# Patient Record
Sex: Male | Born: 1970 | Race: White | Hispanic: No | Marital: Married | Smoking: Former smoker
Health system: Southern US, Community
[De-identification: ages and names within clinical notes are randomized; demographics above are authoritative.]

## PROBLEM LIST (undated history)

## (undated) ENCOUNTER — Emergency Department (HOSPITAL_COMMUNITY): Admission: EM | Payer: Self-pay | Source: Home / Self Care

## (undated) DIAGNOSIS — F32A Depression, unspecified: Secondary | ICD-10-CM

## (undated) DIAGNOSIS — F329 Major depressive disorder, single episode, unspecified: Secondary | ICD-10-CM

## (undated) DIAGNOSIS — Z9861 Coronary angioplasty status: Secondary | ICD-10-CM

## (undated) DIAGNOSIS — I10 Essential (primary) hypertension: Secondary | ICD-10-CM

## (undated) DIAGNOSIS — I214 Non-ST elevation (NSTEMI) myocardial infarction: Secondary | ICD-10-CM

## (undated) DIAGNOSIS — G4730 Sleep apnea, unspecified: Secondary | ICD-10-CM

## (undated) DIAGNOSIS — E669 Obesity, unspecified: Secondary | ICD-10-CM

## (undated) DIAGNOSIS — Z87442 Personal history of urinary calculi: Secondary | ICD-10-CM

## (undated) DIAGNOSIS — R519 Headache, unspecified: Secondary | ICD-10-CM

## (undated) DIAGNOSIS — IMO0002 Reserved for concepts with insufficient information to code with codable children: Secondary | ICD-10-CM

## (undated) DIAGNOSIS — R0683 Snoring: Secondary | ICD-10-CM

## (undated) DIAGNOSIS — J849 Interstitial pulmonary disease, unspecified: Secondary | ICD-10-CM

## (undated) DIAGNOSIS — G2581 Restless legs syndrome: Secondary | ICD-10-CM

## (undated) DIAGNOSIS — I255 Ischemic cardiomyopathy: Secondary | ICD-10-CM

## (undated) DIAGNOSIS — F419 Anxiety disorder, unspecified: Secondary | ICD-10-CM

## (undated) DIAGNOSIS — K219 Gastro-esophageal reflux disease without esophagitis: Secondary | ICD-10-CM

## (undated) DIAGNOSIS — I2510 Atherosclerotic heart disease of native coronary artery without angina pectoris: Secondary | ICD-10-CM

## (undated) DIAGNOSIS — R51 Headache: Secondary | ICD-10-CM

## (undated) DIAGNOSIS — E785 Hyperlipidemia, unspecified: Secondary | ICD-10-CM

## (undated) DIAGNOSIS — M069 Rheumatoid arthritis, unspecified: Secondary | ICD-10-CM

## (undated) HISTORY — DX: Restless legs syndrome: G25.81

## (undated) HISTORY — PX: CHOLECYSTECTOMY: SHX55

## (undated) HISTORY — PX: TRANSTHORACIC ECHOCARDIOGRAM: SHX275

## (undated) HISTORY — DX: Snoring: R06.83

## (undated) HISTORY — PX: THROAT SURGERY: SHX803

## (undated) HISTORY — PX: GALLBLADDER SURGERY: SHX652

---

## 2000-10-25 ENCOUNTER — Encounter: Payer: Self-pay | Admitting: Internal Medicine

## 2000-10-25 ENCOUNTER — Ambulatory Visit (HOSPITAL_COMMUNITY): Admission: RE | Admit: 2000-10-25 | Discharge: 2000-10-25 | Payer: Self-pay | Admitting: Internal Medicine

## 2000-10-27 ENCOUNTER — Encounter: Payer: Self-pay | Admitting: Internal Medicine

## 2000-10-27 ENCOUNTER — Emergency Department (HOSPITAL_COMMUNITY): Admission: EM | Admit: 2000-10-27 | Discharge: 2000-10-27 | Payer: Self-pay | Admitting: *Deleted

## 2000-10-27 ENCOUNTER — Ambulatory Visit (HOSPITAL_COMMUNITY): Admission: RE | Admit: 2000-10-27 | Discharge: 2000-10-27 | Payer: Self-pay | Admitting: Internal Medicine

## 2000-10-28 ENCOUNTER — Ambulatory Visit (HOSPITAL_COMMUNITY): Admission: RE | Admit: 2000-10-28 | Discharge: 2000-10-28 | Payer: Self-pay | Admitting: Internal Medicine

## 2000-10-28 ENCOUNTER — Encounter: Payer: Self-pay | Admitting: Internal Medicine

## 2000-10-31 ENCOUNTER — Ambulatory Visit (HOSPITAL_COMMUNITY): Admission: RE | Admit: 2000-10-31 | Discharge: 2000-10-31 | Payer: Self-pay | Admitting: General Surgery

## 2003-12-02 ENCOUNTER — Ambulatory Visit (HOSPITAL_COMMUNITY): Admission: RE | Admit: 2003-12-02 | Discharge: 2003-12-02 | Payer: Self-pay | Admitting: Family Medicine

## 2003-12-18 ENCOUNTER — Ambulatory Visit (HOSPITAL_COMMUNITY): Admission: RE | Admit: 2003-12-18 | Discharge: 2003-12-18 | Payer: Self-pay | Admitting: Internal Medicine

## 2005-11-06 ENCOUNTER — Emergency Department (HOSPITAL_COMMUNITY): Admission: EM | Admit: 2005-11-06 | Discharge: 2005-11-06 | Payer: Self-pay | Admitting: Emergency Medicine

## 2005-11-09 ENCOUNTER — Emergency Department (HOSPITAL_COMMUNITY): Admission: EM | Admit: 2005-11-09 | Discharge: 2005-11-09 | Payer: Self-pay | Admitting: Emergency Medicine

## 2005-11-12 ENCOUNTER — Ambulatory Visit (HOSPITAL_COMMUNITY): Admission: RE | Admit: 2005-11-12 | Discharge: 2005-11-12 | Payer: Self-pay | Admitting: Urology

## 2006-11-21 ENCOUNTER — Ambulatory Visit (HOSPITAL_COMMUNITY): Admission: RE | Admit: 2006-11-21 | Discharge: 2006-11-21 | Payer: Self-pay | Admitting: Family Medicine

## 2007-02-17 ENCOUNTER — Emergency Department (HOSPITAL_COMMUNITY): Admission: EM | Admit: 2007-02-17 | Discharge: 2007-02-17 | Payer: Self-pay | Admitting: Emergency Medicine

## 2009-09-16 ENCOUNTER — Ambulatory Visit: Payer: Self-pay | Admitting: Orthopedic Surgery

## 2009-09-16 DIAGNOSIS — IMO0002 Reserved for concepts with insufficient information to code with codable children: Secondary | ICD-10-CM

## 2009-09-16 DIAGNOSIS — M549 Dorsalgia, unspecified: Secondary | ICD-10-CM | POA: Insufficient documentation

## 2009-09-16 DIAGNOSIS — M5124 Other intervertebral disc displacement, thoracic region: Secondary | ICD-10-CM | POA: Insufficient documentation

## 2009-09-16 HISTORY — DX: Reserved for concepts with insufficient information to code with codable children: IMO0002

## 2009-09-18 ENCOUNTER — Telehealth: Payer: Self-pay | Admitting: Orthopedic Surgery

## 2009-09-19 ENCOUNTER — Ambulatory Visit (HOSPITAL_COMMUNITY): Admission: RE | Admit: 2009-09-19 | Discharge: 2009-09-19 | Payer: Self-pay | Admitting: Orthopedic Surgery

## 2009-09-22 ENCOUNTER — Encounter: Payer: Self-pay | Admitting: Orthopedic Surgery

## 2009-09-22 ENCOUNTER — Telehealth: Payer: Self-pay | Admitting: Orthopedic Surgery

## 2009-09-24 ENCOUNTER — Ambulatory Visit: Payer: Self-pay | Admitting: Orthopedic Surgery

## 2010-02-14 ENCOUNTER — Encounter: Payer: Self-pay | Admitting: Internal Medicine

## 2010-02-24 NOTE — Assessment & Plan Note (Signed)
Summary: REVIEW MRI RESULTS/APH/PT BRING'G FILM/PCHA/CAF   Visit Type:  Follow-up  CC:  back pain.  History of Present Illness: I saw Richard Simpson in the office today for a followup visit.  He is a 40 years old man with the complaint of:  back pain.  Medications: 1)  Ibuprofen 800 Mg Tabs (Ibuprofen) .... One by mouth three times a day [not taking right now] 2)  Robaxin 500 Mg Tabs (Methocarbamol) .Marland Kitchen.. 1 by mouth at hour of sleep 3)  Ultracet 37.5-325 Mg Tabs (Tramadol-acetaminophen) .... One by mouth q 4 hrs as needed pain   MRI results:  IMPRESSION:   1.  And decreased to size and conspicuity of a right lateral disc protrusion at T11-12. 2.  Stable appearance of Schmorl's nodes throughout the lower thoracic spine. 3.  No other focal disc herniation or stenosis.   Read By:  Chauncey Fischer,  MD      After reviewing this MRI I spoke with this patient regarding his back pain which appears to be nonsurgical.  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 One by mouth tid    Robaxin 500 Mg Tabs (Methocarbamol) .Marland Kitchen... 1 by mouth at hour of sleep    Ultracet 37.5-325 Mg Tabs (Tramadol-acetaminophen) ..... One by mouth q 4 hrs as needed pain  Orders: Est. 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 Patient Level II (98119)  Patient Instructions: 1)  ADD EXERCISES FOR THE BACK  2)  TAKE MEDS AS DISCUSSED F/U AS NEEDED

## 2010-02-24 NOTE — Assessment & Plan Note (Signed)
Summary: EVAL SPINE/?XRAYS/PER DR Lorie Melichar/CAF   Vital Signs:  Patient profile:   40 year old male Height:      73 inches Weight:      245 pounds BMI:     32.44 Pulse rate:   72 / minute  Visit Type:  new patient  CC:  back pain.  History of Present Illness: I saw Richard Simpson in the office today for an initial visit.  He is a 40 years old man with the complaint of:  back pain.  Xrays today.  Medications: Artotec, Ibuprofen 800 mg.  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  The patient did well until about a month and a half ago when he started having gradual onset of constant throbbing sharp pain in his back which he rates as 6/10.  He notes now that he can no longer live weights.  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  He takes Arthrotec for arthritis he'll also supplement this with "a lot of ibuprofen in "as needed to relieve his back pain.    The pain is primarily in the mid to lower thoracic spine and is worsened with flexion as well as extension and he will often feel a pop when he flexes past 90.he has not had any numbness or tingling in his chest wall or abdomen.  he denies weakness.  Allergies (verified): No Known Drug Allergies  Past History:  Past Medical History: Arthritis Back   Past Surgical History: Cholecystectomy Polyps on vocal cord  Review of Systems Constitutional:  Denies weight loss, weight gain, fever, chills, and fatigue. Cardiovascular:  Denies chest pain, palpitations, fainting, and murmurs. Respiratory:  Denies short of breath, wheezing, couch, tightness, pain on inspiration, and snoring . Gastrointestinal:  Denies heartburn, nausea, vomiting, diarrhea, constipation, and blood in your  stools. Genitourinary:  Denies frequency, urgency, difficulty urinating, painful urination, flank pain, and bleeding in urine. Neurologic:  Denies numbness, tingling, unsteady gait, dizziness, tremors, and seizure. Musculoskeletal:  Complains of joint pain, swelling, and stiffness; denies instability, redness, heat, and muscle pain. Endocrine:  Denies excessive thirst, exessive urination, and heat or cold intolerance. Psychiatric:  Denies nervousness, depression, anxiety, and hallucinations. Skin:  Denies changes in the skin, poor healing, rash, itching, and redness. HEENT:  Denies blurred or double vision, eye pain, redness, and watering. Immunology:  Complains of seasonal allergies; denies sinus problems and allergic to bee stings. Hemoatologic:  Denies easy bleeding and brusing.  Physical Exam  Additional Exam:  6 feet 1 inch tall 245 pounds with a pulse of 72  He is a well-developed well-nourished muscular male with no gross deformities  He has no peripheral edema and normal radial and ulnar pulses in each upper extremity  There is no cervical lymphadenopathy  Skin is normal in both upper extremities as well as the head and neck region cervical and thoracic spine.  He has no sensory deficits in the chest wall is normal abdominal reflex.  He has normal upper extremity reflexes.  He has normal lower extremity reflexes.  Toes are downgoing.  There are no pathologic reflexes upper or lower extremities.  He ambulates normally.  In terms of his spinal alignment it seems normal in the sagittal and  coronal plane.  Has no tenderness in his neck has no tenderness in his lower back but he does have tenderness in the lower and mid thoracic spine around the midline RIGHT on the bone and in the spinous spaces.  He has no paraspinal muscle tenderness.  Although he can flex and extend the spine well he has pain with extension as well as popping sensation when he flexes.  He also has pain with  rotation.  He has no pain with lateral flexion.     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 One by mouth tid 2)  Robaxin 500 Mg Tabs (Methocarbamol) .Marland Kitchen.. 1 by mouth at hour of sleep 3)  Ultracet 37.5-325 Mg Tabs (Tramadol-acetaminophen) .... One by mouth q 4 hrs as needed pain  Patient Instructions: 1)  Take new meds 2)  MRI return with results Prescriptions: ULTRACET 37.5-325 MG TABS (TRAMADOL-ACETAMINOPHEN) one by mouth q 4 hrs as needed pain  #60 x 2   Entered and Authorized by:   Fuller Canada MD   Signed by:   Fuller Canada MD on 09/16/2009   Method used:   Faxed to ...       [facility] (retail)       [address]       [place], [place]  [address]       Ph: [phone]       Fax: [phone]   RxID:   [id number] ROBAXIN 500 MG TABS (METHOCARBAMOL) 1 by mouth at hour of sleep  #60 x 1   Entered and Authorized by:   Fuller Canada MD   Signed by:   Fuller Canada MD on 09/16/2009   Method used:   Faxed to ...       [facility] (retail)       [address]       [place], [place]  [address]       Ph: [phone]       Fax: [phone]   RxID:    [id number] IBUPROFEN 800 MG TABS (IBUPROFEN) one by mouth tid  #90 x 2   Entered and Authorized by:   Fuller Canada MD   Signed by:   Fuller Canada MD on 09/16/2009   Method used:   Faxed to ...       [facility] (retail)       [address]       [place], [place]  [address]       Ph: [phone]       Fax: [phone]   RxID:   [id number]

## 2010-02-24 NOTE — Progress Notes (Signed)
Summary: MRI appointment.  Phone Note Outgoing Call   Call placed by: Waldon Reining,  September 18, 2009 2:29 PM Call placed to: Patient Action Taken: Appt scheduled Summary of Call: I called and left a message with the patient's MRI appointment at [facility] on 09-19-09 at 1:30. Patient has [employer], no precert is needed. Patient will follow up here for his results.

## 2010-02-24 NOTE — Letter (Signed)
Summary: History form  Historya form   Imported By: Jacklynn Ganong 09/18/2009 15:30:49  _____________________________________________________________________  External Attachment:    Type:   Image     Comment:   External Document

## 2010-02-24 NOTE — Progress Notes (Signed)
Summary: Work note requested for ret to work today  Phone Note Call from Patient   Caller: Patient Summary of Call: Patient states his employer, [employer], is requesting that he have a work note for today, indicating that he may drive & perform regular duty on the medication prescribed at visit 09/16/09.  States he is having no side effects.  Note typed - please review.  His cell ph#: [phone] Initial call taken by: Cammie Sickle,  September 22, 2009 4:58 PM  Follow-up for Phone Call        Patient states was advised by employer to hold on returning to work on 09/22/09 as scheduled, as needs note.  Follow-up by: Cammie Sickle,  September 23, 2009 10:51 AM  Additional Follow-up for Phone Call Additional follow up Details #1::        ok to give note  Additional Follow-up by: Fuller Canada MD,  September 23, 2009 2:17 PM    Additional Follow-up for Phone Call Additional follow up Details #2::    called patient, advised.  Picking up note today, and will be here as scheduled for appt tomorrow. Follow-up by: Cammie Sickle,  September 23, 2009 3:05 PM

## 2010-02-24 NOTE — Letter (Signed)
Summary: [facility]  [address]  [place], [place] [address]   Phone: [phone]  Fax: [phone]       09/22/2009  RE: Richard Simpson. Cordial     DOB 07/04/2070    Dear Mr. 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      Sincerely,   Terrance Mass, MD

## 2010-06-12 NOTE — Op Note (Signed)
[facility]  Patient:    Richard Simpson, Richard Simpson Visit Number: [id number] MRN: [id number]          Service Type: DSU Location: DAY Attending Physician:  Dalia Heading Dictated by:   Franky Macho, M.D. Proc. Date: 10/31/00 Admit Date:  10/31/2000   CC:         Elfredia Nevins, M.D.  Roetta Sessions, M.D.   Operative Report  AGE:  41 years old.  PREOPERATIVE DIAGNOSIS:  Chronic cholecystitis.  POSTOPERATIVE DIAGNOSIS:  Chronic cholecystitis.  PROCEDURE:  Laparoscopic cholecystectomy.  SURGEON:  Franky Macho, M.D.  ASSISTANT:  Arna Snipe, M.D.  ANESTHESIA:  General endotracheal.  INDICATIONS:  Patient is a 40 year old white male who presents with right upper quadrant abdominal pain, nausea, vomiting, and biliary colic secondary to chronic cholecystitis.  The risks and benefits of the procedure including bleeding, infection, hepatobiliary injury, and the possibility of an open procedure were fully explained to the patient, who gave informed consent.  DESCRIPTION OF PROCEDURE:  Patient was placed in the supine position.  After induction of general endotracheal anesthesia, the abdomen was prepped and draped using the usual sterile technique with Betadine.  A supraumbilical incision was made down to the fascia.  Veress needle was introduced into the abdominal cavity and confirmation of placement was done using the saline drop test.  The abdomen was then insufflated to 16 mmHg pressure.  An 11-mm trocar was introduced into the abdominal cavity under direct visualization without difficulty.  The patient was placed in reversed Trendelenburg position and an additional 11-mm trocar was placed in the epigastric region and 5-mm trocars were placed in the right upper quadrant and right flank regions.  Liver was inspected and noted to be within normal limits.  The gallbladder was retracted superiorly and laterally.  The dissection was begun  around the infundibulum of the gallbladder.  The cystic duct was first identified.  It was noted to be significantly narrowed and tortuous.  Endo Clips were placed proximally and distally on the cystic duct and the cystic duct was divided.  This was likewise done to the cystic artery. The gallbladder was then freed away from the gallbladder fossa using Bovie electrocautery.  The gallbladder was delivered through the epigastric trocar site using an EndoCatch without difficulty.  The gallbladder fossa was inspected and no abnormal bleeding or bile leakage were noted.  Surgicel was placed in the gallbladder fossa.  Subhepatic space as well as right hepatic gutter were irrigated with normal saline.  All fluid and air were then evacuated from the abdominal cavity prior to removal of the trocars.  All wounds were irrigated with normal saline.  All wounds were injected with 0.5% Marcaine.  The supraumbilical fascia was reapproximated using an 0 Vicryl interrupted suture.  All skin incisions were closed using staples.  Betadine ointment and dry sterile dressings were applied.  All tape and needle counts were correct at the end of the procedure.  The patient was extubated in the operating room and went back to recovery room awake, in stable condition.  COMPLICATIONS:  None.  SPECIMEN:  Gallbladder.  BLOOD LOSS:  Minimal. Dictated by:   Franky Macho, M.D. Attending Physician:  Dalia Heading DD:  10/31/00 TD:  11/01/00 Job: [id number]

## 2010-06-12 NOTE — Op Note (Signed)
[facility]  Patient:    Simpson, Richard Visit Number: [id number] MRN: [id number]          Service Type: END Location: DAY Attending Physician:  Jonathon Bellows Dictated by:   Roetta Sessions, M.D. Proc. Date: 10/28/00 Admit Date:  10/28/2000 Discharge Date: 10/28/2000   CC:         Elfredia Nevins, M.D.  Franky Macho, M.D.   Operative Report  PROCEDURE:  Esophagogastroduodenoscopy with biopsy.  ENDOSCOPIST:  Roetta Sessions, M.D.  INDICATION FOR PROCEDURE:  Patient is a 40 year old Caucasian male with a one-week history of right upper quadrant abdominal pain radiating into his back.  He reportedly has had fevers at home to 102.  He has not responded to antibiotics empirically.  Laboratory evaluation essentially unremarkable except for a minimal bandemia with 12 bands, with a total white count of 3.7. Liver profile showed minimal elevation of the transaminases, less than 25 points above normal.  Acute abdominal series, ultrasound, CT of the chest and abdomen as well as HIDA with a fatty meal challenge, all were within normal limits during the past week.  He has localized tenderness overlying his gallbladder on physical examination, although there is no other objective evidence of cholecystitis.  He has been taking some Advil recently for his right upper quadrant pain.  As discussed with Dr. Elfredia Nevins prior to asking a surgeon to see him, we will go ahead and survey his upper GI tract just to make sure he does not have an occult duodenal bulbar ulcer or some other process which may be contributing to the clinical picture.  I have discussed this approach with him and his wife and the potential risks, benefits and alternatives have been reviewed and questions answered; they are agreeable.  Please see my handwritten H&P for more information.  PROCEDURE NOTE:  O2 saturation, blood pressure, pulse and respirations were monitored throughout the  entire procedure.  CONSCIOUS SEDATION:  Versed 4 mg IV, Demerol 100 mg IV in divided doses, cetacaine spray for topical oropharynx anesthesia.  INSTRUMENT:  Olympus video chip gastroscope.  FINDINGS:  Examination of the tubular esophagus revealed no mucosal abnormalities.  The EG junction was easily traversed and on entering the stomach, the gastric cavity had a thin coating of residual CT contrast from yesterday, otherwise, it was empty and insufflated well with air.  Thorough examination of the gastric mucosa including a retroflexed view of the proximal stomach and esophagogastric junction demonstrated no abnormalities aside from a few antral erosions.  The CT contrast was washed off fairly easily.  The mucosa was well-seen.  Pyloric channel was patent and easily traversed.  Duodenum:  Examination of the bulb, second and third portions of the duodenum revealed a couple of 5- to 6-mm adenomatous-appearing nodules on the folds which were biopsied.  The remainder of the duodenum appeared normal.  There was no ulcer.  Subsequently, antral biopsies x 2 for CLOtesting were obtained. The patient tolerated the procedure well and was reactive in endoscopy.  IMPRESSION: 1. Normal esophagus. 2. Antral erosions and residual CT contrast in the stomach.  Remainder of    gastric mucosa appeared normal.  CLO biopsy taken. 3. D1, D2, D3 well seen, a couple of adenomatous-appearing nodules in    D3, otherwise, unremarkable upper gastrointestinal tract, biopsies of the    duodenal nodule taken, CLO biopsies taken.  Todays findings do not explain the patients right upper quadrant pain.  I suspect he does have gallbladder disease.  I have discussed the case with Dr. Franky Macho; he will see Mr. Joshua in the very near future.  If he comes to cholecystectomy, I have asked to Dr. Lovell Sheehan to please do a liver biopsy while he is there.  I would like to thank Dr. Artis Delay for allowing me to see  this very nice man today in consultation. Dictated by:   Roetta Sessions, M.D. Attending Physician:  Jonathon Bellows DD:  10/28/00 TD:  10/29/00 Job: [id number]

## 2010-06-12 NOTE — H&P (Signed)
[facility]  Patient:    Richard Simpson, Richard Simpson Visit Number: [id number] MRN: [id number]          Service Type: END Location: DAY Attending Physician:  Jonathon Bellows Dictated by:   Franky Macho, M.D. Admit Date:  10/28/2000 Discharge Date: 10/28/2000   CC:         Roetta Sessions, M.D.  Elfredia Nevins, M.D.   History and Physical  AGE:  40 years old.  CHIEF COMPLAINT:  Chronic cholecystitis.  HISTORY OF PRESENT ILLNESS:  The patient is a 40 year old white male who is referred for evaluation and treatment of right upper quadrant abdominal pain and nausea.  It has been occurring over the past week.  He has been having right upper quadrant abdominal pain with radiation to the right flank, nausea, bloating, fatty food intolerance, and occasional emesis.  A mild fever was noted earlier in the week, but this has since resolved.  No jaundice has been noted.  He had an ultrasound of the gallbladder which was negative except for some mild thickening of the gallbladder wall.  CT scan of the abdomen and pelvis was negative.  Hepatobiliary scan showed approximately 60% ejection fraction but reproducible pain symptoms with a fatty meal.  EGD done by Dr. Jena Gauss yesterday was, for the most part, unremarkable.  PAST MEDICAL HISTORY:  Unremarkable.  PAST SURGICAL HISTORY: 1. Wisdom teeth removal. 2. Nodules from throat removed as a child.  CURRENT MEDICATIONS:  An antibiotic, Tylox.  ALLERGIES:  No known drug allergies.  SOCIAL HISTORY:  The patient smokes one-half pack of cigarettes a day.  He denies significant alcohol use.  REVIEW OF SYSTEMS:  He denies any other cardiopulmonary difficulties.  PHYSICAL EXAMINATION:  GENERAL:  Well-developed, well-nourished white male in no acute distress.  HEENT:  No scleral icterus.  LUNGS:  Clear to auscultation, with equal breath sounds bilaterally.  HEART:  Regular rate and rhythm without S3, S4, or  murmurs.  ABDOMEN:  Soft, with tenderness noted in the right upper quadrant to palpation.  No hepatosplenomegaly, masses, or hernias are identified.  IMPRESSION:  Biliary colic secondary to chronic cholecystitis.  PLAN:  The patient is scheduled for laparoscopic cholecystectomy on November 10, 2000.  The risks and benefits of the procedure including bleeding, infection, hepatobiliary injury, and the possibility of an open procedure were fully explained to the patient, who gave informed consent. Dictated by:   Franky Macho, M.D. Attending Physician:  Jonathon Bellows DD:  10/28/00 TD:  10/30/00 Job: [id number]

## 2010-09-02 ENCOUNTER — Encounter: Payer: Self-pay | Admitting: Orthopedic Surgery

## 2010-09-02 ENCOUNTER — Ambulatory Visit (INDEPENDENT_AMBULATORY_CARE_PROVIDER_SITE_OTHER): Payer: PRIVATE HEALTH INSURANCE | Admitting: Orthopedic Surgery

## 2010-09-02 ENCOUNTER — Other Ambulatory Visit: Payer: Self-pay

## 2010-09-02 VITALS — Resp 16 | Ht 73.0 in | Wt 258.0 lb

## 2010-09-02 DIAGNOSIS — R2232 Localized swelling, mass and lump, left upper limb: Secondary | ICD-10-CM

## 2010-09-02 DIAGNOSIS — R229 Localized swelling, mass and lump, unspecified: Secondary | ICD-10-CM

## 2010-09-02 NOTE — H&P (Signed)
Richard Simpson is an 40 y.o. male.   Richard Simpson  Description:  40 year old male  09/02/2010 10:30 AM Office Visit Provider:  Fuller Canada, MD  MRN: [id number] Department:  [facility]            Diagnoses  Reason for Visit    Mass of finger of left hand - Primary  Ganglion Cyst   782.2  New problem, left middle finger cyst, xrays today, refered from Dr. Franky Macho.           Current Vitals       Recorded  User         09/02/2010 10:35 AM  Chasity Pricilla Holm, LPN          BP  Pulse  Temp (Src)  Resp  Ht  Wt    N/A  N/A  N/A (N/A)  16  6\' 1"  (1.854 m)  258 lb (117.028 kg)        BMI  SpO2  PF    34.04 kg/m2  N/A  N/A                Progress Notes     Fuller Canada, MD 09/02/2010 10:57 AM Signed  Est. Patient new problem  The patient has a mass on the LEFT long finger. The mass came on gradually over the last 2 years, there was no associated injury. There is sharp dull painful sensations running through the finger associated with 4/10 pain that is constant and worse when he is using his hands are working out. There is swelling as well.  Review of systems he has joint pain swelling and stiffness and seasonal ALLERGIES there's been no weight loss numbness or tingling in the digits. Past history of arthritis and cholecystectomy.  Physical Exam(12)  GENERAL: normal development  CDV: pulses are normal  Skin: normal  Lymph: nodes were not palpable/normal  Psychiatric: awake, alert and oriented  Neuro: normal sensation  MSK Examination of the LEFT hand and specifically the LEFT long finger alignment is normal  1There is no tenderness to palpation, the mass is firm subcutaneous proximally x 1 cm in dimensions  2 Flexion is normal  3 Strength in the finger is normal  4 Normal sensation as noted in the finger  5 The mass is located over the middle phalanx on the ulnar side  6 The joints are stable  Assessment: Ganglion type cyst vs. Synovial cyst  LEFT long finger  X-ray was obtained it showed no bony involvement of soft tissue swelling over the area in question  The patient would like the mass removed because it is causing him pain and difficulty using his hands  Informed consent has been given verbally, he agrees to surgery with minimal risks of infection stiffness numbness and tingling of the digit  Separate identifiable x-ray report 3 views of the LEFT long finger  Reason for x-ray Mass LEFT long finger and pain LEFT long finger  Findings: Soft tissue swelling is noted over the mass no bony involvement arthritis is seen  Impression soft tissue swelling over Mass LEFT long finger with no bony involvement  Plan: Excision of mass LEFT long finger     Past Medical History  Diagnosis Date  . Arthritis     Past Surgical History  Procedure Date  . Gallbladder surgery     Family History  Problem Relation Age of Onset  . Heart disease    . Arthritis    .  Cancer     Social History:  reports that he has never smoked. He does not have any smokeless tobacco history on file. He reports that he does not drink alcohol or use illicit drugs.  Allergies: No Known Allergies  No current outpatient prescriptions on file as of 09/02/2010.   No current facility-administered medications on file as of 09/02/2010.    No results found for this or any previous visit (from the past 48 hour(s)). @[id number] @  ROS  Resp. rate 16, height 6\' 1"  (1.854 m), weight 258 lb (117.028 kg). Physical Exam   Assessment/Plan LLF excision of mass   Fuller Canada 09/02/2010, 11:56 AM

## 2010-09-02 NOTE — Progress Notes (Signed)
   Est. Patient new problem  The patient has a mass on the LEFT long finger.  The mass came on gradually over the last 2 years, there was no associated injury.  There is sharp dull painful sensations running through the finger associated with 4/10 pain that is constant and worse when he is using his hands are working out.  There is swelling as well.  Review of systems he has joint pain swelling and stiffness and seasonal ALLERGIES there's been no weight loss numbness or tingling in the digits.  Past history of arthritis and cholecystectomy.  Physical Exam(12) GENERAL: normal development   CDV: pulses are normal   Skin: normal  Lymph: nodes were not palpable/normal  Psychiatric: awake, alert and oriented  Neuro: normal sensation  MSK Examination of the LEFT hand and specifically the LEFT long finger alignment is normal 1There is no tenderness to palpation, the mass is firm subcutaneous proximally x 1 cm in dimensions 2 Flexion is normal 3 Strength in the finger is normal 4 Normal sensation as noted in the finger 5 The mass is located over the middle phalanx on the ulnar side 6 The joints are stable  Assessment: Ganglion type cyst vs. Synovial cyst LEFT long finger  X-ray was obtained it showed no bony involvement of soft tissue swelling over the area in question  The patient would like the mass removed because it is causing him pain and difficulty using his hands  Informed consent has been given verbally, he agrees to surgery with minimal risks of infection stiffness numbness and tingling of the digit  Separate identifiable x-ray report 3 views of the LEFT long finger Reason for x-ray Mass LEFT long finger and pain LEFT long finger Findings: Soft tissue swelling is noted over the mass no bony involvement arthritis is seen  Impression soft tissue swelling over Mass LEFT long finger with no bony involvement    Plan: Excision of mass LEFT long finger

## 2010-09-02 NOTE — Progress Notes (Signed)
Addended by: Fuller Canada MD E on: 09/02/2010 11:58 AM   Modules accepted: Orders

## 2010-09-03 ENCOUNTER — Telehealth: Payer: Self-pay | Admitting: Orthopedic Surgery

## 2010-09-03 NOTE — Telephone Encounter (Signed)
Contacted insurer, [employer] at ph # [phone], (ins.card shows uses [employer] network). Spoke with Steward Drone, who verified that they are the 3rd party administrator for the [facility] plan.  Planned surgery date is 09/09/10 at [facility], CPT [phone], ICD9 782.2.  Verified with Steward Drone that clinicals are needed for a case review.  Fax# [phone] (done).  Cont'd: 09/02/10  Also per Steward Drone - directed to voice mail of authorization representative at same ph# above.  I left voice mail message with all pertinent information and requested a call back.

## 2010-09-04 ENCOUNTER — Encounter (HOSPITAL_COMMUNITY)
Admission: RE | Admit: 2010-09-04 | Discharge: 2010-09-04 | Disposition: A | Discharge status: Home / Self Care | Payer: PRIVATE HEALTH INSURANCE | Source: Ambulatory Visit | Attending: Orthopedic Surgery | Admitting: Orthopedic Surgery

## 2010-09-04 ENCOUNTER — Encounter (HOSPITAL_COMMUNITY): Payer: Self-pay

## 2010-09-04 LAB — SURGICAL PCR SCREEN
MRSA, PCR: NEGATIVE
Staphylococcus aureus: POSITIVE — AB

## 2010-09-04 LAB — CBC
HCT: 44.3 % (ref 39.0–52.0)
Hemoglobin: 15.5 g/dL (ref 13.0–17.0)
MCH: 31.8 pg (ref 26.0–34.0)
MCHC: 35 g/dL (ref 30.0–36.0)
MCV: 91 fL (ref 78.0–100.0)
RBC: 4.87 MIL/uL (ref 4.22–5.81)
RDW: 12.7 % (ref 11.5–15.5)
WBC: 5.2 10*3/uL (ref 4.0–10.5)

## 2010-09-04 LAB — BASIC METABOLIC PANEL
BUN: 17 mg/dL (ref 6–23)
CO2: 22 mEq/L (ref 19–32)
Chloride: 105 mEq/L (ref 96–112)
GFR calc non Af Amer: >60 mL/min (ref >60)
Glucose, Bld: 114 mg/dL — ABNORMAL HIGH (ref 70–99)
Potassium: 4 mEq/L (ref 3.5–5.1)
Sodium: 141 mEq/L (ref 135–145)

## 2010-09-04 MED ORDER — LACTATED RINGERS IV SOLN: INTRAVENOUS | Status: DC

## 2010-09-04 NOTE — Telephone Encounter (Signed)
Received call back from pre-authorization department, Rowan Blase.  Per her review of clinicals and verification over phone, received authorization # [id number], good for date of service, surgery date only, 09/09/10.

## 2010-09-04 NOTE — Patient Instructions (Signed)
20 Richard Simpson  09/04/2010   Your procedure is scheduled on: 09/09/10  Report to [name] at [facility].  Call this number if you have problems the morning of surgery: [phone]   Remember:   Do not eat food:After Midnight.  Do not drink clear liquids: After Midnight.  Take these medicines the morning of surgery with A SIP OF WATER:    Do not wear jewelry, make-up or nail polish.  Do not wear lotions, powders, or perfumes. You may wear deodorant.  Do not shave 48 hours prior to surgery.  Do not bring valuables to the hospital.  Contacts, dentures or bridgework may not be worn into surgery.  Leave suitcase in the car. After surgery it may be brought to your room.  For patients admitted to the hospital, checkout time is 11:00 AM the day of discharge.   Patients discharged the day of surgery will not be allowed to drive home.  Name and phone number of your driver: family  Special Instructions: CHG Shower Use Special Wash: 1/2 bottle night before surgery and 1/2 bottle morning of surgery.   Please read over the following fact sheets that you were given: Pain Booklet, MRSA Information, Surgical Site Infection Prevention, Anesthesia Post-op Instructions and Care and Recovery After Surgery   PATIENT INSTRUCTIONS POST-ANESTHESIA  IMMEDIATELY FOLLOWING SURGERY:  Do not drive or operate machinery for the first twenty four hours after surgery.  Do not make any important decisions for twenty four hours after surgery or while taking narcotic pain medications or sedatives.  If you develop intractable nausea and vomiting or a severe headache please notify your doctor immediately.  FOLLOW-UP:  Please make an appointment with your surgeon as instructed. You do not need to follow up with anesthesia unless specifically instructed to do so.  WOUND CARE INSTRUCTIONS (if applicable):  Keep a dry clean dressing on the anesthesia/puncture wound site if there is drainage.  Once the wound has quit draining you  may leave it open to air.  Generally you should leave the bandage intact for twenty four hours unless there is drainage.  If the epidural site drains for more than 36-48 hours please call the anesthesia department.  QUESTIONS?:  Please feel free to call your physician or the hospital operator if you have any questions, and they will be happy to assist you.     [facility] Anesthesia Department [address] [place] [phone]

## 2010-09-09 ENCOUNTER — Encounter (HOSPITAL_COMMUNITY): Payer: Self-pay | Admitting: Anesthesiology

## 2010-09-09 ENCOUNTER — Other Ambulatory Visit: Payer: Self-pay | Admitting: Orthopedic Surgery

## 2010-09-09 ENCOUNTER — Encounter (HOSPITAL_COMMUNITY): Payer: Self-pay | Admitting: *Deleted

## 2010-09-09 ENCOUNTER — Ambulatory Visit (HOSPITAL_COMMUNITY): Payer: PRIVATE HEALTH INSURANCE | Admitting: Anesthesiology

## 2010-09-09 ENCOUNTER — Ambulatory Visit (HOSPITAL_COMMUNITY)
Admission: RE | Admit: 2010-09-09 | Discharge: 2010-09-09 | Disposition: A | Discharge status: Home / Self Care | Payer: PRIVATE HEALTH INSURANCE | Source: Ambulatory Visit | Attending: Orthopedic Surgery | Admitting: Orthopedic Surgery

## 2010-09-09 ENCOUNTER — Encounter (HOSPITAL_COMMUNITY)
Admission: RE | Disposition: A | Discharge status: Home / Self Care | Payer: Self-pay | Source: Ambulatory Visit | Attending: Orthopedic Surgery

## 2010-09-09 DIAGNOSIS — R2232 Localized swelling, mass and lump, left upper limb: Secondary | ICD-10-CM

## 2010-09-09 DIAGNOSIS — Z01812 Encounter for preprocedural laboratory examination: Secondary | ICD-10-CM | POA: Insufficient documentation

## 2010-09-09 DIAGNOSIS — D2110 Benign neoplasm of connective and other soft tissue of unspecified upper limb, including shoulder: Secondary | ICD-10-CM

## 2010-09-09 HISTORY — PX: MASS EXCISION: SHX2000

## 2010-09-09 SURGERY — EXCISION MASS
Anesthesia: Regional | Site: Finger | Laterality: Left | Wound class: Clean

## 2010-09-09 MED ORDER — LACTATED RINGERS IV SOLN
INTRAVENOUS | Status: DC
  Administered 2010-09-09: 1000 mL via INTRAVENOUS

## 2010-09-09 MED ORDER — ACETAMINOPHEN 500 MG PO TABS
ORAL_TABLET | ORAL | Status: AC
  Administered 2010-09-09: 500 mg via ORAL
  Filled 2010-09-09: qty 1

## 2010-09-09 MED ORDER — ACETAMINOPHEN 500 MG PO TABS
500.0000 mg | ORAL_TABLET | Freq: Once | ORAL | Status: AC
  Administered 2010-09-09: 500 mg via ORAL

## 2010-09-09 MED ORDER — LIDOCAINE HCL (PF) 0.5 % IJ SOLN
INTRAMUSCULAR | Status: AC
  Filled 2010-09-09: qty 50

## 2010-09-09 MED ORDER — MIDAZOLAM HCL 2 MG/2ML IJ SOLN
INTRAMUSCULAR | Status: AC
  Filled 2010-09-09: qty 2

## 2010-09-09 MED ORDER — MIDAZOLAM HCL 5 MG/5ML IJ SOLN
INTRAMUSCULAR | Status: DC | PRN
  Administered 2010-09-09: 2 mg via INTRAVENOUS

## 2010-09-09 MED ORDER — CELECOXIB 100 MG PO CAPS
400.0000 mg | ORAL_CAPSULE | Freq: Once | ORAL | Status: AC
  Administered 2010-09-09: 400 mg via ORAL

## 2010-09-09 MED ORDER — FENTANYL CITRATE 0.05 MG/ML IJ SOLN
INTRAMUSCULAR | Status: AC
  Filled 2010-09-09: qty 2

## 2010-09-09 MED ORDER — MIDAZOLAM HCL 2 MG/2ML IJ SOLN
1.0000 mg | INTRAMUSCULAR | Status: DC | PRN
  Administered 2010-09-09: 2 mg via INTRAVENOUS

## 2010-09-09 MED ORDER — CEFAZOLIN SODIUM 1-5 GM-% IV SOLN
INTRAVENOUS | Status: DC | PRN
  Administered 2010-09-09: 2 g via INTRAVENOUS

## 2010-09-09 MED ORDER — CEFAZOLIN SODIUM-DEXTROSE 2-3 GM-% IV SOLR: 2.0000 g | INTRAVENOUS | Status: DC

## 2010-09-09 MED ORDER — LIDOCAINE HCL (PF) 0.5 % IJ SOLN
INTRAMUSCULAR | Status: DC | PRN
  Administered 2010-09-09: 50 mL

## 2010-09-09 MED ORDER — LIDOCAINE HCL (PF) 1 % IJ SOLN
INTRAMUSCULAR | Status: AC
  Filled 2010-09-09: qty 5

## 2010-09-09 MED ORDER — HYDROCODONE-ACETAMINOPHEN 5-325 MG PO TABS
ORAL_TABLET | ORAL | Status: AC
  Administered 2010-09-09: 1 via ORAL
  Filled 2010-09-09: qty 1

## 2010-09-09 MED ORDER — BUPIVACAINE HCL (PF) 0.5 % IJ SOLN
INTRAMUSCULAR | Status: AC
  Filled 2010-09-09: qty 30

## 2010-09-09 MED ORDER — PROPOFOL 10 MG/ML IV EMUL
INTRAVENOUS | Status: AC
  Filled 2010-09-09: qty 20

## 2010-09-09 MED ORDER — FENTANYL CITRATE 0.05 MG/ML IJ SOLN
INTRAMUSCULAR | Status: DC | PRN
  Administered 2010-09-09 (×2): 50 ug via INTRAVENOUS

## 2010-09-09 MED ORDER — BUPIVACAINE HCL (PF) 0.5 % IJ SOLN
INTRAMUSCULAR | Status: DC | PRN
  Administered 2010-09-09: 10 mL

## 2010-09-09 MED ORDER — HYDROCODONE-ACETAMINOPHEN 5-325 MG PO TABS
1.0000 | ORAL_TABLET | Freq: Once | ORAL | Status: AC
  Administered 2010-09-09: 1 via ORAL

## 2010-09-09 MED ORDER — MIDAZOLAM HCL 2 MG/2ML IJ SOLN
INTRAMUSCULAR | Status: AC
  Administered 2010-09-09: 2 mg via INTRAVENOUS
  Filled 2010-09-09: qty 2

## 2010-09-09 MED ORDER — CELECOXIB 100 MG PO CAPS
ORAL_CAPSULE | ORAL | Status: AC
  Administered 2010-09-09: 400 mg via ORAL
  Filled 2010-09-09: qty 4

## 2010-09-09 MED ORDER — PROPOFOL 10 MG/ML IV EMUL
INTRAVENOUS | Status: DC | PRN
  Administered 2010-09-09: 25 ug/kg/min via INTRAVENOUS

## 2010-09-09 MED ORDER — HYDROCODONE-ACETAMINOPHEN 5-325 MG PO TABS: 1.0000 | ORAL_TABLET | ORAL | Status: AC | PRN

## 2010-09-09 MED ORDER — SODIUM CHLORIDE 0.9 % IR SOLN
Status: DC | PRN
  Administered 2010-09-09: 1000 mL

## 2010-09-09 MED ORDER — CEFAZOLIN SODIUM 1-5 GM-% IV SOLN
INTRAVENOUS | Status: AC
  Filled 2010-09-09: qty 100

## 2010-09-09 SURGICAL SUPPLY — 46 items
BAG HAMPER (MISCELLANEOUS) ×2 IMPLANT
BANDAGE CONFORM 2X5YD N/S (GAUZE/BANDAGES/DRESSINGS) ×1 IMPLANT
BANDAGE ELASTIC 2 VELCRO NS LF (GAUZE/BANDAGES/DRESSINGS) ×1 IMPLANT
BANDAGE ELASTIC 3 VELCRO ST LF (GAUZE/BANDAGES/DRESSINGS) ×1 IMPLANT
BANDAGE ESMARK 4X12 BL STRL LF (DISPOSABLE) IMPLANT
BANDAGE GAUZE ELAST BULKY 4 IN (GAUZE/BANDAGES/DRESSINGS) ×1 IMPLANT
BLADE SURG 15 STRL LF DISP TIS (BLADE) ×1 IMPLANT
BLADE SURG 15 STRL SS (BLADE) ×2
BNDG CMPR 12X4 ELC STRL LF (DISPOSABLE) ×1
BNDG COHESIVE 4X5 TAN NS LF (GAUZE/BANDAGES/DRESSINGS) ×1 IMPLANT
BNDG ESMARK 4X12 BLUE STRL LF (DISPOSABLE) ×2
CHLORAPREP W/TINT 26ML (MISCELLANEOUS) ×2 IMPLANT
CLOTH BEACON ORANGE TIMEOUT ST (SAFETY) ×2 IMPLANT
COVER LIGHT HANDLE STERIS (MISCELLANEOUS) ×4 IMPLANT
CUFF TOURNIQUET SINGLE 18IN (TOURNIQUET CUFF) ×2 IMPLANT
DECANTER SPIKE VIAL GLASS SM (MISCELLANEOUS) ×1 IMPLANT
DRSG XEROFORM 1X8 (GAUZE/BANDAGES/DRESSINGS) ×2 IMPLANT
ELECT NDL TIP 2.8 STRL (NEEDLE) ×1 IMPLANT
ELECT NEEDLE TIP 2.8 STRL (NEEDLE) ×2 IMPLANT
ELECT REM PT RETURN 9FT ADLT (ELECTROSURGICAL) ×2
ELECTRODE REM PT RTRN 9FT ADLT (ELECTROSURGICAL) ×1 IMPLANT
FORMALIN 10 PREFIL 120ML (MISCELLANEOUS) ×2 IMPLANT
GLOVE ECLIPSE 7.0 STRL STRAW (GLOVE) ×1 IMPLANT
GLOVE INDICATOR 7.5 STRL GRN (GLOVE) ×1 IMPLANT
GLOVE SKINSENSE NS SZ8.0 LF (GLOVE) ×1
GLOVE SKINSENSE STRL SZ8.0 LF (GLOVE) ×1 IMPLANT
GLOVE SS N UNI LF 8.5 STRL (GLOVE) ×2 IMPLANT
GOWN BRE IMP SLV AUR XL STRL (GOWN DISPOSABLE) ×2 IMPLANT
GOWN STRL REIN XL XLG (GOWN DISPOSABLE) ×2 IMPLANT
HAND ALUMI LG (SOFTGOODS) ×1 IMPLANT
HAND ALUMI XLG (SOFTGOODS) IMPLANT
KIT ROOM TURNOVER APOR (KITS) ×2 IMPLANT
MANIFOLD NEPTUNE II (INSTRUMENTS) ×2 IMPLANT
NDL HYPO 21X1.5 SAFETY (NEEDLE) ×1 IMPLANT
NEEDLE HYPO 21X1.5 SAFETY (NEEDLE) ×2 IMPLANT
NS IRRIG 1000ML POUR BTL (IV SOLUTION) ×2 IMPLANT
PACK BASIC LIMB (CUSTOM PROCEDURE TRAY) ×2 IMPLANT
PAD ARMBOARD 7.5X6 YLW CONV (MISCELLANEOUS) ×2 IMPLANT
SET BASIN LINEN APH (SET/KITS/TRAYS/PACK) ×2 IMPLANT
SPONGE GAUZE 4X4 12PLY (GAUZE/BANDAGES/DRESSINGS) ×2 IMPLANT
STRIP CLOSURE SKIN 1/2X4 (GAUZE/BANDAGES/DRESSINGS) ×1 IMPLANT
SUT ETHILON 3 0 FSL (SUTURE) ×2 IMPLANT
SUT MON AB 2-0 SH 27 (SUTURE)
SUT MON AB 2-0 SH27 (SUTURE) ×1 IMPLANT
SUT PROLENE 3 0 PS 2 (SUTURE) ×1 IMPLANT
SYR CONTROL 10ML LL (SYRINGE) ×2 IMPLANT

## 2010-09-09 NOTE — Anesthesia Preprocedure Evaluation (Addendum)
Anesthesia Evaluation  Name, MR# and DOB Patient awake  General Assessment Comment  Reviewed: Allergy & Precautions, H&P , NPO status , Patient's Chart, lab work & pertinent test results  History of Anesthesia Complications Negative for: history of anesthetic complications  Airway Mallampati: II  Neck ROM: Full    Dental  (+) Teeth Intact   Pulmonary    pulmonary exam normalPulmonary Exam Normal     Cardiovascular Regular Normal    Neuro/Psych   GI/Hepatic/Renal   Endo/Other    Abdominal   Musculoskeletal   Hematology   Peds  Reproductive/Obstetrics    Anesthesia Other Findings             Anesthesia Physical Anesthesia Plan  ASA: II  Anesthesia Plan: Regional   Post-op Pain Management:    Induction:   Airway Management Planned: Nasal Cannula  Additional Equipment:   Intra-op Plan:   Post-operative Plan:   Informed Consent: I have reviewed the patients History and Physical, chart, labs and discussed the procedure including the risks, benefits and alternatives for the proposed anesthesia with the patient or authorized representative who has indicated his/her understanding and acceptance.     Plan Discussed with: CRNA  Anesthesia Plan Comments:         Anesthesia Quick Evaluation

## 2010-09-09 NOTE — Transfer of Care (Signed)
Immediate Anesthesia Transfer of Care Note  Patient: Richard Simpson  Procedure(s) Performed:  EXCISION MASS - Excision Mass Left Long Finger  Patient Location: PACU  Anesthesia Type: Regional  Level of Consciousness: awake and alert   Airway & Oxygen Therapy: Patient Spontanous Breathing and Patient connected to face mask oxygen  Post-op Assessment: Report given to PACU RN, Post -op Vital signs reviewed and stable and Patient moving all extremities  Post vital signs: Reviewed and stable  Complications: No apparent anesthesia complications

## 2010-09-09 NOTE — Brief Op Note (Signed)
09/09/2010  8:26 AM  PATIENT:  Wallace Going  40 y.o. male  PRE-OPERATIVE DIAGNOSIS:  mass left long finger  POST-OPERATIVE DIAGNOSIS:  * No post-op diagnosis entered *  PROCEDURE:  Procedure(s): EXCISION MASS  SURGEON:  Surgeon(s): Fuller Canada, MD  PHYSICIAN ASSISTANT:   ASSISTANTS: Lucrezia Europe   ANESTHESIA:   regional  ESTIMATED BLOOD LOSS: * No blood loss amount entered *   BLOOD ADMINISTERED:none  DRAINS: none   LOCAL MEDICATIONS USED:  MARCAINE 10CC  SPECIMEN:  Source of Specimen:  left long finger  DISPOSITION OF SPECIMEN:  PATHOLOGY  COUNTS:  YES  TOURNIQUET:   Total Tourniquet Time Documented: Upper Arm (Left) - 31 minutes  DICTATION #:   PLAN OF CARE: pacu then home   PATIENT DISPOSITION:  PACU - hemodynamically stable.   Delay start of Pharmacological VTE agent (>24hrs) due to surgical blood loss or risk of bleeding:  not applicable

## 2010-09-09 NOTE — Addendum Note (Signed)
Addendum  created 09/09/10 1003 by Marylene Buerger, CRNA   Modules edited:Anesthesia LDA, Anesthesia Medication Administration

## 2010-09-09 NOTE — Anesthesia Procedure Notes (Signed)
Regional Block:  Bier block (IV Regional)  Pre-Anesthetic Checklist: ,, timeout performed, Correct Patient, Correct Site, Correct Laterality, Correct Procedure, Correct Position, site marked, Risks and benefits discussed, Surgical consent,  Laterality: Left     Bier block (IV Regional) Narrative:  Start time: 09/09/2010 7:49 AM  Performed by: Personally   Additional Notes: 50 Ml Lidocaine

## 2010-09-09 NOTE — Op Note (Signed)
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 Firm.  After site marking and chart update the patient was taken to surgery for regional block. Antibiotics were started. The arm was prepped and draped from the fingers to the elbow.  After the timeout a longitudinal incision was made over the mass subcutaneous tissue was divided and the mass was bluntly dissected and removed. The mass shelled out very easily. He seemed to not have a capsule. It seemed to have pushed the anatomy only from it.  The wound was irrigated and closed with 6 interrupted 3-0 nylon sutures  We then injected 5 cc of plain Marcaine on each side of the digit for digital block and then applied sterile dressing. The tourniquet was released fingers were viable the patient was taken recovery room in stable condition  Sutures out in 7-10 days depending on appearance  Discharged with 20 Norco.

## 2010-09-09 NOTE — Interval H&P Note (Signed)
History and Physical Interval Note:   09/09/2010   7:22 AM   Richard Simpson  has presented today for surgery, with the diagnosis of mass left long finger  The various methods of treatment have been discussed with the patient and family. After consideration of risks, benefits and other options for treatment, the patient has consented to  Procedure(s):LEFT LONG FINGER EXCISION MASS as a surgical intervention .  I have reviewed the patients' chart and labs.  Questions were answered to the patient's satisfaction.     Fuller Canada  MD

## 2010-09-09 NOTE — Anesthesia Postprocedure Evaluation (Signed)
  Anesthesia Post-op Note  Patient: Richard Simpson  Procedure(s) Performed:  EXCISION MASS - Excision Mass Left Long Finger  Patient Location: PACU  Anesthesia Type: Regional  Level of Consciousness: awake and alert   Airway and Oxygen Therapy: Patient Spontanous Breathing and Patient connected to face mask oxygen  Post-op Pain: none  Post-op Assessment: Post-op Vital signs reviewed, Patient's Cardiovascular Status Stable and Respiratory Function Stable  Post-op Vital Signs: Reviewed and stable  Complications: No apparent anesthesia complications

## 2010-09-09 NOTE — H&P (View-Only) (Signed)
Richard Simpson is an 40 y.o. male.   Loudon Marohl  Description:  40 year old male  09/02/2010 10:30 AM Office Visit Provider:  Lander Eslick, MD  MRN: [id number] Department:  [facility]            Diagnoses  Reason for Visit    Mass of finger of left hand - Primary  Ganglion Cyst   782.2  New problem, left middle finger cyst, xrays today, refered from Dr. Mark Jenkins.           Current Vitals       Recorded  User         09/02/2010 10:35 AM  Chasity Tucker, LPN          BP  Pulse  Temp (Src)  Resp  Ht  Wt    N/A  N/A  N/A (N/A)  16  6' 1" (1.854 m)  258 lb (117.028 kg)        BMI  SpO2  PF    34.04 kg/m2  N/A  N/A                Progress Notes     Richard Ishman, MD 09/02/2010 10:57 AM Signed  Est. Patient new problem  The patient has a mass on the LEFT long finger. The mass came on gradually over the last 2 years, there was no associated injury. There is sharp dull painful sensations running through the finger associated with 4/10 pain that is constant and worse when he is using his hands are working out. There is swelling as well.  Review of systems he has joint pain swelling and stiffness and seasonal ALLERGIES there's been no weight loss numbness or tingling in the digits. Past history of arthritis and cholecystectomy.  Physical Exam(12)  GENERAL: normal development  CDV: pulses are normal  Skin: normal  Lymph: nodes were not palpable/normal  Psychiatric: awake, alert and oriented  Neuro: normal sensation  MSK Examination of the LEFT hand and specifically the LEFT long finger alignment is normal  1There is no tenderness to palpation, the mass is firm subcutaneous proximally 5Millimeters x 1 cm in dimensions  2 Flexion is normal  3 Strength in the finger is normal  4 Normal sensation as noted in the finger  5 The mass is located over the middle phalanx on the ulnar side  6 The joints are stable  Assessment: Ganglion type cyst vs. Synovial cyst  LEFT long finger  X-ray was obtained it showed no bony involvement of soft tissue swelling over the area in question  The patient would like the mass removed because it is causing him pain and difficulty using his hands  Informed consent has been given verbally, he agrees to surgery with minimal risks of infection stiffness numbness and tingling of the digit  Separate identifiable x-ray report 3 views of the LEFT long finger  Reason for x-ray Mass LEFT long finger and pain LEFT long finger  Findings: Soft tissue swelling is noted over the mass no bony involvement arthritis is seen  Impression soft tissue swelling over Mass LEFT long finger with no bony involvement  Plan: Excision of mass LEFT long finger     Past Medical History  Diagnosis Date  . Arthritis     Past Surgical History  Procedure Date  . Gallbladder surgery     Family History  Problem Relation Age of Onset  . Heart disease    . Arthritis    .   Cancer     Social History:  reports that he has never smoked. He does not have any smokeless tobacco history on file. He reports that he does not drink alcohol or use illicit drugs.  Allergies: No Known Allergies  No current outpatient prescriptions on file as of 09/02/2010.   No current facility-administered medications on file as of 09/02/2010.    No results found for this or any previous visit (from the past 48 hour(s)). @RISRSLT48@  ROS  Resp. rate 16, height 6' 1" (1.854 m), weight 258 lb (117.028 kg). Physical Exam   Assessment/Plan LLF excision of mass   Richard Simpson 09/02/2010, 11:56 AM    

## 2010-09-10 ENCOUNTER — Telehealth: Payer: Self-pay | Admitting: Orthopedic Surgery

## 2010-09-10 NOTE — Telephone Encounter (Signed)
Yes we numbed them with a medication just before the end of the surgery

## 2010-09-10 NOTE — Telephone Encounter (Signed)
Advised the patient of doctor's reply/bsf

## 2010-09-10 NOTE — Telephone Encounter (Signed)
Mr. Pavek says he has 3 fingers that are still numb from his surgery yesterday, asking if this is normal. Cell # [phone]

## 2010-09-10 NOTE — OR Nursing (Signed)
Added wound class to chart-C.PageRN

## 2010-09-14 ENCOUNTER — Ambulatory Visit (INDEPENDENT_AMBULATORY_CARE_PROVIDER_SITE_OTHER): Payer: PRIVATE HEALTH INSURANCE | Admitting: Orthopedic Surgery

## 2010-09-14 ENCOUNTER — Encounter: Payer: Self-pay | Admitting: Orthopedic Surgery

## 2010-09-14 DIAGNOSIS — R229 Localized swelling, mass and lump, unspecified: Secondary | ICD-10-CM

## 2010-09-14 DIAGNOSIS — R2232 Localized swelling, mass and lump, left upper limb: Secondary | ICD-10-CM

## 2010-09-14 NOTE — Progress Notes (Signed)
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

## 2010-09-14 NOTE — Patient Instructions (Signed)
Work stay out until after sutures are out: Monday

## 2010-09-15 ENCOUNTER — Encounter (HOSPITAL_COMMUNITY): Payer: Self-pay | Admitting: Orthopedic Surgery

## 2010-09-21 ENCOUNTER — Telehealth: Payer: Self-pay | Admitting: *Deleted

## 2010-09-21 NOTE — Telephone Encounter (Signed)
Nurse visit today to remove stitches from left long finger, DOS 09/09/10 excision of mass left long finger, POD 12, patient tolerated stitch removal well, incision looked good, no redness, drainage, or swelling, patient still has some numbness in tip of operative finger, applied bandaid and advised to keep covered until healed, patient will follow up in 2 weeks for incision check with the Dr.

## 2010-10-06 ENCOUNTER — Ambulatory Visit: Payer: PRIVATE HEALTH INSURANCE | Admitting: Orthopedic Surgery

## 2011-06-23 ENCOUNTER — Emergency Department (HOSPITAL_COMMUNITY): Payer: PRIVATE HEALTH INSURANCE

## 2011-06-23 ENCOUNTER — Encounter (HOSPITAL_COMMUNITY): Payer: Self-pay | Admitting: *Deleted

## 2011-06-23 ENCOUNTER — Emergency Department (HOSPITAL_COMMUNITY)
Admission: EM | Admit: 2011-06-23 | Discharge: 2011-06-23 | Disposition: A | Discharge status: Home / Self Care | Payer: PRIVATE HEALTH INSURANCE | Attending: Emergency Medicine | Admitting: Emergency Medicine

## 2011-06-23 DIAGNOSIS — Z79899 Other long term (current) drug therapy: Secondary | ICD-10-CM | POA: Insufficient documentation

## 2011-06-23 DIAGNOSIS — N200 Calculus of kidney: Secondary | ICD-10-CM | POA: Insufficient documentation

## 2011-06-23 DIAGNOSIS — M129 Arthropathy, unspecified: Secondary | ICD-10-CM | POA: Insufficient documentation

## 2011-06-23 DIAGNOSIS — R109 Unspecified abdominal pain: Secondary | ICD-10-CM | POA: Insufficient documentation

## 2011-06-23 DIAGNOSIS — N201 Calculus of ureter: Secondary | ICD-10-CM | POA: Insufficient documentation

## 2011-06-23 DIAGNOSIS — N1330 Unspecified hydronephrosis: Secondary | ICD-10-CM | POA: Insufficient documentation

## 2011-06-23 DIAGNOSIS — N3943 Post-void dribbling: Secondary | ICD-10-CM | POA: Insufficient documentation

## 2011-06-23 DIAGNOSIS — R3915 Urgency of urination: Secondary | ICD-10-CM | POA: Insufficient documentation

## 2011-06-23 DIAGNOSIS — R350 Frequency of micturition: Secondary | ICD-10-CM | POA: Insufficient documentation

## 2011-06-23 LAB — URINALYSIS, ROUTINE W REFLEX MICROSCOPIC
Ketones, ur: NEGATIVE mg/dL
Leukocytes, UA: NEGATIVE
Nitrite: NEGATIVE
Protein, ur: 30 mg/dL — AB
Urobilinogen, UA: 0.2 mg/dL (ref 0.0–1.0)
pH: 5.5 (ref 5.0–8.0)

## 2011-06-23 LAB — URINE MICROSCOPIC-ADD ON

## 2011-06-23 MED ORDER — HYDROMORPHONE HCL PF 1 MG/ML IJ SOLN
1.0000 mg | Freq: Once | INTRAMUSCULAR | Status: AC
  Administered 2011-06-23: 1 mg via INTRAVENOUS

## 2011-06-23 MED ORDER — ONDANSETRON HCL 4 MG/2ML IJ SOLN
4.0000 mg | Freq: Once | INTRAMUSCULAR | Status: AC
  Administered 2011-06-23: 4 mg via INTRAVENOUS

## 2011-06-23 MED ORDER — OXYCODONE-ACETAMINOPHEN 5-325 MG PO TABS
1.0000 | ORAL_TABLET | Freq: Four times a day (QID) | ORAL | Status: DC | PRN
  Administered 2011-06-23: 1 via ORAL
  Filled 2011-06-23: qty 1

## 2011-06-23 MED ORDER — PERCOCET 5-325 MG PO TABS: 1.0000 | ORAL_TABLET | Freq: Four times a day (QID) | ORAL | Status: AC | PRN

## 2011-06-23 MED ORDER — KETOROLAC TROMETHAMINE 30 MG/ML IJ SOLN
30.0000 mg | Freq: Once | INTRAMUSCULAR | Status: AC
  Administered 2011-06-23: 30 mg via INTRAVENOUS
  Filled 2011-06-23: qty 1

## 2011-06-23 MED ORDER — HYDROMORPHONE HCL PF 1 MG/ML IJ SOLN
INTRAMUSCULAR | Status: AC
  Filled 2011-06-23: qty 1

## 2011-06-23 MED ORDER — KETOROLAC TROMETHAMINE 10 MG PO TABS: ORAL_TABLET | ORAL | Status: DC

## 2011-06-23 MED ORDER — TAMSULOSIN HCL 0.4 MG PO CAPS: 0.4000 mg | ORAL_CAPSULE | Freq: Every day | ORAL | Status: DC

## 2011-06-23 MED ORDER — PROMETHAZINE HCL 25 MG PO TABS: 25.0000 mg | ORAL_TABLET | Freq: Four times a day (QID) | ORAL | Status: DC | PRN

## 2011-06-23 MED ORDER — HYDROMORPHONE HCL PF 1 MG/ML IJ SOLN
1.0000 mg | Freq: Once | INTRAMUSCULAR | Status: AC
  Administered 2011-06-23: 1 mg via INTRAVENOUS
  Filled 2011-06-23: qty 1

## 2011-06-23 MED ORDER — ONDANSETRON HCL 4 MG/5ML PO SOLN
4.0000 mg | Freq: Once | ORAL | Status: DC
  Filled 2011-06-23: qty 1

## 2011-06-23 NOTE — ED Provider Notes (Signed)
History     CSN: [id number]  Arrival date & time 06/23/11  1730   First MD Initiated Contact with Patient 06/23/11 1817      Chief Complaint  Patient presents with  . Flank Pain    (Consider location/radiation/quality/duration/timing/severity/associated sxs/prior treatment) HPI  PT relates pain in his testicle (left) without swelling for a few days, but about 2 hours ago he had acute onset of left flank pain. He has had frequency and urgency with dribbling and now feels like he can't urinate. He denies nausea or vomiting. Has had a renal stone once before. He denies fever.   PCP Dr Phillips [facility] Dr Rito Ehrlich  Past Medical History  Diagnosis Date  . Arthritis   . Headache     Past Surgical History  Procedure Date  . Gallbladder surgery   . Throat surgery     nodules removed from throat as child  . Cholecystectomy     10 yrs ago-jenkins  . Mass excision 09/09/2010    Procedure: EXCISION MASS;  Surgeon: Fuller Canada, MD;  Location: [facility];  Service: Orthopedics;  Laterality: Left;  Excision Mass Left Long Finger    Family History  Problem Relation Age of Onset  . Heart disease    . Arthritis    . Cancer    . Anesthesia problems Neg Hx   . Hypotension Neg Hx   . Malignant hyperthermia Neg Hx   . Pseudochol deficiency Neg Hx     History  Substance Use Topics  . Smoking status: Former Smoker -- 0.2 packs/day    Types: Cigarettes    Quit date: 09/04/2003  . Smokeless tobacco: Current User    Types: Chew  . Alcohol Use: No  lives with spouse Employed as Emergency planning/management officer    Review of Systems  All other systems reviewed and are negative.    Allergies  Review of patient's allergies indicates no known allergies.  Home Medications   Current Outpatient Rx  Name Route Sig Dispense Refill  . ACETAMINOPHEN 500 MG PO TABS Oral Take 1,000 mg by mouth every 6 (six) hours as needed. For pain     . ARTHROTEC PO Oral Take 75 mg by mouth 2 (two) times daily.       Marland Kitchen LORATADINE 10 MG PO TABS Oral Take 10 mg by mouth daily as needed. For allergies     . OXYMETAZOLINE HCL 0.05 % NA SOLN Nasal Place 2 sprays into the nose 2 (two) times daily as needed. For congestion       BP 128/91  Pulse 68  Temp(Src) 98.1 F (36.7 C) (Oral)  Resp 20  Ht 6\' 1"  (1.854 m)  Wt 250 lb (113.399 kg)  BMI 32.98 kg/m2  SpO2 96%  Vital signs normal    Physical Exam  Constitutional: He is oriented to person, place, and time. He appears well-developed and well-nourished.  Non-toxic appearance. He does not appear ill. No distress.  HENT:  Head: Normocephalic and atraumatic.  Right Ear: External ear normal.  Left Ear: External ear normal.  Nose: Nose normal. No mucosal edema or rhinorrhea.  Mouth/Throat: Oropharynx is clear and moist and mucous membranes are normal. No dental abscesses or uvula swelling.  Eyes: Conjunctivae and EOM are normal. Pupils are equal, round, and reactive to light.  Neck: Normal range of motion and full passive range of motion without pain. Neck supple.  Cardiovascular: Normal rate, regular rhythm and normal heart sounds.  Exam reveals no gallop and no  friction rub.   No murmur heard. Pulmonary/Chest: Effort normal and breath sounds normal. No respiratory distress. He has no wheezes. He has no rhonchi. He has no rales. He exhibits no tenderness and no crepitus.  Abdominal: Soft. Normal appearance and bowel sounds are normal. He exhibits no distension. There is no tenderness. There is no rebound and no guarding.       Left flank tenderness  Musculoskeletal: Normal range of motion. He exhibits no edema and no tenderness.       Moves all extremities well.   Neurological: He is alert and oriented to person, place, and time. He has normal strength. No cranial nerve deficit.  Skin: Skin is warm, dry and intact. No rash noted. No erythema. No pallor.  Psychiatric: He has a normal mood and affect. His speech is normal and behavior is normal. His mood  appears not anxious.    ED Course  Procedures (including critical care time)   Medications  oxyCODONE-acetaminophen (PERCOCET) 5-325 MG per tablet 1 tablet (1 tablet Oral Given 06/23/11 2205)  HYDROmorphone (DILAUDID) injection 1 mg (1 mg Intravenous Given 06/23/11 1828)  ondansetron (ZOFRAN) injection 4 mg (4 mg Intravenous Given 06/23/11 1829)  HYDROmorphone (DILAUDID) injection 1 mg (1 mg Intravenous Given 06/23/11 1851)  ketorolac (TORADOL) 30 MG/ML injection 30 mg (30 mg Intravenous Given 06/23/11 1957)    Pt feeling better  Results for orders placed during the hospital encounter of 06/23/11  URINALYSIS, ROUTINE W REFLEX MICROSCOPIC      Component Value Range   Color, Urine BROWN (*) YELLOW    APPearance CLEAR  CLEAR    Specific Gravity, Urine >1.030 (*) 1.005 - 1.030    pH 5.5  5.0 - 8.0    Glucose, UA NEGATIVE  NEGATIVE (mg/dL)   Hgb urine dipstick LARGE (*) NEGATIVE    Bilirubin Urine SMALL (*) NEGATIVE    Ketones, ur NEGATIVE  NEGATIVE (mg/dL)   Protein, ur 30 (*) NEGATIVE (mg/dL)   Urobilinogen, UA 0.2  0.0 - 1.0 (mg/dL)   Nitrite NEGATIVE  NEGATIVE    Leukocytes, UA NEGATIVE  NEGATIVE   URINE MICROSCOPIC-ADD ON      Component Value Range   WBC, UA 0-2  <3 (WBC/hpf)   RBC / HPF TOO NUMEROUS TO COUNT  <3 (RBC/hpf)   Bacteria, UA MANY (*) RARE    Crystals CA OXALATE CRYSTALS (*) NEGATIVE    Urine-Other MUCOUS PRESENT     Laboratory interpretation all normal except hematuria, crystals   Ct Abdomen Pelvis Wo Contrast  06/23/2011  *RADIOLOGY REPORT*  Clinical Data: Right-sided flank pain.  Status post cholecystectomy.  CT ABDOMEN AND PELVIS WITHOUT CONTRAST  Technique:  Multidetector CT imaging of the abdomen and pelvis was performed following the standard protocol without intravenous contrast.  Comparison: CT abdomen and pelvis 11/06/2005.  Findings: Mild dependent atelectasis is present.  The heart size is normal.  The lungs are clear.  No significant pleural or  pericardial effusion is present.  The liver and spleen are unremarkable.  The stomach, duodenum, and pancreas are within normal limits.  The patient is status post cholecystectomy.  The common bile duct is unremarkable.  The adrenal glands are normal bilaterally. The right kidney and stone is within normal limits.  A punctate nonobstructive stone is seen near the lower pole of the right kidney.  Mild hydronephrosis present.  The left ureter is dilated throughout its course to the anatomic pelvis where a 3 mm distal obstructing stone is present  just above the uretero-vesicle junction.  There is inflammatory stranding about the ureter at this level.  The rectosigmoid colon is within normal limits.  The remainder of the colon is within normal limits as well.  The appendix is visualized and normal. The small bowel is unremarkable.  The urinary bladder is collapsed.  Bone windows demonstrate multiple endplate Schmorl's nodes without significant kyphosis.  IMPRESSION:  1.  Obstructing distal left ureteral 3 mm stone, just above the left uretero-vesicle junction. 2.  Mild left-sided hydronephrosis. 3.  Nonobstructing punctate stone at the lower pole of the left kidney. 4.  The right kidney and ureter are negative. 5.  Status post cholecystectomy.  Original Report Authenticated By: Jamesetta Orleans. MATTERN, M.D.     1. Ureteral stone    New Prescriptions   KETOROLAC (TORADOL) 10 MG TABLET    Take 1 po QID until gone   PERCOCET 5-325 MG PER TABLET    Take 1 tablet by mouth every 6 (six) hours as needed for pain.   PROMETHAZINE (PHENERGAN) 25 MG TABLET    Take 1 tablet (25 mg total) by mouth every 6 (six) hours as needed for nausea.   TAMSULOSIN HCL (FLOMAX) 0.4 MG CAPS    Take 1 capsule (0.4 mg total) by mouth daily.    Plan discharge  Devoria Albe, MD, FACEP   MDM          Ward Givens, MD 06/23/11 2223

## 2011-06-23 NOTE — Discharge Instructions (Signed)
Drink plenty of fluids. Take the medication as directed. Return if you get a fever, or have uncontrolled vomiting or pain. Recheck with Dr Rito Ehrlich if you haven't passed the stone in the next week.  Ureteral Colic Ureteral colic is spasm-like pain from the kidney or the ureter. This is often caused by a kidney stone. The pain is caused by the stone trying to get through the tubes that pass your pee. HOME CARE   Drink enough fluids to keep your pee (urine) clear or pale yellow.   Strain all your pee. A strainer will be provided. Keep anything caught in the strainer and bring it to your doctor. The stone causing the pain may be very small.   Only take medicine as told by your doctor.   Follow up with your doctor as told.  GET HELP RIGHT AWAY IF:   Pain is not controlled with medicine.   Pain continues or gets worse.   The pain changes and there is chest or belly (abdominal) pain.   You pass out (faint).   You cannot pee.   You keep throwing up (vomiting).   You have a temperature by mouth above 102 F (38.9 C), not controlled by medicine.  MAKE SURE YOU:   Understand these instructions.   Will watch this condition.   Will get help right away if you are not doing well or get worse.  Document Released: 06/30/2007 Document Revised: 12/31/2010 Document Reviewed: 06/30/2007 [facility] Patient Information 2012 [place], [place].

## 2011-06-23 NOTE — ED Notes (Signed)
Pt felt better initially after the first dose of dilaudid but pain came back after CT.  Says pain radiating around to left side of abd.

## 2011-06-23 NOTE — ED Notes (Signed)
Right flank pain, + nausea denies vomiting , hx of kidney stone, trouble urinating

## 2011-06-23 NOTE — ED Notes (Signed)
Pt reports left groin pain for the past few days.  A couple of hours ago pt started having severe pain in left flank with nausea.    Reports history of kidney stones.

## 2011-10-18 ENCOUNTER — Other Ambulatory Visit (HOSPITAL_COMMUNITY): Payer: Self-pay | Admitting: Family Medicine

## 2011-10-18 ENCOUNTER — Ambulatory Visit (HOSPITAL_COMMUNITY)
Admission: RE | Admit: 2011-10-18 | Discharge: 2011-10-18 | Disposition: A | Discharge status: Home / Self Care | Payer: PRIVATE HEALTH INSURANCE | Source: Ambulatory Visit | Attending: Family Medicine | Admitting: Family Medicine

## 2011-10-18 DIAGNOSIS — R05 Cough: Secondary | ICD-10-CM

## 2011-10-18 DIAGNOSIS — R059 Cough, unspecified: Secondary | ICD-10-CM | POA: Insufficient documentation

## 2011-10-18 DIAGNOSIS — R079 Chest pain, unspecified: Secondary | ICD-10-CM | POA: Insufficient documentation

## 2013-03-20 ENCOUNTER — Ambulatory Visit (INDEPENDENT_AMBULATORY_CARE_PROVIDER_SITE_OTHER): Payer: PRIVATE HEALTH INSURANCE | Admitting: Internal Medicine

## 2013-03-20 ENCOUNTER — Encounter: Payer: Self-pay | Admitting: Internal Medicine

## 2013-03-20 ENCOUNTER — Telehealth: Payer: Self-pay

## 2013-03-20 ENCOUNTER — Other Ambulatory Visit: Payer: Self-pay

## 2013-03-20 VITALS — BP 140/89 | HR 71 | Temp 98.4°F | Ht 73.0 in | Wt 258.2 lb

## 2013-03-20 DIAGNOSIS — R740 Nonspecific elevation of levels of transaminase and lactic acid dehydrogenase [LDH]: Secondary | ICD-10-CM

## 2013-03-20 DIAGNOSIS — K921 Melena: Secondary | ICD-10-CM

## 2013-03-20 DIAGNOSIS — R7401 Elevation of levels of liver transaminase levels: Secondary | ICD-10-CM

## 2013-03-20 DIAGNOSIS — K625 Hemorrhage of anus and rectum: Secondary | ICD-10-CM

## 2013-03-20 LAB — CBC WITH DIFFERENTIAL/PLATELET
BASOS ABS: 0 10*3/uL (ref 0.0–0.1)
BASOS PCT: 0 % (ref 0–1)
EOS ABS: 0 10*3/uL (ref 0.0–0.7)
EOS PCT: 1 % (ref 0–5)
HCT: 46.5 % (ref 39.0–52.0)
Hemoglobin: 16.7 g/dL (ref 13.0–17.0)
LYMPHS ABS: 1.6 10*3/uL (ref 0.7–4.0)
Lymphocytes Relative: 37 % (ref 12–46)
MCH: 33.1 pg (ref 26.0–34.0)
MCHC: 35.9 g/dL (ref 30.0–36.0)
MCV: 92.1 fL (ref 78.0–100.0)
Monocytes Absolute: 0.6 10*3/uL (ref 0.1–1.0)
Monocytes Relative: 14 % — ABNORMAL HIGH (ref 3–12)
NEUTROS PCT: 48 % (ref 43–77)
Neutro Abs: 2.1 10*3/uL (ref 1.7–7.7)
PLATELETS: 169 10*3/uL (ref 150–400)
RBC: 5.05 MIL/uL (ref 4.22–5.81)
RDW: 12.4 % (ref 11.5–15.5)
WBC: 4.4 10*3/uL (ref 4.0–10.5)

## 2013-03-20 LAB — COMPREHENSIVE METABOLIC PANEL
ALK PHOS: 74 U/L (ref 39–117)
ALT: 58 U/L — AB (ref 0–53)
AST: 32 U/L (ref 0–37)
Albumin: 4.1 g/dL (ref 3.5–5.2)
BILIRUBIN TOTAL: 0.9 mg/dL (ref 0.3–1.2)
BUN: 14 mg/dL (ref 6–23)
CO2: 29 meq/L (ref 19–32)
CREATININE: 1.02 mg/dL (ref 0.50–1.35)
Calcium: 9.4 mg/dL (ref 8.4–10.5)
Chloride: 104 mEq/L (ref 96–112)
Glucose, Bld: 92 mg/dL (ref 70–99)
Potassium: 4.1 mEq/L (ref 3.5–5.3)
Sodium: 143 mEq/L (ref 135–145)
Total Protein: 7.4 g/dL (ref 6.0–8.3)

## 2013-03-20 NOTE — Telephone Encounter (Signed)
Per Dr. Gala Romney, called pt to go to get STAT CBC AND CHEM 12 now at [facility] and be here to see Dr. Gala Romney at 10:00 AM. He said he is near hospital and will go to [facility] now.

## 2013-03-20 NOTE — Patient Instructions (Signed)
Schedule colonoscopy to evaluate rectal bleeding  Split Movie prep  Will retrieve copies of labs done at Dr. Tonette Bihari to compare your liver function blood work

## 2013-03-20 NOTE — Progress Notes (Signed)
Primary Care Physician:  GOLDING, JOHN CABOT, MD Primary Gastroenterologist:  Dr. Kanen Mottola  Pre-Procedure History & Physical: HPI:  Richard Simpson is a 42 y.o. male here for  here for evaluation of rectal bleeding.  Noted passage of dark blood from his rectum on multiple occasions over the past week. No associated pain. Felt the urge to have a bowel movement and it turned out to be just blood on one occasion. Has not had any melena. No abdominal pain.  Has felt his "hemorrhoids" protrude from time to time with scant amount of blood noted when wiping over the years. No prior workup.  No upper GI tract symptoms such as nausea, vomiting, odynophagia, dysphagia, early satiety or reflux symptoms. Labs from today demonstrate a hemoglobin in the 16 range. ALT slightly elevated at 58; everything else on his Chem-12 and CBC looked good. No prior issues with overt GI bleeding. No prior GI evaluation. Has psoriatic arthritis and is taking Remicade. He tells me his rheumatologist, Dr. Anderson, in [place] has noted a minimal elevation in his liver function studies recently, attributed to Remicade.  Previously took methotrexate but only for 2-3 months. Patient takes ibuprofen rarely for arthritis. No alcohol.  Patient has lost 15 pounds over the last 8 weeks cutting back on caloric intake. BMI currently 34.  Has had CT scans back in 2013 and 2007 for kidney stones-both noncontrast studies  - liver appeared normal. The gallbladder is out. Patient denies any prior history of yellow jaundice. No high risk exposures or lifestyle. He is a career police officer.  Past Medical History  Diagnosis Date  . Arthritis   . Headache(784.0)     Past Surgical History  Procedure Laterality Date  . Gallbladder surgery    . Throat surgery      nodules removed from throat as child  . Cholecystectomy      10 yrs ago-jenkins  . Mass excision  09/09/2010    Procedure: EXCISION MASS;  Surgeon: Stanley Harrison, MD;  Location: [facility];  Service: Orthopedics;  Laterality: Left;  Excision Mass Left Long Finger    Prior to Admission medications   Medication Sig Start Date End Date Taking? Authorizing Provider  acetaminophen (TYLENOL) 500 MG tablet Take 1,000 mg by mouth every 6 (six) hours as needed. For pain    Yes Historical Provider, MD  inFLIXimab (REMICADE) 100 MG injection Inject into the vein.   Yes Historical Provider, MD  loratadine (CLARITIN) 10 MG tablet Take 10 mg by mouth daily as needed. For allergies    Yes Historical Provider, MD  methotrexate (RHEUMATREX) 2.5 MG tablet Take 10 mg by mouth once a week. On MONDAYS:Caution:Chemotherapy. Protect from light.   Yes Historical Provider, MD  Multiple Vitamin (MULITIVITAMIN WITH MINERALS) TABS Take 1 tablet by mouth daily.   Yes Historical Provider, MD  ranitidine (ZANTAC) 150 MG capsule Take 150 mg by mouth 2 (two) times daily.   Yes Historical Provider, MD  folic acid (FOLVITE) 1 MG tablet Take 1 mg by mouth daily.    Historical Provider, MD  ketorolac (TORADOL) 10 MG tablet Take 1 po QID until gone 06/23/11   Iva L Knapp, MD  promethazine (PHENERGAN) 25 MG tablet Take 1 tablet (25 mg total) by mouth every 6 (six) hours as needed for nausea. 06/23/11 06/30/11  Iva L Knapp, MD  Tamsulosin HCl (FLOMAX) 0.4 MG CAPS Take 1 capsule (0.4 mg total) by mouth daily. 06/23/11   Iva L Knapp, MD      Allergies as of 03/20/2013  . (No Known Allergies)    Family History  Problem Relation Age of Onset  . Heart disease    . Arthritis    . Cancer    . Anesthesia problems Neg Hx   . Hypotension Neg Hx   . Malignant hyperthermia Neg Hx   . Pseudochol deficiency Neg Hx     History   Social History  . Marital Status: Married    Spouse Name: N/A    Number of Children: N/A  . Years of Education: 12th grade   Occupational History  . police officer    Social History Main Topics  . Smoking status: Former Smoker -- 0.25 packs/day    Types: Cigarettes    Quit date:  09/04/2003  . Smokeless tobacco: Current User    Types: Chew  . Alcohol Use: No  . Drug Use: No  . Sexual Activity: Yes   Other Topics Concern  . Not on file   Social History Narrative  . No narrative on file    Review of Systems: See HPI, otherwise negative ROS  Physical Exam: BP 140/89  Pulse 71  Temp(Src) 98.4 F (36.9 C) (Oral)  Ht 6\' 1"  (1.854 m)  Wt 258 lb 3.2 oz (117.119 kg)  BMI 34.07 kg/m2 General:   Alert,  Well-developed, well-nourished, pleasant and cooperative in NAD Skin:  Intact without significant lesions or rashes. Eyes:  Sclera clear, no icterus.   Conjunctiva pink. Ears:  Normal auditory acuity. Nose:  No deformity, discharge,  or lesions. Mouth:  No deformity or lesions. Neck:  Supple; no masses or thyromegaly. No significant cervical adenopathy. Lungs:  Clear throughout to auscultation.   No wheezes, crackles, or rhonchi. No acute distress. Heart:  Regular rate and rhythm; no murmurs, clicks, rubs,  or gallops. Abdomen: Obese. Non-distended, normal bowel sounds.  Soft and nontender without appreciable mass or hepatosplenomegaly.  Pulses:  Normal pulses noted. Extremities:  Without clubbing or edema. Rectal: No external lesions. Good sphincter tone. Prostate normal to palpation. Scant brown stool in rectal vault. No masses. Stool strongly Hemoccult positive.   Impression/Plan:  Very pleasant 43 year old gentleman with painless hematochezia recently. Hemoglobin remains normal. He strongly Hemoccult positive. I suspect more likely benign anorectal source of bleeding. However, as discussed with the patient, he needs his entire lower GI tract evaluated at this time. To this end, I have offered him a diagnostic colonoscopy in the near future.The risks, benefits, limitations, alternatives and imponderables have been reviewed with the patient. Questions have been answered. All parties are agreeable.   As a separate issue, minimally elevated ALT. Reported minimal  elevation LFTs elsewhere. This is entirely nonspecific at this time. History of methotrexate exposure previously;  increased BMI. ALT elevation could be laboratory error, fatty liver, medication effect or other process. Liver hasn't appeared normal on noncontrast CT scanning previously.  I have recommended patient develope a relationship with a primary care physician. I've also recommended he refrain from using any form of tobacco products.  Additional weight loss and regular aerobic exercise.  At this time, would like to retrieve labs done at Dr. Tonette Bihari office. Will make further recommendations in the near future.

## 2013-03-20 NOTE — Telephone Encounter (Signed)
Appt made and for the patient to see RMR at 10am

## 2013-03-21 ENCOUNTER — Encounter (HOSPITAL_COMMUNITY): Payer: Self-pay | Admitting: Pharmacy Technician

## 2013-03-23 ENCOUNTER — Ambulatory Visit (HOSPITAL_COMMUNITY)
Admission: RE | Admit: 2013-03-23 | Discharge: 2013-03-23 | Disposition: A | Discharge status: Home / Self Care | Payer: PRIVATE HEALTH INSURANCE | Source: Ambulatory Visit | Attending: Internal Medicine | Admitting: Internal Medicine

## 2013-03-23 ENCOUNTER — Encounter (HOSPITAL_COMMUNITY)
Admission: RE | Disposition: A | Discharge status: Home / Self Care | Payer: Self-pay | Source: Ambulatory Visit | Attending: Internal Medicine

## 2013-03-23 DIAGNOSIS — K625 Hemorrhage of anus and rectum: Secondary | ICD-10-CM

## 2013-03-23 DIAGNOSIS — D126 Benign neoplasm of colon, unspecified: Secondary | ICD-10-CM

## 2013-03-23 DIAGNOSIS — K921 Melena: Secondary | ICD-10-CM

## 2013-03-23 DIAGNOSIS — R740 Nonspecific elevation of levels of transaminase and lactic acid dehydrogenase [LDH]: Secondary | ICD-10-CM

## 2013-03-23 DIAGNOSIS — K644 Residual hemorrhoidal skin tags: Secondary | ICD-10-CM | POA: Insufficient documentation

## 2013-03-23 DIAGNOSIS — R7401 Elevation of levels of liver transaminase levels: Secondary | ICD-10-CM

## 2013-03-23 DIAGNOSIS — Z6834 Body mass index (BMI) 34.0-34.9, adult: Secondary | ICD-10-CM | POA: Insufficient documentation

## 2013-03-23 DIAGNOSIS — Z79899 Other long term (current) drug therapy: Secondary | ICD-10-CM | POA: Insufficient documentation

## 2013-03-23 DIAGNOSIS — K620 Anal polyp: Secondary | ICD-10-CM | POA: Insufficient documentation

## 2013-03-23 DIAGNOSIS — L4050 Arthropathic psoriasis, unspecified: Secondary | ICD-10-CM | POA: Insufficient documentation

## 2013-03-23 DIAGNOSIS — K621 Rectal polyp: Secondary | ICD-10-CM

## 2013-03-23 HISTORY — PX: COLONOSCOPY: SHX5424

## 2013-03-23 SURGERY — COLONOSCOPY
Anesthesia: Moderate Sedation

## 2013-03-23 MED ORDER — MIDAZOLAM HCL 5 MG/5ML IJ SOLN
INTRAMUSCULAR | Status: DC | PRN
  Administered 2013-03-23 (×2): 2 mg via INTRAVENOUS
  Administered 2013-03-23: 1 mg via INTRAVENOUS

## 2013-03-23 MED ORDER — STERILE WATER FOR IRRIGATION IR SOLN
Status: DC | PRN
  Administered 2013-03-23: 10:00:00

## 2013-03-23 MED ORDER — SODIUM CHLORIDE 0.9 % IV SOLN
INTRAVENOUS | Status: DC
  Administered 2013-03-23: 10:00:00 via INTRAVENOUS

## 2013-03-23 MED ORDER — ONDANSETRON HCL 4 MG/2ML IJ SOLN
INTRAMUSCULAR | Status: AC
  Filled 2013-03-23: qty 2

## 2013-03-23 MED ORDER — MIDAZOLAM HCL 5 MG/5ML IJ SOLN
INTRAMUSCULAR | Status: AC
  Filled 2013-03-23: qty 10

## 2013-03-23 MED ORDER — MEPERIDINE HCL 100 MG/ML IJ SOLN
INTRAMUSCULAR | Status: AC
  Filled 2013-03-23: qty 2

## 2013-03-23 MED ORDER — ONDANSETRON HCL 4 MG/2ML IJ SOLN
INTRAMUSCULAR | Status: DC | PRN
  Administered 2013-03-23: 4 mg via INTRAVENOUS

## 2013-03-23 MED ORDER — MEPERIDINE HCL 100 MG/ML IJ SOLN
INTRAMUSCULAR | Status: DC | PRN
  Administered 2013-03-23: 50 mg via INTRAVENOUS
  Administered 2013-03-23: 25 mg via INTRAVENOUS
  Administered 2013-03-23: 50 mg via INTRAVENOUS

## 2013-03-23 NOTE — Op Note (Signed)
[facility] [address] [place], [address]   COLONOSCOPY PROCEDURE REPORT  PATIENT: Richard Simpson, Richard Simpson  MR#:         [id number] BIRTHDATE: 29-Aug-1970 , 42  yrs. old GENDER: Male ENDOSCOPIST: R.  Garfield Cornea, MD FACP [facility] REFERRED BY: PROCEDURE DATE:  03/23/2013 PROCEDURE:     Ileocolonoscopy with biopsy and snare polypectomy  INDICATIONS: Paper hematochezia  INFORMED CONSENT:  The risks, benefits, alternatives and imponderables including but not limited to bleeding, perforation as well as the possibility of a missed lesion have been reviewed.  The potential for biopsy, lesion removal, etc. have also been discussed.  Questions have been answered.  All parties agreeable. Please see the history and physical in the medical record for more information.  MEDICATIONS: Versed 6 mg IV and Demerol 125 mg IV in divided doses. Zofran 4 mg IV  DESCRIPTION OF PROCEDURE:  After a digital rectal exam was performed, the EC-[id number] ([id number])  colonoscope was advanced from the anus through the rectum and colon to the area of the cecum, ileocecal valve and appendiceal orifice.  The cecum was deeply intubated.  These structures were well-seen and photographed for the record.  From the level of the cecum and ileocecal valve, the scope was slowly and cautiously withdrawn.  The mucosal surfaces were carefully surveyed utilizing scope tip deflection to facilitate fold flattening as needed.  The scope was pulled down into the rectum where a thorough examination including retroflexion was performed.    FINDINGS:  Adequate preparation. Anal canal hemorrhoid and single anal papilla (1) 5 mm distal rectal polyp just inside the anal verge; otherwise, the remainder of the rectal mucosa appeared normal. The patient had (2) diminutive polyps at the hepatic flexure; otherwise, the remainder of the colonic mucosa appeared normal. The distal 10 cm of terminal ileal mucosa also  appeared normal.  THERAPEUTIC / DIAGNOSTIC MANEUVERS PERFORMED:  the hepatic flexure polyps were cold biopsied/removed. The distal rectal polyp was hot snare removed  COMPLICATIONS: None  CECAL WITHDRAWAL TIME:  23 minutes  IMPRESSION:  Rectal and colonic polyps removed as described above. Hemorrhoids with anal papilla; remainder of rectum, colon and terminal ileum appeared normal.  I suspect benign anal rectal bleeding likely from hemorrhoids.  RECOMMENDATIONS:  Course of Anusol suppositories. Limit straining and toilet time. Benefiber fiber supplement daily. Followup on pathology. If continued hematochezia, can consider in office in hemorrhoid banding.   _______________________________ eSigned:  R. Garfield Cornea, MD FACP [facility] 03/23/2013 11:26 AM   CC:    PATIENT NAME:  Connelly, [place] MR#: [id number]

## 2013-03-23 NOTE — Interval H&P Note (Signed)
History and Physical Interval Note:  03/23/2013 10:25 AM  Richard Simpson  has presented today for surgery, with the diagnosis of HEMATOCHEZIA  The various methods of treatment have been discussed with the patient and family. After consideration of risks, benefits and other options for treatment, the patient has consented to  Procedure(s) with comments: COLONOSCOPY (N/A) - 2:15-moved to 1030 Staff notified pt as a surgical intervention .  The patient's history has been reviewed, patient examined, no change in status, stable for surgery.  I have reviewed the patient's chart and labs.  Questions were answered to the patient's satisfaction.     No change. Colonoscopy per plan.The risks, benefits, limitations, alternatives and imponderables have been reviewed with the patient. Questions have been answered. All parties are agreeable.   [name]

## 2013-03-23 NOTE — Discharge Instructions (Addendum)
Colonoscopy Discharge Instructions  Read the instructions outlined below and refer to this sheet in the next few weeks. These discharge instructions provide you with general information on caring for yourself after you leave the hospital. Your doctor may also give you specific instructions. While your treatment has been planned according to the most current medical practices available, unavoidable complications occasionally occur. If you have any problems or questions after discharge, call Dr. Gala Romney at [phone]. ACTIVITY  You may resume your regular activity, but move at a slower pace for the next 24 hours.   Take frequent rest periods for the next 24 hours.   Walking will help get rid of the air and reduce the bloated feeling in your belly (abdomen).   No driving for 24 hours (because of the medicine (anesthesia) used during the test).    Do not sign any important legal documents or operate any machinery for 24 hours (because of the anesthesia used during the test).  NUTRITION  Drink plenty of fluids.   You may resume your normal diet as instructed by your doctor.   Begin with a light meal and progress to your normal diet. Heavy or fried foods are harder to digest and may make you feel sick to your stomach (nauseated).   Avoid alcoholic beverages for 24 hours or as instructed.  MEDICATIONS  You may resume your normal medications unless your doctor tells you otherwise.  WHAT YOU CAN EXPECT TODAY  Some feelings of bloating in the abdomen.   Passage of more gas than usual.   Spotting of blood in your stool or on the toilet paper.  IF YOU HAD POLYPS REMOVED DURING THE COLONOSCOPY:  No aspirin products for 7 days or as instructed.   No alcohol for 7 days or as instructed.   Eat a soft diet for the next 24 hours.  FINDING OUT THE RESULTS OF YOUR TEST Not all test results are available during your visit. If your test results are not back during the visit, make an appointment  with your caregiver to find out the results. Do not assume everything is normal if you have not heard from your caregiver or the medical facility. It is important for you to follow up on all of your test results.  SEEK IMMEDIATE MEDICAL ATTENTION IF:  You have more than a spotting of blood in your stool.   Your belly is swollen (abdominal distention).   You are nauseated or vomiting.   You have a temperature over 101.   You have abdominal pain or discomfort that is severe or gets worse throughout the day.    Polyp and hemorrhoid information provided  Anusol suppositories one per rectum twice daily for 10 days  Avoid straining.  Benefiber 2 teaspoons twice daily  Limit toilet time to 2-5 minutes  Further recommendations to follow pending review of pathology report  You may benefit from getting your hemorrhoids banded in the office in the near future  Hemorrhoids Hemorrhoids are swollen veins around the rectum or anus. There are two types of hemorrhoids:   Internal hemorrhoids. These occur in the veins just inside the rectum. They may poke through to the outside and become irritated and painful.  External hemorrhoids. These occur in the veins outside the anus and can be felt as a painful swelling or hard lump near the anus. CAUSES  Pregnancy.   Obesity.   Constipation or diarrhea.   Straining to have a bowel movement.   Sitting for long periods  on the toilet.  Heavy lifting or other activity that caused you to strain.  Anal intercourse. SYMPTOMS   Pain.   Anal itching or irritation.   Rectal bleeding.   Fecal leakage.   Anal swelling.   One or more lumps around the anus.  DIAGNOSIS  Your caregiver may be able to diagnose hemorrhoids by visual examination. Other examinations or tests that may be performed include:   Examination of the rectal area with a gloved hand (digital rectal exam).   Examination of anal canal using a small tube (scope).    A blood test if you have lost a significant amount of blood.  A test to look inside the colon (sigmoidoscopy or colonoscopy). TREATMENT Most hemorrhoids can be treated at home. However, if symptoms do not seem to be getting better or if you have a lot of rectal bleeding, your caregiver may perform a procedure to help make the hemorrhoids get smaller or remove them completely. Possible treatments include:   Placing a rubber band at the base of the hemorrhoid to cut off the circulation (rubber band ligation).   Injecting a chemical to shrink the hemorrhoid (sclerotherapy).   Using a tool to burn the hemorrhoid (infrared light therapy).   Surgically removing the hemorrhoid (hemorrhoidectomy).   Stapling the hemorrhoid to block blood flow to the tissue (hemorrhoid stapling).  HOME CARE INSTRUCTIONS   Eat foods with fiber, such as whole grains, beans, nuts, fruits, and vegetables. Ask your doctor about taking products with added fiber in them (fibersupplements).  Increase fluid intake. Drink enough water and fluids to keep your urine clear or pale yellow.   Exercise regularly.   Go to the bathroom when you have the urge to have a bowel movement. Do not wait.   Avoid straining to have bowel movements.   Keep the anal area dry and clean. Use wet toilet paper or moist towelettes after a bowel movement.   Medicated creams and suppositories may be used or applied as directed.   Only take over-the-counter or prescription medicines as directed by your caregiver.   Take warm sitz baths for 15 20 minutes, 3 4 times a day to ease pain and discomfort.   Place ice packs on the hemorrhoids if they are tender and swollen. Using ice packs between sitz baths may be helpful.   Put ice in a plastic bag.   Place a towel between your skin and the bag.   Leave the ice on for 15 20 minutes, 3 4 times a day.   Do not use a donut-shaped pillow or sit on the toilet for long  periods. This increases blood pooling and pain.  SEEK MEDICAL CARE IF:  You have increasing pain and swelling that is not controlled by treatment or medicine.  You have uncontrolled bleeding.  You have difficulty or you are unable to have a bowel movement.  You have pain or inflammation outside the area of the hemorrhoids. MAKE SURE YOU:  Understand these instructions.  Will watch your condition.  Will get help right away if you are not doing well or get worse. Document Released: 01/09/2000 Document Revised: 12/29/2011 Document Reviewed: 11/16/2011 [facility] Patient Information 2014 [place]. Colon Polyps Polyps are lumps of extra tissue growing inside the body. Polyps can grow in the large intestine (colon). Most colon polyps are noncancerous (benign). However, some colon polyps can become cancerous over time. Polyps that are larger than a pea may be harmful. To be safe, caregivers remove and  test all polyps. CAUSES  Polyps form when mutations in the genes cause your cells to grow and divide even though no more tissue is needed. RISK FACTORS There are a number of risk factors that can increase your chances of getting colon polyps. They include:  Being older than 50 years.  Family history of colon polyps or colon cancer.  Long-term colon diseases, such as colitis or Crohn disease.  Being overweight.  Smoking.  Being inactive.  Drinking too much alcohol. SYMPTOMS  Most small polyps do not cause symptoms. If symptoms are present, they may include:  Blood in the stool. The stool may look dark red or black.  Constipation or diarrhea that lasts longer than 1 week. DIAGNOSIS People often do not know they have polyps until their caregiver finds them during a regular checkup. Your caregiver can use 4 tests to check for polyps:  Digital rectal exam. The caregiver wears gloves and feels inside the rectum. This test would find polyps only in the rectum.  Barium enema.  The caregiver puts a liquid called barium into your rectum before taking X-rays of your colon. Barium makes your colon look white. Polyps are dark, so they are easy to see in the X-ray pictures.  Sigmoidoscopy. A thin, flexible tube (sigmoidoscope) is placed into your rectum. The sigmoidoscope has a light and tiny camera in it. The caregiver uses the sigmoidoscope to look at the last third of your colon.  Colonoscopy. This test is like sigmoidoscopy, but the caregiver looks at the entire colon. This is the most common method for finding and removing polyps. TREATMENT  Any polyps will be removed during a sigmoidoscopy or colonoscopy. The polyps are then tested for cancer. PREVENTION  To help lower your risk of getting more colon polyps:  Eat plenty of fruits and vegetables. Avoid eating fatty foods.  Do not smoke.  Avoid drinking alcohol.  Exercise every day.  Lose weight if recommended by your caregiver.  Eat plenty of calcium and folate. Foods that are rich in calcium include milk, cheese, and broccoli. Foods that are rich in folate include chickpeas, kidney beans, and spinach. HOME CARE INSTRUCTIONS Keep all follow-up appointments as directed by your caregiver. You may need periodic exams to check for polyps. SEEK MEDICAL CARE IF: You notice bleeding during a bowel movement. Document Released: 10/08/2003 Document Revised: 04/05/2011 Document Reviewed: 03/23/2011 [facility] Patient Information 2014 [place].

## 2013-03-23 NOTE — H&P (View-Only) (Signed)
Primary Care Physician:  Purvis Kilts, MD Primary Gastroenterologist:  Dr. Gala Romney  Pre-Procedure History & Physical: HPI:  Richard Simpson is a 43 y.o. male here for  here for evaluation of rectal bleeding.  Noted passage of dark blood from his rectum on multiple occasions over the past week. No associated pain. Felt the urge to have a bowel movement and it turned out to be just blood on one occasion. Has not had any melena. No abdominal pain.  Has felt his "hemorrhoids" protrude from time to time with scant amount of blood noted when wiping over the years. No prior workup.  No upper GI tract symptoms such as nausea, vomiting, odynophagia, dysphagia, early satiety or reflux symptoms. Labs from today demonstrate a hemoglobin in the 16 range. ALT slightly elevated at 58; everything else on his Chem-12 and CBC looked good. No prior issues with overt GI bleeding. No prior GI evaluation. Has psoriatic arthritis and is taking Remicade. He tells me his rheumatologist, Dr. Ouida Sills, in [place] has noted a minimal elevation in his liver function studies recently, attributed to Remicade.  Previously took methotrexate but only for 2-3 months. Patient takes ibuprofen rarely for arthritis. No alcohol.  Patient has lost 15 pounds over the last 8 weeks cutting back on caloric intake. BMI currently 34.  Has had CT scans back in 2013 and 2007 for kidney stones-both noncontrast studies  - liver appeared normal. The gallbladder is out. Patient denies any prior history of yellow jaundice. No high risk exposures or lifestyle. He is a Copy.  Past Medical History  Diagnosis Date  . Arthritis   . JMEQASTM(196.2)     Past Surgical History  Procedure Laterality Date  . Gallbladder surgery    . Throat surgery      nodules removed from throat as child  . Cholecystectomy      10 yrs ago-jenkins  . Mass excision  09/09/2010    Procedure: EXCISION MASS;  Surgeon: Arther Abbott, MD;  Location: [facility];  Service: Orthopedics;  Laterality: Left;  Excision Mass Left Long Finger    Prior to Admission medications   Medication Sig Start Date End Date Taking? Authorizing Provider  acetaminophen (TYLENOL) 500 MG tablet Take 1,000 mg by mouth every 6 (six) hours as needed. For pain    Yes Historical Provider, MD  inFLIXimab (REMICADE) 100 MG injection Inject into the vein.   Yes Historical Provider, MD  loratadine (CLARITIN) 10 MG tablet Take 10 mg by mouth daily as needed. For allergies    Yes Historical Provider, MD  methotrexate (RHEUMATREX) 2.5 MG tablet Take 10 mg by mouth once a week. On MONDAYS:Caution:Chemotherapy. Protect from light.   Yes Historical Provider, MD  Multiple Vitamin (MULITIVITAMIN WITH MINERALS) TABS Take 1 tablet by mouth daily.   Yes Historical Provider, MD  ranitidine (ZANTAC) 150 MG capsule Take 150 mg by mouth 2 (two) times daily.   Yes Historical Provider, MD  folic acid (FOLVITE) 1 MG tablet Take 1 mg by mouth daily.    Historical Provider, MD  ketorolac (TORADOL) 10 MG tablet Take 1 po QID until gone 06/23/11   Janice Norrie, MD  promethazine (PHENERGAN) 25 MG tablet Take 1 tablet (25 mg total) by mouth every 6 (six) hours as needed for nausea. 06/23/11 06/30/11  Janice Norrie, MD  Tamsulosin HCl (FLOMAX) 0.4 MG CAPS Take 1 capsule (0.4 mg total) by mouth daily. 06/23/11   Janice Norrie, MD  Allergies as of 03/20/2013  . (No Known Allergies)    Family History  Problem Relation Age of Onset  . Heart disease    . Arthritis    . Cancer    . Anesthesia problems Neg Hx   . Hypotension Neg Hx   . Malignant hyperthermia Neg Hx   . Pseudochol deficiency Neg Hx     History   Social History  . Marital Status: Married    Spouse Name: N/A    Number of Children: N/A  . Years of Education: 12th grade   Occupational History  . police officer    Social History Main Topics  . Smoking status: Former Smoker -- 0.25 packs/day    Types: Cigarettes    Quit date:  09/04/2003  . Smokeless tobacco: Current User    Types: Chew  . Alcohol Use: No  . Drug Use: No  . Sexual Activity: Yes   Other Topics Concern  . Not on file   Social History Narrative  . No narrative on file    Review of Systems: See HPI, otherwise negative ROS  Physical Exam: BP 140/89  Pulse 71  Temp(Src) 98.4 F (36.9 C) (Oral)  Ht 6\' 1"  (1.854 m)  Wt 258 lb 3.2 oz (117.119 kg)  BMI 34.07 kg/m2 General:   Alert,  Well-developed, well-nourished, pleasant and cooperative in NAD Skin:  Intact without significant lesions or rashes. Eyes:  Sclera clear, no icterus.   Conjunctiva pink. Ears:  Normal auditory acuity. Nose:  No deformity, discharge,  or lesions. Mouth:  No deformity or lesions. Neck:  Supple; no masses or thyromegaly. No significant cervical adenopathy. Lungs:  Clear throughout to auscultation.   No wheezes, crackles, or rhonchi. No acute distress. Heart:  Regular rate and rhythm; no murmurs, clicks, rubs,  or gallops. Abdomen: Obese. Non-distended, normal bowel sounds.  Soft and nontender without appreciable mass or hepatosplenomegaly.  Pulses:  Normal pulses noted. Extremities:  Without clubbing or edema. Rectal: No external lesions. Good sphincter tone. Prostate normal to palpation. Scant brown stool in rectal vault. No masses. Stool strongly Hemoccult positive.   Impression/Plan:  Very pleasant 43 year old gentleman with painless hematochezia recently. Hemoglobin remains normal. He strongly Hemoccult positive. I suspect more likely benign anorectal source of bleeding. However, as discussed with the patient, he needs his entire lower GI tract evaluated at this time. To this end, I have offered him a diagnostic colonoscopy in the near future.The risks, benefits, limitations, alternatives and imponderables have been reviewed with the patient. Questions have been answered. All parties are agreeable.   As a separate issue, minimally elevated ALT. Reported minimal  elevation LFTs elsewhere. This is entirely nonspecific at this time. History of methotrexate exposure previously;  increased BMI. ALT elevation could be laboratory error, fatty liver, medication effect or other process. Liver hasn't appeared normal on noncontrast CT scanning previously.  I have recommended patient develope a relationship with a primary care physician. I've also recommended he refrain from using any form of tobacco products.  Additional weight loss and regular aerobic exercise.  At this time, would like to retrieve labs done at Dr. Tonette Bihari office. Will make further recommendations in the near future.

## 2013-03-26 ENCOUNTER — Encounter (HOSPITAL_COMMUNITY): Payer: Self-pay | Admitting: Internal Medicine

## 2013-03-27 ENCOUNTER — Encounter: Payer: Self-pay | Admitting: Internal Medicine

## 2013-04-03 NOTE — Progress Notes (Signed)
Patient ID: Richard Simpson, male   DOB: [birth year], 43 y.o.   MRN: [id number] Received labs from Dr. Tonette Bihari office. He's had a very minimal elevation in ALT going back to August of last year. Also blood sugar minimally elevated. BMI 34. Has been exposed to methotrexate. Ongoing biologic therapy. Noncontrast CTs - multiple over the past 10 years demonstrated a normal liver;   ultrasound 10 years ago normal hepatic parenchyma sonographically. He has central obesity. He does not use alcohol  Recommendations: Viral markers for hepatitis B and C. (hepatitis B surface antigen and hepatitis C antibody.)   Serum iron, iron-binding capacity and ferritin. Regular exercise. At least 20 pounds between now and the end of the year.  May need further evaluation depending on how liver enzymes run between now and the end of the year. Will collect them from Dr. Tonette Bihari office as they become available.  Please let patient know. We want to see a liver enzymes normal..

## 2013-04-23 ENCOUNTER — Other Ambulatory Visit: Payer: Self-pay | Admitting: Internal Medicine

## 2013-04-23 ENCOUNTER — Other Ambulatory Visit: Payer: Self-pay

## 2013-04-23 DIAGNOSIS — R740 Nonspecific elevation of levels of transaminase and lactic acid dehydrogenase [LDH]: Principal | ICD-10-CM

## 2013-04-23 DIAGNOSIS — R7401 Elevation of levels of liver transaminase levels: Secondary | ICD-10-CM

## 2013-04-23 DIAGNOSIS — R7402 Elevation of levels of lactic acid dehydrogenase (LDH): Secondary | ICD-10-CM

## 2013-04-23 NOTE — Progress Notes (Signed)
Letter and lab order mailed to pt. 

## 2013-06-27 ENCOUNTER — Ambulatory Visit (HOSPITAL_COMMUNITY)
Admission: RE | Admit: 2013-06-27 | Discharge: 2013-06-27 | Disposition: A | Discharge status: Home / Self Care | Payer: PRIVATE HEALTH INSURANCE | Source: Ambulatory Visit | Attending: Rheumatology | Admitting: Rheumatology

## 2013-06-27 ENCOUNTER — Other Ambulatory Visit (HOSPITAL_COMMUNITY): Payer: Self-pay | Admitting: Rheumatology

## 2013-06-27 DIAGNOSIS — R7611 Nonspecific reaction to tuberculin skin test without active tuberculosis: Secondary | ICD-10-CM

## 2013-06-27 DIAGNOSIS — R0602 Shortness of breath: Secondary | ICD-10-CM | POA: Insufficient documentation

## 2013-09-23 ENCOUNTER — Emergency Department (HOSPITAL_COMMUNITY): Payer: PRIVATE HEALTH INSURANCE

## 2013-09-23 ENCOUNTER — Observation Stay (HOSPITAL_COMMUNITY)
Admission: EM | Admit: 2013-09-23 | Discharge: 2013-09-24 | Disposition: A | Discharge status: Home / Self Care | Payer: PRIVATE HEALTH INSURANCE | Attending: Internal Medicine | Admitting: Internal Medicine

## 2013-09-23 ENCOUNTER — Encounter (HOSPITAL_COMMUNITY): Payer: Self-pay | Admitting: Emergency Medicine

## 2013-09-23 DIAGNOSIS — R9389 Abnormal findings on diagnostic imaging of other specified body structures: Secondary | ICD-10-CM

## 2013-09-23 DIAGNOSIS — IMO0002 Reserved for concepts with insufficient information to code with codable children: Secondary | ICD-10-CM

## 2013-09-23 DIAGNOSIS — M069 Rheumatoid arthritis, unspecified: Secondary | ICD-10-CM | POA: Diagnosis present

## 2013-09-23 DIAGNOSIS — R110 Nausea: Secondary | ICD-10-CM | POA: Diagnosis not present

## 2013-09-23 DIAGNOSIS — M129 Arthropathy, unspecified: Secondary | ICD-10-CM | POA: Insufficient documentation

## 2013-09-23 DIAGNOSIS — Z79899 Other long term (current) drug therapy: Secondary | ICD-10-CM | POA: Insufficient documentation

## 2013-09-23 DIAGNOSIS — Z87891 Personal history of nicotine dependence: Secondary | ICD-10-CM | POA: Insufficient documentation

## 2013-09-23 DIAGNOSIS — M549 Dorsalgia, unspecified: Secondary | ICD-10-CM

## 2013-09-23 DIAGNOSIS — R079 Chest pain, unspecified: Secondary | ICD-10-CM | POA: Diagnosis present

## 2013-09-23 DIAGNOSIS — M5124 Other intervertebral disc displacement, thoracic region: Secondary | ICD-10-CM

## 2013-09-23 DIAGNOSIS — R0789 Other chest pain: Secondary | ICD-10-CM | POA: Diagnosis not present

## 2013-09-23 DIAGNOSIS — R61 Generalized hyperhidrosis: Secondary | ICD-10-CM | POA: Diagnosis not present

## 2013-09-23 DIAGNOSIS — K219 Gastro-esophageal reflux disease without esophagitis: Secondary | ICD-10-CM | POA: Diagnosis present

## 2013-09-23 DIAGNOSIS — R2232 Localized swelling, mass and lump, left upper limb: Secondary | ICD-10-CM

## 2013-09-23 HISTORY — DX: Rheumatoid arthritis, unspecified: M06.9

## 2013-09-23 LAB — BASIC METABOLIC PANEL
Anion gap: 12 (ref 5–15)
BUN: 13 mg/dL (ref 6–23)
CALCIUM: 9.4 mg/dL (ref 8.4–10.5)
CO2: 23 mEq/L (ref 19–32)
Chloride: 104 mEq/L (ref 96–112)
Creatinine, Ser: 0.89 mg/dL (ref 0.50–1.35)
Glucose, Bld: 97 mg/dL (ref 70–99)
Potassium: 3.8 mEq/L (ref 3.7–5.3)
Sodium: 139 mEq/L (ref 137–147)

## 2013-09-23 LAB — CBC
HEMATOCRIT: 42.7 % (ref 39.0–52.0)
Hemoglobin: 15.3 g/dL (ref 13.0–17.0)
MCH: 32.1 pg (ref 26.0–34.0)
MCHC: 35.8 g/dL (ref 30.0–36.0)
MCV: 89.7 fL (ref 78.0–100.0)
Platelets: 149 10*3/uL — ABNORMAL LOW (ref 150–400)
RBC: 4.76 MIL/uL (ref 4.22–5.81)
RDW: 12.3 % (ref 11.5–15.5)
WBC: 4.3 10*3/uL (ref 4.0–10.5)

## 2013-09-23 LAB — HEPATIC FUNCTION PANEL
ALK PHOS: 70 U/L (ref 39–117)
ALT: 46 U/L (ref 0–53)
AST: 30 U/L (ref 0–37)
Albumin: 3.9 g/dL (ref 3.5–5.2)
Total Bilirubin: 0.6 mg/dL (ref 0.3–1.2)
Total Protein: 6.9 g/dL (ref 6.0–8.3)

## 2013-09-23 LAB — TROPONIN I: Troponin I: <0.3 ng/mL (ref <0.30)

## 2013-09-23 LAB — TSH: TSH: 1.58 u[IU]/mL (ref 0.350–4.500)

## 2013-09-23 LAB — LIPASE, BLOOD: LIPASE: 28 U/L (ref 11–59)

## 2013-09-23 LAB — D-DIMER, QUANTITATIVE (NOT AT ARMC)

## 2013-09-23 MED ORDER — ONDANSETRON HCL 4 MG/2ML IJ SOLN: 4.0000 mg | Freq: Four times a day (QID) | INTRAMUSCULAR | Status: DC | PRN

## 2013-09-23 MED ORDER — ASPIRIN 81 MG PO CHEW
324.0000 mg | CHEWABLE_TABLET | Freq: Once | ORAL | Status: AC
  Administered 2013-09-23: 324 mg via ORAL
  Filled 2013-09-23: qty 4

## 2013-09-23 MED ORDER — SODIUM CHLORIDE 0.9 % IJ SOLN
3.0000 mL | Freq: Two times a day (BID) | INTRAMUSCULAR | Status: DC
  Administered 2013-09-24: 3 mL via INTRAVENOUS

## 2013-09-23 MED ORDER — ACETAMINOPHEN 650 MG RE SUPP: 650.0000 mg | Freq: Four times a day (QID) | RECTAL | Status: DC | PRN

## 2013-09-23 MED ORDER — MORPHINE SULFATE 2 MG/ML IJ SOLN
2.0000 mg | INTRAMUSCULAR | Status: DC | PRN
  Administered 2013-09-23: 2 mg via INTRAVENOUS
  Filled 2013-09-23: qty 1

## 2013-09-23 MED ORDER — PANTOPRAZOLE SODIUM 40 MG IV SOLR
40.0000 mg | Freq: Once | INTRAVENOUS | Status: AC
  Administered 2013-09-23: 40 mg via INTRAVENOUS
  Filled 2013-09-23: qty 40

## 2013-09-23 MED ORDER — GI COCKTAIL ~~LOC~~
30.0000 mL | Freq: Once | ORAL | Status: AC
  Administered 2013-09-23: 30 mL via ORAL
  Filled 2013-09-23: qty 30

## 2013-09-23 MED ORDER — PANTOPRAZOLE SODIUM 40 MG PO TBEC
40.0000 mg | DELAYED_RELEASE_TABLET | Freq: Every day | ORAL | Status: DC
  Administered 2013-09-24: 40 mg via ORAL
  Filled 2013-09-23: qty 1

## 2013-09-23 MED ORDER — SODIUM CHLORIDE 0.9 % IJ SOLN
3.0000 mL | INTRAMUSCULAR | Status: DC | PRN
Start: 2013-09-23 — End: 2013-09-24

## 2013-09-23 MED ORDER — SODIUM CHLORIDE 0.9 % IV SOLN: 250.0000 mL | INTRAVENOUS | Status: DC | PRN

## 2013-09-23 MED ORDER — HYDRALAZINE HCL 25 MG PO TABS
25.0000 mg | ORAL_TABLET | Freq: Four times a day (QID) | ORAL | Status: DC | PRN
  Administered 2013-09-23: 25 mg via ORAL
  Filled 2013-09-23: qty 1

## 2013-09-23 MED ORDER — SODIUM CHLORIDE 0.9 % IJ SOLN
3.0000 mL | Freq: Two times a day (BID) | INTRAMUSCULAR | Status: DC
  Administered 2013-09-23 – 2013-09-24 (×2): 3 mL via INTRAVENOUS

## 2013-09-23 MED ORDER — ENOXAPARIN SODIUM 40 MG/0.4ML ~~LOC~~ SOLN
40.0000 mg | SUBCUTANEOUS | Status: DC
  Administered 2013-09-23: 40 mg via SUBCUTANEOUS
  Filled 2013-09-23: qty 0.4

## 2013-09-23 MED ORDER — ASPIRIN EC 325 MG PO TBEC
325.0000 mg | DELAYED_RELEASE_TABLET | Freq: Every day | ORAL | Status: DC
  Administered 2013-09-24: 325 mg via ORAL
  Filled 2013-09-23: qty 1

## 2013-09-23 MED ORDER — ALUM & MAG HYDROXIDE-SIMETH 200-200-20 MG/5ML PO SUSP
30.0000 mL | Freq: Four times a day (QID) | ORAL | Status: DC | PRN
  Administered 2013-09-23: 30 mL via ORAL
  Filled 2013-09-23: qty 30

## 2013-09-23 MED ORDER — ONDANSETRON HCL 4 MG PO TABS: 4.0000 mg | ORAL_TABLET | Freq: Four times a day (QID) | ORAL | Status: DC | PRN

## 2013-09-23 MED ORDER — ACETAMINOPHEN 325 MG PO TABS: 650.0000 mg | ORAL_TABLET | Freq: Four times a day (QID) | ORAL | Status: DC | PRN

## 2013-09-23 NOTE — ED Notes (Signed)
Pt reports chest pain since 130 this am. Pt reports nausea and that pain is worse with a deep breath. nad noted. Airway patent.

## 2013-09-23 NOTE — ED Provider Notes (Signed)
CSN: [id number]     Arrival date & time 09/23/13  0844 History  This chart was scribed for Ezequiel Essex, MD by Starleen Arms, ED Scribe. This patient was seen in room [facility] and the patient's care was started at 9:00 AM.   Chief Complaint  Patient presents with  . Chest Pain   The history is provided by the patient. No language interpreter was used.    HPI Comments: Richard Simpson is a 43 y.o. male who presents to the Emergency Department complaining of a waxing and waning burning sensation in his chest that intermittently encroaches to his throat and nausea onset 1:30 am.  Patient states the onset of the sensation awoke him from his sleep.  Patient denies aggravating or alleviating factors.  Patient reports he has psoriatic arthritis and takes Remicade, with most recent dose 1.5 weeks ago.  He also takes Zantac regularly but denies history of GERD.  Patient reports back pain normal to baseline.  He reports a family history of MI in several uncles.  Patient denies previous history of similar episodes.  Patient denies previous cardiac stress test.  Patient denies history of GERD, HTN, DM.  Patient denies smoking. Patient denies vomiting, radiation, SOB, cough, fever, headache.    Past Medical History  Diagnosis Date  . Arthritis   . FIEPPIRJ(188.4)    Past Surgical History  Procedure Laterality Date  . Gallbladder surgery    . Throat surgery      nodules removed from throat as child  . Cholecystectomy      10 yrs ago-jenkins  . Mass excision  09/09/2010    Procedure: EXCISION MASS;  Surgeon: Arther Abbott, MD;  Location: [facility];  Service: Orthopedics;  Laterality: Left;  Excision Mass Left Long Finger  . Colonoscopy N/A 03/23/2013    Procedure: COLONOSCOPY;  Surgeon: Daneil Dolin, MD;  Location: [facility];  Service: Endoscopy;  Laterality: N/A;  2:15-moved to 1030 Staff notified pt   Family History  Problem Relation Age of Onset  . Heart disease    . Arthritis    .  Cancer    . Anesthesia problems Neg Hx   . Hypotension Neg Hx   . Malignant hyperthermia Neg Hx   . Pseudochol deficiency Neg Hx    History  Substance Use Topics  . Smoking status: Former Smoker -- 0.25 packs/day    Types: Cigarettes    Quit date: 09/04/2003  . Smokeless tobacco: Current User    Types: Chew  . Alcohol Use: No     Comment: rare    Review of Systems  A complete 10 system review of systems was obtained and all systems are negative except as noted in the HPI and PMH.   Allergies  Review of patient's allergies indicates no known allergies.  Home Medications   Prior to Admission medications   Medication Sig Start Date End Date Taking? Authorizing Provider  ibuprofen (ADVIL,MOTRIN) 800 MG tablet Take 800 mg by mouth every 8 (eight) hours as needed for moderate pain.   Yes Historical Provider, MD  inFLIXimab (REMICADE) 100 MG injection Inject into the vein.   Yes Historical Provider, MD  loratadine (CLARITIN) 10 MG tablet Take 10 mg by mouth daily as needed. For allergies    Yes Historical Provider, MD  ranitidine (ZANTAC) 150 MG capsule Take 150 mg by mouth 2 (two) times daily.   Yes Historical Provider, MD   BP 159/100  Pulse 62  Temp(Src) 97.7 F (36.5  C) (Oral)  Resp 21  Ht 6\' 1"  (1.854 m)  Wt 265 lb (120.203 kg)  BMI 34.97 kg/m2  SpO2 100% Physical Exam  Nursing note and vitals reviewed. Constitutional: He is oriented to person, place, and time. He appears well-developed and well-nourished. No distress.  HENT:  Head: Normocephalic and atraumatic.  Mouth/Throat: Oropharynx is clear and moist. No oropharyngeal exudate.  Eyes: Conjunctivae and EOM are normal. Pupils are equal, round, and reactive to light.  Neck: Normal range of motion. Neck supple.  No meningismus.  Cardiovascular: Normal rate, regular rhythm, normal heart sounds and intact distal pulses.   No murmur heard. Pulmonary/Chest: Effort normal and breath sounds normal. No respiratory  distress. He exhibits no tenderness.  Abdominal: Soft. There is no tenderness. There is no rebound and no guarding.  Musculoskeletal: Normal range of motion. He exhibits no edema and no tenderness.  Neurological: He is alert and oriented to person, place, and time. No cranial nerve deficit. He exhibits normal muscle tone. Coordination normal.  No ataxia on finger to nose bilaterally. No pronator drift. 5/5 strength throughout. CN 2-12 intact. Negative Romberg. Equal grip strength. Sensation intact. Gait is normal.   Skin: Skin is warm.  Psychiatric: He has a normal mood and affect. His behavior is normal.    ED Course  Procedures (including critical care time)  DIAGNOSTIC STUDIES: Oxygen Saturation is 100% on RA, normal by my interpretation.    COORDINATION OF CARE:  9:10 AM Discussed treatment plan with patient at bedside.  Patient acknowledges and agrees with plan.    10:29 AM Upon recheck, patient states his condition has improved.  Discussed negative labs and imaging.  Will continue to monitor patient to r/o cardiac concerns.    Labs Review Labs Reviewed  CBC - Abnormal; Notable for the following:    Platelets 149 (*)    All other components within normal limits  BASIC METABOLIC PANEL  TROPONIN I  HEPATIC FUNCTION PANEL  LIPASE, BLOOD  TROPONIN I  D-DIMER, QUANTITATIVE  TSH  TROPONIN I  TROPONIN I  BASIC METABOLIC PANEL  CBC    Imaging Review Dg Chest 2 View  09/23/2013   CLINICAL DATA:  Chest pain.  EXAM: CHEST  2 VIEW  COMPARISON:  06/27/2013.  FINDINGS: Mediastinum and hilar structures are normal. Low lung volumes. Left lower lobe atelectasis and/or infiltrate noted. No pleural effusion or pneumothorax. Heart size stable. Pulmonary vascularity normal. No acute bony abnormality. Interposition of colon under the right hemidiaphragm.  IMPRESSION: Left lower lobe subsegmental atelectasis and/or pneumonia .   Electronically Signed   By: Marcello Moores  Register   On: 09/23/2013  10:03     EKG Interpretation   Date/Time:  Sunday September 23 2013 08:55:57 EDT Ventricular Rate:  65 PR Interval:  136 QRS Duration: 98 QT Interval:  396 QTC Calculation: 412 R Axis:   53 Text Interpretation:  Sinus rhythm Low voltage, precordial leads RSR' in  V1 or V2, right VCD or RVH Borderline T abnormalities, inferior leads  Baseline wander in lead(s) II V4 No previous ECGs available Confirmed by  Wyvonnia Dusky  MD, Broady Lafoy ([id number]) on 09/23/2013 9:09:05 AM      MDM   Final diagnoses:  Chest pain, unspecified chest pain type   Patient awoke with burning sensation in his chest since 1:30 AM with nausea and diaphoresis. No shortness of breath, no fever or cough. Nothing makes the pain better or worse. History of psoriatic arthritis on Remicade. No cardiac history.  EKG  with nonspecific T wave changes inferiorly. Troponin negative.  Some improvement with GI cocktail.  Pain seems atypical, constant since 130am.  However EKG does have T wave inversion and family history of early CAD.  HEART score 3. Patient prefers observation and rule out versus outpatient stress test.  D/w Dr. Roderic Palau.  I personally performed the services described in this documentation, which was scribed in my presence. The recorded information has been reviewed and is accurate.   Ezequiel Essex, MD 09/23/13 [phone]

## 2013-09-23 NOTE — Progress Notes (Signed)
[facility] History and Physical  Richard Simpson YJE:[id number] DOB: Aug 22, 1970 DOA: 09/23/2013  Referring physician: Dr. Wyvonnia Dusky, ER physician PCP: Purvis Kilts, MD   Chief Complaint: chest pain  HPI: Richard Simpson is a 43 y.o. male with a history of rheumatoid arthritis on Remicade, presents to the emergency room with complaints of chest discomfort that began at 1:30 AM and awoke patient from sleep. He reports it is a constant discomfort since onset. He feels that it may have a burning component and may be radiating up to his neck. His wife reports that she had noted associated shortness of breath and diaphoresis. He had associated nausea but no vomiting. He is unsure whether he had any lightheadedness. He does not had any recent cough or fever. No diarrhea, constipation, dysuria. He is unsure whether activity makes his pain worse. On arrival to the emergency room he received a GI cocktail, which he reported "numbed the pain", but did not relieve his discomfort. He reports it as some degree he still has his discomfort. Patient does not smoke but he does chew tobacco. He reports a strong family history of premature coronary disease with several uncles having massive heart attacks before the age of 42. He was evaluated in the emergency room where his EKG did show inverted T waves in lead III, without any prior EKG for comparison. Initial cardiac marker was negative. He is being admitted for further evaluation.   Review of Systems:  Pertinent positives as per HPI, otherwise negative  Past Medical History  Diagnosis Date  . Arthritis   . OVZCHYIF(027.7)    Past Surgical History  Procedure Laterality Date  . Gallbladder surgery    . Throat surgery      nodules removed from throat as child  . Cholecystectomy      10 yrs ago-jenkins  . Mass excision  09/09/2010    Procedure: EXCISION MASS;  Surgeon: Arther Abbott, MD;  Location: [facility];  Service: Orthopedics;  Laterality:  Left;  Excision Mass Left Long Finger  . Colonoscopy N/A 03/23/2013    Procedure: COLONOSCOPY;  Surgeon: Daneil Dolin, MD;  Location: [facility];  Service: Endoscopy;  Laterality: N/A;  2:15-moved to 1030 Staff notified pt   Social History:  reports that he quit smoking about 10 years ago. His smoking use included Cigarettes. He smoked 0.25 packs per day. His smokeless tobacco use includes Chew. He reports that he does not drink alcohol or use illicit drugs.  No Known Allergies  Family History  Problem Relation Age of Onset  . Heart disease    . Arthritis    . Cancer    . Anesthesia problems Neg Hx   . Hypotension Neg Hx   . Malignant hyperthermia Neg Hx   . Pseudochol deficiency Neg Hx      Prior to Admission medications   Medication Sig Start Date End Date Taking? Authorizing Provider  ibuprofen (ADVIL,MOTRIN) 800 MG tablet Take 800 mg by mouth every 8 (eight) hours as needed for moderate pain.   Yes Historical Provider, MD  inFLIXimab (REMICADE) 100 MG injection Inject into the vein.   Yes Historical Provider, MD  loratadine (CLARITIN) 10 MG tablet Take 10 mg by mouth daily as needed. For allergies    Yes Historical Provider, MD  ranitidine (ZANTAC) 150 MG capsule Take 150 mg by mouth 2 (two) times daily.   Yes Historical Provider, MD   Physical Exam: Filed Vitals:   09/23/13 1131 09/23/13 1230  09/23/13 1443 09/23/13 1512  BP: 155/107 145/110 132/93 159/100  Pulse: 52 65 63 62  Temp:    97.7 F (36.5 C)  TempSrc:    Oral  Resp: 20 18 21    Height:    6\' 1"  (1.854 m)  Weight:    120.203 kg (265 lb)  SpO2: 96% 97% 98% 100%    Wt Readings from Last 3 Encounters:  09/23/13 120.203 kg (265 lb)  03/20/13 117.119 kg (258 lb 3.2 oz)  06/23/11 113.399 kg (250 lb)    General:  Appears calm and comfortable Eyes: PERRL, normal lids, irises & conjunctiva ENT: grossly normal hearing, lips & tongue Neck: no LAD, masses or thyromegaly Cardiovascular: RRR, no m/r/g. No LE  edema. Telemetry: SR, no arrhythmias  Respiratory: CTA bilaterally, no w/r/r. Normal respiratory effort. Abdomen: soft, ntnd Skin: no rash or induration seen on limited exam Musculoskeletal: grossly normal tone BUE/BLE Psychiatric: grossly normal mood and affect, speech fluent and appropriate Neurologic: grossly non-focal.          Labs on Admission:  Basic Metabolic Panel:  Recent Labs Lab 09/23/13 0919  NA 139  K 3.8  CL 104  CO2 23  GLUCOSE 97  BUN 13  CREATININE 0.89  CALCIUM 9.4   Liver Function Tests:  Recent Labs Lab 09/23/13 0919  AST 30  ALT 46  ALKPHOS 70  BILITOT 0.6  PROT 6.9  ALBUMIN 3.9    Recent Labs Lab 09/23/13 0919  LIPASE 28   No results found for this basename: AMMONIA,  in the last 168 hours CBC:  Recent Labs Lab 09/23/13 0919  WBC 4.3  HGB 15.3  HCT 42.7  MCV 89.7  PLT 149*   Cardiac Enzymes:  Recent Labs Lab 09/23/13 0919  TROPONINI <0.30    BNP (last 3 results) No results found for this basename: PROBNP,  in the last 8760 hours CBG: No results found for this basename: GLUCAP,  in the last 168 hours  Radiological Exams on Admission: Dg Chest 2 View  09/23/2013   CLINICAL DATA:  Chest pain.  EXAM: CHEST  2 VIEW  COMPARISON:  06/27/2013.  FINDINGS: Mediastinum and hilar structures are normal. Low lung volumes. Left lower lobe atelectasis and/or infiltrate noted. No pleural effusion or pneumothorax. Heart size stable. Pulmonary vascularity normal. No acute bony abnormality. Interposition of colon under the right hemidiaphragm.  IMPRESSION: Left lower lobe subsegmental atelectasis and/or pneumonia .   Electronically Signed   By: Marcello Moores  Register   On: 09/23/2013 10:03    EKG: Independently reviewed. Inverted T waves in III  Assessment/Plan Active Problems:   Chest pain   Rheumatoid arthritis   GERD (gastroesophageal reflux disease)   1. Chest pain. Etiology is not entirely clear. Appears to be atypical, although  some of his symptoms are concerning as well as his family history. We will monitor him on telemetry. Cycle cardiac markers. Repeat EKG in the morning and check echocardiogram. Check d-dimer. If his discomfort continues, will likely need cardiology evaluation prior to discharge. 2. Rheumatoid arthritis. Patient reports that he often has GERD symptoms after receiving Remicade. He takes Zantac at home. His joints do not appear to be inflamed at this point. 3. GERD. Patient takes Zantac at home. We'll provide Protonix and Maalox at this time. 4. Fatigue. Possibly related to insomnia and restless leg syndrome. Check TSH    Code Status: full code DVT Prophylaxis: lovenox Family Communication: discussed with patient and wife Disposition Plan: discharge home once  improved  Time spent: 61mins  Yehuda Printup [facility] Pager [phone]  **Disclaimer: This note may have been dictated with voice recognition software. Similar sounding words can inadvertently be transcribed and this note may contain transcription errors which may not have been corrected upon publication of note.**

## 2013-09-24 DIAGNOSIS — R9389 Abnormal findings on diagnostic imaging of other specified body structures: Secondary | ICD-10-CM

## 2013-09-24 DIAGNOSIS — R079 Chest pain, unspecified: Secondary | ICD-10-CM

## 2013-09-24 DIAGNOSIS — I359 Nonrheumatic aortic valve disorder, unspecified: Secondary | ICD-10-CM

## 2013-09-24 LAB — BASIC METABOLIC PANEL
ANION GAP: 11 (ref 5–15)
BUN: 13 mg/dL (ref 6–23)
CALCIUM: 8.9 mg/dL (ref 8.4–10.5)
CO2: 26 mEq/L (ref 19–32)
Chloride: 103 mEq/L (ref 96–112)
Creatinine, Ser: 0.98 mg/dL (ref 0.50–1.35)
GFR calc Af Amer: >90 mL/min (ref >90)
Glucose, Bld: 90 mg/dL (ref 70–99)
Potassium: 4 mEq/L (ref 3.7–5.3)
SODIUM: 140 meq/L (ref 137–147)

## 2013-09-24 LAB — CBC
HCT: 43.7 % (ref 39.0–52.0)
Hemoglobin: 15.6 g/dL (ref 13.0–17.0)
MCH: 32.3 pg (ref 26.0–34.0)
MCHC: 35.7 g/dL (ref 30.0–36.0)
MCV: 90.5 fL (ref 78.0–100.0)
Platelets: 149 10*3/uL — ABNORMAL LOW (ref 150–400)
RBC: 4.83 MIL/uL (ref 4.22–5.81)
RDW: 12.3 % (ref 11.5–15.5)
WBC: 4.4 10*3/uL (ref 4.0–10.5)

## 2013-09-24 LAB — TROPONIN I: Troponin I: <0.3 ng/mL (ref <0.30)

## 2013-09-24 MED ORDER — PANTOPRAZOLE SODIUM 40 MG PO TBEC
40.0000 mg | DELAYED_RELEASE_TABLET | Freq: Two times a day (BID) | ORAL | Status: DC
Start: 2013-09-24 — End: 2013-10-29

## 2013-09-24 NOTE — Progress Notes (Signed)
BP recheck 111/70. Sleeping. Denies pain.

## 2013-09-24 NOTE — Progress Notes (Signed)
No significant abnormalities on echo. Will have our office arrange outpatient follow up and GXT. [place] for discharge from cardiac standpoint  Zandra Abts MD

## 2013-09-24 NOTE — Progress Notes (Signed)
*  PRELIMINARY RESULTS* Echocardiogram 2D Echocardiogram has been performed.  [name] 09/24/2013, 12:26 PM

## 2013-09-24 NOTE — Discharge Summary (Signed)
Physician Discharge Summary  Richard Simpson:[id number] DOB: 1970/12/10 DOA: 09/23/2013  PCP: Purvis Kilts, MD  Admit date: 09/23/2013 Discharge date: 09/24/2013  Time spent: 40 minutes  Recommendations for Outpatient Follow-up:  1. Cardiology office will contact for OP stress test 2. Follow up with PCP 1-2 weeks for evaluation of chest pain/GERD, BP control and complaints of fatigue. Consider repeat chest xray in 4-5 weeks. May consider GI follow for consideration of EGD.   Discharge Diagnoses:  Active Problems:   Chest pain   Rheumatoid arthritis   GERD (gastroesophageal reflux disease)   Abnormal chest x-ray   Discharge Condition: stable  Diet recommendation: heart healthy  Filed Weights   09/23/13 0849 09/23/13 1512  Weight: 120.203 kg (265 lb) 120.203 kg (265 lb)    History of present illness:  Richard Simpson is a 43 y.o. male with a history of rheumatoid arthritis on Remicade, presented to the emergency room on 09/23/13 with complaints of chest discomfort that began at 1:30 AM and awoke patient from sleep. He reported it as a constant discomfort since onset. He felt that it may have a burning component and may be radiating up to his neck. His wife reported that she had noted associated shortness of breath and diaphoresis. He had associated nausea but no vomiting. He was unsure whether he had any lightheadedness. He denied recent cough or fever. No diarrhea, constipation, dysuria. He was unsure whether activity makes his pain worse. On arrival to the emergency room he received a GI cocktail, which he reported "numbed the pain", but did not relieve his discomfort.  Patient does not smoke but he does chew tobacco. He reported a strong family history of premature coronary disease with several uncles having massive heart attacks before the age of 59. He was evaluated in the emergency room where his EKG did show inverted T waves in lead III, without any prior EKG for comparison.  Initial cardiac marker was negative.  Hospital Course:  1. Chest pain. Ruled out.  No events on tele. Troponin negative x3. Repeat EKG without specific ischemic changes per cards. D-dimer negative. Eco results EF 60% no significant abnormalities. Evaluated by cardiology who opine pain atypical in duration and relieved somewhat by [facility] and GI cocktail. Office will call to arrange OP follow up GXT. 2. Rheumatoid arthritis. Stable at baseline.  Patient reported that he often has GERD symptoms after receiving Remicade.  3. GERD. Stable at baseline. Provided with  Protonix and Maalox due to #1.  4. Fatigue. Possibly related to insomnia and restless leg syndrome. TSH 1.58.  5. Abnormal chest xray. Revealed LLL atelectasis and/or pneumonia. He is afebrile, no leukocytosis, no sob no cough. Denies DOE. Recommend OP follow up chest xray in 4-5 weeks.      Procedures:  Echo on 09/24/13  Consultations:  Dr Harl Bowie cardiology  Discharge Exam: Danley Danker Vitals:   09/24/13 0631  BP: 134/95  Pulse: 63  Temp: 97.8 F (36.6 C)  Resp: 20    General: well nourished NAD Cardiovascular: RRR No MGR no LE edema Respiratory: normal effort BS clear bilaterally   Discharge Instructions You were cared for by a hospitalist during your hospital stay. If you have any questions about your discharge medications or the care you received while you were in the hospital after you are discharged, you can call the unit and asked to speak with the hospitalist on call if the hospitalist that took care of you is not available. Once you are  discharged, your primary care physician will handle any further medical issues. Please note that NO REFILLS for any discharge medications will be authorized once you are discharged, as it is imperative that you return to your primary care physician (or establish a relationship with a primary care physician if you do not have one) for your aftercare needs so that they can reassess your need  for medications and monitor your lab values.      Discharge Instructions   Diet - low sodium heart healthy    Complete by:  As directed      Increase activity slowly    Complete by:  As directed             Medication List         ibuprofen 800 MG tablet  Commonly known as:  ADVIL,MOTRIN  Take 800 mg by mouth every 8 (eight) hours as needed for moderate pain.     loratadine 10 MG tablet  Commonly known as:  CLARITIN  Take 10 mg by mouth daily as needed. For allergies     pantoprazole 40 MG tablet  Commonly known as:  PROTONIX  Take 1 tablet (40 mg total) by mouth 2 (two) times daily.     ranitidine 150 MG capsule  Commonly known as:  ZANTAC  Take 150 mg by mouth 2 (two) times daily.     REMICADE 100 MG injection  Generic drug:  inFLIXimab  Inject into the vein.       No Known Allergies Follow-up Information   Follow up with [facility]. (Register at the front entrance at 8:30 am. )    Specialty:  Cardiac Rehabilitation   Contact information:   [address] [id number] [place] [phone]      Follow up with Jory Sims, NP On 10/08/2013. (Your appointment is at 1:50 pm.)    Specialty:  Nurse Practitioner   Contact information:   [address] [place]  [address] [phone]        The results of significant diagnostics from this hospitalization (including imaging, microbiology, ancillary and laboratory) are listed below for reference.    Significant Diagnostic Studies: Dg Chest 2 View  09/23/2013   CLINICAL DATA:  Chest pain.  EXAM: CHEST  2 VIEW  COMPARISON:  06/27/2013.  FINDINGS: Mediastinum and hilar structures are normal. Low lung volumes. Left lower lobe atelectasis and/or infiltrate noted. No pleural effusion or pneumothorax. Heart size stable. Pulmonary vascularity normal. No acute bony abnormality. Interposition of colon under the right hemidiaphragm.  IMPRESSION: Left lower lobe subsegmental  atelectasis and/or pneumonia .   Electronically Signed   By: Marcello Moores  Register   On: 09/23/2013 10:03    Microbiology: No results found for this or any previous visit (from the past 240 hour(s)).   Labs: Basic Metabolic Panel:  Recent Labs Lab 09/23/13 0919 09/24/13 0434  NA 139 140  K 3.8 4.0  CL 104 103  CO2 23 26  GLUCOSE 97 90  BUN 13 13  CREATININE 0.89 0.98  CALCIUM 9.4 8.9   Liver Function Tests:  Recent Labs Lab 09/23/13 0919  AST 30  ALT 46  ALKPHOS 70  BILITOT 0.6  PROT 6.9  ALBUMIN 3.9    Recent Labs Lab 09/23/13 0919  LIPASE 28   No results found for this basename: AMMONIA,  in the last 168 hours CBC:  Recent Labs Lab 09/23/13 0919 09/24/13 0434  WBC 4.3 4.4  HGB 15.3  15.6  HCT 42.7 43.7  MCV 89.7 90.5  PLT 149* 149*   Cardiac Enzymes:  Recent Labs Lab 09/23/13 0910 09/23/13 0919 09/23/13 2143 09/24/13 0434  TROPONINI <0.30 <0.30 <0.30 <0.30   BNP: BNP (last 3 results) No results found for this basename: PROBNP,  in the last 8760 hours CBG: No results found for this basename: GLUCAP,  in the last 168 hours     Signed:  Radene Gunning  [facility] 09/24/2013, 3:37 PM

## 2013-09-24 NOTE — Progress Notes (Signed)
Complained of chest pain 5/10. BP elevated. Paged MD. Obtained orders for morphine IV and hydralazine po.

## 2013-09-24 NOTE — Consult Note (Signed)
Consulting cardiologist: Dr Carlyle Dolly MD  Clinical Summary Richard Simpson is a 43 y.o.male hx of rheumatoid arthritis admitted with chest pain. Started Sunday at 130 AM, severe burning in epigastric area and midchest, + nausea. No SOB, no palitations, no dipahoresis. Last constantly for approx 8 hours. Somewhat better with Tums and GI cocktail. Denies any DOE over the last several weeks.   CAD risk factors: strong FH, former tobacco, HL, age  Trop neg x4, EKG without specific ischemic changes. CXR LLL atelectasis vs pneumonia D-dimer neg, TSH 1.58, lipase 28, echo pending.   No Known Allergies  Medications Scheduled Medications: . aspirin EC  325 mg Oral Daily  . enoxaparin (LOVENOX) injection  40 mg Subcutaneous Q24H  . pantoprazole  40 mg Oral Daily  . sodium chloride  3 mL Intravenous Q12H  . sodium chloride  3 mL Intravenous Q12H     Infusions:     PRN Medications:  sodium chloride, acetaminophen, acetaminophen, alum & mag hydroxide-simeth, hydrALAZINE, morphine injection, ondansetron (ZOFRAN) IV, ondansetron, sodium chloride   Past Medical History  Diagnosis Date  . Arthritis   . Headache(784.0)     Past Surgical History  Procedure Laterality Date  . Gallbladder surgery    . Throat surgery      nodules removed from throat as child  . Cholecystectomy      10  yrs ago-jenkins  . Mass excision  09/09/2010    Procedure: EXCISION MASS;  Surgeon: Arther Abbott, MD;  Location: [facility];  Service: Orthopedics;  Laterality: Left;  Excision Mass Left Long Finger  . Colonoscopy N/A 03/23/2013    Procedure: COLONOSCOPY;  Surgeon: Daneil Dolin, MD;  Location: [facility];  Service: Endoscopy;  Laterality: N/A;  2:15-moved to 1030 Staff notified pt    Family History  Problem Relation Age of Onset  . Heart disease    . Arthritis    . Cancer    . Anesthesia problems Neg Hx   . Hypotension Neg Hx   . Malignant hyperthermia Neg Hx   . Pseudochol deficiency Neg  Hx     Social History Richard Simpson reports that he quit smoking about 10 years ago. His smoking use included Cigarettes. He smoked 0.25 packs per day. His smokeless tobacco use includes Chew. Richard Simpson reports that he does not drink alcohol.  Review of Systems CONSTITUTIONAL: No weight loss, fever, chills, weakness or fatigue.  HEENT: Eyes: No visual loss, blurred vision, double vision or yellow sclerae. No hearing loss, sneezing, congestion, runny nose or sore throat.  SKIN: No rash or itching.  CARDIOVASCULAR: per HPI RESPIRATORY: No shortness of breath, cough or sputum.  GASTROINTESTINAL: No anorexia, nausea, vomiting or diarrhea. No abdominal pain or blood.  GENITOURINARY: no polyuria, no dysuria NEUROLOGICAL: No headache, dizziness, syncope, paralysis, ataxia, numbness or tingling in the extremities. No change in bowel or bladder control.  MUSCULOSKELETAL: No muscle, back pain, joint pain or stiffness.  HEMATOLOGIC: No anemia, bleeding or bruising.  LYMPHATICS: No enlarged nodes. No history of splenectomy.  PSYCHIATRIC: No history of depression or anxiety.      Physical Examination Blood pressure 134/95, pulse 63, temperature 97.8 F (36.6 C), temperature source Oral, resp. rate 20, height 6\' 1"  (1.854 m), weight 265 lb (120.203 kg), SpO2 98.00%.  Intake/Output Summary (Last 24 hours) at 09/24/13 0818 Last data filed at 09/23/13 1700  Gross per 24 hour  Intake    240 ml  Output      0  ml  Net    240 ml    Cardiovascular: RRR, no m/r/g, no JVD, no carotid bruits  Respiratory: CTAB  GI: abdomen soft, NT, ND  MSK: no LE edema  Neuro: no focal deficits  Psych: appropriate affect   Lab Results  Basic Metabolic Panel:  Recent Labs Lab 09/23/13 0919 09/24/13 0434  NA 139 140  K 3.8 4.0  CL 104 103  CO2 23 26  GLUCOSE 97 90  BUN 13 13  CREATININE 0.89 0.98  CALCIUM 9.4 8.9    Liver Function Tests:  Recent Labs Lab 09/23/13 0919  AST 30  ALT 46    ALKPHOS 70  BILITOT 0.6  PROT 6.9  ALBUMIN 3.9    CBC:  Recent Labs Lab 09/23/13 0919 09/24/13 0434  WBC 4.3 4.4  HGB 15.3 15.6  HCT 42.7 43.7  MCV 89.7 90.5  PLT 149* 149*    Cardiac Enzymes:  Recent Labs Lab 09/23/13 0910 09/23/13 0919 09/23/13 2143 09/24/13 0434  TROPONINI <0.30 <0.30 <0.30 <0.30    BNP: No components found with this basename: POCBNP,     Impression/Recommendations 1. Chest pain - atypical in duratino, lasting approx 8 hours. Also better with Tums and GI cocktail - no evidence of ACS at this time - f/u echo, if normal recommend discharge home with outpatient GXT due to his risk factors for CAD    Carlyle Dolly, M.D., F.A.C.C.

## 2013-09-24 NOTE — Progress Notes (Signed)
Chest pain now 1/10. Feels better. BP elevated. Gave hydralazine.

## 2013-09-24 NOTE — Progress Notes (Signed)
UR Completed.  Raiquan Chandler Jane [phone] 09/24/2013  

## 2013-09-24 NOTE — Discharge Summary (Signed)
Patient seen and examined.  Agree with note as outlined above.  Patient was admitted to the hospital with chest discomfort. He ruled out for ACS with negative cardiac markers. EKG did not show specific ischemic changes. D-dimer was found to be negative. The patient is already status post cholecystectomy. Liver enzymes and lipase were otherwise normal. He was placed on Protonix and reports some relief with GI cocktail, although his symptoms are not completely resolved. He was seen by cardiology and underwent echocardiogram which was unremarkable. Plans are for outpatient stress test. If his symptoms persist, would consider outpatient gastroenterology referral for possible upper endoscopy.  Gerene Nedd

## 2013-09-26 ENCOUNTER — Encounter (HOSPITAL_COMMUNITY): Payer: Self-pay

## 2013-09-26 ENCOUNTER — Ambulatory Visit (HOSPITAL_COMMUNITY)
Admit: 2013-09-26 | Discharge: 2013-09-26 | Disposition: A | Discharge status: Home / Self Care | Payer: PRIVATE HEALTH INSURANCE | Source: Ambulatory Visit | Attending: Cardiology | Admitting: Cardiology

## 2013-09-26 DIAGNOSIS — R5383 Other fatigue: Secondary | ICD-10-CM | POA: Diagnosis not present

## 2013-09-26 DIAGNOSIS — R079 Chest pain, unspecified: Secondary | ICD-10-CM

## 2013-09-26 DIAGNOSIS — R0602 Shortness of breath: Secondary | ICD-10-CM | POA: Insufficient documentation

## 2013-09-26 DIAGNOSIS — R5381 Other malaise: Secondary | ICD-10-CM | POA: Insufficient documentation

## 2013-09-26 NOTE — Progress Notes (Addendum)
Stress Lab Nurses Notes - Forestine Na  KIEGAN MACARAEG 09/26/2013 Reason for doing test: Chest Pain Type of test: Regular GTX Nurse performing test: Gerrit Halls, RN Nuclear Medicine Tech: Not Applicable Echo Tech: Not Applicable MD performing test: Branch/K.Purcell Nails NP Family MD: Hilma Favors Test explained and consent signed: Yes.   IV started: No IV started Symptoms: Fatigue & SOB Treatment/Intervention: None Reason test stopped: fatigue After recovery IV was: NA Patient to return to Nuc. Med at : NA Patient discharged: Home Patient's Condition upon discharge was: stable Comments: During test peak BP 169/97 & HR 151. Recovery BP 120/96 & HR 96.  Symptoms resolved in recovery.  Geanie Cooley T  ATTENDING PHYSICIAN: Resting ECG demonstrated normal sinus rhythm. With exercise, there was significant undulating artifact with no gross ischemic changes noted, nor any arrhythmias. No chest pain was reported. Duke treadmill score 10, indicative of a low risk exercise treadmill stress test.

## 2013-09-28 ENCOUNTER — Encounter: Payer: Self-pay | Admitting: Cardiology

## 2013-10-05 ENCOUNTER — Encounter: Payer: Self-pay | Admitting: Adult Health

## 2013-10-05 NOTE — Progress Notes (Signed)
    Error. Cancelled appt 

## 2013-10-08 ENCOUNTER — Encounter: Payer: PRIVATE HEALTH INSURANCE | Admitting: Adult Health

## 2013-10-09 ENCOUNTER — Encounter: Payer: Self-pay | Admitting: *Deleted

## 2013-10-25 ENCOUNTER — Encounter: Payer: Self-pay | Admitting: Neurology

## 2013-10-25 DIAGNOSIS — I2510 Atherosclerotic heart disease of native coronary artery without angina pectoris: Secondary | ICD-10-CM

## 2013-10-25 DIAGNOSIS — Z9861 Coronary angioplasty status: Secondary | ICD-10-CM

## 2013-10-25 HISTORY — DX: Atherosclerotic heart disease of native coronary artery without angina pectoris: I25.10

## 2013-10-25 HISTORY — DX: Coronary angioplasty status: Z98.61

## 2013-10-29 ENCOUNTER — Ambulatory Visit (INDEPENDENT_AMBULATORY_CARE_PROVIDER_SITE_OTHER): Payer: PRIVATE HEALTH INSURANCE | Admitting: Neurology

## 2013-10-29 ENCOUNTER — Encounter (INDEPENDENT_AMBULATORY_CARE_PROVIDER_SITE_OTHER): Payer: Self-pay

## 2013-10-29 ENCOUNTER — Encounter: Payer: Self-pay | Admitting: Neurology

## 2013-10-29 VITALS — BP 180/119 | HR 119 | Resp 16 | Ht 72.0 in | Wt 260.0 lb

## 2013-10-29 DIAGNOSIS — D638 Anemia in other chronic diseases classified elsewhere: Secondary | ICD-10-CM

## 2013-10-29 DIAGNOSIS — E669 Obesity, unspecified: Secondary | ICD-10-CM | POA: Insufficient documentation

## 2013-10-29 DIAGNOSIS — G2581 Restless legs syndrome: Secondary | ICD-10-CM

## 2013-10-29 DIAGNOSIS — R0602 Shortness of breath: Secondary | ICD-10-CM

## 2013-10-29 DIAGNOSIS — G4733 Obstructive sleep apnea (adult) (pediatric): Secondary | ICD-10-CM

## 2013-10-29 DIAGNOSIS — M25569 Pain in unspecified knee: Secondary | ICD-10-CM

## 2013-10-29 DIAGNOSIS — R0683 Snoring: Secondary | ICD-10-CM

## 2013-10-29 HISTORY — DX: Snoring: R06.83

## 2013-10-29 HISTORY — DX: Obesity, unspecified: E66.9

## 2013-10-29 HISTORY — DX: Restless legs syndrome: G25.81

## 2013-10-29 NOTE — Progress Notes (Signed)
SLEEP MEDICINE CLINIC   Provider:  Larey Seat, M D  Referring Provider: Sharilyn Sites, MD Primary Care Physician:  Purvis Kilts, MD  Chief Complaint  Patient presents with  . Sleep Consult    # 10   New Patient - Vision 20/40 both -W/o     HPI:  Richard Simpson is a 43 y.o. male , who is seen here as a referral  from Dr. Hilma Favors and Delman Cheadle, PA  for a sleep evaluation .    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 Hindle just recently shortness of breath and about 14 days ago of there is very experienced and he tried to repair a leaking and became sort of breath as if he had chased somebody physically. He has developed arthritic and muscle pain in feet knees and hands, no rash. This arthritis pain begun 2 years ago  and not new- but the SOB  is very unusual for him. He is on REMICADE. He has seen Leafy Kindle , PA at Dr. Ouida Sills, rheumatology at [facility]. His rheumatologist office as well as his primary care office suspected that he has some form of pleurisy but meters breathing nonlabored and short. He felt exhausted with minimal minimal physical exertion and even short of breath when just talking, just recently developing over the last 3 weeks , fairly acute. He has ended up in the ED after waking up with chest tightness , and a MI was ruled out.  Stress test was difficult due to SOB, but no cardiac impairment was noted.  His PCP suspects him to have OSA, based on BMI and neck shape and upper air way as well as fatigue.  Richard Simpson reports that he has for a long time not had restorative or refreshing sleep he just doesn't feel that he sleeps well and found. This was prior to feeling short of breath. He has been a snorer for many years. His wife has reported he goes to bed at 11 Pm and goes to sleep about 30 minutes later, some nights 2 hours. He shares a bedroom with his wife, a  bedroom that  is cool, quiet and dark. He and his wife like to watch TV in bed before falling asleep" the TV will run through the night.  His wife developed a habit to leave the TV running when he is on night call. He rises by alarm at 6 AM , and takes 30 minutes to get up, hits 2-3 times the snooze button. He feels  Not refreshed, not restored, has a dry mouth, rarely headaches. He has allergic sinusitis , and will become stuffy and his voice will be raspy. He will have 2 nocturias .  Total sleep time at night 4-5 hours on a "good night" . He dreams fairly often but is not haunted by nightmares.   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  He drinks 1 cup of coffee in AM and in the day one mountain dew. No naps in daytime . He is exposed to day light. He exercise regular in a GYM unless pain prevents him.   His weight has been steady over the last 5 years, but about 10-15 years ago he had a spur  of gaining weight  (in his first years of marriage). His current weight is 265 pounds at a height of 6 foot 1 inch.    Review of Systems: Out of a complete 14 system review, the patient complains of only the following symptoms, and all other reviewed systems are negative.   Epworth score 15 , Fatigue severity score 36  , depression score 1.   History   Social History  . Marital Status: Married    Spouse Name: Richard Simpson    Number of Children: 2  . Years of Education: 12th grade   Occupational History  . Engineer, structural   .     Social History Main Topics  . Smoking status: Former Smoker -- 0.25 packs/day    Types: Cigarettes    Quit date: 09/04/2003  . Smokeless tobacco: Current User    Types: Chew  . Alcohol Use: No     Comment: rare  . Drug Use: No  . Sexual Activity: Yes   Other Topics Concern  . Not on file   Social History Narrative     Patient lives at home with his wife Richard Simpson).    Patient works full time Dentist..   Education high school.   Right handed.   Caffeine one cup of coffee daily and one soda.    Family History  Problem Relation Age of Onset  . Arthritis Paternal Uncle   . Cancer Maternal Grandfather   . Anesthesia problems Neg Hx   . Hypotension Neg Hx   . Malignant hyperthermia Neg Hx   . Pseudochol deficiency Neg Hx     Past Medical History  Diagnosis Date  . Arthritis   . ACZYSAYT(016.0)     Past Surgical History  Procedure Laterality Date  . Gallbladder surgery    . Throat surgery      nodules removed from throat as child  . Cholecystectomy      10 yrs ago-jenkins  . Mass excision  09/09/2010    Procedure: EXCISION MASS;  Surgeon: Arther Abbott, MD;  Location: [facility];  Service: Orthopedics;  Laterality: Left;  Excision Mass Left Long Finger  . Colonoscopy N/A 03/23/2013    Procedure: COLONOSCOPY;  Surgeon: Daneil Dolin, MD;  Location: [facility];  Service: Endoscopy;  Laterality: N/A;  2:15-moved to 1030 Staff notified pt    Current Outpatient Prescriptions  Medication Sig Dispense Refill  . ibuprofen (ADVIL,MOTRIN) 800 MG tablet Take 800 mg by mouth every 8 (eight) hours as needed for moderate pain.      Marland Kitchen inFLIXimab (REMICADE) 100 MG injection Inject into the vein.      Marland Kitchen loratadine (CLARITIN) 10 MG tablet Take 10 mg by mouth daily as needed. For allergies       . ranitidine (ZANTAC) 150 MG capsule Take 150 mg by mouth 2 (two) times daily.       No current facility-administered medications for this visit.    Allergies as of 10/29/2013 - Review Complete 10/29/2013  Allergen Reaction Noted  . Prednisone  10/29/2013    Vitals: BP 180/119  Pulse 119  Resp 16  Ht 6' (1.829 m)  Wt 260 lb (117.935 kg)  BMI 35.25 kg/m2 Last Weight:  Wt Readings from Last 1 Encounters:  10/29/13 260 lb (117.935 kg)       Last Height:   Ht Readings from Last 1 Encounters:   10/29/13 6' (1.829 m)    Physical exam:  General: The patient is  awake, alert and appears not in acute distress. The patient is well groomed. Head: Normocephalic, atraumatic. Neck is supple. Mallampati 3-4   neck circumference 19.5 . Nasal airflow unrestricted , TMJ is not  evident . Retrognathia is not seen.  Cardiovascular:  Regular rate and rhythm , without carotid bruit, and without distended neck veins. Respiratory: Lungs are clear to auscultation-  Skin:  Without evidence of edema, or rash Trunk: BMI iselevated and patient  has normal posture.  Neurologic exam : The patient is awake and alert, oriented to place and time.   Memory subjective described as intact. There is a normal attention span & concentration ability. Speech is fluent without  dysarthria, dysphonia or aphasia. Mood and affect are appropriate.  Cranial nerves: Pupils are equal and briskly reactive to light. Funduscopic exam without evidence of pallor or edema.  Extraocular movements  in vertical and horizontal planes intact and without nystagmus. Visual fields by finger perimetry are intact. Hearing to finger rub intact.  Facial sensation intact to fine touch. Facial motor strength is symmetric and tongue and uvula move midline.  Motor exam: Normal tone, muscle bulk and symmetric strength in all extremities.  Sensory:  Fine touch, pinprick and vibration were tested in all extremities.  Proprioception is tested in the upper extremities only. This was normal.  Coordination: Rapid alternating movements in the fingers/hands is normal.  Finger-to-nose maneuver  normal without evidence of ataxia, dysmetria or tremor.  Gait and station: Patient walks without assistive device and is able unassisted to climb up to the exam table.  Strength within normal limits. Stance is stable and normal. Tandem gait is unfragmented. Romberg testing is negative.  Deep tendon reflexes: in the upper and lower extremities are symmetric  and intact.  Babinski maneuver response is downgoing.   Assessment:  After physical and neurologic examination, review of laboratory studies, imaging, neurophysiology testing and pre-existing records, assessment is :  Patient with  1) arthritis treatment on Remicade - check CK and CKMB , he had a cardiac work up but not a muscle work up yet. 2) BMI and airway, jaw and Mallampati and neck size all support his High Risk for OSA, 3)Fatigue ) and he has new onset fatigue to a much higher degree- check for Vit D , Testosterone check.  4) RLS, cramping , creepy crawling sensation.     The patient was advised of the nature of the diagnosed sleep disorder , the treatment options and risks for general a health and wellness arising from not treating the condition. Visit duration was 45 minutes.   Plan:  Treatment plan and additional workup :  SPLIT study with CO2 , split at AHI 10, Medcost, score at 3 % . CK, CKMB and Vit D and testosterone. RLS some time, check TIBC and ferritin.      Richard Partridge Keiosha Cancro MD  10/29/2013

## 2013-10-29 NOTE — Patient Instructions (Signed)

## 2013-10-30 LAB — IRON AND TIBC
IRON: 129 ug/dL (ref 40–155)
Iron Saturation: 46 % (ref 15–55)
TIBC: 279 ug/dL (ref 250–450)
UIBC: 150 ug/dL (ref 150–375)

## 2013-10-30 LAB — CK TOTAL AND CKMB (NOT AT ARMC)
CK-MB Index: 1.5 ng/mL (ref 0.0–10.4)
Total CK: 69 U/L (ref 24–204)

## 2013-10-30 LAB — FERRITIN: Ferritin: 283 ng/mL (ref 30–400)

## 2013-11-15 NOTE — Progress Notes (Signed)
Quick Note:  LMVM on cell that labs normal for iron and muscle enzymes. He is to call back if questions. ______

## 2013-11-22 ENCOUNTER — Encounter: Payer: Self-pay | Admitting: Interventional Cardiology

## 2013-11-22 ENCOUNTER — Ambulatory Visit (INDEPENDENT_AMBULATORY_CARE_PROVIDER_SITE_OTHER): Payer: PRIVATE HEALTH INSURANCE | Admitting: Interventional Cardiology

## 2013-11-22 ENCOUNTER — Other Ambulatory Visit: Payer: Self-pay | Admitting: Interventional Cardiology

## 2013-11-22 ENCOUNTER — Encounter (HOSPITAL_COMMUNITY): Payer: Self-pay | Admitting: Pharmacy Technician

## 2013-11-22 VITALS — BP 150/120 | HR 95 | Ht 72.0 in | Wt 264.0 lb

## 2013-11-22 DIAGNOSIS — E663 Overweight: Secondary | ICD-10-CM

## 2013-11-22 DIAGNOSIS — I209 Angina pectoris, unspecified: Secondary | ICD-10-CM

## 2013-11-22 DIAGNOSIS — I10 Essential (primary) hypertension: Secondary | ICD-10-CM

## 2013-11-22 DIAGNOSIS — Z8249 Family history of ischemic heart disease and other diseases of the circulatory system: Secondary | ICD-10-CM

## 2013-11-22 DIAGNOSIS — R0789 Other chest pain: Secondary | ICD-10-CM

## 2013-11-22 MED ORDER — ASPIRIN EC 81 MG PO TBEC: 81.0000 mg | DELAYED_RELEASE_TABLET | Freq: Every day | ORAL | Status: DC

## 2013-11-22 NOTE — Progress Notes (Signed)
Patient ID: Richard Simpson, male   DOB: 1970-02-24, 43 y.o.   MRN: [id number]     Patient ID: Richard Simpson MRN: [id number] DOB/AGE: February 03, 1970 43 y.o.   Referring Physician    Reason for Consultation  CP, HTN  HPI: 43 y/o man who had bad indigestion in August.  It woke him from sleep and he went to the hospital.  He was admitted to the hospital.  He ruled out for MI with enzymes.  He had a stress test the next day that was negative and was managed conservatively.  Along with the chest pain, he had sweating.  He has not felt well since that time.    He notes that his exercise tolerance has decreased significantly as well.  His CXR was clear in the past few days.  He has DOE and chest discomfort with walking to the mailbox.  He has episodes of sweating while at rest.  He has ben treated with Klonopin.  BP has stayed high.     He has psoriatic arthritis and is treated with remicaide.    Quit smoking 10 yrs ago.  Uses chewing tobacco.   Paternal uncles have had MI/CAD at young ages (3s).   He has had cholesterol elevation in the past.  No medical treatment for this as of yet.   Current Outpatient Prescriptions  Medication Sig Dispense Refill  . clonazePAM (KLONOPIN) 0.5 MG tablet Take 0.5 mg by mouth 2 (two) times daily as needed for anxiety.      Marland Kitchen ibuprofen (ADVIL,MOTRIN) 800 MG tablet Take 800 mg by mouth every 8 (eight) hours as needed for moderate pain.      Marland Kitchen inFLIXimab (REMICADE) 100 MG injection Inject into the vein.      Marland Kitchen loratadine (CLARITIN) 10 MG tablet Take 10 mg by mouth daily as needed. For allergies       . ranitidine (ZANTAC) 150 MG capsule Take 150 mg by mouth 2 (two) times daily.       No current facility-administered medications for this visit.   Past Medical History  Diagnosis Date  . Arthritis   . Headache(784.0)   . RLS (restless legs syndrome) 10/29/2013  . Primary snoring 10/29/2013  . Obesity 10/29/2013  . Shortness of breath 10/29/2013    Family  History  Problem Relation Age of Onset  . Arthritis Paternal Uncle   . Cancer Maternal Grandfather   . Anesthesia problems Neg Hx   . Hypotension Neg Hx   . Malignant hyperthermia Neg Hx   . Pseudochol deficiency Neg Hx   . Heart attack Neg Hx   . Stroke Neg Hx   . Hypertension Mother   . Hypertension Maternal Grandmother   . Diabetes    . Diabetes Mother   . Diabetes Maternal Grandmother   . Diabetes    . Diabetes Paternal Grandfather     History   Social History  . Marital Status: Married    Spouse Name: Richard Simpson    Number of Children: 2  . Years of Education: 12th grade   Occupational History  . Engineer, structural   .     Social History Main Topics  . Smoking status: Former Smoker -- 0.25 packs/day    Types: Cigarettes    Quit date: 09/04/2003  . Smokeless tobacco: Current User    Types: Chew  . Alcohol Use: No     Comment: rare  . Drug Use: No  . Sexual Activity: Yes   Other  Topics Concern  . Not on file   Social History Narrative   Patient lives at home with his wife Richard Simpson).    Patient works full time Dentist..   Education high school.   Right handed.   Caffeine one cup of coffee daily and one soda.    Past Surgical History  Procedure Laterality Date  . Gallbladder surgery    . Throat surgery      nodules removed from throat as child  . Cholecystectomy      10 yrs ago-[facility]  . Mass excision  09/09/2010    Procedure: EXCISION MASS;  Surgeon: Arther Abbott, MD;  Location: [facility];  Service: Orthopedics;  Laterality: Left;  Excision Mass Left Long Finger  . Colonoscopy N/A 03/23/2013    Procedure: COLONOSCOPY;  Surgeon: Daneil Dolin, MD;  Location: [facility];  Service: Endoscopy;  Laterality: N/A;  2:15-moved to 1030 Staff notified pt      (Not in a hospital admission)  Review of systems complete and found to be negative unless listed above .  No nausea, vomiting.  No fever chills, No focal weakness,  No palpitations.  Physical  Exam: Filed Vitals:   11/22/13 1622  BP: 150/120  Pulse: 95    Weight: 264 lb (119.75 kg)  Physical exam:  [place]/AT EOMI No JVD, No carotid bruit RRR S1S2  No wheezing Soft. NT, nondistended No edema. 3+ right radial pulse No focal motor or sensory deficits Normal affect  Labs:   Lab Results  Component Value Date   WBC 4.4 09/24/2013   HGB 15.6 09/24/2013   HCT 43.7 09/24/2013   MCV 90.5 09/24/2013   PLT 149* 09/24/2013   No results found for this basename: NA, K, CL, CO2, BUN, CREATININE, CALCIUM, LABALBU, PROT, BILITOT, ALKPHOS, ALT, AST, GLUCOSE,  in the last 168 hours Lab Results  Component Value Date   CKTOTAL 69 10/29/2013   CKMBINDEX 1.5 10/29/2013   TROPONINI <0.30 09/24/2013    No results found for this basename: CHOL   No results found for this basename: HDL   No results found for this basename: LDLCALC   No results found for this basename: TRIG   No results found for this basename: CHOLHDL   No results found for this basename: LDLDIRECT      Radiology: Stress test from September 2015 reviewed which showed no evidence of ischemia EKG:*Normal  ASSESSMENT AND PLAN:  Chest discomfort /SHOB with typical features: Exertional chest discomfort associated with sweating. Minimal exertion is now causing his symptoms. He had a negative exercise treadmill test but symptoms are getting more frequent and with less intensity. We discussed nuclear stress testing versus cardiac catheterization. Since symptoms are getting worse, we would probably not believe a negative nuclear stress test result. Therefore, we'll proceed with cardiac catheterization. The risks and benefits of the procedure were explained to the patient and he is willing to proceed. We will keep him out of work until the procedure is done.  Hypertension: Would plan on starting lisinopril 10 mg daily for hypertension. Since he is likely to have a heart cath in the next few days, we will hold off on lisinopril at this  time and start after the catheterization, to avoid multiple renal insults.  Family history of heart disease: The patient has multiple paternal uncles who have had coronary artery disease and heart attacks.  The patient has no bleeding issues. There are no upcoming procedures. He should tolerate a drug-eluting  stent if required.  Obesity: He is trying to lose weight with dietary modifications. Signed:   Mina Marble, MD, [facility] 11/22/2013, 4:40 PM

## 2013-11-22 NOTE — Patient Instructions (Addendum)
Your physician has requested that you have a cardiac catheterization. Cardiac catheterization is used to diagnose and/or treat various heart conditions. Doctors may recommend this procedure for a number of different reasons. The most common reason is to evaluate chest pain. Chest pain can be a symptom of coronary artery disease (CAD), and cardiac catheterization can show whether plaque is narrowing or blocking your heart's arteries. This procedure is also used to evaluate the valves, as well as measure the blood flow and oxygen levels in different parts of your heart. For further information please visit [email or web address]. Please follow instruction sheet, as given.  Your physician recommends that you have lab work today: BMP, CBC and PT/INR  Your physician has recommended you make the following change in your medication:  START Aspirin 81mg  once a day

## 2013-11-23 ENCOUNTER — Encounter (HOSPITAL_COMMUNITY)
Admission: RE | Disposition: A | Discharge status: Home / Self Care | Payer: Self-pay | Source: Ambulatory Visit | Attending: Interventional Cardiology

## 2013-11-23 ENCOUNTER — Ambulatory Visit (HOSPITAL_COMMUNITY)
Admission: RE | Admit: 2013-11-23 | Discharge: 2013-11-23 | Disposition: A | Discharge status: Home / Self Care | Payer: PRIVATE HEALTH INSURANCE | Source: Ambulatory Visit | Attending: Interventional Cardiology | Admitting: Interventional Cardiology

## 2013-11-23 ENCOUNTER — Encounter (HOSPITAL_COMMUNITY): Payer: Self-pay | Admitting: Interventional Cardiology

## 2013-11-23 DIAGNOSIS — Z79899 Other long term (current) drug therapy: Secondary | ICD-10-CM | POA: Diagnosis not present

## 2013-11-23 DIAGNOSIS — I209 Angina pectoris, unspecified: Secondary | ICD-10-CM | POA: Diagnosis present

## 2013-11-23 DIAGNOSIS — I10 Essential (primary) hypertension: Secondary | ICD-10-CM | POA: Diagnosis not present

## 2013-11-23 DIAGNOSIS — Z8249 Family history of ischemic heart disease and other diseases of the circulatory system: Secondary | ICD-10-CM | POA: Diagnosis not present

## 2013-11-23 DIAGNOSIS — R0602 Shortness of breath: Secondary | ICD-10-CM

## 2013-11-23 DIAGNOSIS — E669 Obesity, unspecified: Secondary | ICD-10-CM | POA: Insufficient documentation

## 2013-11-23 DIAGNOSIS — Z6834 Body mass index (BMI) 34.0-34.9, adult: Secondary | ICD-10-CM | POA: Insufficient documentation

## 2013-11-23 DIAGNOSIS — R0789 Other chest pain: Secondary | ICD-10-CM | POA: Diagnosis not present

## 2013-11-23 DIAGNOSIS — Z87891 Personal history of nicotine dependence: Secondary | ICD-10-CM | POA: Diagnosis not present

## 2013-11-23 DIAGNOSIS — I25119 Atherosclerotic heart disease of native coronary artery with unspecified angina pectoris: Secondary | ICD-10-CM

## 2013-11-23 HISTORY — PX: FRACTIONAL FLOW RESERVE WIRE: SHX5839

## 2013-11-23 HISTORY — PX: LEFT HEART CATHETERIZATION WITH CORONARY ANGIOGRAM: SHX5451

## 2013-11-23 LAB — PROTIME-INR
INR: 1.1 ratio — AB (ref 0.8–1.0)
Prothrombin Time: 11.8 s (ref 9.6–13.1)

## 2013-11-23 LAB — CBC
HEMATOCRIT: 50.5 % (ref 39.0–52.0)
HEMOGLOBIN: 17.3 g/dL — AB (ref 13.0–17.0)
MCHC: 34.2 g/dL (ref 30.0–36.0)
MCV: 93.8 fl (ref 78.0–100.0)
Platelets: 229 10*3/uL (ref 150.0–400.0)
RBC: 5.38 Mil/uL (ref 4.22–5.81)
RDW: 12.8 % (ref 11.5–15.5)
WBC: 8.1 10*3/uL (ref 4.0–10.5)

## 2013-11-23 LAB — BASIC METABOLIC PANEL
BUN: 18 mg/dL (ref 6–23)
CHLORIDE: 102 meq/L (ref 96–112)
CO2: 29 meq/L (ref 19–32)
CREATININE: 1.3 mg/dL (ref 0.4–1.5)
Calcium: 9.7 mg/dL (ref 8.4–10.5)
GFR: 65.4 mL/min (ref >60.00)
Glucose, Bld: 89 mg/dL (ref 70–99)
Potassium: 4.4 mEq/L (ref 3.5–5.1)
Sodium: 137 mEq/L (ref 135–145)

## 2013-11-23 LAB — POCT ACTIVATED CLOTTING TIME: ACTIVATED CLOTTING TIME: 242 s

## 2013-11-23 SURGERY — LEFT HEART CATHETERIZATION WITH CORONARY ANGIOGRAM
Anesthesia: LOCAL

## 2013-11-23 MED ORDER — SODIUM CHLORIDE 0.9 % IV SOLN
INTRAVENOUS | Status: DC
Start: 2013-11-24 — End: 2013-11-23

## 2013-11-23 MED ORDER — NITROGLYCERIN 1 MG/10 ML FOR IR/CATH LAB
INTRA_ARTERIAL | Status: AC
  Filled 2013-11-23: qty 10

## 2013-11-23 MED ORDER — ASPIRIN 81 MG PO CHEW: 81.0000 mg | CHEWABLE_TABLET | ORAL | Status: DC

## 2013-11-23 MED ORDER — VERAPAMIL HCL 2.5 MG/ML IV SOLN
INTRAVENOUS | Status: AC
  Filled 2013-11-23: qty 2

## 2013-11-23 MED ORDER — HYDROCODONE-ACETAMINOPHEN 5-325 MG PO TABS
ORAL_TABLET | ORAL | Status: AC
  Administered 2013-11-23: 2
  Filled 2013-11-23: qty 2

## 2013-11-23 MED ORDER — LIDOCAINE HCL (PF) 1 % IJ SOLN
INTRAMUSCULAR | Status: AC
  Filled 2013-11-23: qty 30

## 2013-11-23 MED ORDER — MIDAZOLAM HCL 2 MG/2ML IJ SOLN
INTRAMUSCULAR | Status: AC
  Filled 2013-11-23: qty 2

## 2013-11-23 MED ORDER — ADENOSINE 12 MG/4ML IV SOLN
16.0000 mL | Freq: Once | INTRAVENOUS | Status: DC
  Filled 2013-11-23: qty 16

## 2013-11-23 MED ORDER — ASPIRIN 81 MG PO CHEW
CHEWABLE_TABLET | ORAL | Status: AC
  Filled 2013-11-23: qty 1

## 2013-11-23 MED ORDER — FENTANYL CITRATE 0.05 MG/ML IJ SOLN
INTRAMUSCULAR | Status: AC
  Filled 2013-11-23: qty 2

## 2013-11-23 MED ORDER — HYDROCODONE-ACETAMINOPHEN 5-325 MG PO TABS: 1.0000 | ORAL_TABLET | Freq: Once | ORAL | Status: DC

## 2013-11-23 MED ORDER — ADENOSINE (DIAGNOSTIC) 3 MG/ML IV SOLN
20.0000 mL | Freq: Once | INTRAVENOUS | Status: DC
  Filled 2013-11-23: qty 20

## 2013-11-23 MED ORDER — SODIUM CHLORIDE 0.9 % IJ SOLN: 3.0000 mL | INTRAMUSCULAR | Status: DC | PRN

## 2013-11-23 MED ORDER — HEPARIN (PORCINE) IN NACL 2-0.9 UNIT/ML-% IJ SOLN
INTRAMUSCULAR | Status: AC
Start: 2013-11-23 — End: 2013-11-23
  Filled 2013-11-23: qty 1000

## 2013-11-23 MED ORDER — SODIUM CHLORIDE 0.9 % IJ SOLN: 3.0000 mL | Freq: Two times a day (BID) | INTRAMUSCULAR | Status: DC

## 2013-11-23 MED ORDER — SODIUM CHLORIDE 0.9 % IV SOLN: 250.0000 mL | INTRAVENOUS | Status: DC | PRN

## 2013-11-23 MED ORDER — HEPARIN SODIUM (PORCINE) 1000 UNIT/ML IJ SOLN
INTRAMUSCULAR | Status: AC
  Filled 2013-11-23: qty 1

## 2013-11-23 MED ORDER — LISINOPRIL 10 MG PO TABS: 10.0000 mg | ORAL_TABLET | Freq: Every day | ORAL | Status: DC

## 2013-11-23 NOTE — Progress Notes (Signed)
Per Armanda Heritage may give 1 or 2 vicodin for c/o headache

## 2013-11-23 NOTE — H&P (View-Only) (Signed)
Patient ID: Richard Simpson, male   DOB: 29-Mar-1970, 43 y.o.   MRN: [id number]     Patient ID: Richard Simpson MRN: [id number] DOB/AGE: 18-Sep-1970 30 y.o.   Referring Physician    Reason for Consultation  CP, HTN  HPI: 43 y/o man who had bad indigestion in August.  It woke him from sleep and he went to the hospital.  He was admitted to the hospital.  He ruled out for MI with enzymes.  He had a stress test the next day that was negative and was managed conservatively.  Along with the chest pain, he had sweating.  He has not felt well since that time.    He notes that his exercise tolerance has decreased significantly as well.  His CXR was clear in the past few days.  He has DOE and chest discomfort with walking to the mailbox.  He has episodes of sweating while at rest.  He has ben treated with Klonopin.  BP has stayed high.     He has psoriatic arthritis and is treated with remicaide.    Quit smoking 10 yrs ago.  Uses chewing tobacco.   Paternal uncles have had MI/CAD at young ages (29s).   He has had cholesterol elevation in the past.  No medical treatment for this as of yet.   Current Outpatient Prescriptions  Medication Sig Dispense Refill  . clonazePAM (KLONOPIN) 0.5 MG tablet Take 0.5 mg by mouth 2 (two) times daily as needed for anxiety.      Marland Kitchen ibuprofen (ADVIL,MOTRIN) 800 MG tablet Take 800 mg by mouth every 8 (eight) hours as needed for moderate pain.      Marland Kitchen inFLIXimab (REMICADE) 100 MG injection Inject into the vein.      Marland Kitchen loratadine (CLARITIN) 10 MG tablet Take 10 mg by mouth daily as needed. For allergies       . ranitidine (ZANTAC) 150 MG capsule Take 150 mg by mouth 2 (two) times daily.       No current facility-administered medications for this visit.   Past Medical History  Diagnosis Date  . Arthritis   . Headache(784.0)   . RLS (restless legs syndrome) 10/29/2013  . Primary snoring 10/29/2013  . Obesity 10/29/2013  . Shortness of breath 10/29/2013    Family  History  Problem Relation Age of Onset  . Arthritis Paternal Uncle   . Cancer Maternal Grandfather   . Anesthesia problems Neg Hx   . Hypotension Neg Hx   . Malignant hyperthermia Neg Hx   . Pseudochol deficiency Neg Hx   . Heart attack Neg Hx   . Stroke Neg Hx   . Hypertension Mother   . Hypertension Maternal Grandmother   . Diabetes    . Diabetes Mother   . Diabetes Maternal Grandmother   . Diabetes    . Diabetes Paternal Grandfather     History   Social History  . Marital Status: Married    Spouse Name: Renee    Number of Children: 2  . Years of Education: 12th grade   Occupational History  . Engineer, structural   .     Social History Main Topics  . Smoking status: Former Smoker -- 0.25 packs/day    Types: Cigarettes    Quit date: 09/04/2003  . Smokeless tobacco: Current User    Types: Chew  . Alcohol Use: No     Comment: rare  . Drug Use: No  . Sexual Activity: Yes   Other  Topics Concern  . Not on file   Social History Narrative   Patient lives at home with his wife [name]).    Patient works full time Dentist..   Education high school.   Right handed.   Caffeine one cup of coffee daily and one soda.    Past Surgical History  Procedure Laterality Date  . Gallbladder surgery    . Throat surgery      nodules removed from throat as child  . Cholecystectomy      10 yrs ago-[facility]  . Mass excision  09/09/2010    Procedure: EXCISION MASS;  Surgeon: Arther Abbott, MD;  Location: [facility];  Service: Orthopedics;  Laterality: Left;  Excision Mass Left Long Finger  . Colonoscopy N/A 03/23/2013    Procedure: COLONOSCOPY;  Surgeon: Daneil Dolin, MD;  Location: [facility];  Service: Endoscopy;  Laterality: N/A;  2:15-moved to 1030 Staff notified pt      (Not in a hospital admission)  Review of systems complete and found to be negative unless listed above .  No nausea, vomiting.  No fever chills, No focal weakness,  No palpitations.  Physical  Exam: Filed Vitals:   11/22/13 1622  BP: 150/120  Pulse: 95    Weight: 264 lb (119.75 kg)  Physical exam:  [place]/AT EOMI No JVD, No carotid bruit RRR S1S2  No wheezing Soft. NT, nondistended No edema. 3+ right radial pulse No focal motor or sensory deficits Normal affect  Labs:   Lab Results  Component Value Date   WBC 4.4 09/24/2013   HGB 15.6 09/24/2013   HCT 43.7 09/24/2013   MCV 90.5 09/24/2013   PLT 149* 09/24/2013   No results found for this basename: NA, K, CL, CO2, BUN, CREATININE, CALCIUM, LABALBU, PROT, BILITOT, ALKPHOS, ALT, AST, GLUCOSE,  in the last 168 hours Lab Results  Component Value Date   CKTOTAL 69 10/29/2013   CKMBINDEX 1.5 10/29/2013   TROPONINI <0.30 09/24/2013    No results found for this basename: CHOL   No results found for this basename: HDL   No results found for this basename: LDLCALC   No results found for this basename: TRIG   No results found for this basename: CHOLHDL   No results found for this basename: LDLDIRECT      Radiology: Stress test from September 2015 reviewed which showed no evidence of ischemia EKG:*Normal  ASSESSMENT AND PLAN:  Chest discomfort /SHOB with typical features: Exertional chest discomfort associated with sweating. Minimal exertion is now causing his symptoms. He had a negative exercise treadmill test but symptoms are getting more frequent and with less intensity. We discussed nuclear stress testing versus cardiac catheterization. Since symptoms are getting worse, we would probably not believe a negative nuclear stress test result. Therefore, we'll proceed with cardiac catheterization. The risks and benefits of the procedure were explained to the patient and he is willing to proceed. We will keep him out of work until the procedure is done.  Hypertension: Would plan on starting lisinopril 10 mg daily for hypertension. Since he is likely to have a heart cath in the next few days, we will hold off on lisinopril at this  time and start after the catheterization, to avoid multiple renal insults.  Family history of heart disease: The patient has multiple paternal uncles who have had coronary artery disease and heart attacks.  The patient has no bleeding issues. There are no upcoming procedures. He should tolerate a drug-eluting  stent if required.  Obesity: He is trying to lose weight with dietary modifications. Signed:   Mina Marble, MD, [facility] 11/22/2013, 4:40 PM

## 2013-11-23 NOTE — Discharge Instructions (Signed)
Radial Site Care °Refer to this sheet in the next few weeks. These instructions provide you with information on caring for yourself after your procedure. Your caregiver may also give you more specific instructions. Your treatment has been planned according to current medical practices, but problems sometimes occur. Call your caregiver if you have any problems or questions after your procedure. °HOME CARE INSTRUCTIONS °· You may shower the day after the procedure. Remove the bandage (dressing) and gently wash the site with plain soap and water. Gently pat the site dry. °· Do not apply powder or lotion to the site. °· Do not submerge the affected site in water for 3 to 5 days. °· Inspect the site at least twice daily. °· Do not flex or bend the affected arm for 24 hours. °· No lifting over 5 pounds (2.3 kg) for 5 days after your procedure. °· Do not drive home if you are discharged the same day of the procedure. Have someone else drive you. °· You may drive 24 hours after the procedure unless otherwise instructed by your caregiver. °· Do not operate machinery or power tools for 24 hours. °· A responsible adult should be with you for the first 24 hours after you arrive home. °What to expect: °· Any bruising will usually fade within 1 to 2 weeks. °· Blood that collects in the tissue (hematoma) may be painful to the touch. It should usually decrease in size and tenderness within 1 to 2 weeks. °SEEK IMMEDIATE MEDICAL CARE IF: °· You have unusual pain at the radial site. °· You have redness, warmth, swelling, or pain at the radial site. °· You have drainage (other than a small amount of blood on the dressing). °· You have chills. °· You have a fever or persistent symptoms for more than 72 hours. °· You have a fever and your symptoms suddenly get worse. °· Your arm becomes pale, cool, tingly, or numb. °· You have heavy bleeding from the site. Hold pressure on the site. °Document Released: 02/13/2010 Document Revised:  04/05/2011 Document Reviewed: 02/13/2010 °ExitCare® Patient Information ©2015 [employer]. This information is not intended to replace advice given to you by your health care provider. Make sure you discuss any questions you have with your health care provider. ° °

## 2013-11-23 NOTE — CV Procedure (Signed)
       PROCEDURE:  Left heart catheterization with selective coronary angiography, left ventriculogram.  FFR LAD  INDICATIONS:  Angina  The risks, benefits, and details of the procedure were explained to the patient.  The patient verbalized understanding and wanted to proceed.  Informed written consent was obtained.  PROCEDURE TECHNIQUE:  After Xylocaine anesthesia a 40F slender sheath was placed in the right radial artery with a single anterior needle wall stick.   Right coronary angiography was done using a Judkins R4 guide catheter.  Left coronary angiography was done using a Judkins L3.5 guide catheter.  Left ventriculography was done using a pigtail catheter.  A TR band was used for hemostasis.   CONTRAST:  Total of 160 cc.  COMPLICATIONS:  None.    HEMODYNAMICS:  Aortic pressure was 138/96; LV pressure was 136/9; LVEDP 15.  There was no gradient between the left ventricle and aorta.    ANGIOGRAPHIC DATA:   The left main coronary artery is widely patent.  The left anterior descending artery is a large vessel. In the proximal vessel, there is mild atherosclerosis. Just after a large first septal perforator, there is a moderate, focal lesion. There is mild disease after the origin of a large diagonal vessel. The remainder of the mid to distal LAD appears widely patent. The large diagonal and large septal vessels are widely patent.  The left circumflex artery is a large vessel. The first obtuse marginal is large. There is only minimal atherosclerosis in the circumflex system.  The right coronary artery is a large, dominant vessel. In the mid vessel, there is mild atherosclerosis. The posterior lateral artery is small but patent. The posterior descending artery is large and widely patent.  LEFT VENTRICULOGRAM:  Left ventricular angiogram was not done.  Recent echocardiogram had shown normal left ventricular function.  LVEDP was 15 mmHg.    ABDOMINAL AORTOGRAM: There is no abdominal aortic  aneurysm. There are bilateral single renal arteries which are widely patent.  INTERVENTIONAL NARRATIVE: CLS 3.5 guide catheter was used. A pro-water wire was placed across the area of disease in the LAD and into the large first diagonal. Due to significant tortuosity in the ostial LAD, it was difficult to direct the wire down into the mid LAD. Guide support was also tenuous due to significant tortuosity in the subclavian. An ACIST catheter was advanced. Resting FFR was 0.96. After maximal hyperemia with IV adenosine, FFR decreased to 0.90. Follow-up angiography showed no significant change. The patient tolerated the procedure well.  IMPRESSIONS:  1. Widely patent left main coronary artery. 2. Moderate disease in the proximal portion of the mid left anterior descending artery. This was not significant by FFR.. 3. Widely patent left circumflex artery and its branches. 4. Mild disease in the mid right coronary artery. 5. Left ventricular systolic function not assessed.  LVEDP 15 mmHg.  Ejection fraction normal by prior echocardiogram. 6.  No abdominal aortic aneurysm. No renal artery stenosis.  RECOMMENDATION:  Continue medical therapy.  He needs aggressive medical therapy and risk factor modification. Will start lisinopril 10 mg daily for blood pressure control. He will start day after tomorrow. Follow-up in the office for blood pressure checks.

## 2013-11-23 NOTE — Progress Notes (Signed)
Pt has HTN/ see flow sheet, cp 8/10, I contacted cath lab/ Aaron Edelman to inform him EKG was done and was NSR. Cp and HTN was the same x 2 weeks and is the same as was seen yesterday in cardiology office.

## 2013-11-23 NOTE — Interval H&P Note (Signed)
Cath Lab Visit (complete for each Cath Lab visit)  Clinical Evaluation Leading to the Procedure:   ACS: Yes.    Non-ACS:    Anginal Classification: CCS IV  Anti-ischemic medical therapy: No Therapy  Non-Invasive Test Results: Low-risk stress test findings: cardiac mortality <1%/year  Prior CABG: No previous CABG    Now with resting pain starting this AM.  Currently 7/10.    History and Physical Interval Note:  11/23/2013 12:17 PM  Richard Simpson  has presented today for surgery, with the diagnosis of cp  The various methods of treatment have been discussed with the patient and family. After consideration of risks, benefits and other options for treatment, the patient has consented to  Procedure(s): LEFT HEART CATHETERIZATION WITH CORONARY ANGIOGRAM (N/A) as a surgical intervention .  The patient's history has been reviewed, patient examined, no change in status, stable for surgery.  I have reviewed the patient's chart and labs.  Questions were answered to the patient's satisfaction.     Muriel Hannold S.

## 2013-11-26 ENCOUNTER — Telehealth: Payer: Self-pay | Admitting: Interventional Cardiology

## 2013-11-26 DIAGNOSIS — I159 Secondary hypertension, unspecified: Secondary | ICD-10-CM

## 2013-11-26 NOTE — Telephone Encounter (Signed)
New message          Pt would like a call from [facility] / pt did not disclose any info

## 2013-11-26 NOTE — Telephone Encounter (Signed)
Called patient back. He had a heart cath completed last week and has 2 issues. 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 Wants to pick up letter on 11/6. #2 States he needs lab work completed on 11/6. Orders needed ( possible BMET) ?  Advised will send message to Dr.Varanasi and call him back.

## 2013-11-27 NOTE — Telephone Encounter (Signed)
Check BMet for hypertension since lisinopril was started.   May return to work without restriction on 12/03/13.

## 2013-11-28 ENCOUNTER — Encounter: Payer: Self-pay | Admitting: *Deleted

## 2013-11-28 NOTE — Telephone Encounter (Signed)
Called pt per Dr. Irish Lack.  Schedule him for BMET for this Fri 11/6.  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 Also will send note to schedulers to make him an appointment for f/u post cath with PA or NP.

## 2013-11-30 ENCOUNTER — Other Ambulatory Visit (INDEPENDENT_AMBULATORY_CARE_PROVIDER_SITE_OTHER): Payer: PRIVATE HEALTH INSURANCE | Admitting: *Deleted

## 2013-11-30 DIAGNOSIS — I159 Secondary hypertension, unspecified: Secondary | ICD-10-CM

## 2013-11-30 LAB — BASIC METABOLIC PANEL
BUN: 18 mg/dL (ref 6–23)
CO2: 28 mEq/L (ref 19–32)
Calcium: 9.2 mg/dL (ref 8.4–10.5)
Chloride: 106 mEq/L (ref 96–112)
Creatinine, Ser: 1.1 mg/dL (ref 0.4–1.5)
GFR: 81.29 mL/min (ref >60.00)
Glucose, Bld: 89 mg/dL (ref 70–99)
POTASSIUM: 4.1 meq/L (ref 3.5–5.1)
Sodium: 139 mEq/L (ref 135–145)

## 2013-12-04 ENCOUNTER — Encounter: Payer: Self-pay | Admitting: Nurse Practitioner

## 2013-12-04 ENCOUNTER — Ambulatory Visit (INDEPENDENT_AMBULATORY_CARE_PROVIDER_SITE_OTHER): Payer: PRIVATE HEALTH INSURANCE | Admitting: Nurse Practitioner

## 2013-12-04 VITALS — BP 140/100 | HR 83 | Ht 73.0 in | Wt 264.1 lb

## 2013-12-04 DIAGNOSIS — Z9889 Other specified postprocedural states: Secondary | ICD-10-CM

## 2013-12-04 DIAGNOSIS — I10 Essential (primary) hypertension: Secondary | ICD-10-CM

## 2013-12-04 DIAGNOSIS — I259 Chronic ischemic heart disease, unspecified: Secondary | ICD-10-CM

## 2013-12-04 MED ORDER — AMLODIPINE BESYLATE 5 MG PO TABS: 5.0000 mg | ORAL_TABLET | Freq: Every day | ORAL | Status: DC

## 2013-12-04 MED ORDER — NITROGLYCERIN 0.4 MG SL SUBL: 0.4000 mg | SUBLINGUAL_TABLET | SUBLINGUAL | Status: DC | PRN

## 2013-12-04 MED ORDER — ASPIRIN EC 81 MG PO TBEC: 81.0000 mg | DELAYED_RELEASE_TABLET | Freq: Every day | ORAL | Status: AC

## 2013-12-04 NOTE — Progress Notes (Signed)
Richard Simpson Date of Birth: 12-Apr-1970 Medical Record #[id number]  History of Present Illness: Richard Simpson is a 43 y/o man who is seen back for a post cath visit - seen for Dr. Irish Lack. He has had recent admission to the hospital for "bad indigestion" back in August. He had a treadmill stress test the next day that was negative and was managed conservatively. His other issues include DOE, psoriatic arthritis on Remicaide, past tobacco abuse and HLD. Works as a Engineer, structural.   He did not do well with medical therapy - exercise tolerance decreased significantly, he continued to have chest pain and DOE. BP was staying high. Subsequently referred for cardiac cath - see below. ACE started after his cath.  Aggressive CV risk factor modification encouraged.   Comes in today. Here alone. He continues to not do well. He was out for 1 1/2 weeks post cath - still has DOE and chest tightness. BP labile. Went back to work yesterday. He is fatigued. He is very motivated to make changes in regards to his health. Not fasting today - not on lipids. No aspirin. No NTG.   Current Outpatient Prescriptions  Medication Sig Dispense Refill  . clonazePAM (KLONOPIN) 0.5 MG tablet Take 0.5 mg by mouth 2 (two) times daily as needed for anxiety.    Marland Kitchen ibuprofen (ADVIL,MOTRIN) 800 MG tablet Take 800 mg by mouth every 8 (eight) hours as needed for moderate pain.    Marland Kitchen inFLIXimab (REMICADE) 100 MG injection Inject into the vein.    Marland Kitchen lisinopril (PRINIVIL) 10 MG tablet Take 1 tablet (10 mg total) by mouth daily. 30 tablet 11  . loratadine (CLARITIN) 10 MG tablet Take 10 mg by mouth daily as needed. For allergies     . ranitidine (ZANTAC) 150 MG capsule Take 150 mg by mouth 2 (two) times daily.     No current facility-administered medications for this visit.    Allergies  Allergen Reactions  . Prednisone     Mood swings    Past Medical History  Diagnosis Date  . Arthritis   . Headache(784.0)   . RLS (restless  legs syndrome) 10/29/2013  . Primary snoring 10/29/2013  . Obesity 10/29/2013  . Shortness of breath 10/29/2013  . Hypertension     Past Surgical History  Procedure Laterality Date  . Gallbladder surgery    . Throat surgery      nodules removed from throat as child  . Cholecystectomy      10 yrs ago-jenkins  . Mass excision  09/09/2010    Procedure: EXCISION MASS;  Surgeon: Arther Abbott, MD;  Location: [facility];  Service: Orthopedics;  Laterality: Left;  Excision Mass Left Long Finger  . Colonoscopy N/A 03/23/2013    Procedure: COLONOSCOPY;  Surgeon: Daneil Dolin, MD;  Location: [facility];  Service: Endoscopy;  Laterality: N/A;  2:15-moved to 1030 Staff notified pt    History  Smoking status  . Former Smoker -- 0.25 packs/day  . Types: Cigarettes  . Quit date: 09/04/2003  Smokeless tobacco  . Current User  . Types: Chew    History  Alcohol Use No    Comment: rare    Family History  Problem Relation Age of Onset  . Arthritis Paternal Uncle   . Cancer Maternal Grandfather   . Anesthesia problems Neg Hx   . Hypotension Neg Hx   . Malignant hyperthermia Neg Hx   . Pseudochol deficiency Neg Hx   . Heart attack Neg  Hx   . Stroke Neg Hx   . Hypertension Mother   . Hypertension Maternal Grandmother   . Diabetes    . Diabetes Mother   . Diabetes Maternal Grandmother   . Diabetes    . Diabetes Paternal Grandfather     Review of Systems: The review of systems is per the HPI.  All other systems were reviewed and are negative.  Physical Exam: BP 140/100 mmHg  Pulse 83  Ht 6\' 1"  (1.854 m)  Wt 264 lb 1.9 oz (119.804 kg)  BMI 34.85 kg/m2  SpO2 97%  BP is 145/102 by me.  Patient is very pleasant and in no acute distress. He is quite musculature. Skin is warm and dry. Color is normal.  HEENT is unremarkable. Normocephalic/atraumatic. PERRL. Sclera are nonicteric. Neck is supple. No masses. No JVD. Lungs are clear. Cardiac exam shows a regular rate and rhythm. Abdomen  is soft. Extremities are without edema. Gait and ROM are intact. No gross neurologic deficits noted.  Wt Readings from Last 3 Encounters:  12/04/13 264 lb 1.9 oz (119.804 kg)  11/23/13 264 lb (119.75 kg)  11/22/13 264 lb (119.75 kg)    LABORATORY DATA/PROCEDURES:  Lab Results  Component Value Date   WBC 8.1 11/22/2013   HGB 17.3* 11/22/2013   HCT 50.5 11/22/2013   PLT 229.0 11/22/2013   GLUCOSE 89 11/30/2013   ALT 46 09/23/2013   AST 30 09/23/2013   NA 139 11/30/2013   K 4.1 11/30/2013   CL 106 11/30/2013   CREATININE 1.1 11/30/2013   BUN 18 11/30/2013   CO2 28 11/30/2013   TSH 1.580 09/23/2013   INR 1.1* 11/22/2013    BNP (last 3 results) No results for input(s): PROBNP in the last 8760 hours.    PROCEDURE: Left heart catheterization with selective coronary angiography, left ventriculogram. FFR LAD  INDICATIONS: Angina  The risks, benefits, and details of the procedure were explained to the patient. The patient verbalized understanding and wanted to proceed. Informed written consent was obtained.  PROCEDURE TECHNIQUE: After Xylocaine anesthesia a 4F slender sheath was placed in the right radial artery with a single anterior needle wall stick. Right coronary angiography was done using a Judkins R4 guide catheter. Left coronary angiography was done using a Judkins L3.5 guide catheter. Left ventriculography was done using a pigtail catheter. A TR band was used for hemostasis.  CONTRAST: Total of 160 cc.  COMPLICATIONS: None.   HEMODYNAMICS: Aortic pressure was 138/96; LV pressure was 136/9; LVEDP 15. There was no gradient between the left ventricle and aorta.   ANGIOGRAPHIC DATA: The left main coronary artery is widely patent.  The left anterior descending artery is a large vessel. In the proximal vessel, there is mild atherosclerosis. Just after a large first septal perforator, there is a moderate, focal lesion. There is mild disease after the  origin of a large diagonal vessel. The remainder of the mid to distal LAD appears widely patent. The large diagonal and large septal vessels are widely patent.  The left circumflex artery is a large vessel. The first obtuse marginal is large. There is only minimal atherosclerosis in the circumflex system.  The right coronary artery is a large, dominant vessel. In the mid vessel, there is mild atherosclerosis. The posterior lateral artery is small but patent. The posterior descending artery is large and widely patent.  LEFT VENTRICULOGRAM: Left ventricular angiogram was not done. Recent echocardiogram had shown normal left ventricular function. LVEDP was 15 mmHg.  ABDOMINAL AORTOGRAM: There is no abdominal aortic aneurysm. There are bilateral single renal arteries which are widely patent.  INTERVENTIONAL NARRATIVE: CLS 3.5 guide catheter was used. A pro-water wire was placed across the area of disease in the LAD and into the large first diagonal. Due to significant tortuosity in the ostial LAD, it was difficult to direct the wire down into the mid LAD. Guide support was also tenuous due to significant tortuosity in the subclavian. An ACIST catheter was advanced. Resting FFR was 0.96. After maximal hyperemia with IV adenosine, FFR decreased to 0.90. Follow-up angiography showed no significant change. The patient tolerated the procedure well.  IMPRESSIONS:  1. Widely patent left main coronary artery. 2. Moderate disease in the proximal portion of the mid left anterior descending artery. This was not significant by FFR.. 3. Widely patent left circumflex artery and its branches. 4. Mild disease in the mid right coronary artery. 5. Left ventricular systolic function not assessed. LVEDP 15 mmHg. Ejection fraction normal by prior echocardiogram. 6. No abdominal aortic aneurysm. No renal artery stenosis.  RECOMMENDATION: Continue medical therapy. He needs aggressive medical therapy and risk  factor modification. Will start lisinopril 10 mg daily for blood pressure control. He will start day after tomorrow. Follow-up in the office for blood pressure checks.   Assessment / Plan:  1. S/P cardiac cath - needs aggressive medical therapy and risk factor modification.  2. CAD - continues to have chest pain/shortness of breath and fatigue - adding Norvasc 5 mg a day. Adding aspirin 81 mg a day. NTG prn. See back in about 10 days to recheck. Not to return to work until we get his symptoms to settle down.  3. HTN - Norvasc added today  4. HLD -  Will check fasting labs on return and then start statin.   5. Obesity  6. Psoriatic arthritis - on remicade  Further disposition to follow.   Patient is agreeable to this plan and will call if any problems develop in the interim.   Burtis Junes, RN, [facility] [address] [place], [place]  [id number] [phone]

## 2013-12-04 NOTE — Patient Instructions (Addendum)
Stay on your current medicines but I am adding Norvasc 5 mg a day  I am adding Aspirin 81 mg a day  I am adding NTG to use if needed - Use your NTG under your tongue for recurrent chest pain. May take one tablet every 5 minutes. If you are still having discomfort after 3 tablets in 15 minutes, call 911.  See Dr. Irish Lack in 2 weeks from today  Call the [place] office at [phone] if you have any questions, problems or concerns.

## 2013-12-18 ENCOUNTER — Ambulatory Visit (INDEPENDENT_AMBULATORY_CARE_PROVIDER_SITE_OTHER): Payer: PRIVATE HEALTH INSURANCE | Admitting: Interventional Cardiology

## 2013-12-18 ENCOUNTER — Encounter: Payer: Self-pay | Admitting: Interventional Cardiology

## 2013-12-18 VITALS — BP 122/88 | HR 89 | Ht 73.0 in | Wt 261.8 lb

## 2013-12-18 DIAGNOSIS — R0602 Shortness of breath: Secondary | ICD-10-CM

## 2013-12-18 DIAGNOSIS — R5382 Chronic fatigue, unspecified: Secondary | ICD-10-CM

## 2013-12-18 DIAGNOSIS — I2510 Atherosclerotic heart disease of native coronary artery without angina pectoris: Secondary | ICD-10-CM | POA: Insufficient documentation

## 2013-12-18 DIAGNOSIS — R5383 Other fatigue: Secondary | ICD-10-CM | POA: Insufficient documentation

## 2013-12-18 DIAGNOSIS — I10 Essential (primary) hypertension: Secondary | ICD-10-CM

## 2013-12-18 LAB — BRAIN NATRIURETIC PEPTIDE: PRO B NATRI PEPTIDE: 10 pg/mL (ref 0.0–100.0)

## 2013-12-18 NOTE — Patient Instructions (Signed)
Your physician recommends that you continue on your current medications as directed. Please refer to the Current Medication list given to you today. Your physician recommends that you return for lab work today. (BNP) Your physician has recommended that you have a pulmonary function test. Pulmonary Function Tests are a group of tests that measure how well air moves in and out of your lungs.  Please reschedule your sleep study that was ordered by your PCP.

## 2013-12-18 NOTE — Progress Notes (Signed)
Patient ID: Richard Simpson, male   DOB: Nov 16, 1970, 43 y.o.   MRN: [id number]    [place] [place], [place]  [address] Phone: [phone] Fax:  [phone]  Date:  12/18/2013   ID:  Richard Simpson, DOB [birth year], MRN [id number]  PCP:  Purvis Kilts, MD      History of Present Illness: Richard Simpson is a 43 y.o. male who had significant dyspnea on exertion. He had a cardiac cath showing nonobstructive coronary artery disease. He had elevated blood pressure. This improved with lisinopril and subsequently with adding amlodipine. His blood pressures have improved. He continues to be fatigue. He has no stamina. Things which previously were previously easy for him are now quite difficult such as yard work. 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 He feels that there is no option for him to work on light duty just doing office work.  He was referred for a sleep study but canceled this. He denies any chest pain but still has shortness of breath after exertion. He has issues with psoriatic arthritis and takes Remicade for this condition.   Wt Readings from Last 3 Encounters:  12/18/13 261 lb 12.8 oz (118.752 kg)  12/04/13 264 lb 1.9 oz (119.804 kg)  11/23/13 264 lb (119.75 kg)     Past Medical History  Diagnosis Date  . Arthritis   . Headache(784.0)   . RLS (restless legs syndrome) 10/29/2013  . Primary snoring 10/29/2013  . Obesity 10/29/2013  . Shortness of breath 10/29/2013  . Hypertension     Current Outpatient Prescriptions  Medication Sig Dispense Refill  . amLODipine (NORVASC) 5 MG tablet Take 1 tablet (5 mg total) by mouth daily. 30 tablet 3  . aspirin EC 81 MG tablet Take 1 tablet (81 mg total) by mouth daily. 30 tablet 3  . clonazePAM (KLONOPIN) 0.5 MG tablet Take 0.5 mg by mouth 2 (two) times daily as needed for anxiety.    . inFLIXimab (REMICADE) 100 MG injection Inject into the vein.    Marland Kitchen lisinopril  (PRINIVIL) 10 MG tablet Take 1 tablet (10 mg total) by mouth daily. 30 tablet 11  . loratadine (CLARITIN) 10 MG tablet Take 10 mg by mouth daily as needed. For allergies     . ranitidine (ZANTAC) 150 MG capsule Take 150 mg by mouth 2 (two) times daily.    Marland Kitchen ibuprofen (ADVIL,MOTRIN) 800 MG tablet Take 800 mg by mouth every 8 (eight) hours as needed for moderate pain.    . nitroGLYCERIN (NITROSTAT) 0.4 MG SL tablet Place 1 tablet (0.4 mg total) under the tongue every 5 (five) minutes as needed for chest pain. (Patient not taking: Reported on 12/18/2013) 25 tablet 3   No current facility-administered medications for this visit.    Allergies:    Allergies  Allergen Reactions  . Prednisone     Mood swings    Social History:  The patient  reports that he quit smoking about 10 years ago. His smoking use included Cigarettes. He smoked 0.25 packs per day. His smokeless tobacco use includes Chew. He reports that he does not drink alcohol or use illicit drugs.   Family History:  The patient's family history includes Arthritis in his paternal uncle; Cancer in his maternal grandfather; Diabetes in his maternal grandmother, mother, paternal grandfather, and other family members; Hypertension in his maternal grandmother and mother. There is no history of Anesthesia  problems, Hypotension, Malignant hyperthermia, Pseudochol deficiency, Heart attack, or Stroke.   ROS:  Please see the history of present illness.  No nausea, vomiting.  No fevers, chills.  No focal weakness.  No dysuria.   All other systems reviewed and negative.   PHYSICAL EXAM: VS:  BP 122/88 mmHg  Pulse 89  Ht 6\' 1"  (1.854 m)  Wt 261 lb 12.8 oz (118.752 kg)  BMI 34.55 kg/m2 General: Well developed, well nourished, in no acute distress HEENT: normal Neck: no JVD, no carotid bruits Cardiac:  normal S1, S2; RRR;  Lungs:  clear to auscultation bilaterally, no wheezing, rhonchi or rales Abd: soft, nontender, no hepatomegaly Ext: no  edema2+ pulse in the right wrist Skin: warm and dry Neuro:   no focal abnormalities noted Psych: anx affect    ASSESSMENT AND PLAN:  1. CAD: Mild, nonobstructive. This does not explain his symptoms of shortness of breath and fatigue. 2. Shortness of breath: We'll check BNP. Check pulmonary function tests as well to rule out any lung disease. 3. Fatigue: I recommend that he reschedule his sleep study. Sleep apnea would be a diagnosis that ties together both the fatigue and the increased blood pressure. 4. Hypertension: Continue current medicines. If his blood pressure does begin to drop, would cut back on the amlodipine first. No need to increase any medications today. 5. We had a long discussion about returning to work. 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 I told him I'll give him a note so that he could work on light duty but he said that either he works full strength or not at all. He thinks it is not practical to have light duty restrictions. He will also follow-up with his rheumatologist. Will await the test results. It may end up being another one of his doctors that allows him to go back to work. There is no significant cardiac abnormality that has been discovered that would explain his symptoms or would keep him out of work.  His PMD recommended sleep study which I think is reasonable.  Anxiety was also suggested as a cause.  I don't think he has a carduac cause of his sx at this time.  He needs f/u with his other doctors. 6. Prn f/u for now.  Signed, Mina Marble, MD, [facility] 12/18/2013 12:40 PM

## 2013-12-19 ENCOUNTER — Telehealth: Payer: Self-pay

## 2013-12-19 NOTE — Telephone Encounter (Signed)
Patient walked in to clinic asking for FMLA papers.  Papers found in medical records and given to patient.  Instructed patient to call our office with any questions.

## 2013-12-28 ENCOUNTER — Ambulatory Visit (INDEPENDENT_AMBULATORY_CARE_PROVIDER_SITE_OTHER): Payer: PRIVATE HEALTH INSURANCE | Admitting: Internal Medicine

## 2013-12-28 DIAGNOSIS — R0602 Shortness of breath: Secondary | ICD-10-CM

## 2013-12-28 LAB — PULMONARY FUNCTION TEST
DL/VA % PRED: 115 %
DL/VA: 5.54 ml/min/mmHg/L
DLCO UNC: 31.22 ml/min/mmHg
DLCO unc % pred: 88 %
FEF 25-75 Post: 6.04 L/sec
FEF 25-75 Pre: 4.99 L/sec
FEF2575-%Change-Post: 21 %
FEF2575-%PRED-PRE: 122 %
FEF2575-%Pred-Post: 148 %
FEV1-%CHANGE-POST: 5 %
FEV1-%Pred-Post: 87 %
FEV1-%Pred-Pre: 82 %
FEV1-Post: 3.9 L
FEV1-Pre: 3.68 L
FEV1FVC-%Change-Post: 1 %
FEV1FVC-%Pred-Pre: 111 %
FEV6-%Change-Post: 3 %
FEV6-%PRED-POST: 79 %
FEV6-%Pred-Pre: 76 %
FEV6-POST: 4.36 L
FEV6-Pre: 4.2 L
FEV6FVC-%Pred-Post: 103 %
FEV6FVC-%Pred-Pre: 103 %
FVC-%Change-Post: 3 %
FVC-%PRED-PRE: 74 %
FVC-%Pred-Post: 77 %
FVC-POST: 4.36 L
FVC-Pre: 4.2 L
POST FEV6/FVC RATIO: 100 %
PRE FEV6/FVC RATIO: 100 %
Post FEV1/FVC ratio: 90 %
Pre FEV1/FVC ratio: 88 %
RV % pred: 68 %
RV: 1.36 L
TLC % pred: 76 %
TLC: 5.66 L

## 2013-12-28 NOTE — Progress Notes (Signed)
PFT done today. 

## 2013-12-31 ENCOUNTER — Telehealth: Payer: Self-pay | Admitting: Interventional Cardiology

## 2014-01-03 ENCOUNTER — Encounter (HOSPITAL_COMMUNITY): Payer: Self-pay | Admitting: Interventional Cardiology

## 2014-03-11 ENCOUNTER — Ambulatory Visit (HOSPITAL_BASED_OUTPATIENT_CLINIC_OR_DEPARTMENT_OTHER): Payer: PRIVATE HEALTH INSURANCE

## 2014-04-11 ENCOUNTER — Other Ambulatory Visit: Payer: Self-pay

## 2014-04-11 ENCOUNTER — Other Ambulatory Visit: Payer: Self-pay | Admitting: Nurse Practitioner

## 2014-04-11 MED ORDER — AMLODIPINE BESYLATE 5 MG PO TABS: 5.0000 mg | ORAL_TABLET | Freq: Every day | ORAL | Status: DC

## 2014-05-15 ENCOUNTER — Encounter (HOSPITAL_BASED_OUTPATIENT_CLINIC_OR_DEPARTMENT_OTHER): Payer: PRIVATE HEALTH INSURANCE

## 2014-06-25 ENCOUNTER — Ambulatory Visit (HOSPITAL_COMMUNITY)
Admission: RE | Admit: 2014-06-25 | Discharge: 2014-06-25 | Disposition: A | Discharge status: Home / Self Care | Payer: PRIVATE HEALTH INSURANCE | Source: Ambulatory Visit | Attending: Rheumatology | Admitting: Rheumatology

## 2014-06-25 ENCOUNTER — Encounter (HOSPITAL_COMMUNITY): Payer: Self-pay

## 2014-06-25 ENCOUNTER — Other Ambulatory Visit (HOSPITAL_COMMUNITY): Payer: Self-pay | Admitting: Rheumatology

## 2014-06-25 DIAGNOSIS — R0602 Shortness of breath: Secondary | ICD-10-CM

## 2014-06-25 MED ORDER — IOHEXOL 350 MG/ML SOLN
100.0000 mL | Freq: Once | INTRAVENOUS | Status: AC | PRN
  Administered 2014-06-25: 100 mL via INTRAVENOUS

## 2014-06-26 DIAGNOSIS — I255 Ischemic cardiomyopathy: Secondary | ICD-10-CM

## 2014-06-26 DIAGNOSIS — I214 Non-ST elevation (NSTEMI) myocardial infarction: Secondary | ICD-10-CM

## 2014-06-26 HISTORY — DX: Ischemic cardiomyopathy: I25.5

## 2014-06-26 HISTORY — DX: Non-ST elevation (NSTEMI) myocardial infarction: I21.4

## 2014-07-04 ENCOUNTER — Inpatient Hospital Stay (HOSPITAL_COMMUNITY)
Admission: EM | Admit: 2014-07-04 | Discharge: 2014-07-07 | DRG: 247 | Disposition: A | Discharge status: Home / Self Care | Payer: PRIVATE HEALTH INSURANCE | Attending: Cardiology | Admitting: Cardiology

## 2014-07-04 ENCOUNTER — Emergency Department (HOSPITAL_COMMUNITY): Payer: PRIVATE HEALTH INSURANCE

## 2014-07-04 ENCOUNTER — Encounter (HOSPITAL_COMMUNITY): Payer: Self-pay | Admitting: *Deleted

## 2014-07-04 ENCOUNTER — Other Ambulatory Visit (HOSPITAL_COMMUNITY): Payer: Self-pay

## 2014-07-04 DIAGNOSIS — Z791 Long term (current) use of non-steroidal anti-inflammatories (NSAID): Secondary | ICD-10-CM

## 2014-07-04 DIAGNOSIS — Z79899 Other long term (current) drug therapy: Secondary | ICD-10-CM

## 2014-07-04 DIAGNOSIS — I10 Essential (primary) hypertension: Secondary | ICD-10-CM | POA: Diagnosis present

## 2014-07-04 DIAGNOSIS — Z6834 Body mass index (BMI) 34.0-34.9, adult: Secondary | ICD-10-CM

## 2014-07-04 DIAGNOSIS — R079 Chest pain, unspecified: Secondary | ICD-10-CM | POA: Diagnosis not present

## 2014-07-04 DIAGNOSIS — I25119 Atherosclerotic heart disease of native coronary artery with unspecified angina pectoris: Secondary | ICD-10-CM

## 2014-07-04 DIAGNOSIS — Z8249 Family history of ischemic heart disease and other diseases of the circulatory system: Secondary | ICD-10-CM

## 2014-07-04 DIAGNOSIS — I214 Non-ST elevation (NSTEMI) myocardial infarction: Principal | ICD-10-CM | POA: Diagnosis present

## 2014-07-04 DIAGNOSIS — M069 Rheumatoid arthritis, unspecified: Secondary | ICD-10-CM | POA: Diagnosis present

## 2014-07-04 DIAGNOSIS — I252 Old myocardial infarction: Secondary | ICD-10-CM

## 2014-07-04 DIAGNOSIS — I2510 Atherosclerotic heart disease of native coronary artery without angina pectoris: Secondary | ICD-10-CM | POA: Diagnosis present

## 2014-07-04 DIAGNOSIS — I255 Ischemic cardiomyopathy: Secondary | ICD-10-CM | POA: Diagnosis present

## 2014-07-04 DIAGNOSIS — Z955 Presence of coronary angioplasty implant and graft: Secondary | ICD-10-CM

## 2014-07-04 DIAGNOSIS — E785 Hyperlipidemia, unspecified: Secondary | ICD-10-CM | POA: Diagnosis present

## 2014-07-04 DIAGNOSIS — R0600 Dyspnea, unspecified: Secondary | ICD-10-CM | POA: Insufficient documentation

## 2014-07-04 DIAGNOSIS — Z888 Allergy status to other drugs, medicaments and biological substances status: Secondary | ICD-10-CM

## 2014-07-04 DIAGNOSIS — I249 Acute ischemic heart disease, unspecified: Secondary | ICD-10-CM

## 2014-07-04 DIAGNOSIS — G2581 Restless legs syndrome: Secondary | ICD-10-CM | POA: Diagnosis present

## 2014-07-04 DIAGNOSIS — Z9861 Coronary angioplasty status: Secondary | ICD-10-CM

## 2014-07-04 DIAGNOSIS — E669 Obesity, unspecified: Secondary | ICD-10-CM | POA: Diagnosis present

## 2014-07-04 DIAGNOSIS — Z7982 Long term (current) use of aspirin: Secondary | ICD-10-CM

## 2014-07-04 DIAGNOSIS — K219 Gastro-esophageal reflux disease without esophagitis: Secondary | ICD-10-CM | POA: Diagnosis present

## 2014-07-04 DIAGNOSIS — F17220 Nicotine dependence, chewing tobacco, uncomplicated: Secondary | ICD-10-CM | POA: Diagnosis present

## 2014-07-04 HISTORY — DX: Rheumatoid arthritis, unspecified: M06.9

## 2014-07-04 HISTORY — DX: Ischemic cardiomyopathy: I25.5

## 2014-07-04 LAB — COMPREHENSIVE METABOLIC PANEL
ALT: 51 U/L (ref 17–63)
ANION GAP: 9 (ref 5–15)
AST: 35 U/L (ref 15–41)
Albumin: 4.2 g/dL (ref 3.5–5.0)
Alkaline Phosphatase: 55 U/L (ref 38–126)
BUN: 18 mg/dL (ref 6–20)
CO2: 23 mmol/L (ref 22–32)
Calcium: 9.1 mg/dL (ref 8.9–10.3)
Chloride: 107 mmol/L (ref 101–111)
Creatinine, Ser: 0.95 mg/dL (ref 0.61–1.24)
GFR calc Af Amer: >60 mL/min (ref >60)
GFR calc non Af Amer: >60 mL/min (ref >60)
GLUCOSE: 123 mg/dL — AB (ref 65–99)
POTASSIUM: 3.5 mmol/L (ref 3.5–5.1)
SODIUM: 139 mmol/L (ref 135–145)
Total Bilirubin: 1 mg/dL (ref 0.3–1.2)
Total Protein: 6.8 g/dL (ref 6.5–8.1)

## 2014-07-04 LAB — CBC
HCT: 42.3 % (ref 39.0–52.0)
HEMOGLOBIN: 14.9 g/dL (ref 13.0–17.0)
MCH: 32.3 pg (ref 26.0–34.0)
MCHC: 35.2 g/dL (ref 30.0–36.0)
MCV: 91.6 fL (ref 78.0–100.0)
Platelets: 170 10*3/uL (ref 150–400)
RBC: 4.62 MIL/uL (ref 4.22–5.81)
RDW: 12.5 % (ref 11.5–15.5)
WBC: 4.6 10*3/uL (ref 4.0–10.5)

## 2014-07-04 LAB — APTT: APTT: 31 s (ref 24–37)

## 2014-07-04 LAB — I-STAT TROPONIN, ED: Troponin i, poc: 0.04 ng/mL (ref 0.00–0.08)

## 2014-07-04 LAB — PROTIME-INR
INR: 1.09 (ref 0.00–1.49)
PROTHROMBIN TIME: 14.3 s (ref 11.6–15.2)

## 2014-07-04 MED ORDER — NITROGLYCERIN 0.4 MG SL SUBL: 0.4000 mg | SUBLINGUAL_TABLET | SUBLINGUAL | Status: DC | PRN

## 2014-07-04 MED ORDER — LISINOPRIL 10 MG PO TABS: 10.0000 mg | ORAL_TABLET | Freq: Every day | ORAL | Status: DC

## 2014-07-04 MED ORDER — SODIUM CHLORIDE 0.9 % IV SOLN
1000.0000 mL | INTRAVENOUS | Status: DC
  Administered 2014-07-04: 1000 mL via INTRAVENOUS

## 2014-07-04 MED ORDER — MORPHINE SULFATE 4 MG/ML IJ SOLN
4.0000 mg | Freq: Once | INTRAMUSCULAR | Status: AC
  Administered 2014-07-04: 4 mg via INTRAVENOUS
  Filled 2014-07-04: qty 1

## 2014-07-04 MED ORDER — HEPARIN BOLUS VIA INFUSION
4000.0000 [IU] | Freq: Once | INTRAVENOUS | Status: AC
  Administered 2014-07-04: 4000 [IU] via INTRAVENOUS

## 2014-07-04 MED ORDER — ONDANSETRON HCL 4 MG/2ML IJ SOLN: 4.0000 mg | Freq: Four times a day (QID) | INTRAMUSCULAR | Status: DC | PRN

## 2014-07-04 MED ORDER — ASPIRIN 81 MG PO CHEW
324.0000 mg | CHEWABLE_TABLET | Freq: Once | ORAL | Status: AC
  Administered 2014-07-04: 324 mg via ORAL
  Filled 2014-07-04: qty 4

## 2014-07-04 MED ORDER — NITROGLYCERIN 2 % TD OINT
1.0000 [in_us] | TOPICAL_OINTMENT | Freq: Once | TRANSDERMAL | Status: AC
  Administered 2014-07-04: 1 [in_us] via TOPICAL
  Filled 2014-07-04: qty 1

## 2014-07-04 MED ORDER — FAMOTIDINE 20 MG PO TABS: 20.0000 mg | ORAL_TABLET | Freq: Every day | ORAL | Status: DC

## 2014-07-04 MED ORDER — LORATADINE 10 MG PO TABS: 10.0000 mg | ORAL_TABLET | Freq: Every day | ORAL | Status: DC

## 2014-07-04 MED ORDER — IBUPROFEN 800 MG PO TABS
800.0000 mg | ORAL_TABLET | Freq: Two times a day (BID) | ORAL | Status: DC
  Administered 2014-07-05 – 2014-07-06 (×3): 800 mg via ORAL
  Filled 2014-07-04 (×4): qty 1

## 2014-07-04 MED ORDER — LISINOPRIL 10 MG PO TABS
10.0000 mg | ORAL_TABLET | Freq: Every day | ORAL | Status: DC
  Administered 2014-07-05 – 2014-07-07 (×3): 10 mg via ORAL
  Filled 2014-07-04 (×3): qty 1

## 2014-07-04 MED ORDER — NITROGLYCERIN 0.4 MG SL SUBL
0.4000 mg | SUBLINGUAL_TABLET | SUBLINGUAL | Status: AC | PRN
  Administered 2014-07-04 – 2014-07-05 (×3): 0.4 mg via SUBLINGUAL
  Filled 2014-07-04 (×2): qty 1

## 2014-07-04 MED ORDER — AMLODIPINE BESYLATE 5 MG PO TABS
5.0000 mg | ORAL_TABLET | Freq: Every day | ORAL | Status: DC
  Administered 2014-07-05 – 2014-07-07 (×3): 5 mg via ORAL
  Filled 2014-07-04 (×3): qty 1

## 2014-07-04 MED ORDER — CLONAZEPAM 0.5 MG PO TABS
0.5000 mg | ORAL_TABLET | Freq: Two times a day (BID) | ORAL | Status: DC | PRN
  Administered 2014-07-05: 0.5 mg via ORAL
  Filled 2014-07-04: qty 1

## 2014-07-04 MED ORDER — HEPARIN (PORCINE) IN NACL 100-0.45 UNIT/ML-% IJ SOLN
1450.0000 [IU]/h | INTRAMUSCULAR | Status: DC
  Administered 2014-07-04: 1000 [IU]/h via INTRAVENOUS
  Filled 2014-07-04: qty 250

## 2014-07-04 MED ORDER — HEPARIN SODIUM (PORCINE) 5000 UNIT/ML IJ SOLN: 5000.0000 [IU] | Freq: Three times a day (TID) | INTRAMUSCULAR | Status: DC

## 2014-07-04 MED ORDER — ASPIRIN EC 81 MG PO TBEC
81.0000 mg | DELAYED_RELEASE_TABLET | Freq: Every day | ORAL | Status: DC
  Administered 2014-07-05 – 2014-07-07 (×3): 81 mg via ORAL
  Filled 2014-07-04 (×3): qty 1

## 2014-07-04 MED ORDER — ASPIRIN EC 81 MG PO TBEC: 81.0000 mg | DELAYED_RELEASE_TABLET | Freq: Every day | ORAL | Status: DC

## 2014-07-04 MED ORDER — AMLODIPINE BESYLATE 5 MG PO TABS: 5.0000 mg | ORAL_TABLET | Freq: Every day | ORAL | Status: DC

## 2014-07-04 MED ORDER — ACETAMINOPHEN 325 MG PO TABS
650.0000 mg | ORAL_TABLET | ORAL | Status: DC | PRN
  Administered 2014-07-05: 650 mg via ORAL
  Filled 2014-07-04: qty 2

## 2014-07-04 MED ORDER — FAMOTIDINE 20 MG PO TABS
20.0000 mg | ORAL_TABLET | Freq: Every day | ORAL | Status: DC
  Administered 2014-07-05 – 2014-07-07 (×3): 20 mg via ORAL
  Filled 2014-07-04 (×3): qty 1

## 2014-07-04 MED ORDER — LORATADINE 10 MG PO TABS
10.0000 mg | ORAL_TABLET | Freq: Every day | ORAL | Status: DC
  Administered 2014-07-05 – 2014-07-07 (×3): 10 mg via ORAL
  Filled 2014-07-04 (×3): qty 1

## 2014-07-04 NOTE — ED Provider Notes (Signed)
CSN: [id number]     Arrival date & time 07/04/14  2009 History   First MD Initiated Contact with Patient 07/04/14 2021     Chief Complaint  Patient presents with  . Chest Pain   Patient is a 44 y.o. male presenting with chest pain. The history is provided by the patient.  Chest Pain Pain location:  Substernal area Pain quality: aching, pressure and tightness   Pain radiates to:  Does not radiate Pain severity:  Severe Onset quality:  Sudden Duration:  20 minutes (off an on over the last few days, progressively getting worse, much worse tonight) Timing:  Intermittent Progression:  Worsening Chronicity:  Recurrent Relieved by:  Nitroglycerin Worsened by:  Nothing tried Ineffective treatments:  None tried Associated symptoms: diaphoresis and shortness of breath   Associated symptoms: no fever, not vomiting and no weakness   Risk factors: coronary artery disease    patient has known history of coronary artery disease. He has had a cardiac catheterization showed disease but he did not require any cardiac stents.  Past Medical History  Diagnosis Date  . Arthritis   . Headache(784.0)   . RLS (restless legs syndrome) 10/29/2013  . Primary snoring 10/29/2013  . Obesity 10/29/2013  . Shortness of breath 10/29/2013  . Hypertension    Past Surgical History  Procedure Laterality Date  . Gallbladder surgery    . Throat surgery      nodules removed from throat as child  . Cholecystectomy      10 yrs ago-jenkins  . Mass excision  09/09/2010    Procedure: EXCISION MASS;  Surgeon: Arther Abbott, MD;  Location: [facility];  Service: Orthopedics;  Laterality: Left;  Excision Mass Left Long Finger  . Colonoscopy N/A 03/23/2013    Procedure: COLONOSCOPY;  Surgeon: Daneil Dolin, MD;  Location: [facility];  Service: Endoscopy;  Laterality: N/A;  2:15-moved to 1030 Staff notified pt  . Left heart catheterization with coronary angiogram N/A 11/23/2013    Procedure: LEFT HEART CATHETERIZATION WITH  CORONARY ANGIOGRAM;  Surgeon: Jettie Booze, MD;  [facility] CATH LAB;  Service: Cardiovascular;  Laterality: N/A;  . Fractional flow reserve wire  11/23/2013    Procedure: FRACTIONAL FLOW RESERVE WIRE;  Surgeon: Jettie Booze, MD;  Location: [facility] CATH LAB;  Service: Cardiovascular;;   Family History  Problem Relation Age of Onset  . Arthritis Paternal Uncle   . Cancer Maternal Grandfather   . Anesthesia problems Neg Hx   . Hypotension Neg Hx   . Malignant hyperthermia Neg Hx   . Pseudochol deficiency Neg Hx   . Heart attack Neg Hx   . Stroke Neg Hx   . Hypertension Mother   . Hypertension Maternal Grandmother   . Diabetes    . Diabetes Mother   . Diabetes Maternal Grandmother   . Diabetes    . Diabetes Paternal Grandfather    History  Substance Use Topics  . Smoking status: Former Smoker -- 0.25 packs/day    Types: Cigarettes    Quit date: 09/04/2003  . Smokeless tobacco: Current User    Types: Chew  . Alcohol Use: No     Comment: rare    Review of Systems  Constitutional: Positive for diaphoresis. Negative for fever.  Respiratory: Positive for shortness of breath.   Cardiovascular: Positive for chest pain.  Gastrointestinal: Negative for vomiting.  Neurological: Negative for weakness.  All other systems reviewed and are negative.     Allergies  Prednisone  Home Medications   Prior to Admission medications   Medication Sig Start Date End Date Taking? Authorizing Provider  amLODipine (NORVASC) 5 MG tablet Take 1 tablet (5 mg total) by mouth daily. 04/11/14  Yes Jettie Booze, MD  clonazePAM (KLONOPIN) 0.5 MG tablet Take 0.5 mg by mouth 2 (two) times daily as needed for anxiety.   Yes Historical Provider, MD  ibuprofen (ADVIL,MOTRIN) 800 MG tablet Take 800 mg by mouth 2 (two) times daily.    Yes Historical Provider, MD  lisinopril (PRINIVIL) 10 MG tablet Take 1 tablet (10 mg total) by mouth daily. 11/25/13  Yes Jettie Booze, MD  loratadine  (CLARITIN) 10 MG tablet Take 10 mg by mouth daily.    Yes Historical Provider, MD  nitroGLYCERIN (NITROSTAT) 0.4 MG SL tablet Place 1 tablet (0.4 mg total) under the tongue every 5 (five) minutes as needed for chest pain. 12/04/13  Yes Burtis Junes, NP  ranitidine (ZANTAC) 150 MG capsule Take 150 mg by mouth 2 (two) times daily.   Yes Historical Provider, MD  aspirin EC 81 MG tablet Take 1 tablet (81 mg total) by mouth daily. 12/04/13   Burtis Junes, NP   BP 117/84 mmHg  Pulse 95  Temp(Src) 98.7 F (37.1 C) (Oral)  Resp 16  Ht 6\' 1"  (1.854 m)  Wt 260 lb (117.935 kg)  BMI 34.31 kg/m2  SpO2 95% Physical Exam  Constitutional: He appears well-developed and well-nourished. No distress.  HENT:  Head: Normocephalic and atraumatic.  Right Ear: External ear normal.  Left Ear: External ear normal.  Eyes: Conjunctivae are normal. Right eye exhibits no discharge. Left eye exhibits no discharge. No scleral icterus.  Neck: Neck supple. No tracheal deviation present.  Cardiovascular: Normal rate, regular rhythm and intact distal pulses.   Pulmonary/Chest: Effort normal and breath sounds normal. No stridor. No respiratory distress. He has no wheezes. He has no rales.  Abdominal: Soft. Bowel sounds are normal. He exhibits no distension. There is no tenderness. There is no rebound and no guarding.  Musculoskeletal: He exhibits no edema or tenderness.  Neurological: He is alert. He has normal strength. No cranial nerve deficit (no facial droop, extraocular movements intact, no slurred speech) or sensory deficit. He exhibits normal muscle tone. He displays no seizure activity. Coordination normal.  Skin: Skin is warm and dry. No rash noted.  Psychiatric: He has a normal mood and affect.  Nursing note and vitals reviewed.   ED Course  Procedures (including critical care time) Labs Review Labs Reviewed  APTT  CBC  PROTIME-INR  COMPREHENSIVE METABOLIC PANEL  I-STAT TROPOININ, ED    Imaging  Review Dg Chest Portable 1 View  07/04/2014   CLINICAL DATA:  Substernal chest pain for 2 days, with worsening today. Some relief with nitroglycerin.  EXAM: PORTABLE CHEST - 1 VIEW  COMPARISON:  11/14/2013  FINDINGS: Low lung volumes and large patient habitus noted. Both lungs are grossly clear. Heart size is within normal limits.  IMPRESSION: Low lung volumes.  No acute findings.   Electronically Signed   By: Earle Gell M.D.   On: 07/04/2014 21:03     EKG Interpretation   Date/Time:  Thursday July 04 2014 20:14:56 EDT Ventricular Rate:  91 PR Interval:  128 QRS Duration: 94 QT Interval:  362 QTC Calculation: 445 R Axis:   44 Text Interpretation:  Sinus rhythm Low voltage, precordial leads Baseline  wander in lead(s) V4 No significant change since last tracing Confirmed by  Takuma Cifelli  MD-J, Almadelia Looman ([id number]) on 07/04/2014 8:23:54 PM     Medications  0.9 %  sodium chloride infusion (1,000 mLs Intravenous New Bag/Given 07/04/14 2037)  nitroGLYCERIN (NITROSTAT) SL tablet 0.4 mg (0.4 mg Sublingual Given 07/04/14 2031)  aspirin chewable tablet 324 mg (324 mg Oral Given 07/04/14 2030)  nitroGLYCERIN (NITROGLYN) 2 % ointment 1 inch (1 inch Topical Given 07/04/14 2042)  morphine 4 MG/ML injection 4 mg (4 mg Intravenous Given 07/04/14 2037)  morphine 4 MG/ML injection 4 mg (4 mg Intravenous Given 07/04/14 2109)     MDM   Final diagnoses:  Chest pain, unspecified chest pain type  Coronary artery disease involving native coronary artery of native heart with angina pectoris    The patient continues to have pain despite initial pain medication treatment.  We will give an additional dose of morphine. The patient has had nitroglycerin..  The patient will require admission for serial cardiac enzymes.  I will discuss with medical service here to see whether the patient should be admitted to [facility] vs [facility].    Dorie Rank, MD 07/04/14 2114

## 2014-07-04 NOTE — ED Notes (Signed)
Report given to [facility] on 300

## 2014-07-04 NOTE — Progress Notes (Signed)
ANTICOAGULATION CONSULT NOTE - Initial Consult  Pharmacy Consult for Heparin Indication: chest pain/ACS  Allergies  Allergen Reactions  . Prednisone Other (See Comments)    Makes angry   Patient Measurements: Height: 6\' 1"  (185.4 cm) Weight: 260 lb (117.935 kg) IBW/kg (Calculated) : 79.9  HEPARIN DW (KG): 105.3  Vital Signs: Temp: 98.7 F (37.1 C) (06/09 2017) Temp Source: Oral (06/09 2017) BP: 117/84 mmHg (06/09 2038) Pulse Rate: 95 (06/09 2038)  Labs:  Recent Labs  07/04/14 2038  HGB 14.9  HCT 42.3  PLT 170  APTT 31  LABPROT 14.3  INR 1.09  CREATININE 0.95   Estimated Creatinine Clearance: 136.3 mL/min (by C-G formula based on Cr of 0.95).  Medical History: Past Medical History  Diagnosis Date  . Arthritis   . Headache(784.0)   . RLS (restless legs syndrome) 10/29/2013  . Primary snoring 10/29/2013  . Obesity 10/29/2013  . Shortness of breath 10/29/2013  . Hypertension     Medications:  Reviewed  Assessment: 44yo male c/o chest pressure associated with SOB.  Radiates to arms and shoulders.  Asked to initiate Heparin for ACS.  Pt is morbidly obese.   Goal of Therapy:  Heparin level 0.3-0.7 units/ml Monitor platelets by anticoagulation protocol: Yes   Plan:  Heparin 4000 units bolus (given) Increase Heparin infusion to 1450 units/hr Heparin level in 6-8 hrs then daily CBC daily while on Heparin  [place] Crane, Milee Qualls A 07/04/2014,9:38 PM

## 2014-07-04 NOTE — ED Notes (Signed)
Dr Carloyn Manner notified, additional orders given,

## 2014-07-04 NOTE — ED Notes (Signed)
bp 130/97, hr 95 pain level 8 on pain scale prior to nitro sl being given

## 2014-07-04 NOTE — ED Notes (Signed)
Pt reports chest pains that started yesterday. Has become worse tonight. Pt took 1 nitro at home w/ some relief.

## 2014-07-04 NOTE — ED Notes (Signed)
Dr Knapp at bedside,  

## 2014-07-04 NOTE — H&P (Signed)
[facility] History and Physical  Richard Simpson EAV:[id number] DOB: 04-18-1970 DOA: 07/04/2014  Referring physician: ER, Dr. Tomi Bamberger PCP: Purvis Kilts, MD   Chief Complaint: Chest pain  HPI: Richard Simpson is a 44 y.o. male  This is a 44 year old man who has a history of nonobstructive coronary artery disease who presents with a 24-hour history of intermittent chest pain. It feels a pressure on his chest, does not radiate, appears to be severe and lasts 15-20 minutes each time. He does not seem to be associated with nausea, vomiting or sweating. He also describes dyspnea which she has had for almost a year intermittently. No cause has been found so far. However he did have a CT scan of his chest approximately 10 days ago which showed an abnormality in his left upper lobe. He is due to see the pulmonologist, Dr. Annamaria Boots, tomorrow at 10:45 AM in [place]. He is now being admitted for further investigation.   Review of Systems:  Apart from symptoms above, all systems negative.  Past Medical History  Diagnosis Date  . Arthritis   . Headache(784.0)   . RLS (restless legs syndrome) 10/29/2013  . Primary snoring 10/29/2013  . Obesity 10/29/2013  . Shortness of breath 10/29/2013  . Hypertension    Past Surgical History  Procedure Laterality Date  . Gallbladder surgery    . Throat surgery      nodules removed from throat as child  . Cholecystectomy      10 yrs ago-jenkins  . Mass excision  09/09/2010    Procedure: EXCISION MASS;  Surgeon: Arther Abbott, MD;  Location: [facility] ORS;  Service: Orthopedics;  Laterality: Left;  Excision Mass Left Long Finger  . Colonoscopy N/A 03/23/2013    Procedure: COLONOSCOPY;  Surgeon: Daneil Dolin, MD;  Location: [facility] ENDO SUITE;  Service: Endoscopy;  Laterality: N/A;  2:15-moved to 1030 Staff notified pt  . Left heart catheterization with coronary angiogram N/A 11/23/2013    Procedure: LEFT HEART CATHETERIZATION WITH CORONARY ANGIOGRAM;  Surgeon:  Jettie Booze, MD;  Location: [facility] CATH LAB;  Service: Cardiovascular;  Laterality: N/A;  . Fractional flow reserve wire  11/23/2013    Procedure: FRACTIONAL FLOW RESERVE WIRE;  Surgeon: Jettie Booze, MD;  Location: [facility] CATH LAB;  Service: Cardiovascular;;   Social History:  reports that he quit smoking about 10 years ago. His smoking use included Cigarettes. He smoked 0.25 packs per day. His smokeless tobacco use includes Chew. He reports that he does not drink alcohol or use illicit drugs.  Allergies  Allergen Reactions  . Prednisone Other (See Comments)    Makes angry    Family History  Problem Relation Age of Onset  . Arthritis Paternal Uncle   . Cancer Maternal Grandfather   . Anesthesia problems Neg Hx   . Hypotension Neg Hx   . Malignant hyperthermia Neg Hx   . Pseudochol deficiency Neg Hx   . Heart attack Neg Hx   . Stroke Neg Hx   . Hypertension Mother   . Hypertension Maternal Grandmother   . Diabetes    . Diabetes Mother   . Diabetes Maternal Grandmother   . Diabetes    . Diabetes Paternal Grandfather     Prior to Admission medications   Medication Sig Start Date End Date Taking? Authorizing Provider  amLODipine (NORVASC) 5 MG tablet Take 1 tablet (5 mg total) by mouth daily. 04/11/14  Yes Jettie Booze, MD  clonazePAM (KLONOPIN) 0.5 MG tablet  Take 0.5 mg by mouth 2 (two) times daily as needed for anxiety.   Yes Historical Provider, MD  ibuprofen (ADVIL,MOTRIN) 800 MG tablet Take 800 mg by mouth 2 (two) times daily.    Yes Historical Provider, MD  lisinopril (PRINIVIL) 10 MG tablet Take 1 tablet (10 mg total) by mouth daily. 11/25/13  Yes Jettie Booze, MD  loratadine (CLARITIN) 10 MG tablet Take 10 mg by mouth daily.    Yes Historical Provider, MD  nitroGLYCERIN (NITROSTAT) 0.4 MG SL tablet Place 1 tablet (0.4 mg total) under the tongue every 5 (five) minutes as needed for chest pain. 12/04/13  Yes Burtis Junes, NP  ranitidine (ZANTAC) 150 MG  capsule Take 150 mg by mouth 2 (two) times daily.   Yes Historical Provider, MD  aspirin EC 81 MG tablet Take 1 tablet (81 mg total) by mouth daily. 12/04/13   Burtis Junes, NP   Physical Exam: Filed Vitals:   07/04/14 2030 07/04/14 2038 07/04/14 2100 07/04/14 2130  BP: 130/97 117/84 128/92 140/103  Pulse: 85 95 72 82  Temp:      TempSrc:      Resp: 23 16 19 13   Height:      Weight:      SpO2: 96% 95% 99% 100%    Wt Readings from Last 3 Encounters:  07/04/14 117.935 kg (260 lb)  12/18/13 118.752 kg (261 lb 12.8 oz)  12/04/13 119.804 kg (264 lb 1.9 oz)    General:  Appears calm and comfortable. Somewhat anxious. No acute respiratory distress. No clubbing. Eyes: PERRL, normal lids, irises & conjunctiva ENT: grossly normal hearing, lips & tongue Neck: no LAD, masses or thyromegaly Cardiovascular: RRR, no m/r/g. No LE edema. Telemetry: SR, no arrhythmias  Respiratory: CTA bilaterally, no w/r/r. Normal respiratory effort. Abdomen: soft, ntnd Skin: no rash or induration seen on limited exam Musculoskeletal: grossly normal tone BUE/BLE Psychiatric: grossly normal mood and affect, speech fluent and appropriate Neurologic: grossly non-focal.          Labs on Admission:  Basic Metabolic Panel:  Recent Labs Lab 07/04/14 2038  NA 139  K 3.5  CL 107  CO2 23  GLUCOSE 123*  BUN 18  CREATININE 0.95  CALCIUM 9.1   Liver Function Tests:  Recent Labs Lab 07/04/14 2038  AST 35  ALT 51  ALKPHOS 55  BILITOT 1.0  PROT 6.8  ALBUMIN 4.2   No results for input(s): LIPASE, AMYLASE in the last 168 hours. No results for input(s): AMMONIA in the last 168 hours. CBC:  Recent Labs Lab 07/04/14 2038  WBC 4.6  HGB 14.9  HCT 42.3  MCV 91.6  PLT 170   Cardiac Enzymes: No results for input(s): CKTOTAL, CKMB, CKMBINDEX, TROPONINI in the last 168 hours.  BNP (last 3 results) No results for input(s): BNP in the last 8760 hours.  ProBNP (last 3 results)  Recent Labs   12/18/13 1318  PROBNP 10.0    CBG: No results for input(s): GLUCAP in the last 168 hours.  Radiological Exams on Admission: Dg Chest Portable 1 View  07/04/2014   CLINICAL DATA:  Substernal chest pain for 2 days, with worsening today. Some relief with nitroglycerin.  EXAM: PORTABLE CHEST - 1 VIEW  COMPARISON:  11/14/2013  FINDINGS: Low lung volumes and large patient habitus noted. Both lungs are grossly clear. Heart size is within normal limits.  IMPRESSION: Low lung volumes.  No acute findings.   Electronically Signed   By: Jenny Reichmann  Kris Hartmann M.D.   On: 07/04/2014 21:03    EKG: Independently reviewed. Normal sinus rhythm without any acute ST-T wave changes.  Assessment/Plan   1. Chest pain. The etiology is unclear. He does have a history of non-obstructive coronary artery disease. We will cycle cardiac enzymes. I will ask cardiology consultation in the morning. 2. Dyspnea. There is an abnormality in the left upper lobe on the CT scan that was done approximately 10 days ago. He has an appointment with Dr. Annamaria Boots tomorrow in [place] and he really wants to keep this appointment.  Further recommendations will depend on patient's hospital progress.   Code Status: Full code.   DVT Prophylaxis: Heparin.  Family Communication: I discussed the plan with the patient at the bedside.   Disposition Plan: Home when medically stable.   Time spent: 45 minutes.  Doree Albee [facility] Pager [phone].

## 2014-07-04 NOTE — ED Notes (Signed)
Dr Gosrani at bedside. 

## 2014-07-04 NOTE — ED Notes (Signed)
Pt c/o intermittent chest pressure that started a few days ago, pain is described as pressure, associated with sob, radiates to bilateral arms, shoulders,

## 2014-07-05 ENCOUNTER — Encounter (HOSPITAL_COMMUNITY)
Admission: EM | Disposition: A | Discharge status: Home / Self Care | Payer: PRIVATE HEALTH INSURANCE | Source: Home / Self Care | Attending: Cardiology

## 2014-07-05 ENCOUNTER — Institutional Professional Consult (permissible substitution): Payer: PRIVATE HEALTH INSURANCE | Admitting: Internal Medicine

## 2014-07-05 DIAGNOSIS — M069 Rheumatoid arthritis, unspecified: Secondary | ICD-10-CM | POA: Diagnosis present

## 2014-07-05 DIAGNOSIS — Z6834 Body mass index (BMI) 34.0-34.9, adult: Secondary | ICD-10-CM | POA: Diagnosis not present

## 2014-07-05 DIAGNOSIS — I249 Acute ischemic heart disease, unspecified: Secondary | ICD-10-CM | POA: Diagnosis not present

## 2014-07-05 DIAGNOSIS — I25119 Atherosclerotic heart disease of native coronary artery with unspecified angina pectoris: Secondary | ICD-10-CM | POA: Insufficient documentation

## 2014-07-05 DIAGNOSIS — I10 Essential (primary) hypertension: Secondary | ICD-10-CM | POA: Diagnosis present

## 2014-07-05 DIAGNOSIS — E669 Obesity, unspecified: Secondary | ICD-10-CM | POA: Diagnosis present

## 2014-07-05 DIAGNOSIS — G2581 Restless legs syndrome: Secondary | ICD-10-CM | POA: Diagnosis present

## 2014-07-05 DIAGNOSIS — I255 Ischemic cardiomyopathy: Secondary | ICD-10-CM | POA: Diagnosis present

## 2014-07-05 DIAGNOSIS — Z791 Long term (current) use of non-steroidal anti-inflammatories (NSAID): Secondary | ICD-10-CM | POA: Diagnosis not present

## 2014-07-05 DIAGNOSIS — Z8249 Family history of ischemic heart disease and other diseases of the circulatory system: Secondary | ICD-10-CM | POA: Diagnosis not present

## 2014-07-05 DIAGNOSIS — E785 Hyperlipidemia, unspecified: Secondary | ICD-10-CM | POA: Diagnosis present

## 2014-07-05 DIAGNOSIS — I214 Non-ST elevation (NSTEMI) myocardial infarction: Secondary | ICD-10-CM | POA: Diagnosis present

## 2014-07-05 DIAGNOSIS — Z7982 Long term (current) use of aspirin: Secondary | ICD-10-CM | POA: Diagnosis not present

## 2014-07-05 DIAGNOSIS — K219 Gastro-esophageal reflux disease without esophagitis: Secondary | ICD-10-CM | POA: Diagnosis present

## 2014-07-05 DIAGNOSIS — Z79899 Other long term (current) drug therapy: Secondary | ICD-10-CM | POA: Diagnosis not present

## 2014-07-05 DIAGNOSIS — F17220 Nicotine dependence, chewing tobacco, uncomplicated: Secondary | ICD-10-CM | POA: Diagnosis present

## 2014-07-05 DIAGNOSIS — Z888 Allergy status to other drugs, medicaments and biological substances status: Secondary | ICD-10-CM | POA: Diagnosis not present

## 2014-07-05 DIAGNOSIS — R079 Chest pain, unspecified: Secondary | ICD-10-CM | POA: Diagnosis present

## 2014-07-05 HISTORY — PX: CARDIAC CATHETERIZATION: SHX172

## 2014-07-05 LAB — TROPONIN I
TROPONIN I: 0.16 ng/mL — AB (ref <0.031)
Troponin I: 0.17 ng/mL — ABNORMAL HIGH (ref <0.031)
Troponin I: 18.22 ng/mL (ref <0.031)

## 2014-07-05 LAB — CBC
HEMATOCRIT: 43.4 % (ref 39.0–52.0)
HEMOGLOBIN: 14.8 g/dL (ref 13.0–17.0)
MCH: 31.9 pg (ref 26.0–34.0)
MCHC: 34.1 g/dL (ref 30.0–36.0)
MCV: 93.5 fL (ref 78.0–100.0)
Platelets: 174 10*3/uL (ref 150–400)
RBC: 4.64 MIL/uL (ref 4.22–5.81)
RDW: 12.7 % (ref 11.5–15.5)
WBC: 5 10*3/uL (ref 4.0–10.5)

## 2014-07-05 LAB — MRSA PCR SCREENING
MRSA BY PCR: NEGATIVE
MRSA by PCR: NEGATIVE

## 2014-07-05 LAB — POCT ACTIVATED CLOTTING TIME: Activated Clotting Time: 460 seconds

## 2014-07-05 SURGERY — LEFT HEART CATH AND CORONARY ANGIOGRAPHY

## 2014-07-05 MED ORDER — MORPHINE SULFATE 2 MG/ML IJ SOLN
2.0000 mg | INTRAMUSCULAR | Status: AC
  Administered 2014-07-05: 2 mg via INTRAVENOUS

## 2014-07-05 MED ORDER — TICAGRELOR 90 MG PO TABS
90.0000 mg | ORAL_TABLET | Freq: Two times a day (BID) | ORAL | Status: DC
  Administered 2014-07-05 – 2014-07-07 (×4): 90 mg via ORAL
  Filled 2014-07-05 (×5): qty 1

## 2014-07-05 MED ORDER — NITROGLYCERIN IN D5W 200-5 MCG/ML-% IV SOLN
0.0000 ug/min | INTRAVENOUS | Status: DC
  Administered 2014-07-05: 5 ug/min via INTRAVENOUS
  Administered 2014-07-05: 70 ug/min via INTRAVENOUS
  Filled 2014-07-05: qty 250

## 2014-07-05 MED ORDER — ATORVASTATIN CALCIUM 80 MG PO TABS
80.0000 mg | ORAL_TABLET | Freq: Every day | ORAL | Status: DC
  Administered 2014-07-06: 80 mg via ORAL
  Filled 2014-07-05 (×2): qty 1

## 2014-07-05 MED ORDER — METOPROLOL TARTRATE 25 MG PO TABS
25.0000 mg | ORAL_TABLET | Freq: Two times a day (BID) | ORAL | Status: DC
  Administered 2014-07-05 – 2014-07-07 (×5): 25 mg via ORAL
  Filled 2014-07-05 (×6): qty 1

## 2014-07-05 MED ORDER — METOPROLOL TARTRATE 12.5 MG HALF TABLET
12.5000 mg | ORAL_TABLET | Freq: Two times a day (BID) | ORAL | Status: DC
  Administered 2014-07-05: 12.5 mg via ORAL
  Filled 2014-07-05 (×2): qty 1

## 2014-07-05 MED ORDER — HYDROMORPHONE HCL 1 MG/ML IJ SOLN
0.5000 mg | Freq: Once | INTRAMUSCULAR | Status: AC
Start: 2014-07-05 — End: 2014-07-05
  Administered 2014-07-05: 0.5 mg via INTRAVENOUS
  Filled 2014-07-05: qty 1

## 2014-07-05 MED ORDER — MIDAZOLAM HCL 2 MG/2ML IJ SOLN
INTRAMUSCULAR | Status: DC | PRN
Start: 2014-07-05 — End: 2014-07-05
  Administered 2014-07-05: 2 mg via INTRAVENOUS
  Administered 2014-07-05 (×2): 1 mg via INTRAVENOUS

## 2014-07-05 MED ORDER — TICAGRELOR 90 MG PO TABS
ORAL_TABLET | ORAL | Status: DC | PRN
  Administered 2014-07-05: 180 mg via ORAL

## 2014-07-05 MED ORDER — NITROGLYCERIN IN D5W 200-5 MCG/ML-% IV SOLN
0.0000 ug/min | INTRAVENOUS | Status: DC
  Administered 2014-07-05: 15 ug/min via INTRAVENOUS
  Filled 2014-07-05: qty 250

## 2014-07-05 MED ORDER — SODIUM CHLORIDE 0.9 % IV SOLN
1.7500 mg/kg/h | INTRAVENOUS | Status: DC
  Filled 2014-07-05 (×2): qty 250

## 2014-07-05 MED ORDER — MORPHINE SULFATE 2 MG/ML IJ SOLN
INTRAMUSCULAR | Status: AC
  Filled 2014-07-05: qty 1

## 2014-07-05 MED ORDER — SODIUM CHLORIDE 0.9 % IV SOLN
1.7500 mg/kg/h | INTRAVENOUS | Status: AC
  Administered 2014-07-05: 1.75 mg/kg/h via INTRAVENOUS
  Filled 2014-07-05: qty 250

## 2014-07-05 MED ORDER — FENTANYL CITRATE (PF) 100 MCG/2ML IJ SOLN
INTRAMUSCULAR | Status: DC | PRN
  Administered 2014-07-05 (×3): 50 ug via INTRAVENOUS

## 2014-07-05 MED ORDER — MORPHINE SULFATE 2 MG/ML IJ SOLN
1.0000 mg | INTRAMUSCULAR | Status: DC | PRN
  Administered 2014-07-05 (×2): 2 mg via INTRAVENOUS
  Filled 2014-07-05 (×2): qty 1

## 2014-07-05 MED ORDER — BIVALIRUDIN BOLUS VIA INFUSION
0.1000 mg/kg | Freq: Once | INTRAVENOUS | Status: DC
  Filled 2014-07-05: qty 12

## 2014-07-05 MED ORDER — HEPARIN (PORCINE) IN NACL 100-0.45 UNIT/ML-% IJ SOLN
1450.0000 [IU]/h | INTRAMUSCULAR | Status: DC
  Administered 2014-07-05: 1450 [IU]/h via INTRAVENOUS
  Filled 2014-07-05: qty 250

## 2014-07-05 MED ORDER — SODIUM CHLORIDE 0.9 % IV SOLN
250.0000 mg | INTRAVENOUS | Status: DC | PRN
  Administered 2014-07-05: 1.75 mg/kg/h via INTRAVENOUS

## 2014-07-05 MED ORDER — HEPARIN SODIUM (PORCINE) 1000 UNIT/ML IJ SOLN
INTRAMUSCULAR | Status: DC | PRN
Start: 2014-07-05 — End: 2014-07-05
  Administered 2014-07-05: 4000 [IU] via INTRAVENOUS

## 2014-07-05 MED ORDER — SODIUM CHLORIDE 0.9 % IJ SOLN: 3.0000 mL | INTRAMUSCULAR | Status: DC | PRN

## 2014-07-05 MED ORDER — SODIUM CHLORIDE 0.9 % IJ SOLN
3.0000 mL | Freq: Two times a day (BID) | INTRAMUSCULAR | Status: DC
  Administered 2014-07-06: 09:00:00 via INTRAVENOUS
  Administered 2014-07-06: 3 mL via INTRAVENOUS
  Administered 2014-07-06: 09:00:00 via INTRAVENOUS

## 2014-07-05 MED ORDER — ONDANSETRON HCL 4 MG/2ML IJ SOLN: 4.0000 mg | Freq: Four times a day (QID) | INTRAMUSCULAR | Status: DC | PRN

## 2014-07-05 MED ORDER — SODIUM CHLORIDE 0.9 % IV SOLN: 250.0000 mL | INTRAVENOUS | Status: DC | PRN

## 2014-07-05 MED ORDER — BIVALIRUDIN BOLUS VIA INFUSION - CUPID
INTRAVENOUS | Status: DC | PRN
  Administered 2014-07-05: 87.75 mg via INTRAVENOUS

## 2014-07-05 MED ORDER — ATORVASTATIN CALCIUM 80 MG PO TABS: 80.0000 mg | ORAL_TABLET | Freq: Every day | ORAL | Status: DC

## 2014-07-05 MED ORDER — SODIUM CHLORIDE 0.9 % IV SOLN
INTRAVENOUS | Status: AC
  Administered 2014-07-05: 15:00:00 via INTRAVENOUS

## 2014-07-05 MED ORDER — SODIUM CHLORIDE 0.9 % IV SOLN
1.7500 mg/kg/h | INTRAVENOUS | Status: DC
  Administered 2014-07-05: 1.75 mg/kg/h via INTRAVENOUS
  Filled 2014-07-05: qty 250

## 2014-07-05 MED ORDER — IOHEXOL 350 MG/ML SOLN
INTRAVENOUS | Status: DC | PRN
  Administered 2014-07-05: 300 mL via INTRAVENOUS

## 2014-07-05 MED ORDER — ASPIRIN EC 81 MG PO TBEC: 81.0000 mg | DELAYED_RELEASE_TABLET | Freq: Every day | ORAL | Status: DC

## 2014-07-05 MED ORDER — HYDROMORPHONE HCL 1 MG/ML IJ SOLN
0.5000 mg | Freq: Once | INTRAMUSCULAR | Status: AC
  Administered 2014-07-05: 0.5 mg via INTRAVENOUS
  Filled 2014-07-05: qty 1

## 2014-07-05 MED ORDER — ACETAMINOPHEN 325 MG PO TABS: 650.0000 mg | ORAL_TABLET | ORAL | Status: DC | PRN

## 2014-07-05 MED ORDER — HEPARIN BOLUS VIA INFUSION
4000.0000 [IU] | Freq: Once | INTRAVENOUS | Status: AC
  Administered 2014-07-05: 4000 [IU] via INTRAVENOUS
  Filled 2014-07-05: qty 4000

## 2014-07-05 SURGICAL SUPPLY — 27 items
BALLN EMERGE MR 2.0X12 (BALLOONS) ×4
BALLN EMERGE MR 3.0X15 (BALLOONS) ×4
BALLN ~~LOC~~ TREK RX 3.75X15 (BALLOONS) ×2 IMPLANT
BALLOON EMERGE MR 2.0X12 (BALLOONS) IMPLANT
BALLOON EMERGE MR 3.0X15 (BALLOONS) IMPLANT
CATH INFINITI 5FR ANG PIGTAIL (CATHETERS) ×4 IMPLANT
CATH INFINITI 5FR JL4 (CATHETERS) ×2 IMPLANT
CATH LAUNCHER 5F RADL (CATHETERS) IMPLANT
CATH OPTITORQUE TIG 4.0 5F (CATHETERS) ×4 IMPLANT
CATHETER LAUNCHER 5F RADL (CATHETERS) ×4
DEVICE RAD COMP TR BAND LRG (VASCULAR PRODUCTS) ×4 IMPLANT
GLIDESHEATH SLEND A-KIT 6F 22G (SHEATH) ×4 IMPLANT
GUIDE CATH RUNWAY 6FR CLS3 (CATHETERS) ×2 IMPLANT
GUIDE CATH RUNWAY 6FR VL4 (CATHETERS) ×2 IMPLANT
KIT ENCORE 26 ADVANTAGE (KITS) ×2 IMPLANT
KIT HEART LEFT (KITS) ×4 IMPLANT
NDL PERC ENTRY 21G 2.5CM (NEEDLE) IMPLANT
NEEDLE PERC ENTRY 21G 2.5CM (NEEDLE) ×4 IMPLANT
PACK CARDIAC CATHETERIZATION (CUSTOM PROCEDURE TRAY) ×4 IMPLANT
STENT SYNERGY DES 3.5X20 (Permanent Stent) ×2 IMPLANT
SYR MEDRAD MARK V 150ML (SYRINGE) ×4 IMPLANT
TRANSDUCER W/STOPCOCK (MISCELLANEOUS) ×4 IMPLANT
TUBING CIL FLEX 10 FLL-RA (TUBING) ×4 IMPLANT
WIRE ASAHI FIELDER FC 180CM (WIRE) ×2 IMPLANT
WIRE LUGE 182CM (WIRE) ×2 IMPLANT
WIRE PT2 MS 185 (WIRE) ×2 IMPLANT
WIRE SAFE-T 1.5MM-J .035X260CM (WIRE) ×4 IMPLANT

## 2014-07-05 NOTE — Progress Notes (Signed)
ANTICOAGULATION CONSULT NOTE - Preliminary  Pharmacy Consult for heparin Indication: chest pain/ACS  Allergies  Allergen Reactions  . Prednisone Other (See Comments)    Makes angry    Patient Measurements: Height: 6\' 1"  (185.4 cm) Weight: 258 lb (117.028 kg) IBW/kg (Calculated) : 79.9 HEPARIN DW (KG): 105   Vital Signs: Temp: 98.7 F (37.1 C) (06/10 0214) Temp Source: Oral (06/10 0214) BP: 128/88 mmHg (06/10 0508) Pulse Rate: 58 (06/10 0508)  Labs:  Recent Labs  07/04/14 2038 07/05/14 0236  HGB 14.9  --   HCT 42.3  --   PLT 170  --   APTT 31  --   LABPROT 14.3  --   INR 1.09  --   CREATININE 0.95  --   TROPONINI  --  0.16*   Estimated Creatinine Clearance: 135.7 mL/min (by C-G formula based on Cr of 0.95).  Medical History: Past Medical History  Diagnosis Date  . Arthritis   . Headache(784.0)   . RLS (restless legs syndrome) 10/29/2013  . Primary snoring 10/29/2013  . Obesity 10/29/2013  . Shortness of breath 10/29/2013  . Hypertension     Medications:  Prescriptions prior to admission  Medication Sig Dispense Refill Last Dose  . amLODipine (NORVASC) 5 MG tablet Take 1 tablet (5 mg total) by mouth daily. 30 tablet 3 07/04/2014 at Unknown time  . clonazePAM (KLONOPIN) 0.5 MG tablet Take 0.5 mg by mouth 2 (two) times daily as needed for anxiety.   07/04/2014 at Unknown time  . ibuprofen (ADVIL,MOTRIN) 800 MG tablet Take 800 mg by mouth 2 (two) times daily.    07/04/2014 at Unknown time  . lisinopril (PRINIVIL) 10 MG tablet Take 1 tablet (10 mg total) by mouth daily. 30 tablet 11 07/04/2014 at Unknown time  . loratadine (CLARITIN) 10 MG tablet Take 10 mg by mouth daily.    07/04/2014 at Unknown time  . nitroGLYCERIN (NITROSTAT) 0.4 MG SL tablet Place 1 tablet (0.4 mg total) under the tongue every 5 (five) minutes as needed for chest pain. 25 tablet 3 07/04/2014 at Unknown time  . ranitidine (ZANTAC) 150 MG capsule Take 150 mg by mouth 2 (two) times daily.   07/04/2014 at  Unknown time  . aspirin EC 81 MG tablet Take 1 tablet (81 mg total) by mouth daily. 30 tablet 3 unknown    Assessment: 44 yo male with hx CAD, having chest pain. Initially started on heparin 6/9 at 2125, infusion stopped at 2146. This AM, pt having mild chest pain and mildly elevated troponin. Will restart heparin.  Goal of Therapy:  Heparin level 0.3-0.7 units/ml   Plan:  Give 4000 units bolus x 1 Start heparin infusion at 1450 units/hr] Check heparin level in 6 hours. Preliminary review of pertinent patient information completed.  Forestine Na clinical pharmacist will complete review during morning rounds to assess the patient and finalize treatment regimen.  Aubrii Sharpless Scarlett, RPH 07/05/2014,5:27 AM

## 2014-07-05 NOTE — Progress Notes (Signed)
Pt troponin level was 0.16, MD ordered a stat 12 lead EKG. Awaiting results, will notify MD.

## 2014-07-05 NOTE — Progress Notes (Signed)
Administered 2 mg of Morphine, pt states it is easing his pain some. Pt reports currently after 5 minutes pain is a 7. Will check back in 30 minutes to see progress in pain reduction. Pt currently resting comfortably in bed with wife at bedside. No additional concerns noted.

## 2014-07-05 NOTE — Consult Note (Signed)
Consulting cardiologist: Dr Carlyle Dolly  Clinical Summary Richard Simpson is a 44 y.o.male hx of non-obstructive CAD, HTN admitted with chest pain. Progressing sharp/heavy chest pain in mid chest for last several days. Increased DOE over that period of time as well.   K 3.5 Cr 0.95 Hgb 14.9 Plt 170 trop 0.17 CXR no acute process EKG SR, TWI inferior leads in some EKGs 08/2013 Echo LVEF 60-65%  CT PE 06/25/14 no PE. LUL opacity 10/2013 LHC: moderate LAD disease, no significant by FFR Allergies  Allergen Reactions  . Prednisone Other (See Comments)    Makes angry    Medications Scheduled Medications: . amLODipine  5 mg Oral Daily  . aspirin EC  81 mg Oral Daily  . famotidine  20 mg Oral Daily  . ibuprofen  800 mg Oral BID  . lisinopril  10 mg Oral Daily  . loratadine  10 mg Oral Daily     Infusions: . sodium chloride 1,000 mL (07/04/14 2037)  . heparin 1,450 Units/hr (07/05/14 0700)  . nitroGLYCERIN 70 mcg/min (07/05/14 0700)     PRN Medications:  acetaminophen, clonazePAM, morphine injection, ondansetron (ZOFRAN) IV   Past Medical History  Diagnosis Date  . Arthritis   . Headache(784.0)   . RLS (restless legs syndrome) 10/29/2013  . Primary snoring 10/29/2013  . Obesity 10/29/2013  . Shortness of breath 10/29/2013  . Hypertension     Past Surgical History  Procedure Laterality Date  . Gallbladder surgery    . Throat surgery      nodules removed from throat as child  . Cholecystectomy      10 yrs ago-jenkins  . Mass excision  09/09/2010    Procedure: EXCISION MASS;  Surgeon: Arther Abbott, MD;  Location: [facility] ORS;  Service: Orthopedics;  Laterality: Left;  Excision Mass Left Long Finger  . Colonoscopy N/A 03/23/2013    Procedure: COLONOSCOPY;  Surgeon: Daneil Dolin, MD;  Location: [facility] ENDO SUITE;  Service: Endoscopy;  Laterality: N/A;  2:15-moved to 1030 Staff notified pt  . Left heart catheterization with coronary angiogram N/A 11/23/2013    Procedure: LEFT  HEART CATHETERIZATION WITH CORONARY ANGIOGRAM;  Surgeon: Jettie Booze, MD;  Location: [facility] CATH LAB;  Service: Cardiovascular;  Laterality: N/A;  . Fractional flow reserve wire  11/23/2013    Procedure: FRACTIONAL FLOW RESERVE WIRE;  Surgeon: Jettie Booze, MD;  Location: [facility] CATH LAB;  Service: Cardiovascular;;    Family History  Problem Relation Age of Onset  . Arthritis Paternal Uncle   . Cancer Maternal Grandfather   . Anesthesia problems Neg Hx   . Hypotension Neg Hx   . Malignant hyperthermia Neg Hx   . Pseudochol deficiency Neg Hx   . Heart attack Neg Hx   . Stroke Neg Hx   . Hypertension Mother   . Hypertension Maternal Grandmother   . Diabetes    . Diabetes Mother   . Diabetes Maternal Grandmother   . Diabetes    . Diabetes Paternal Grandfather     Social History Mr. Barbar reports that he quit smoking about 10 years ago. His smoking use included Cigarettes. He smoked 0.25 packs per day. His smokeless tobacco use includes Chew. Mr. Stettler reports that he does not drink alcohol.  Review of Systems CONSTITUTIONAL: No weight loss, fever, chills, weakness or fatigue.  HEENT: Eyes: No visual loss, blurred vision, double vision or yellow sclerae. No hearing loss, sneezing, congestion, runny nose or sore throat.  SKIN: No  rash or itching.  CARDIOVASCULAR: per HPI RESPIRATORY: No shortness of breath, cough or sputum.  GASTROINTESTINAL: No anorexia, nausea, vomiting or diarrhea. No abdominal pain or blood.  GENITOURINARY: no polyuria, no dysuria NEUROLOGICAL: No headache, dizziness, syncope, paralysis, ataxia, numbness or tingling in the extremities. No change in bowel or bladder control.  MUSCULOSKELETAL: No muscle, back pain, joint pain or stiffness.  HEMATOLOGIC: No anemia, bleeding or bruising.  LYMPHATICS: No enlarged nodes. No history of splenectomy.  PSYCHIATRIC: No history of depression or anxiety.      Physical Examination Blood pressure 110/84, pulse  64, temperature 98.7 F (37.1 C), temperature source Oral, resp. rate 16, height 6\' 1"  (1.854 m), weight 258 lb (117.028 kg), SpO2 90 %.  Intake/Output Summary (Last 24 hours) at 07/05/14 0757 Last data filed at 07/04/14 2221  Gross per 24 hour  Intake      0 ml  Output    350 ml  Net   -350 ml    HEENT: sclera clear  Cardiovascular: RRR, no m/r/g, no JVD  Respiratory: CTAB  GI: abdomen soft, N, ND  MSK: no LE edema  Neuro: no focal deficits  Psych: appropriate affect   Lab Results  Basic Metabolic Panel:  Recent Labs Lab 07/04/14 2038  NA 139  K 3.5  CL 107  CO2 23  GLUCOSE 123*  BUN 18  CREATININE 0.95  CALCIUM 9.1    Liver Function Tests:  Recent Labs Lab 07/04/14 2038  AST 35  ALT 51  ALKPHOS 55  BILITOT 1.0  PROT 6.8  ALBUMIN 4.2    CBC:  Recent Labs Lab 07/04/14 2038 07/05/14 0528  WBC 4.6 5.0  HGB 14.9 14.8  HCT 42.3 43.4  MCV 91.6 93.5  PLT 170 174    Cardiac Enzymes:  Recent Labs Lab 07/05/14 0236 07/05/14 0528  TROPONINI 0.16* 0.17*    BNP: Invalid input(s): POCBNP     Impression/Recommendations  1. NSTEMI - previous hx of non-obstructive CAD, cath 10/2013 with moderate LAD lesion not significant by FFR - ongoing chest pain on NG gtt, trop up to 0.17. Intermittent TWIs inferior leads on EKG - on ASA 81, hep gtt, lisinopril 10, NG gtt - will plan for transfer to [facility] today for cath given ongoing chest pain.  - start high dose statin, low dose beta blocker  Carlyle Dolly, M.D.

## 2014-07-05 NOTE — Progress Notes (Signed)
Patient received Dilaudid 0.5mg  IV for chest pain 10 /10. Pt became very drowsy, but pain was reduced to 5/10. Continue to monitor the patient.

## 2014-07-05 NOTE — Progress Notes (Signed)
Notified MD, pt's EKG results. Pt having active chest pain and states it's worse than before. MD ordered another EKG and additional dose of Morphine. Patient resting comfortably.

## 2014-07-05 NOTE — Progress Notes (Signed)
Called report to [place], Therapist, sports.  Verbalized understanding.  Pt to be transferred to cath lab at [facility] when [place] arrives. [place], Eulis Canner 07/05/2014

## 2014-07-05 NOTE — Evaluation (Addendum)
Received notification from nurse about patient with mildly elevated beta troponin 0.16. Noted to have been admitted for chest pain. Patient was initially hesitant to be admitted with anticipated plan for early discharge pulmonology follow-up. Discussed finding with patient via telephone. Patient does report having some mild chest pain at present. No nausea or diaphoresis reported. We'll start patient on heparin and nitroglycerin drips. Complaint transferred to the ICU for further evaluation. Continue serial troponins. EKG at the bedside shows sinus bradycardia with no acute abnormalities. Otherwise hemodynamically stable at present. (attempted to discuss case w/ on call cardiology at [facility]. However, no page returned to date)

## 2014-07-05 NOTE — Care Management Note (Signed)
Case Management Note  Patient Details  Name: Richard Simpson MRN: [id number] Date of Birth: 02-09-1970  Subjective/Objective:      Adm w mi              Action/Plan:lives w wife, pcp dr Eddie Candle  Expected Discharge Date:  07/05/14               Expected Discharge Plan:  Home/Self Care  In-House Referral:     Discharge planning Services  CM Consult, Medication Assistance  Post Acute Care Choice:    Choice offered to:     DME Arranged:    DME Agency:     HH Arranged:    [place] Agency:     Status of Service:     Medicare Important Message Given:    Date Medicare IM Given:    Medicare IM give by:    Date Additional Medicare IM Given:    Additional Medicare Important Message give by:     If discussed at [place] of Stay Meetings, dates discussed:    Additional Comments:ur review done. Gave pt 30day free and copay asssit card for brilinta.  Lacretia Leigh, RN 07/05/2014, 3:01 PM

## 2014-07-05 NOTE — Progress Notes (Signed)
Pt transferred to stepdown unit. Report given to [facility] at bedside. Orders for heparin drip and nitroglycerin drip given to RN as acknowledged. Pt taken off tele and CCMD phoned. Pt and family given emotional support.

## 2014-07-05 NOTE — Progress Notes (Signed)
Patient has active chest pain, 10/10. Nitroglycerin and heparin drips are started. The order received to give additional 2 mg of Morphine.

## 2014-07-05 NOTE — Progress Notes (Signed)
  PROGRESS NOTE  Richard Simpson [id number] DOB: 04-13-1970 DOA: 07/04/2014 PCP: Purvis Kilts, MD  Summary: 44 year old man with history of nonobstructive coronary artery disease presented with chest pain.  Assessment/Plan: 1. NSTEMI. Hemodynamics stable. Pain much improved. Continue lisinopril, metoprolol, statin, heparin and NTG infusions. 2. HTN. Stable.  3. Abnormality in his left upper lobe of the lung. Was due to see the pulmonologist, Dr. Annamaria Boots, 6/10 10:45 AM in [place]. This will need to be rescheduled.   Appears stable.  Discussed with Dr. Geralynn Rile with transfer to [facility] for [place]. Dr. Harl Bowie plans for patient to go to cardiology service at [facility].  [facility] will sign off on transfer. Please call us [phone] if we can be of assistance at [facility]  Code Status: full code DVT prophylaxis: heparin Family Communication:  Disposition Plan: [facility]  Murray Hodgkins, MD  [facility]  Pager [phone] If 7PM-7AM, please contact night-coverage at [email or web address], password [facility] 07/05/2014, 8:14 AM    Consultants:  Cardiology  HPI/Subjective: Feeling better but still has some chest pain.  Objective: Filed Vitals:   07/05/14 0630 07/05/14 0635 07/05/14 0640 07/05/14 0700  BP: 127/92 117/83 117/93 110/84  Pulse: 74 71 69 64  Temp:      TempSrc:      Resp: 16 20 19 16   Height:      Weight:      SpO2: 97% 96% 95% 90%    Intake/Output Summary (Last 24 hours) at 07/05/14 0814 Last data filed at 07/04/14 2221  Gross per 24 hour  Intake      0 ml  Output    350 ml  Net   -350 ml     Filed Weights   07/04/14 2017 07/04/14 2218  Weight: 117.935 kg (260 lb) 117.028 kg (258 lb)    Exam:     Afebrile, vital signs stable General:  Appears calm and comfortable Cardiovascular: RRR, no m/r/g.  Telemetry: SR, no arrhythmias  Respiratory: CTA bilaterally, no w/r/r. Normal respiratory effort. Psychiatric: grossly normal mood and affect, speech fluent and  appropriate  Data reviewed:  Troponins elevated, 0.16 >> 0.17  CBC and basic metabolic panel unremarkable. Hepatic function panel unremarkable.  Chest x-ray 1 view, low lung volumes, no acute changes seen.  Multiple EKGs, 2014, 2123, [place], 0511, 3009 reviewed. Sinus rhythm. No acute changes. Compared to previous study 11/23/2013, no acute changes.  Pertinent data since admission:    Pending data:    Scheduled Meds: . amLODipine  5 mg Oral Daily  . aspirin EC  81 mg Oral Daily  . famotidine  20 mg Oral Daily  . ibuprofen  800 mg Oral BID  . lisinopril  10 mg Oral Daily  . loratadine  10 mg Oral Daily   Continuous Infusions: . sodium chloride 1,000 mL (07/04/14 2037)  . heparin 1,450 Units/hr (07/05/14 0700)  . nitroGLYCERIN 70 mcg/min (07/05/14 0700)    Principal Problem:   ACS (acute coronary syndrome) Active Problems:   Chest pain   Coronary arteriosclerosis   Fatigue   Dyspnea   Time spent 15 minutes

## 2014-07-05 NOTE — H&P (View-Only) (Signed)
Consulting cardiologist: Dr Carlyle Dolly  Clinical Summary Richard Simpson is a 44 y.o.male hx of non-obstructive CAD, HTN admitted with chest pain. Progressing sharp/heavy chest pain in mid chest for last several days. Increased DOE over that period of time as well.   K 3.5 Cr 0.95 Hgb 14.9 Plt 170 trop 0.17 CXR no acute process EKG SR, TWI inferior leads in some EKGs 08/2013 Echo LVEF 60-65%  CT PE 06/25/14 no PE. LUL opacity 10/2013 LHC: moderate LAD disease, no significant by FFR Allergies  Allergen Reactions  . Prednisone Other (See Comments)    Makes angry    Medications Scheduled Medications: . amLODipine  5 mg Oral Daily  . aspirin EC  81 mg Oral Daily  . famotidine  20 mg Oral Daily  . ibuprofen  800 mg Oral BID  . lisinopril  10 mg Oral Daily  . loratadine  10 mg Oral Daily     Infusions: . sodium chloride 1,000 mL (07/04/14 2037)  . heparin 1,450 Units/hr (07/05/14 0700)  . nitroGLYCERIN 70 mcg/min (07/05/14 0700)     PRN Medications:  acetaminophen, clonazePAM, morphine injection, ondansetron (ZOFRAN) IV   Past Medical History  Diagnosis Date  . Arthritis   . Headache(784.0)   . RLS (restless legs syndrome) 10/29/2013  . Primary snoring 10/29/2013  . Obesity 10/29/2013  . Shortness of breath 10/29/2013  . Hypertension     Past Surgical History  Procedure Laterality Date  . Gallbladder surgery    . Throat surgery      nodules removed from throat as child  . Cholecystectomy      10 yrs ago-[facility]  . Mass excision  09/09/2010    Procedure: EXCISION MASS;  Surgeon: Arther Abbott, MD;  Location: [facility] ORS;  Service: Orthopedics;  Laterality: Left;  Excision Mass Left Long Finger  . Colonoscopy N/A 03/23/2013    Procedure: COLONOSCOPY;  Surgeon: Daneil Dolin, MD;  Location: [facility] ENDO SUITE;  Service: Endoscopy;  Laterality: N/A;  2:15-moved to 1030 Staff notified pt  . Left heart catheterization with coronary angiogram N/A 11/23/2013    Procedure: LEFT  HEART CATHETERIZATION WITH CORONARY ANGIOGRAM;  Surgeon: Jettie Booze, MD;  Location: [facility] CATH LAB;  Service: Cardiovascular;  Laterality: N/A;  . Fractional flow reserve wire  11/23/2013    Procedure: FRACTIONAL FLOW RESERVE WIRE;  Surgeon: Jettie Booze, MD;  Location: [facility] CATH LAB;  Service: Cardiovascular;;    Family History  Problem Relation Age of Onset  . Arthritis Paternal Uncle   . Cancer Maternal Grandfather   . Anesthesia problems Neg Hx   . Hypotension Neg Hx   . Malignant hyperthermia Neg Hx   . Pseudochol deficiency Neg Hx   . Heart attack Neg Hx   . Stroke Neg Hx   . Hypertension Mother   . Hypertension Maternal Grandmother   . Diabetes    . Diabetes Mother   . Diabetes Maternal Grandmother   . Diabetes    . Diabetes Paternal Grandfather     Social History Richard Simpson reports that he quit smoking about 10 years ago. His smoking use included Cigarettes. He smoked 0.25 packs per day. His smokeless tobacco use includes Chew. Richard Simpson reports that he does not drink alcohol.  Review of Systems CONSTITUTIONAL: No weight loss, fever, chills, weakness or fatigue.  HEENT: Eyes: No visual loss, blurred vision, double vision or yellow sclerae. No hearing loss, sneezing, congestion, runny nose or sore throat.  SKIN: No  rash or itching.  CARDIOVASCULAR: per HPI RESPIRATORY: No shortness of breath, cough or sputum.  GASTROINTESTINAL: No anorexia, nausea, vomiting or diarrhea. No abdominal pain or blood.  GENITOURINARY: no polyuria, no dysuria NEUROLOGICAL: No headache, dizziness, syncope, paralysis, ataxia, numbness or tingling in the extremities. No change in bowel or bladder control.  MUSCULOSKELETAL: No muscle, back pain, joint pain or stiffness.  HEMATOLOGIC: No anemia, bleeding or bruising.  LYMPHATICS: No enlarged nodes. No history of splenectomy.  PSYCHIATRIC: No history of depression or anxiety.      Physical Examination Blood pressure 110/84, pulse  64, temperature 98.7 F (37.1 C), temperature source Oral, resp. rate 16, height 6\' 1"  (1.854 m), weight 258 lb (117.028 kg), SpO2 90 %.  Intake/Output Summary (Last 24 hours) at 07/05/14 0757 Last data filed at 07/04/14 2221  Gross per 24 hour  Intake      0 ml  Output    350 ml  Net   -350 ml    HEENT: sclera clear  Cardiovascular: RRR, no m/r/g, no JVD  Respiratory: CTAB  GI: abdomen soft, N, ND  MSK: no LE edema  Neuro: no focal deficits  Psych: appropriate affect   Lab Results  Basic Metabolic Panel:  Recent Labs Lab 07/04/14 2038  NA 139  K 3.5  CL 107  CO2 23  GLUCOSE 123*  BUN 18  CREATININE 0.95  CALCIUM 9.1    Liver Function Tests:  Recent Labs Lab 07/04/14 2038  AST 35  ALT 51  ALKPHOS 55  BILITOT 1.0  PROT 6.8  ALBUMIN 4.2    CBC:  Recent Labs Lab 07/04/14 2038 07/05/14 0528  WBC 4.6 5.0  HGB 14.9 14.8  HCT 42.3 43.4  MCV 91.6 93.5  PLT 170 174    Cardiac Enzymes:  Recent Labs Lab 07/05/14 0236 07/05/14 0528  TROPONINI 0.16* 0.17*    BNP: Invalid input(s): POCBNP     Impression/Recommendations  1. NSTEMI - previous hx of non-obstructive CAD, cath 10/2013 with moderate LAD lesion not significant by FFR - ongoing chest pain on NG gtt, trop up to 0.17. Intermittent TWIs inferior leads on EKG - on ASA 81, hep gtt, lisinopril 10, NG gtt - will plan for transfer to [facility] today for cath given ongoing chest pain.  - start high dose statin, low dose beta blocker  Carlyle Dolly, M.D.

## 2014-07-05 NOTE — Progress Notes (Signed)
Patient received morphine, nitroglycerin drip has been titrated for patient's comfort. No relief achieved. Dr Ernestina Patches was notified and currently is at the bedside. Continue to monitor the patient.

## 2014-07-05 NOTE — Interval H&P Note (Signed)
Cath Lab Visit (complete for each Cath Lab visit)  Clinical Evaluation Leading to the Procedure:   ACS: Yes.    Non-ACS:    Anginal Classification: CCS IV  Anti-ischemic medical therapy: Maximal Therapy (2 or more classes of medications)  Non-Invasive Test Results: No non-invasive testing performed  Prior CABG: No previous CABG      History and Physical Interval Note:  07/05/2014 11:15 AM  Richard Simpson  has presented today for surgery, with the diagnosis of urgent  The various methods of treatment have been discussed with the patient and family. After consideration of risks, benefits and other options for treatment, the patient has consented to  Procedure(s): Left Heart Cath and Coronary Angiography (N/A) as a surgical intervention .  The patient's history has been reviewed, patient examined, no change in status, stable for surgery.  I have reviewed the patient's chart and labs.  Questions were answered to the patient's satisfaction.     Azelyn Batie A

## 2014-07-05 NOTE — Progress Notes (Signed)
Pt complaining of chest pain. Pt also states he is becoming worked up and anxious. Administered Klonopin for anxiety. Administered nitroglycerin for chest pain. First nitro, pt states chest pain is still unrelieved. Second nitro administered five minutes later, pt states chest pain is still unrelieved. Administered third dose of nitro 5 minutes later and chest pain still unrelieved. Notified MD (Dr. Ernestina Patches). MD ordered verbal orders of 1-2 mg of Morphine q 3hrs prn for chest pain.

## 2014-07-05 NOTE — Progress Notes (Signed)
Pt states that he has an appointment with the [facility] this a.m. At 10:45 a.m. Pt states he hopes to be discharged by then, and inquired about outpatient cardiac MDs. Pt states he does have a cardiologist he sees in [place]. Will put in a sticky note to the treatment team and MD.

## 2014-07-05 NOTE — Progress Notes (Signed)
[facility] here to transport patient to cath lab. Schonewitz, Eulis Canner 07/05/2014

## 2014-07-06 DIAGNOSIS — I249 Acute ischemic heart disease, unspecified: Secondary | ICD-10-CM

## 2014-07-06 LAB — CBC
HCT: 40.7 % (ref 39.0–52.0)
Hemoglobin: 14.4 g/dL (ref 13.0–17.0)
MCH: 32.1 pg (ref 26.0–34.0)
MCHC: 35.4 g/dL (ref 30.0–36.0)
MCV: 90.8 fL (ref 78.0–100.0)
PLATELETS: 170 10*3/uL (ref 150–400)
RBC: 4.48 MIL/uL (ref 4.22–5.81)
RDW: 12.7 % (ref 11.5–15.5)
WBC: 6.7 10*3/uL (ref 4.0–10.5)

## 2014-07-06 LAB — BASIC METABOLIC PANEL
Anion gap: 11 (ref 5–15)
BUN: 9 mg/dL (ref 6–20)
CHLORIDE: 105 mmol/L (ref 101–111)
CO2: 22 mmol/L (ref 22–32)
CREATININE: 0.93 mg/dL (ref 0.61–1.24)
Calcium: 8.9 mg/dL (ref 8.9–10.3)
Glucose, Bld: 95 mg/dL (ref 65–99)
POTASSIUM: 3.7 mmol/L (ref 3.5–5.1)
SODIUM: 138 mmol/L (ref 135–145)

## 2014-07-06 NOTE — Progress Notes (Signed)
CARDIAC REHAB PHASE I   PRE:  Rate/Rhythm: 76 SR    BP: sitting 116/76    SaO2:   MODE:  Ambulation: 690 ft   POST:  Rate/Rhythm: 93 SR    BP: sitting 110/80     SaO2:   Pt thankful that he can walk without being significantly SOB. Sts he hasn't been able to walk this far in 1 yr. Sts he feels only slightly SOB toward end. Ed completed with pt and wife. Voiced understanding. Reinforced importance of Brilinta. Pt does dip tobacco and is not interested in quitting. Reinforced significance of tobacco and CAD. Pt voiced understanding. Pt is somewhat interested in [facility] and will send referral to [facility].  [phone]   Josephina [place] CES, ACSM 07/06/2014 2:35 PM

## 2014-07-06 NOTE — Progress Notes (Signed)
Subjective:  No chest pain. Ambulated early this am.  Objective:   Vital Signs : Filed Vitals:   07/06/14 0300 07/06/14 0400 07/06/14 0500 07/06/14 0600  BP: 112/58 112/79 131/92 107/85  Pulse: 55 55 67 53  Temp:  97.6 F (36.4 C)    TempSrc:  Oral    Resp:      Height:      Weight:      SpO2: 99% 96% 100% 99%    Intake/Output from previous day:  Intake/Output Summary (Last 24 hours) at 07/06/14 0724 Last data filed at 07/06/14 0400  Gross per 24 hour  Intake 1752.06 ml  Output   3200 ml  Net -1447.94 ml     I/O since admission: -1797  Wt Readings from Last 3 Encounters:  07/05/14 116.5 kg (256 lb 13.4 oz)  12/18/13 118.752 kg (261 lb 12.8 oz)  12/04/13 119.804 kg (264 lb 1.9 oz)    Medications: . amLODipine  5 mg Oral Daily  . aspirin EC  81 mg Oral Daily  . atorvastatin  80 mg Oral q1800  . famotidine  20 mg Oral Daily  . ibuprofen  800 mg Oral BID  . lisinopril  10 mg Oral Daily  . loratadine  10 mg Oral Daily  . metoprolol tartrate  25 mg Oral BID  . sodium chloride  3 mL Intravenous Q12H  . ticagrelor  90 mg Oral BID    . sodium chloride Stopped (07/06/14 0400)  . nitroGLYCERIN Stopped (07/06/14 0300)    Physical Exam:   General appearance: alert, cooperative and no distress Neck: no adenopathy, no carotid bruit, no JVD, supple, symmetrical, trachea midline and thyroid not enlarged, symmetric, no tenderness/mass/nodules Lungs: clear to auscultation bilaterally Heart: regular rate and rhythm Abdomen: soft, non-tender; bowel sounds normal; no masses,  no organomegaly Extremities: no edema, redness or tenderness in the calves or thighs Pulses: 2+ and symmetric Skin: Skin color, texture, turgor normal. No rashes or lesions Neurologic: Grossly normal   Rate: 70  Rhythm: normal sinus rhythm  ECG (independently read by me): NSR at 70;  Lab Results:   Recent Labs  07/04/14 2038 07/06/14 0248  NA 139 138  K 3.5 3.7  CL 107 105  CO2 23  22  GLUCOSE 123* 95  BUN 18 9  CREATININE 0.95 0.93  CALCIUM 9.1 8.9     Recent Labs  07/04/14 2038 07/05/14 0528 07/06/14 0248  WBC 4.6 5.0 6.7  HGB 14.9 14.8 14.4  HCT 42.3 43.4 40.7  MCV 91.6 93.5 90.8  PLT 170 174 170     Recent Labs  07/05/14 0236 07/05/14 0528 07/05/14 1545  TROPONINI 0.16* 0.17* 18.22*    Lab Results  Component Value Date   TSH 1.580 09/23/2013     Recent Labs  07/04/14 2038  PROT 6.8  ALBUMIN 4.2  AST 35  ALT 51  ALKPHOS 55  BILITOT 1.0    Recent Labs  07/04/14 2038  INR 1.09   BNP (last 3 results) No results for input(s): BNP in the last 8760 hours.  ProBNP (last 3 results)  Recent Labs  12/18/13 1318  PROBNP 10.0     Lipid Panel  No results found for: CHOL, TRIG, HDL, CHOLHDL, VLDL, LDLCALC, LDLDIRECT     No results for input(s): HGBA1C in the last 72 hours.   Imaging:  Dg Chest Portable 1 View  07/04/2014   CLINICAL DATA:  Substernal chest pain for 2 days, with worsening  today. Some relief with nitroglycerin.  EXAM: PORTABLE CHEST - 1 VIEW  COMPARISON:  11/14/2013  FINDINGS: Low lung volumes and large patient habitus noted. Both lungs are grossly clear. Heart size is within normal limits.  IMPRESSION: Low lung volumes.  No acute findings.   Electronically Signed   By: Earle Gell M.D.   On: 07/04/2014 21:03      Assessment/Plan:   Principal Problem:   ACS (acute coronary syndrome) Active Problems:   Chest pain   Coronary arteriosclerosis   Fatigue   Dyspnea   NSTEMI (non-ST elevated myocardial infarction)   Coronary artery disease involving native coronary artery of native heart with angina pectoris  1. Day 1 s/p ACS/ NSTEMI due to 99% ostial ulcerated LAD stenosis with successful PCI/DES stenting. Trop inc to 18 yesterday. ECG from this am not yet done. Will re-check trop. On Ace-I, BB, statin. Will hold iboprofen for now. Reviewed angios with patient and wife.  2.. HTN  Currently controlled on  meds.  3.  Rheumatoid arthritis has seen Dr. Ouida Sills  4. Obesity  BMI 34  Will transfer to telemetry today.     Troy Sine, MD, [facility] 07/06/2014, 7:24 AM

## 2014-07-07 ENCOUNTER — Encounter (HOSPITAL_COMMUNITY): Payer: Self-pay | Admitting: Nurse Practitioner

## 2014-07-07 DIAGNOSIS — E785 Hyperlipidemia, unspecified: Secondary | ICD-10-CM | POA: Diagnosis present

## 2014-07-07 DIAGNOSIS — I255 Ischemic cardiomyopathy: Secondary | ICD-10-CM | POA: Diagnosis present

## 2014-07-07 DIAGNOSIS — Z9861 Coronary angioplasty status: Secondary | ICD-10-CM

## 2014-07-07 DIAGNOSIS — I214 Non-ST elevation (NSTEMI) myocardial infarction: Principal | ICD-10-CM

## 2014-07-07 DIAGNOSIS — I2510 Atherosclerotic heart disease of native coronary artery without angina pectoris: Secondary | ICD-10-CM | POA: Diagnosis present

## 2014-07-07 LAB — CBC
HCT: 42.4 % (ref 39.0–52.0)
HEMOGLOBIN: 15 g/dL (ref 13.0–17.0)
MCH: 32.1 pg (ref 26.0–34.0)
MCHC: 35.4 g/dL (ref 30.0–36.0)
MCV: 90.8 fL (ref 78.0–100.0)
Platelets: 166 10*3/uL (ref 150–400)
RBC: 4.67 MIL/uL (ref 4.22–5.81)
RDW: 12.7 % (ref 11.5–15.5)
WBC: 6.6 10*3/uL (ref 4.0–10.5)

## 2014-07-07 MED ORDER — METOPROLOL TARTRATE 25 MG PO TABS: 25.0000 mg | ORAL_TABLET | Freq: Two times a day (BID) | ORAL | Status: DC

## 2014-07-07 MED ORDER — ATORVASTATIN CALCIUM 80 MG PO TABS: 80.0000 mg | ORAL_TABLET | Freq: Every day | ORAL | Status: DC

## 2014-07-07 MED ORDER — TICAGRELOR 90 MG PO TABS: 90.0000 mg | ORAL_TABLET | Freq: Two times a day (BID) | ORAL | Status: DC

## 2014-07-07 NOTE — Progress Notes (Signed)
Subjective:  No chest pain. Ambulated this am. Able to walk much faster and longer duration without his previous symptoms.  Objective:   Vital Signs : Filed Vitals:   07/06/14 0921 07/06/14 1300 07/06/14 2049 07/07/14 0501  BP: 114/90 107/74 119/80 106/61  Pulse: 89 63 74 68  Temp:  98.2 F (36.8 C) 99 F (37.2 C) 98 F (36.7 C)  TempSrc:  Oral Oral Oral  Resp:  16 18 18   Height:      Weight:      SpO2:  100% 99% 96%    Intake/Output from previous day:  Intake/Output Summary (Last 24 hours) at 07/07/14 0749 Last data filed at 07/06/14 0925  Gross per 24 hour  Intake      6 ml  Output      0 ml  Net      6 ml     I/O since admission: -2391  Wt Readings from Last 3 Encounters:  07/05/14 116.5 kg (256 lb 13.4 oz)  12/18/13 118.752 kg (261 lb 12.8 oz)  12/04/13 119.804 kg (264 lb 1.9 oz)    Medications: . amLODipine  5 mg Oral Daily  . aspirin EC  81 mg Oral Daily  . atorvastatin  80 mg Oral q1800  . famotidine  20 mg Oral Daily  . lisinopril  10 mg Oral Daily  . loratadine  10 mg Oral Daily  . metoprolol tartrate  25 mg Oral BID  . sodium chloride  3 mL Intravenous Q12H  . ticagrelor  90 mg Oral BID    . sodium chloride Stopped (07/06/14 0400)  . nitroGLYCERIN Stopped (07/06/14 0300)    Physical Exam:   General appearance: alert, cooperative and no distress Neck: no adenopathy, no carotid bruit, no JVD, supple, symmetrical, trachea midline and thyroid not enlarged, symmetric, no tenderness/mass/nodules Lungs: clear to auscultation bilaterally Heart: regular rate and rhythm Abdomen: soft, non-tender; bowel sounds normal; no masses,  no organomegaly Extremities: no edema, redness or tenderness in the calves or thighs Pulses: 2+ and symmetric Skin: Skin color, texture, turgor normal. No rashes or lesions Neurologic: Grossly normal   Rate: 64  Rhythm: normal sinus rhythm   07/06/14 ECG (independently read by me): NSR at 64; nondiagnostig  anterolateral T changes  07/05/14 ECG (independently read by me): NSR at 70;  Lab Results:   Recent Labs  07/04/14 2038 07/06/14 0248  NA 139 138  K 3.5 3.7  CL 107 105  CO2 23 22  GLUCOSE 123* 95  BUN 18 9  CREATININE 0.95 0.93  CALCIUM 9.1 8.9     Recent Labs  07/05/14 0528 07/06/14 0248 07/07/14 0457  WBC 5.0 6.7 6.6  HGB 14.8 14.4 15.0  HCT 43.4 40.7 42.4  MCV 93.5 90.8 90.8  PLT 174 170 166     Recent Labs  07/05/14 0236 07/05/14 0528 07/05/14 1545  TROPONINI 0.16* 0.17* 18.22*    Lab Results  Component Value Date   TSH 1.580 09/23/2013     Recent Labs  07/04/14 2038  PROT 6.8  ALBUMIN 4.2  AST 35  ALT 51  ALKPHOS 55  BILITOT 1.0    Recent Labs  07/04/14 2038  INR 1.09   BNP (last 3 results) No results for input(s): BNP in the last 8760 hours.  ProBNP (last 3 results)  Recent Labs  12/18/13 1318  PROBNP 10.0     Lipid Panel  No results found for: CHOL, TRIG, HDL, CHOLHDL, VLDL, LDLCALC, LDLDIRECT  No results for input(s): HGBA1C in the last 72 hours.   Imaging:  No results found.    Assessment/Plan:   Principal Problem:   ACS (acute coronary syndrome) Active Problems:   Chest pain   Coronary arteriosclerosis   Fatigue   Dyspnea   NSTEMI (non-ST elevated myocardial infarction)   Coronary artery disease involving native coronary artery of native heart with angina pectoris  1. Day 2 s/p ACS/ NSTEMI due to 99% ostial ulcerated LAD stenosis with successful PCI/DES stenting. Trop inc to 18 yesterday. On Ace-I, BB, statin. Will hold iboprofen for now. Reviewed angios with patient and wife. Anticipate full return of function; plan for subsequent outpatient echo. Continue ASA/Brillinta.  2.. HTN  Currently controlled on meds.  3.  Rheumatoid arthritis has seen Dr. Ouida Sills  4. Obesity  BMI 34  5. H/O chewing tobacco since age 70. Discussed complete cessation of all nicotine products and adverse  consequences.  Will DC today.  Plan for extender evaluation in 1 week prior to [place] going [place] the beach for 2 weeks the end of June.  Pt and wife have requested that he would like to be followed by me as an outpatient. Can arrange f/u with me in July.   Troy Sine, MD, [facility] 07/07/2014, 7:49 AM

## 2014-07-07 NOTE — Progress Notes (Signed)
Pt/family given discharge instructions, medication lists, follow up appointments, and when to call the doctor.  Pt/family verbalizes understanding. Medication sheets given for metoprolol, and atorvastatin. Payton Emerald, RN

## 2014-07-07 NOTE — Discharge Summary (Signed)
Discharge Summary   Patient ID: Richard Simpson,  MRN: [id number], DOB/AGE: 1970-07-23 44 y.o.  Admit date: 07/04/2014 Discharge date: 07/07/2014  Primary Care Provider: Sharilyn Sites CABOT Primary Cardiologist: Corky Downs, MD   Discharge Diagnoses Principal Problem:   NSTEMI (non-ST elevated myocardial infarction)  **s/p PCI/DES to the LAD this admission.   Active Problems:   ACS (acute coronary syndrome)   CAD (coronary artery disease)   Obesity   Hypertension   Ischemic cardiomyopathy  **EF 45-50% by LV gram this admission.   Rheumatoid arthritis   GERD (gastroesophageal reflux disease)   RLS (restless legs syndrome)   Dyslipidemia  Allergies Allergies  Allergen Reactions  . Prednisone Other (See Comments)    Makes angry   Procedures  Cardiac Catheterization and Percutaneous Coronary Intervention 6.10.2016  Coronary Findings     Dominance: Right    Left Anterior Descending   . Ost LAD lesion, 99% stenosed. eccentric, ulcerative .   Marland Kitchen Ost LAD to Prox LAD lesion, 70% stenosed.   . Mid LAD lesion, 50% stenosed.   Marland Kitchen PCI: The ostial, proximal, and mid LAD lesions were successfully treated using a 3.5 x 20 mm Synergy DES.  Marland Kitchen There is no residual stenosis post intervention.         left circumflex    .  normal     Right Coronary Artery   . Mid RCA lesion, 20% stenosed.   . Dist RCA lesion, 10% stenosed.    _____________   History of Present Illness  44 year old male with a prior history of nonobstructive CAD, hypertension, and tobacco abuse who was in his usual state of health until several days prior to admission when he began to experience progressive sharp and heavy midsternal chest discomfort prompting him to present to [facility] on 07/04/2014. There, troponin was elevated at 0.17. ECG was notable for inferior T-wave inversions. In the setting of acute coronary syndrome, patient was seen by cardiology and decision was made to transfer to [facility] for  further evaluation.  Hospital Course  Following arrival to [facility], patient underwent urgent diagnostic catheterization in the setting of ongoing chest discomfort. This revealed severe ostial, proximal, and mid LAD disease. The left circumflex was normal with mild nonobstructive CAD in the mid and distal right coronary artery. EF was 45-50% by left ventriculography. The LAD was felt to be the culprit vessel and this was successfully stented using a 3.5 x 20 mm Synergy drug-eluting stent. The patient tolerated the procedure well and postprocedure bumped his troponin to a peak of 18.22. He had no further chest pain and was maintained on aspirin, brilinta, beta blocker, ACE inhibitor, and high potency statin therapy. He has been ambulatory without recurrent symptoms or limitations and has been seen by cardiac rehabilitation. He has also been counseled on the importance of tobacco cessation (chewing tobacco). He will be discharged home today in good condition and we will arrange for follow-up within the next 7 days. He will require repeat echocardiography in 3 months to reevaluate LV function.  Discharge Vitals Blood pressure 106/61, pulse 68, temperature 98 F (36.7 C), temperature source Oral, resp. rate 18, height 6\' 1"  (1.854 m), weight 256 lb 13.4 oz (116.5 kg), SpO2 96 %.  Filed Weights   07/04/14 2017 07/04/14 2218 07/05/14 1445  Weight: 260 lb (117.935 kg) 258 lb (117.028 kg) 256 lb 13.4 oz (116.5 kg)    Labs  CBC  Recent Labs  07/06/14 0248 07/07/14 0457  WBC 6.7 6.6  HGB 14.4 15.0  HCT 40.7 42.4  MCV 90.8 90.8  PLT 170 638   Basic Metabolic Panel  Recent Labs  07/04/14 2038 07/06/14 0248  NA 139 138  K 3.5 3.7  CL 107 105  CO2 23 22  GLUCOSE 123* 95  BUN 18 9  CREATININE 0.95 0.93  CALCIUM 9.1 8.9   Liver Function Tests  Recent Labs  07/04/14 2038  AST 35  ALT 51  ALKPHOS 55  BILITOT 1.0  PROT 6.8  ALBUMIN 4.2   Cardiac Enzymes  Recent Labs   07/05/14 0236 07/05/14 0528 07/05/14 1545  TROPONINI 0.16* 0.17* 18.22*   Disposition  Pt is being discharged home today in good condition.  Follow-up Plans & Appointments  Follow-up Information    Follow up with Troy Sine, MD.   Specialty:  Cardiology   Why:  we will arrange for follow-up with Dr. Evette Georges NP/PA within the next 7 days and contact you.   Contact information:   [address] [place] [address] [phone]       Follow up with Purvis Kilts, MD.   Specialty:  Family Medicine   Why:  As needed   Contact information:   [address] [place] [address] [phone]      Discharge Medications    Medication List    STOP taking these medications        ibuprofen 800 MG tablet  Commonly known as:  ADVIL,MOTRIN      TAKE these medications        amLODipine 5 MG tablet  Commonly known as:  NORVASC  Take 1 tablet (5 mg total) by mouth daily.     aspirin EC 81 MG tablet  Take 1 tablet (81 mg total) by mouth daily.     atorvastatin 80 MG tablet  Commonly known as:  LIPITOR  Take 1 tablet (80 mg total) by mouth daily at 6 PM.     clonazePAM 0.5 MG tablet  Commonly known as:  KLONOPIN  Take 0.5 mg by mouth 2 (two) times daily as needed for anxiety.     lisinopril 10 MG tablet  Commonly known as:  PRINIVIL  Take 1 tablet (10 mg total) by mouth daily.     loratadine 10 MG tablet  Commonly known as:  CLARITIN  Take 10 mg by mouth daily.     metoprolol tartrate 25 MG tablet  Commonly known as:  LOPRESSOR  Take 1 tablet (25 mg total) by mouth 2 (two) times daily.     nitroGLYCERIN 0.4 MG SL tablet  Commonly known as:  NITROSTAT  Place 1 tablet (0.4 mg total) under the tongue every 5 (five) minutes as needed for chest pain.     ranitidine 150 MG capsule  Commonly known as:  ZANTAC  Take 150 mg by mouth 2 (two) times daily.     ticagrelor 90 MG Tabs tablet  Commonly known as:  BRILINTA  Take 1 tablet (90  mg total) by mouth 2 (two) times daily.       Outstanding Labs/Studies  Follow-up lipids and lft's in 8 wks. Follow-up echo in 3 months.  Duration of Discharge Encounter   Greater than 30 minutes including physician time.  Signed, Murray Hodgkins NP 07/07/2014, 10:03 AM

## 2014-07-07 NOTE — Discharge Instructions (Signed)

## 2014-07-07 NOTE — Progress Notes (Signed)
Patient developed an area of blisters and redness on the skin next to the tegaderm dressing that was on his radial cath site. Pt states the area is itching.

## 2014-07-08 ENCOUNTER — Encounter (HOSPITAL_COMMUNITY): Payer: Self-pay | Admitting: Cardiovascular Disease

## 2014-07-08 ENCOUNTER — Telehealth (HOSPITAL_COMMUNITY): Payer: Self-pay | Admitting: *Deleted

## 2014-07-08 MED FILL — Heparin Sodium (Porcine) 2 Unit/ML in Sodium Chloride 0.9%: INTRAMUSCULAR | Qty: 2000 | Status: AC

## 2014-07-08 MED FILL — Lidocaine HCl Local Preservative Free (PF) Inj 1%: INTRAMUSCULAR | Qty: 30 | Status: AC

## 2014-07-10 ENCOUNTER — Encounter: Payer: Self-pay | Admitting: Pulmonary Disease

## 2014-07-10 ENCOUNTER — Ambulatory Visit (INDEPENDENT_AMBULATORY_CARE_PROVIDER_SITE_OTHER): Payer: PRIVATE HEALTH INSURANCE | Admitting: Pulmonary Disease

## 2014-07-10 VITALS — BP 100/72 | HR 84 | Ht 73.0 in | Wt 252.8 lb

## 2014-07-10 DIAGNOSIS — R9389 Abnormal findings on diagnostic imaging of other specified body structures: Secondary | ICD-10-CM

## 2014-07-10 DIAGNOSIS — R938 Abnormal findings on diagnostic imaging of other specified body structures: Secondary | ICD-10-CM | POA: Diagnosis not present

## 2014-07-10 DIAGNOSIS — R0600 Dyspnea, unspecified: Secondary | ICD-10-CM | POA: Diagnosis not present

## 2014-07-10 NOTE — Progress Notes (Addendum)
Subjective:    Patient ID: Richard Simpson, male    DOB: 12/30/1970, 44 y.o.   MRN: [id number]  HPI   Chief Complaint  Patient presents with  . Advice Only    Referred by Dr. Ouida Sills; Patinet was hospitalized on Friday 6/10 for heart attack, patient had 99% blockage in Aorta.  Patient here today for abnormal CT scan.     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 He was diagnosed with psoriatic arthritis 05/2014 - after chronic joint pain & stiffness, psoraitic rash on lower legs,, underwent treatment with Biologics with good response Myrtie Hawk). He however developed persistent dyspnea, Biologics were stopped. CT angios in 06/2013 showed left upper lobe groundglass infiltrates, no pulmonary embolism-hence the referral. He denies wheezing, fevers or sputum production. No sick contacts or preceding URI symptoms. He reports persistent dyspnea for many months. Shortly thereafter he was hospitalized with chest pain and underwent emergent And stent to LAD. He feels much improved-his dyspnea as improved and he can walk 100 yards. He smoked less than 10 pack years and quit at age 66. He denies significant sinus disease or reflux. 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 CAD (coronary artery disease)     a. 10/2013 Cath: mod LAD dzs->nl FFR-> medically managed;  b. 06/2014 NSTEMI/PCi: LAD 99ost, 70p, 80m (entire area covered with a 3.5x20 Synergy DES), LCX nl, RCA 59m, 10d, EF  45-50%.  . Headache(784.0)   . RLS (restless legs syndrome)   . Primary snoring   . Obesity   . Hypertension   . Dyslipidemia   . Rheumatoid arthritis   . Ischemic cardiomyopathy     a. 06/2014 EF 45-50% by LV gram.   Past Surgical History  Procedure Laterality Date  . Gallbladder surgery    . Throat surgery      nodules removed from throat as child  . Cholecystectomy      10 yrs ago-jenkins  . Mass excision  09/09/2010    Procedure: EXCISION MASS;  Surgeon: Arther Abbott, MD;  Location: [facility];  Service: Orthopedics;  Laterality: Left;  Excision Mass Left Long Finger  . Colonoscopy N/A 03/23/2013    Procedure: COLONOSCOPY;  Surgeon: Daneil Dolin, MD;  Location: [facility];  Service: Endoscopy;  Laterality: N/A;  2:15-moved to 1030 Staff notified pt  . Left heart catheterization with coronary angiogram N/A 11/23/2013    Procedure: LEFT HEART CATHETERIZATION WITH CORONARY ANGIOGRAM;  Surgeon: Jettie Booze, MD;  Location: [facility] CATH LAB;  Service: Cardiovascular;  Laterality: N/A;  . Fractional flow reserve wire  11/23/2013    Procedure: FRACTIONAL FLOW RESERVE WIRE;  Surgeon: Jettie Booze, MD;  Location: [facility] CATH LAB;  Service: Cardiovascular;;  . Cardiac catheterization N/A 07/05/2014    Procedure: Left Heart Cath and Coronary Angiography;  Surgeon: Troy Sine, MD;  Location: [place] CV LAB;  Service: Cardiovascular;  Laterality: N/A;  . Cardiac catheterization  07/05/2014  Procedure: Coronary Stent Intervention;  Surgeon: Troy Sine, MD;  Location: [place] CV LAB;  Service: Cardiovascular;;    Allergies  Allergen Reactions  . Prednisone Other (See Comments)    Makes angry    History   Social History  . Marital Status: Married    Spouse Name: [name]  . Number of Children: 2  . Years of Education: 12th grade   Occupational History  . Engineer, structural   .     Social History Main Topics  . Smoking status: Former Smoker -- 0.25 packs/day     Types: Cigarettes    Quit date: 09/04/2003  . Smokeless tobacco: Current User    Types: Chew  . Alcohol Use: No     Comment: rare  . Drug Use: No  . Sexual Activity: Yes   Other Topics Concern  . Not on file   Social History Narrative   Patient lives at home with his wife [name]).    Patient works full time Dentist..   Education high school.   Right handed.   Caffeine one cup of coffee daily and one soda.    Family History  Problem Relation Age of Onset  . Arthritis Paternal Uncle   . Cancer Maternal Grandfather   . Anesthesia problems Neg Hx   . Hypotension Neg Hx   . Malignant hyperthermia Neg Hx   . Pseudochol deficiency Neg Hx   . Heart attack Neg Hx   . Stroke Neg Hx   . Hypertension Mother   . Hypertension Maternal Grandmother   . Diabetes    . Diabetes Mother   . Diabetes Maternal Grandmother   . Diabetes    . Diabetes Paternal Grandfather     Review of Systems  Constitutional: Negative for fever, chills, activity change, appetite change and unexpected weight change.  HENT: Negative for congestion, dental problem, postnasal drip, rhinorrhea, sneezing, sore throat, trouble swallowing and voice change.   Eyes: Negative for visual disturbance.  Respiratory: Positive for shortness of breath. Negative for cough and choking.   Cardiovascular: Negative for chest pain and leg swelling.  Gastrointestinal: Negative for nausea, vomiting and abdominal pain.  Genitourinary: Negative for difficulty urinating.  Musculoskeletal: Negative for arthralgias.  Skin: Negative for rash.  Psychiatric/Behavioral: Negative for behavioral problems and confusion.       Objective:   Physical Exam  Gen. Pleasant, well-nourished, in no distress, normal affect ENT - no lesions, no post nasal drip Neck: No JVD, no thyromegaly, no carotid bruits Lungs: no use of accessory muscles, no dullness to percussion, clear without rales or rhonchi  Cardiovascular: Rhythm regular,  heart sounds  normal, no murmurs or gallops, no peripheral edema Abdomen: soft and non-tender, no hepatosplenomegaly, BS normal. Musculoskeletal: No deformities, no cyanosis or clubbing Neuro:  alert, non focal       Assessment & Plan:

## 2014-07-10 NOTE — Assessment & Plan Note (Addendum)
Unclear cause of- ground glass patch in your lung - favor inflammation as the cause. Doubt infection since he did not have any significant prodrome. Nothing to suggest aspiration Repeat CT scan in 3 months Obtain records to clarify  ? RA vs psoriasis

## 2014-07-10 NOTE — Patient Instructions (Signed)
You have ground glass patch in your lung - favor inflammation as the cause Repeat CT scan in 3 months

## 2014-07-10 NOTE — Assessment & Plan Note (Signed)
If not improved after cardiac Rx, consider PFTs

## 2014-07-12 ENCOUNTER — Ambulatory Visit (INDEPENDENT_AMBULATORY_CARE_PROVIDER_SITE_OTHER): Payer: PRIVATE HEALTH INSURANCE | Admitting: Nurse Practitioner

## 2014-07-12 ENCOUNTER — Encounter: Payer: Self-pay | Admitting: Nurse Practitioner

## 2014-07-12 VITALS — BP 102/80 | HR 77 | Ht 73.0 in | Wt 250.8 lb

## 2014-07-12 DIAGNOSIS — I25119 Atherosclerotic heart disease of native coronary artery with unspecified angina pectoris: Secondary | ICD-10-CM | POA: Diagnosis not present

## 2014-07-12 DIAGNOSIS — I10 Essential (primary) hypertension: Secondary | ICD-10-CM

## 2014-07-12 DIAGNOSIS — E669 Obesity, unspecified: Secondary | ICD-10-CM

## 2014-07-12 DIAGNOSIS — I222 Subsequent non-ST elevation (NSTEMI) myocardial infarction: Secondary | ICD-10-CM | POA: Diagnosis not present

## 2014-07-12 DIAGNOSIS — E785 Hyperlipidemia, unspecified: Secondary | ICD-10-CM

## 2014-07-12 DIAGNOSIS — I255 Ischemic cardiomyopathy: Secondary | ICD-10-CM | POA: Diagnosis not present

## 2014-07-12 DIAGNOSIS — I214 Non-ST elevation (NSTEMI) myocardial infarction: Secondary | ICD-10-CM

## 2014-07-12 MED ORDER — LOSARTAN POTASSIUM 25 MG PO TABS
25.0000 mg | ORAL_TABLET | Freq: Every day | ORAL | Status: DC
Start: 2014-07-12 — End: 2015-04-14

## 2014-07-12 NOTE — Patient Instructions (Signed)
Medication Instructions:  Your physician has recommended you make the following change in your medication:   1- STOP Lisinopril   2- START Losartan 25 mg by mouth daily   Labwork: NONE  Testing/Procedures: NONE  Follow-Up: Your physician recommends that you schedule a follow-up appointment in: 1 month with Dr. Claiborne Billings. (Per Patient, Dr. Claiborne Billings stated to work him in if no openings, also in chart on date of last heart cath.)   Any Other Special Instructions Will Be Listed Below (If Applicable).

## 2014-07-12 NOTE — Progress Notes (Signed)
Patient Name: Richard Simpson Date of Encounter: 07/12/2014  Primary Care Provider:  Purvis Kilts, MD Primary Cardiologist:  Corky Downs, MD   Chief Complaint  44 year old male status post recent non-ST elevation MI who presents for follow-up.  Past Medical History   Past Medical History  Diagnosis Date  . CAD (coronary artery disease)     a. 10/2013 Cath: mod LAD dzs->nl FFR-> medically managed;  b. 06/2014 NSTEMI/PCi: LAD 99ost, 70p, 14m (entire area covered with a 3.5x20 Synergy DES), LCX nl, RCA 43m, 10d, EF 45-50%.  . Headache(784.0)   . RLS (restless legs syndrome)   . Primary snoring   . Obesity   . Hypertension   . Dyslipidemia   . Rheumatoid arthritis   . Ischemic cardiomyopathy     a. 06/2014 EF 45-50% by LV gram.   Past Surgical History  Procedure Laterality Date  . Gallbladder surgery    . Throat surgery      nodules removed from throat as child  . Cholecystectomy      10 yrs ago-jenkins  . Mass excision  09/09/2010    Procedure: EXCISION MASS;  Surgeon: Arther Abbott, MD;  Location: [facility];  Service: Orthopedics;  Laterality: Left;  Excision Mass Left Long Finger  . Colonoscopy N/A 03/23/2013    Procedure: COLONOSCOPY;  Surgeon: Daneil Dolin, MD;  Location: [facility];  Service: Endoscopy;  Laterality: N/A;  2:15-moved to 1030 Staff notified pt  . Left heart catheterization with coronary angiogram N/A 11/23/2013    Procedure: LEFT HEART CATHETERIZATION WITH CORONARY ANGIOGRAM;  Surgeon: Jettie Booze, MD;  Location: [facility] CATH LAB;  Service: Cardiovascular;  Laterality: N/A;  . Fractional flow reserve wire  11/23/2013    Procedure: FRACTIONAL FLOW RESERVE WIRE;  Surgeon: Jettie Booze, MD;  Location: [facility] CATH LAB;  Service: Cardiovascular;;  . Cardiac catheterization N/A 07/05/2014    Procedure: Left Heart Cath and Coronary Angiography;  Surgeon: Troy Sine, MD;  Location: [place] CV LAB;  Service: Cardiovascular;  Laterality: N/A;  .  Cardiac catheterization  07/05/2014    Procedure: Coronary Stent Intervention;  Surgeon: Troy Sine, MD;  Location: [place] CV LAB;  Service: Cardiovascular;;    Allergies  Allergies  Allergen Reactions  . Prednisone Other (See Comments)    Makes angry    HPI  44 year old male with the above problem list. He has a history of coronary artery disease and is status post catheterization in October 2015 revealing moderate LAD disease with a normal fractional flow reserve. He was medically managed. Since then, he's had progressive dyspnea on exertion and subsequently developed substernal chest discomfort. He was admitted to [facility] earlier this month following transfer from [facility] related to a non-ST elevation MI. He underwent diagnostic catheterization revealing severe LAD disease and otherwise nonobstructive disease. He underwent successful PCI and stenting with a Synergy drug-eluting stent. He was placed on aspirin, statin, ACE inhibitor, beta blocker, and Brilinta therapy. EF was 45-50% by ventriculography. Since discharge, he has done well without any recurrence of chest discomfort. He has been walking some and has noted significant improvement in his exercise tolerance though as its only been 1 week since discharge, his activity remains somewhat limited. He denies PND, orthopnea, dizziness, syncope, edema, or early satiety. He is compliant with his medications and has noticed a nonproductive cough since starting lisinopril. He followed up with pulmonology on the 15th related to groundglass attenuation opacities noted on  CT scan last May with recommendation for follow-up CT in 3 months. It's felt at this point that groundglass patch may be related to inflammation.  Home Medications  Prior to Admission medications   Medication Sig Start Date End Date Taking? Authorizing Provider  amLODipine (NORVASC) 5 MG tablet Take 1 tablet (5 mg total) by mouth daily. 04/11/14  Yes Jettie Booze, MD  aspirin EC 81 MG tablet Take 1 tablet (81 mg total) by mouth daily. 12/04/13  Yes Burtis Junes, NP  atorvastatin (LIPITOR) 80 MG tablet Take 1 tablet (80 mg total) by mouth daily at 6 PM. 07/07/14  Yes Rogelia Mire, NP  clonazePAM (KLONOPIN) 0.5 MG tablet Take 0.5 mg by mouth 2 (two) times daily as needed for anxiety.   Yes Historical Provider, MD  loratadine (CLARITIN) 10 MG tablet Take 10 mg by mouth daily.    Yes Historical Provider, MD  metoprolol tartrate (LOPRESSOR) 25 MG tablet Take 1 tablet (25 mg total) by mouth 2 (two) times daily. 07/07/14  Yes Rogelia Mire, NP  nitroGLYCERIN (NITROSTAT) 0.4 MG SL tablet Place 1 tablet (0.4 mg total) under the tongue every 5 (five) minutes as needed for chest pain. 12/04/13  Yes Burtis Junes, NP  ranitidine (ZANTAC) 150 MG capsule Take 150 mg by mouth 2 (two) times daily.   Yes Historical Provider, MD  ticagrelor (BRILINTA) 90 MG TABS tablet Take 1 tablet (90 mg total) by mouth 2 (two) times daily. 07/07/14  Yes Rogelia Mire, NP  losartan (COZAAR) 25 MG tablet Take 1 tablet (25 mg total) by mouth daily. 07/12/14   Rogelia Mire, NP    Review of Systems  As above, patient is noted in improvement in exercise tolerance.  He denies chest pain, palpitations, dyspnea, pnd, orthopnea, n, v, dizziness, syncope, edema, weight gain, or early satiety.  All other systems reviewed and are otherwise negative except as noted above.  Physical Exam  VS:  BP 102/80 mmHg  Pulse 77  Ht 6\' 1"  (1.854 m)  Wt 250 lb 12.8 oz (113.762 kg)  BMI 33.10 kg/m2  SpO2 97% , BMI Body mass index is 33.1 kg/(m^2). GEN: Well nourished, well developed, in no acute distress. HEENT: normal. Neck: Supple, no JVD, carotid bruits, or masses. Cardiac: RRR, no murmurs, rubs, or gallops. No clubbing, cyanosis, edema.  Radials/DP/PT 2+ and equal bilaterally. Right wrist catheterization site without bleeding, bruit, or hematoma. Respiratory:   Respirations regular and unlabored, clear to auscultation bilaterally. GI: Soft, nontender, nondistended, BS + x 4. MS: no deformity or atrophy. Skin: warm and dry, no rash. Neuro:  Strength and sensation are intact. Psych: Normal affect.  Accessory Clinical Findings  ECG - regular sinus rhythm, 77, no acute ST or T changes.  Assessment & Plan  1.  Non-ST segment elevation myocardial infarction, subsequent episode of care/coronary artery disease: Status post recent admission with non-STEMI and subsequent PCI and stenting of the LAD with a Synergy drug-eluting stent. Patient has been maintained on aspirin, statin, beta blocker, ACE inhibitor, and Brilinta therapy and is doing well without recurrence of chest pain or dyspnea. She has noticed an improvement in his exercise tolerance and is eager to be more active and enroll in cardiac rehabilitation in July. He has had a nagging cough since starting lisinopril and we will discontinue this in favor of losartan 25 mg daily.  2. Ischemic cardiopathy: EF was 45-50% by ventriculography at the time of PCI. Follow-up echo in  3 months. Continue beta blocker and as above, I will switch his ACE inhibitor to ARB secondary to a cough.  3. Essential hypertension: This is well controlled on beta blocker, calcium channel blocker, and ACE inhibitor therapy. As above, switching Ace to ARB secondary to cough.  4. Dyslipidemia: Continue high potency statin therapy.  He will need follow-up lipids and LFTs upon follow-up visit next month with Dr. Claiborne Billings.  5. Obesity:  He will enroll in cardiac rehab.  Orientation is scheduled for July.  6. Rheumatoid arthritis: He is followed by rheumatology. He is currently off of NSAID therapy in the setting of ongoing aspirin and Brilinta therapy.  7. Disposition: Follow-up with Dr. Claiborne Billings in July.  Murray Hodgkins, NP 07/12/2014, 1:03 PM

## 2014-07-23 ENCOUNTER — Other Ambulatory Visit: Payer: Self-pay | Admitting: Interventional Cardiology

## 2014-07-25 ENCOUNTER — Other Ambulatory Visit: Payer: Self-pay

## 2014-07-25 MED ORDER — AMLODIPINE BESYLATE 5 MG PO TABS: 5.0000 mg | ORAL_TABLET | Freq: Every day | ORAL | Status: DC

## 2014-07-26 ENCOUNTER — Telehealth: Payer: Self-pay | Admitting: *Deleted

## 2014-07-26 NOTE — Telephone Encounter (Signed)
Faxed cardiac rehab phase ll  Order faxed to cardiac rehab in [place].

## 2014-08-05 ENCOUNTER — Ambulatory Visit (INDEPENDENT_AMBULATORY_CARE_PROVIDER_SITE_OTHER): Payer: PRIVATE HEALTH INSURANCE | Admitting: Cardiovascular Disease

## 2014-08-05 ENCOUNTER — Encounter: Payer: Self-pay | Admitting: Cardiovascular Disease

## 2014-08-05 VITALS — BP 122/86 | HR 65 | Ht 73.0 in | Wt 255.3 lb

## 2014-08-05 DIAGNOSIS — I25810 Atherosclerosis of coronary artery bypass graft(s) without angina pectoris: Secondary | ICD-10-CM | POA: Diagnosis not present

## 2014-08-05 DIAGNOSIS — I10 Essential (primary) hypertension: Secondary | ICD-10-CM | POA: Diagnosis not present

## 2014-08-05 DIAGNOSIS — E669 Obesity, unspecified: Secondary | ICD-10-CM

## 2014-08-05 DIAGNOSIS — R0600 Dyspnea, unspecified: Secondary | ICD-10-CM

## 2014-08-05 DIAGNOSIS — Z79899 Other long term (current) drug therapy: Secondary | ICD-10-CM

## 2014-08-05 DIAGNOSIS — I214 Non-ST elevation (NSTEMI) myocardial infarction: Secondary | ICD-10-CM

## 2014-08-05 DIAGNOSIS — R5383 Other fatigue: Secondary | ICD-10-CM | POA: Diagnosis not present

## 2014-08-05 DIAGNOSIS — M069 Rheumatoid arthritis, unspecified: Secondary | ICD-10-CM | POA: Diagnosis not present

## 2014-08-05 DIAGNOSIS — E785 Hyperlipidemia, unspecified: Secondary | ICD-10-CM

## 2014-08-05 NOTE — Progress Notes (Signed)
Patient ID: Richard Simpson, male   DOB: [birth year], 44 y.o.   MRN: [id number]     HPI: Richard Simpson is a 44 y.o. male who presents to the office today for a  cardiology evaluation in follow-up of his recent hospitalization and percutaneous coronary intervention.  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 He developed severe chest pain the evening of July 04, 2014 leading to his [facility] hospitalization on the morning of June 10.  He was seen in consultation by Dr. Harl Bowie and transferred for urgent cardiac catheterization which was done by me. This revealed a subtotal 99+ percent ostial LAD stenosis followed by proximal 70% and 50% stenoses.  Initial ejection fraction was 45% with anterolateral hypocontractility.  He underwent successful intervention with insertion of ascitic G DES 3.520 mm stent placed from the LAD ostium to cover all 3 lesions.  Subsequently, he noticed marked benefit in his previous exertional shortness of breath symptomatology.  He was seen  In follow-up by Leroy Sea, NP and due to and ACE-induced cough.  He was started on losartan in place of lisinopril. Patient has a history of arthritic disease and in the past had been treated for probable psoriatic arthritis but there is now some question that effect he may have rheumatoid arthritis. His  Otology physician, Dr. Ouida Sills , is leaving his practice and he was to be seen by Dr. Amil Amen who was leaving [facility] medical to start his own practice.   [name] denies recurrent chest pain symptoms. He is unaware of palpitations. He denies excessive bleeding.  He has been maintained on amlodipine 5 mg , atorvastatin 80 mg, aspirin 81 mg, Brilinta 90 mg twice a day, Lopressor 25 g twice a day, and losartan 25 mg daily. He will be initiating cardiac rehabilitation [facility] and will be going for orientation this week.   Past  Medical History  Diagnosis Date  . CAD (coronary artery disease)     a. 10/2013 Cath: mod LAD dzs->nl FFR-> medically managed;  b. 06/2014 NSTEMI/PCi: LAD 99ost, 70p, 51m(entire area covered with a 3.5x20 Synergy DES), LCX nl, RCA 239m10d, EF 45-50%.  . Headache(784.0)   . RLS (restless legs syndrome)   . Primary snoring   . Obesity   . Hypertension   . Dyslipidemia   . Rheumatoid arthritis   . Ischemic cardiomyopathy     a. 06/2014 EF 45-50% by LV gram.    Past Surgical History  Procedure Laterality Date  . Gallbladder surgery    . Throat surgery      nodules removed from throat as child  . Cholecystectomy      10 yrs ago-[facility]  . Mass excision  09/09/2010    Procedure: EXCISION MASS;  Surgeon: StArther AbbottMD;  Location: [facility] ORS;  Service: Orthopedics;  Laterality: Left;  Excision Mass Left Long Finger  . Colonoscopy N/A 03/23/2013    Procedure: COLONOSCOPY;  Surgeon: RoDaneil DolinMD;  Location: [facility] ENDO SUITE;  Service: Endoscopy;  Laterality: N/A;  2:15-moved to 1030 Staff notified pt  . Left heart catheterization with coronary angiogram N/A 11/23/2013    Procedure: LEFT HEART CATHETERIZATION WITH CORONARY ANGIOGRAM;  Surgeon: JaJettie BoozeMD;  [facility];  Service: Cardiovascular;  Laterality: N/A;  . Fractional flow reserve wire  11/23/2013    Procedure: FRACTIONAL FLOW RESERVE WIRE;  Surgeon: JaJettie Booze  MD;  Location: [place] CATH LAB;  Service: Cardiovascular;;  . Cardiac catheterization N/A 07/05/2014    Procedure: Left Heart Cath and Coronary Angiography;  Surgeon: Troy Sine, MD;  Location: [place] CV LAB;  Service: Cardiovascular;  Laterality: N/A;  . Cardiac catheterization  07/05/2014    Procedure: Coronary Stent Intervention;  Surgeon: Troy Sine, MD;  Location: [place] CV LAB;  Service: Cardiovascular;;    Allergies  Allergen Reactions  . Lisinopril Cough  . Prednisone Other (See Comments)    Makes angry    Current  Outpatient Prescriptions  Medication Sig Dispense Refill  . amLODipine (NORVASC) 5 MG tablet Take 1 tablet (5 mg total) by mouth daily. 30 tablet 6  . aspirin EC 81 MG tablet Take 1 tablet (81 mg total) by mouth daily. 30 tablet 3  . atorvastatin (LIPITOR) 80 MG tablet Take 1 tablet (80 mg total) by mouth daily at 6 PM. 30 tablet 6  . clonazePAM (KLONOPIN) 0.5 MG tablet Take 0.5 mg by mouth 2 (two) times daily as needed for anxiety.    Marland Kitchen loratadine (CLARITIN) 10 MG tablet Take 10 mg by mouth daily.     Marland Kitchen losartan (COZAAR) 25 MG tablet Take 1 tablet (25 mg total) by mouth daily. 90 tablet 3  . metoprolol tartrate (LOPRESSOR) 25 MG tablet Take 1 tablet (25 mg total) by mouth 2 (two) times daily. 60 tablet 6  . nitroGLYCERIN (NITROSTAT) 0.4 MG SL tablet Place 1 tablet (0.4 mg total) under the tongue every 5 (five) minutes as needed for chest pain. 25 tablet 3  . ranitidine (ZANTAC) 150 MG capsule Take 150 mg by mouth 2 (two) times daily.    . ticagrelor (BRILINTA) 90 MG TABS tablet Take 1 tablet (90 mg total) by mouth 2 (two) times daily. 60 tablet 6   No current facility-administered medications for this visit.    History   Social History  . Marital Status: Married    Spouse Name: Richard Simpson  . Number of Children: 2  . Years of Education: 12th grade   Occupational History  . Engineer, structural   .     Social History Main Topics  . Smoking status: Former Smoker -- 0.25 packs/day    Types: Cigarettes    Quit date: 09/04/2003  . Smokeless tobacco: Current User    Types: Chew  . Alcohol Use: No     Comment: rare  . Drug Use: No  . Sexual Activity: Yes   Other Topics Concern  . Not on file   Social History Narrative   Patient lives at home with his wife Richard Simpson).    Patient works full time Dentist..   Education high school.   Right handed.   Caffeine one cup of coffee daily and one soda.    Family History  Problem Relation Age of Onset  . Arthritis Paternal Uncle   .  Cancer Maternal Grandfather   . Anesthesia problems Neg Hx   . Hypotension Neg Hx   . Malignant hyperthermia Neg Hx   . Pseudochol deficiency Neg Hx   . Heart attack Neg Hx   . Stroke Neg Hx   . Hypertension Mother   . Hypertension Maternal Grandmother   . Diabetes    . Diabetes Mother   . Diabetes Maternal Grandmother   . Diabetes    . Diabetes Paternal Grandfather     ROS General: Negative; No fevers, chills, or night sweats HEENT: Negative; No  changes in vision or hearing, sinus congestion, difficulty swallowing Pulmonary: Negative; No cough, wheezing, shortness of breath, hemoptysis Cardiovascular: See HPI: No chest pain, presyncope, syncope, palpatations GI: Negative; No nausea, vomiting, diarrhea, or abdominal pain GU: Negative; No dysuria, hematuria, or difficulty voiding Musculoskeletal: Negative; no myalgias, joint pain, or weakness Hematologic: Negative; no easy bruising, bleeding Endocrine: Negative; no heat/cold intolerance; no diabetes, Neuro: Negative; no changes in balance, headaches Skin: Negative; No rashes or skin lesions Psychiatric: Negative; No behavioral problems, depression Sleep: Negative; No snoring,  daytime sleepiness, hypersomnolence, bruxism, restless legs, hypnogognic hallucinations. Other comprehensive 14 point system review is negative   Physical Exam BP 122/86 mmHg  Pulse 65  Ht '6\' 1"'  (1.854 m)  Wt 255 lb 4.8 oz (115.803 kg)  BMI 33.69 kg/m2 Wt Readings from Last 3 Encounters:  08/05/14 255 lb 4.8 oz (115.803 kg)  07/12/14 250 lb 12.8 oz (113.762 kg)  07/10/14 252 lb 12.8 oz (114.669 kg)   General: Alert, oriented, no distress.  Skin: normal turgor, no rashes, warm and dry HEENT: Normocephalic, atraumatic. Pupils equal round and reactive to light; sclera anicteric; extraocular muscles intact, No lid lag; Nose without nasal septal hypertrophy; Mouth/Parynx benign; Mallinpatti scale 3 Neck:  Thick neck;No JVD, no carotid bruits; normal  carotid upstroke Lungs: clear to ausculatation and percussion bilaterally; no wheezing or rales, normal inspiratory and expiratory effort Chest wall: without tenderness to palpitation Heart: PMI not displaced, RRR, s1 s2 normal,  Faint 1/6 systolic murmur, No diastolic murmur, no rubs, gallops, thrills, or heaves Abdomen: soft, nontender; no hepatosplenomehaly, BS+; abdominal aorta nontender and not dilated by palpation. Back: no CVA tenderness Pulses: 2+  Musculoskeletal: full range of motion, normal strength, no joint deformities Extremities: Pulses 2+, no clubbing cyanosis or edema, Homan's sign negative  Neurologic: grossly nonfocal; Cranial nerves grossly wnl Psychologic: Normal mood and affect   ECG (independently read by me):  Normal sinus rhythm at 65 bpm.  Nonspecific T-wave abnormality.  LABS:  BMP Latest Ref Rng 07/06/2014 07/04/2014 11/30/2013  Glucose 65 - 99 mg/dL 95 123(H) 89  BUN 6 - 20 mg/dL '9 18 18  ' Creatinine 0.61 - 1.24 mg/dL 0.93 0.95 1.1  Sodium 135 - 145 mmol/L 138 139 139  Potassium 3.5 - 5.1 mmol/L 3.7 3.5 4.1  Chloride 101 - 111 mmol/L 105 107 106  CO2 22 - 32 mmol/L '22 23 28  ' Calcium 8.9 - 10.3 mg/dL 8.9 9.1 9.2     Hepatic Function Latest Ref Rng 07/04/2014 09/23/2013 03/20/2013  Total Protein 6.5 - 8.1 g/dL 6.8 6.9 7.4  Albumin 3.5 - 5.0 g/dL 4.2 3.9 4.1  AST 15 - 41 U/L 35 30 32  ALT 17 - 63 U/L 51 46 58(H)  Alk Phosphatase 38 - 126 U/L 55 70 74  Total Bilirubin 0.3 - 1.2 mg/dL 1.0 0.6 0.9  Bilirubin, Direct 0.0 - 0.3 mg/dL - <0.2 -    CBC Latest Ref Rng 07/07/2014 07/06/2014 07/05/2014  WBC 4.0 - 10.5 K/uL 6.6 6.7 5.0  Hemoglobin 13.0 - 17.0 g/dL 15.0 14.4 14.8  Hematocrit 39.0 - 52.0 % 42.4 40.7 43.4  Platelets 150 - 400 K/uL 166 170 174   Lab Results  Component Value Date   MCV 90.8 07/07/2014   MCV 90.8 07/06/2014   MCV 93.5 07/05/2014    Lab Results  Component Value Date   TSH 1.580 09/23/2013    BNP No results found for:  BNP  ProBNP    Component Value Date/Time  PROBNP 10.0 12/18/2013 1318     Lipid Panel  No results found for: CHOL, TRIG, HDL, CHOLHDL, VLDL, LDLCALC, LDLDIRECT   RADIOLOGY: No results found.    ASSESSMENT AND PLAN:  Mr. 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 He has been noted have groundglass attenuation opacities on CT scan of his lungs.  He presented with an acute coronary syndrome and ruled in for non-ST segment elevation MI with a peak troponin of 18.22 on 07/05/2014 and underwent successful PCI involving a subtotal greater than 99% ostial LAD stenosis with ultimate insertion of a 3.520 mm [employer]'s Synergy DES stent which covered 3 LAD lesions. His ECG today does not show any evidence for his prior MI.  I suspect he will have significant salvage of myocardium.  His previous exertional dyspnea has significant improved.  He does admit to fatigue. His blood pressure today is stable without orthostatic symptomatology on his current medical regimen. Is tolerating dual antiplatelets therapy with aspirin and Brilinta without bleeding. I am scheduling him for an echo Doppler study to reassess LV function and assess pulmonary pressures.  We discussed follow-up rheumatologic evaluation since Dr. Ouida Sills, is retiring from his practice.  I will check a complete set of laboratory on his current medical regimen and will also obtain an erythrocyte sedimentation rate. I will see him in 3 months for follow-up evaluation.   time spent: 30 minutes  Troy Sine, MD, [facility]  08/05/2014 6:14 PM

## 2014-08-05 NOTE — Patient Instructions (Signed)
Your physician has requested that you have an echocardiogram. Echocardiography is a painless test that uses sound waves to create images of your heart. It provides your doctor with information about the size and shape of your heart and how well your heart's chambers and valves are working. This procedure takes approximately one hour. There are no restrictions for this procedure.  Your physician recommends that you return for lab work fasting.  Your physician recommends that you schedule a follow-up appointment in: 3 months with Dr. Claiborne Billings.

## 2014-08-06 ENCOUNTER — Encounter (HOSPITAL_COMMUNITY)
Admission: RE | Admit: 2014-08-06 | Discharge: 2014-08-06 | Disposition: A | Discharge status: Home / Self Care | Payer: PRIVATE HEALTH INSURANCE | Source: Ambulatory Visit | Attending: Cardiovascular Disease | Admitting: Cardiovascular Disease

## 2014-08-06 ENCOUNTER — Other Ambulatory Visit: Payer: Self-pay | Admitting: Cardiovascular Disease

## 2014-08-06 ENCOUNTER — Encounter (HOSPITAL_COMMUNITY): Payer: Self-pay

## 2014-08-06 VITALS — BP 110/80 | HR 61 | Ht 73.0 in | Wt 255.0 lb

## 2014-08-06 DIAGNOSIS — Z9861 Coronary angioplasty status: Secondary | ICD-10-CM | POA: Diagnosis not present

## 2014-08-06 DIAGNOSIS — Z955 Presence of coronary angioplasty implant and graft: Secondary | ICD-10-CM

## 2014-08-06 DIAGNOSIS — I252 Old myocardial infarction: Secondary | ICD-10-CM | POA: Diagnosis present

## 2014-08-06 NOTE — Patient Instructions (Signed)
Pt has finished orientation and is scheduled to start CR on August 12, 2014 at 0815. Pt has been instructed to arrive to class 15 minutes early for scheduled class. Pt has been instructed to wear comfortable clothing and shoes with rubber soles. Pt has been told to take their medications 1 hour prior to coming to class.  If the patient is not going to attend class, he has been instructed to call.

## 2014-08-06 NOTE — Progress Notes (Signed)
Patient arrived at 0800. Patient referred to Cardiac Rehab by Dr. Claiborne Billings due to Stent/MI (Z95.5/I21.4)  Dr. Claiborne Billings is his cardiologist and Dr.Golding is his PCP.  During orientation advised patient on arrival and appointment times what to wear, what to do before, during and after exercise.  Reviewed attendance and class policy.  Talked about inclement weather and class consultation policy. Patient is scheduled to start cardiac Rehab on August 12, 2014 at 0815.  Patient was advised to come to class 5 minutes before class starts.  He was also given instructions on meeting with the dietician and attending the Family Structure classes. Pt is eager to get started.  Patient finished pre walk test.  Orientation and education ended at 0920.

## 2014-08-06 NOTE — Progress Notes (Signed)
Cardiac/Pulmonary Rehab Medication Review by a Pharmacist  Does the patient  feel that his/her medications are working for him/her?  yes  Has the patient been experiencing any side effects to the medications prescribed?  no  Does the patient measure his/her own blood pressure or blood glucose at home?  no   Does the patient have any problems obtaining medications due to transportation or finances?   no  Understanding of regimen: excellent Understanding of indications: excellent Potential of compliance: Fair  Pharmacist comments: Good understanding of medications.  Compliance with regimen noted, pill container to help with compliance.  Has missed a few doses but better now.  Pricilla Larsson 08/06/2014 8:47 AM

## 2014-08-07 LAB — LIPID PANEL W/O CHOL/HDL RATIO
Cholesterol, Total: 114 mg/dL (ref 100–199)
HDL: 28 mg/dL — ABNORMAL LOW (ref >39)
LDL CALC: 63 mg/dL (ref 0–99)
Triglycerides: 116 mg/dL (ref 0–149)
VLDL Cholesterol Cal: 23 mg/dL (ref 5–40)

## 2014-08-07 LAB — COMPREHENSIVE METABOLIC PANEL
ALK PHOS: 82 IU/L (ref 39–117)
ALT: 51 IU/L — ABNORMAL HIGH (ref 0–44)
AST: 36 IU/L (ref 0–40)
Albumin/Globulin Ratio: 1.8 (ref 1.1–2.5)
Albumin: 4.3 g/dL (ref 3.5–5.5)
BILIRUBIN TOTAL: 0.9 mg/dL (ref 0.0–1.2)
BUN/Creatinine Ratio: 15 (ref 9–20)
BUN: 14 mg/dL (ref 6–24)
CO2: 24 mmol/L (ref 18–29)
Calcium: 9.2 mg/dL (ref 8.7–10.2)
Chloride: 102 mmol/L (ref 97–108)
Creatinine, Ser: 0.93 mg/dL (ref 0.76–1.27)
GFR calc Af Amer: 116 mL/min/{1.73_m2} (ref >59)
GFR, EST NON AFRICAN AMERICAN: 100 mL/min/{1.73_m2} (ref >59)
Globulin, Total: 2.4 g/dL (ref 1.5–4.5)
Glucose: 91 mg/dL (ref 65–99)
POTASSIUM: 4.3 mmol/L (ref 3.5–5.2)
Sodium: 143 mmol/L (ref 134–144)
Total Protein: 6.7 g/dL (ref 6.0–8.5)

## 2014-08-07 LAB — CBC WITH DIFFERENTIAL/PLATELET
Basophils Absolute: 0 10*3/uL (ref 0.0–0.2)
Basos: 0 %
EOS (ABSOLUTE): 0.1 10*3/uL (ref 0.0–0.4)
Eos: 2 %
HEMATOCRIT: 46.1 % (ref 37.5–51.0)
HEMOGLOBIN: 15.8 g/dL (ref 12.6–17.7)
IMMATURE GRANULOCYTES: 0 %
Immature Grans (Abs): 0 10*3/uL (ref 0.0–0.1)
LYMPHS ABS: 1.2 10*3/uL (ref 0.7–3.1)
LYMPHS: 25 %
MCH: 32.3 pg (ref 26.6–33.0)
MCHC: 34.3 g/dL (ref 31.5–35.7)
MCV: 94 fL (ref 79–97)
MONOS ABS: 0.5 10*3/uL (ref 0.1–0.9)
Monocytes: 10 %
NEUTROS ABS: 3.1 10*3/uL (ref 1.4–7.0)
Neutrophils: 63 %
Platelets: 168 10*3/uL (ref 150–379)
RBC: 4.89 x10E6/uL (ref 4.14–5.80)
RDW: 12.8 % (ref 12.3–15.4)
WBC: 4.9 10*3/uL (ref 3.4–10.8)

## 2014-08-07 LAB — TSH: TSH: 1.48 u[IU]/mL (ref 0.450–4.500)

## 2014-08-07 LAB — SEDIMENTATION RATE: Sed Rate: 2 mm/hr (ref 0–15)

## 2014-08-09 ENCOUNTER — Ambulatory Visit (HOSPITAL_COMMUNITY)
Admission: RE | Admit: 2014-08-09 | Discharge: 2014-08-09 | Disposition: A | Discharge status: Home / Self Care | Payer: PRIVATE HEALTH INSURANCE | Source: Ambulatory Visit | Attending: Cardiology | Admitting: Cardiology

## 2014-08-09 DIAGNOSIS — I25810 Atherosclerosis of coronary artery bypass graft(s) without angina pectoris: Secondary | ICD-10-CM

## 2014-08-09 DIAGNOSIS — I351 Nonrheumatic aortic (valve) insufficiency: Secondary | ICD-10-CM | POA: Diagnosis not present

## 2014-08-09 DIAGNOSIS — I10 Essential (primary) hypertension: Secondary | ICD-10-CM

## 2014-08-09 DIAGNOSIS — I059 Rheumatic mitral valve disease, unspecified: Secondary | ICD-10-CM | POA: Diagnosis not present

## 2014-08-09 DIAGNOSIS — I2510 Atherosclerotic heart disease of native coronary artery without angina pectoris: Secondary | ICD-10-CM | POA: Diagnosis present

## 2014-08-12 ENCOUNTER — Encounter (HOSPITAL_COMMUNITY)
Admission: RE | Admit: 2014-08-12 | Discharge: 2014-08-12 | Disposition: A | Discharge status: Home / Self Care | Payer: PRIVATE HEALTH INSURANCE | Source: Ambulatory Visit | Attending: Cardiovascular Disease | Admitting: Cardiovascular Disease

## 2014-08-12 DIAGNOSIS — I252 Old myocardial infarction: Secondary | ICD-10-CM | POA: Diagnosis not present

## 2014-08-14 ENCOUNTER — Encounter (HOSPITAL_COMMUNITY)
Admission: RE | Admit: 2014-08-14 | Discharge: 2014-08-14 | Disposition: A | Discharge status: Home / Self Care | Payer: PRIVATE HEALTH INSURANCE | Source: Ambulatory Visit | Attending: Cardiovascular Disease | Admitting: Cardiovascular Disease

## 2014-08-14 DIAGNOSIS — I252 Old myocardial infarction: Secondary | ICD-10-CM | POA: Diagnosis not present

## 2014-08-14 NOTE — Progress Notes (Signed)
Cardiac Rehabilitation Program Outcomes Report   Orientation:  08/06/14 Graduate Date:  tbd Discharge Date:  tbd # of sessions completed: 3  Cardiologist: Liston Alba MD:  Bethann Berkshire Time:  0815  A.  Exercise Program:  Tolerates exercise @ 3.73 METS for 15 minutes and Walk Test Results:  Pre: 3.03 mets  B.  Mental Health:  Good mental attitude  C.  Education/Instruction/Skills  Accurately checks own pulse.  Rest:  72  Exercise:  84  Uses Perceived Exertion Scale and/or Dyspnea Scale  D.  Nutrition/Weight Control/Body Composition:  Adherence to prescribed nutrition program: fair    E.  Blood Lipids    Lab Results  Component Value Date   CHOL 114 08/06/2014   HDL 28* 08/06/2014   LDLCALC 63 08/06/2014   TRIG 116 08/06/2014    F.  Lifestyle Changes:  Making positive lifestyle changes and Not smoking:  Quit 2001  G.  Symptoms noted with exercise:  Asymptomatic  Report Completed By:  Stevphen Rochester RN   Comments:  This is patients first week progress note for [facility].

## 2014-08-16 ENCOUNTER — Encounter (HOSPITAL_COMMUNITY)
Admission: RE | Admit: 2014-08-16 | Discharge: 2014-08-16 | Disposition: A | Discharge status: Home / Self Care | Payer: PRIVATE HEALTH INSURANCE | Source: Ambulatory Visit | Attending: Cardiovascular Disease | Admitting: Cardiovascular Disease

## 2014-08-16 DIAGNOSIS — I252 Old myocardial infarction: Secondary | ICD-10-CM | POA: Diagnosis not present

## 2014-08-19 ENCOUNTER — Encounter (HOSPITAL_COMMUNITY)
Admission: RE | Admit: 2014-08-19 | Discharge: 2014-08-19 | Disposition: A | Discharge status: Home / Self Care | Payer: PRIVATE HEALTH INSURANCE | Source: Ambulatory Visit | Attending: Cardiovascular Disease | Admitting: Cardiovascular Disease

## 2014-08-19 DIAGNOSIS — I252 Old myocardial infarction: Secondary | ICD-10-CM | POA: Diagnosis not present

## 2014-08-20 NOTE — Addendum Note (Signed)
Addended by: Alma Downs on: 08/20/2014 03:15 PM   Modules accepted: Orders

## 2014-08-21 ENCOUNTER — Encounter (HOSPITAL_COMMUNITY)
Admission: RE | Admit: 2014-08-21 | Discharge: 2014-08-21 | Disposition: A | Discharge status: Home / Self Care | Payer: PRIVATE HEALTH INSURANCE | Source: Ambulatory Visit | Attending: Cardiovascular Disease | Admitting: Cardiovascular Disease

## 2014-08-21 DIAGNOSIS — I252 Old myocardial infarction: Secondary | ICD-10-CM | POA: Diagnosis not present

## 2014-08-23 ENCOUNTER — Encounter (HOSPITAL_COMMUNITY): Payer: PRIVATE HEALTH INSURANCE

## 2014-08-26 ENCOUNTER — Encounter (HOSPITAL_COMMUNITY)
Admission: RE | Admit: 2014-08-26 | Discharge: 2014-08-26 | Disposition: A | Discharge status: Home / Self Care | Payer: PRIVATE HEALTH INSURANCE | Source: Ambulatory Visit | Attending: Cardiovascular Disease | Admitting: Cardiovascular Disease

## 2014-08-26 DIAGNOSIS — I252 Old myocardial infarction: Secondary | ICD-10-CM | POA: Insufficient documentation

## 2014-08-26 DIAGNOSIS — Z9861 Coronary angioplasty status: Secondary | ICD-10-CM | POA: Insufficient documentation

## 2014-08-28 ENCOUNTER — Encounter (HOSPITAL_COMMUNITY): Payer: PRIVATE HEALTH INSURANCE

## 2014-08-29 ENCOUNTER — Ambulatory Visit: Payer: PRIVATE HEALTH INSURANCE | Admitting: Cardiovascular Disease

## 2014-08-30 ENCOUNTER — Encounter (HOSPITAL_COMMUNITY)
Admission: RE | Admit: 2014-08-30 | Discharge: 2014-08-30 | Disposition: A | Discharge status: Home / Self Care | Payer: PRIVATE HEALTH INSURANCE | Source: Ambulatory Visit | Attending: Cardiovascular Disease | Admitting: Cardiovascular Disease

## 2014-08-30 DIAGNOSIS — I252 Old myocardial infarction: Secondary | ICD-10-CM | POA: Diagnosis not present

## 2014-08-30 NOTE — Progress Notes (Signed)
Patient was given individual home exercise plan today, 08/30/2014. Handout was reviewed and discussed. Patient verbalized an understanding.

## 2014-09-02 ENCOUNTER — Encounter (HOSPITAL_COMMUNITY)
Admission: RE | Admit: 2014-09-02 | Discharge: 2014-09-02 | Disposition: A | Discharge status: Home / Self Care | Payer: PRIVATE HEALTH INSURANCE | Source: Ambulatory Visit | Attending: Cardiovascular Disease | Admitting: Cardiovascular Disease

## 2014-09-02 DIAGNOSIS — I252 Old myocardial infarction: Secondary | ICD-10-CM | POA: Diagnosis not present

## 2014-09-04 ENCOUNTER — Encounter (HOSPITAL_COMMUNITY): Payer: PRIVATE HEALTH INSURANCE

## 2014-09-06 ENCOUNTER — Encounter (HOSPITAL_COMMUNITY)
Admission: RE | Admit: 2014-09-06 | Discharge: 2014-09-06 | Disposition: A | Discharge status: Home / Self Care | Payer: PRIVATE HEALTH INSURANCE | Source: Ambulatory Visit | Attending: Cardiovascular Disease | Admitting: Cardiovascular Disease

## 2014-09-06 DIAGNOSIS — I252 Old myocardial infarction: Secondary | ICD-10-CM | POA: Diagnosis not present

## 2014-09-09 ENCOUNTER — Encounter (HOSPITAL_COMMUNITY): Payer: PRIVATE HEALTH INSURANCE

## 2014-09-11 ENCOUNTER — Encounter (HOSPITAL_COMMUNITY)
Admission: RE | Admit: 2014-09-11 | Discharge: 2014-09-11 | Disposition: A | Discharge status: Home / Self Care | Payer: PRIVATE HEALTH INSURANCE | Source: Ambulatory Visit | Attending: Cardiovascular Disease | Admitting: Cardiovascular Disease

## 2014-09-11 DIAGNOSIS — I252 Old myocardial infarction: Secondary | ICD-10-CM | POA: Diagnosis not present

## 2014-09-13 ENCOUNTER — Encounter (HOSPITAL_COMMUNITY)
Admission: RE | Admit: 2014-09-13 | Discharge: 2014-09-13 | Disposition: A | Discharge status: Home / Self Care | Payer: PRIVATE HEALTH INSURANCE | Source: Ambulatory Visit | Attending: Cardiovascular Disease | Admitting: Cardiovascular Disease

## 2014-09-13 DIAGNOSIS — I252 Old myocardial infarction: Secondary | ICD-10-CM | POA: Diagnosis not present

## 2014-09-16 ENCOUNTER — Encounter (HOSPITAL_COMMUNITY)
Admission: RE | Admit: 2014-09-16 | Discharge: 2014-09-16 | Disposition: A | Discharge status: Home / Self Care | Payer: PRIVATE HEALTH INSURANCE | Source: Ambulatory Visit | Attending: Cardiovascular Disease | Admitting: Cardiovascular Disease

## 2014-09-16 DIAGNOSIS — I252 Old myocardial infarction: Secondary | ICD-10-CM | POA: Diagnosis not present

## 2014-09-18 ENCOUNTER — Encounter (HOSPITAL_COMMUNITY): Payer: PRIVATE HEALTH INSURANCE

## 2014-09-20 ENCOUNTER — Encounter (HOSPITAL_COMMUNITY)
Admission: RE | Admit: 2014-09-20 | Discharge: 2014-09-20 | Disposition: A | Discharge status: Home / Self Care | Payer: PRIVATE HEALTH INSURANCE | Source: Ambulatory Visit | Attending: Cardiovascular Disease | Admitting: Cardiovascular Disease

## 2014-09-20 DIAGNOSIS — I252 Old myocardial infarction: Secondary | ICD-10-CM | POA: Diagnosis not present

## 2014-09-23 ENCOUNTER — Encounter (HOSPITAL_COMMUNITY)
Admission: RE | Admit: 2014-09-23 | Discharge: 2014-09-23 | Disposition: A | Discharge status: Home / Self Care | Payer: PRIVATE HEALTH INSURANCE | Source: Ambulatory Visit | Attending: Cardiovascular Disease | Admitting: Cardiovascular Disease

## 2014-09-23 DIAGNOSIS — I252 Old myocardial infarction: Secondary | ICD-10-CM | POA: Diagnosis not present

## 2014-09-25 ENCOUNTER — Encounter (HOSPITAL_COMMUNITY)
Admission: RE | Admit: 2014-09-25 | Discharge: 2014-09-25 | Disposition: A | Discharge status: Home / Self Care | Payer: PRIVATE HEALTH INSURANCE | Source: Ambulatory Visit | Attending: Cardiovascular Disease | Admitting: Cardiovascular Disease

## 2014-09-25 DIAGNOSIS — I252 Old myocardial infarction: Secondary | ICD-10-CM | POA: Diagnosis not present

## 2014-09-27 ENCOUNTER — Encounter (HOSPITAL_COMMUNITY): Payer: PRIVATE HEALTH INSURANCE

## 2014-09-30 ENCOUNTER — Encounter (HOSPITAL_COMMUNITY): Payer: PRIVATE HEALTH INSURANCE

## 2014-10-02 ENCOUNTER — Encounter (HOSPITAL_COMMUNITY)
Admission: RE | Admit: 2014-10-02 | Discharge: 2014-10-02 | Disposition: A | Discharge status: Home / Self Care | Payer: PRIVATE HEALTH INSURANCE | Source: Ambulatory Visit | Attending: Cardiovascular Disease | Admitting: Cardiovascular Disease

## 2014-10-02 DIAGNOSIS — Z9861 Coronary angioplasty status: Secondary | ICD-10-CM | POA: Insufficient documentation

## 2014-10-02 DIAGNOSIS — I252 Old myocardial infarction: Secondary | ICD-10-CM | POA: Insufficient documentation

## 2014-10-04 ENCOUNTER — Encounter (HOSPITAL_COMMUNITY)
Admission: RE | Admit: 2014-10-04 | Discharge: 2014-10-04 | Disposition: A | Discharge status: Home / Self Care | Payer: PRIVATE HEALTH INSURANCE | Source: Ambulatory Visit | Attending: Cardiovascular Disease | Admitting: Cardiovascular Disease

## 2014-10-04 DIAGNOSIS — I252 Old myocardial infarction: Secondary | ICD-10-CM | POA: Diagnosis not present

## 2014-10-04 NOTE — Progress Notes (Signed)
Cardiac Rehabilitation Program Outcomes Report   Orientation:  08/06/14 Graduate Date:  tbd Discharge Date:  tbd # of sessions completed: 18  Cardiologist: Randall An MD:  Bethann Berkshire Time:  0867  A.  Exercise Program:  Tolerates exercise @ 3.72 METS for 15 minutes  B.  Mental Health:  Good mental attitude  C.  Education/Instruction/Skills  Accurately checks own pulse.  Rest:  90  Exercise:  105  Uses Perceived Exertion Scale and/or Dyspnea Scale  D.  Nutrition/Weight Control/Body Composition:  Adherence to prescribed nutrition program: good    E.  Blood Lipids    Lab Results  Component Value Date   CHOL 114 08/06/2014   HDL 28* 08/06/2014   LDLCALC 63 08/06/2014   TRIG 116 08/06/2014    F.  Lifestyle Changes:  Making positive lifestyle changes  G.  Symptoms noted with exercise:  Asymptomatic  Report Completed By:  Stevphen Rochester RN   Comments:  This is the patients halfway progress note for [facility]. Patient is doing well in the program.

## 2014-10-07 ENCOUNTER — Encounter (HOSPITAL_COMMUNITY)
Admission: RE | Admit: 2014-10-07 | Discharge: 2014-10-07 | Disposition: A | Discharge status: Home / Self Care | Payer: PRIVATE HEALTH INSURANCE | Source: Ambulatory Visit | Attending: Cardiovascular Disease | Admitting: Cardiovascular Disease

## 2014-10-07 DIAGNOSIS — I252 Old myocardial infarction: Secondary | ICD-10-CM | POA: Diagnosis not present

## 2014-10-09 ENCOUNTER — Encounter (HOSPITAL_COMMUNITY)
Admission: RE | Admit: 2014-10-09 | Discharge: 2014-10-09 | Disposition: A | Discharge status: Home / Self Care | Payer: PRIVATE HEALTH INSURANCE | Source: Ambulatory Visit | Attending: Cardiovascular Disease | Admitting: Cardiovascular Disease

## 2014-10-09 DIAGNOSIS — I252 Old myocardial infarction: Secondary | ICD-10-CM | POA: Diagnosis not present

## 2014-10-11 ENCOUNTER — Ambulatory Visit (HOSPITAL_COMMUNITY)
Admission: RE | Admit: 2014-10-11 | Discharge: 2014-10-11 | Disposition: A | Discharge status: Home / Self Care | Payer: PRIVATE HEALTH INSURANCE | Source: Ambulatory Visit | Attending: Pulmonary Disease | Admitting: Pulmonary Disease

## 2014-10-11 ENCOUNTER — Encounter (HOSPITAL_COMMUNITY): Payer: PRIVATE HEALTH INSURANCE

## 2014-10-11 DIAGNOSIS — R9389 Abnormal findings on diagnostic imaging of other specified body structures: Secondary | ICD-10-CM

## 2014-10-11 DIAGNOSIS — R918 Other nonspecific abnormal finding of lung field: Secondary | ICD-10-CM | POA: Diagnosis present

## 2014-10-14 ENCOUNTER — Encounter (HOSPITAL_COMMUNITY)
Admission: RE | Admit: 2014-10-14 | Discharge: 2014-10-14 | Disposition: A | Discharge status: Home / Self Care | Payer: PRIVATE HEALTH INSURANCE | Source: Ambulatory Visit | Attending: Cardiovascular Disease | Admitting: Cardiovascular Disease

## 2014-10-14 DIAGNOSIS — I252 Old myocardial infarction: Secondary | ICD-10-CM | POA: Diagnosis not present

## 2014-10-16 ENCOUNTER — Encounter (HOSPITAL_COMMUNITY)
Admission: RE | Admit: 2014-10-16 | Discharge: 2014-10-16 | Disposition: A | Discharge status: Home / Self Care | Payer: PRIVATE HEALTH INSURANCE | Source: Ambulatory Visit | Attending: Cardiovascular Disease | Admitting: Cardiovascular Disease

## 2014-10-16 DIAGNOSIS — I252 Old myocardial infarction: Secondary | ICD-10-CM | POA: Diagnosis not present

## 2014-10-18 ENCOUNTER — Encounter: Payer: Self-pay | Admitting: Interventional Cardiology

## 2014-10-18 ENCOUNTER — Encounter (HOSPITAL_COMMUNITY)
Admission: RE | Admit: 2014-10-18 | Discharge: 2014-10-18 | Disposition: A | Discharge status: Home / Self Care | Payer: PRIVATE HEALTH INSURANCE | Source: Ambulatory Visit | Attending: Cardiovascular Disease | Admitting: Cardiovascular Disease

## 2014-10-18 DIAGNOSIS — I252 Old myocardial infarction: Secondary | ICD-10-CM | POA: Diagnosis not present

## 2014-10-21 ENCOUNTER — Encounter (HOSPITAL_COMMUNITY): Payer: PRIVATE HEALTH INSURANCE

## 2014-10-23 ENCOUNTER — Encounter (HOSPITAL_COMMUNITY)
Admission: RE | Admit: 2014-10-23 | Discharge: 2014-10-23 | Disposition: A | Discharge status: Home / Self Care | Payer: PRIVATE HEALTH INSURANCE | Source: Ambulatory Visit | Attending: Cardiovascular Disease | Admitting: Cardiovascular Disease

## 2014-10-23 DIAGNOSIS — I252 Old myocardial infarction: Secondary | ICD-10-CM | POA: Diagnosis not present

## 2014-10-25 ENCOUNTER — Encounter (HOSPITAL_COMMUNITY)
Admission: RE | Admit: 2014-10-25 | Discharge: 2014-10-25 | Disposition: A | Discharge status: Home / Self Care | Payer: PRIVATE HEALTH INSURANCE | Source: Ambulatory Visit | Attending: Cardiovascular Disease | Admitting: Cardiovascular Disease

## 2014-10-25 DIAGNOSIS — I252 Old myocardial infarction: Secondary | ICD-10-CM | POA: Diagnosis not present

## 2014-10-28 ENCOUNTER — Encounter (HOSPITAL_COMMUNITY)
Admission: RE | Admit: 2014-10-28 | Discharge: 2014-10-28 | Disposition: A | Discharge status: Home / Self Care | Payer: PRIVATE HEALTH INSURANCE | Source: Ambulatory Visit | Attending: Cardiovascular Disease | Admitting: Cardiovascular Disease

## 2014-10-28 DIAGNOSIS — I252 Old myocardial infarction: Secondary | ICD-10-CM | POA: Insufficient documentation

## 2014-10-28 DIAGNOSIS — Z9861 Coronary angioplasty status: Secondary | ICD-10-CM | POA: Diagnosis not present

## 2014-10-30 ENCOUNTER — Encounter (HOSPITAL_COMMUNITY)
Admission: RE | Admit: 2014-10-30 | Discharge: 2014-10-30 | Disposition: A | Discharge status: Home / Self Care | Payer: PRIVATE HEALTH INSURANCE | Source: Ambulatory Visit | Attending: Cardiovascular Disease | Admitting: Cardiovascular Disease

## 2014-10-30 DIAGNOSIS — I252 Old myocardial infarction: Secondary | ICD-10-CM | POA: Diagnosis not present

## 2014-11-01 ENCOUNTER — Encounter (HOSPITAL_COMMUNITY): Payer: PRIVATE HEALTH INSURANCE

## 2014-11-04 ENCOUNTER — Encounter (HOSPITAL_COMMUNITY)
Admission: RE | Admit: 2014-11-04 | Discharge: 2014-11-04 | Disposition: A | Discharge status: Home / Self Care | Payer: PRIVATE HEALTH INSURANCE | Source: Ambulatory Visit | Attending: Cardiovascular Disease | Admitting: Cardiovascular Disease

## 2014-11-04 DIAGNOSIS — I252 Old myocardial infarction: Secondary | ICD-10-CM | POA: Diagnosis not present

## 2014-11-06 ENCOUNTER — Encounter (HOSPITAL_COMMUNITY)
Admission: RE | Admit: 2014-11-06 | Discharge: 2014-11-06 | Disposition: A | Discharge status: Home / Self Care | Payer: PRIVATE HEALTH INSURANCE | Source: Ambulatory Visit | Attending: Cardiovascular Disease | Admitting: Cardiovascular Disease

## 2014-11-06 DIAGNOSIS — I252 Old myocardial infarction: Secondary | ICD-10-CM | POA: Diagnosis not present

## 2014-11-07 ENCOUNTER — Ambulatory Visit (INDEPENDENT_AMBULATORY_CARE_PROVIDER_SITE_OTHER): Payer: PRIVATE HEALTH INSURANCE | Admitting: Cardiovascular Disease

## 2014-11-07 ENCOUNTER — Encounter: Payer: Self-pay | Admitting: Cardiovascular Disease

## 2014-11-07 VITALS — BP 104/70 | HR 65 | Ht 73.0 in | Wt 245.0 lb

## 2014-11-07 DIAGNOSIS — I2583 Coronary atherosclerosis due to lipid rich plaque: Secondary | ICD-10-CM

## 2014-11-07 DIAGNOSIS — E669 Obesity, unspecified: Secondary | ICD-10-CM

## 2014-11-07 DIAGNOSIS — E785 Hyperlipidemia, unspecified: Secondary | ICD-10-CM | POA: Diagnosis not present

## 2014-11-07 DIAGNOSIS — I10 Essential (primary) hypertension: Secondary | ICD-10-CM

## 2014-11-07 DIAGNOSIS — I2510 Atherosclerotic heart disease of native coronary artery without angina pectoris: Secondary | ICD-10-CM

## 2014-11-07 NOTE — Patient Instructions (Signed)
Your physician wants you to follow-up in: 6 months or sooner if needed. You will receive a reminder letter in the mail two months in advance. If you don't receive a letter, please call our office to schedule the follow-up appointment. 

## 2014-11-07 NOTE — Progress Notes (Signed)
Patient ID: Richard Simpson, male   DOB: May 14, 1970, 44 y.o.   MRN: [id number]     HPI: Richard Simpson is a 44 y.o. male who presents to the office today for a 3 month follow-up cardiology evaluation.  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 He developed severe chest pain the evening of July 04, 2014 leading to his [facility] hospitalization on the morning of June 10.  He was seen in consultation by [facility] and transferred for urgent cardiac catheterization which was done by me. This revealed a subtotal 99+ percent ostial LAD stenosis followed by proximal 70% and 50% stenoses.  Initial ejection fraction was 45% with anterolateral hypocontractility.  He underwent successful intervention with insertion of Synergy DES 3.520 mm stent placed from the LAD ostium to cover all 3 lesions.  Subsequently, he noticed marked benefit in his previous exertional shortness of breath symptomatology.  He was seen  In follow-up by Richard Hodgkins, NP and due to and ACE-induced cough.  He was started on losartan in place of lisinopril. Patient has a history of arthritic disease and in the past had been treated for probable psoriatic arthritis but there is now some question that effect he may have rheumatoid arthritis.  Remotely he had seen [facility], who is retired.  He has established care with [facility].  Richard Simpson denies recurrent chest pain symptoms. He is unaware of palpitations. He denies excessive bleeding.  At times he does note some mild shortness of breath.  He admits that he is not shaped that he had been in the past.  He has been maintained on amlodipine 5 mg , atorvastatin 80 mg, aspirin 81 mg, Brilinta 90 mg twice a day, Lopressor 25 g twice a day, and losartan 25 mg daily.  He underwent an echo Doppler study on 08/09/2014 which showed mild focal basal hypertrophy of the septum.  There are no wall motion  abnormalities.  Ejection fraction was 60-65%.  There was grade 1 diastolic dysfunction.  The mitral valve was mildly calcified at its annulus and there was trivial aortic insufficiency.  He has been participating in cardiac rehabilitation at [facility].  He actually feels better with exercise.  During rehabilitation and denies specific exercise-induced chest pressure.  He presents for evaluation.   Past Medical History  Diagnosis Date  . CAD (coronary artery disease)     a. 10/2013 Cath: mod LAD dzs->nl FFR-> medically managed;  b. 06/2014 NSTEMI/PCi: LAD 99ost, 70p, 53m(entire area covered with a 3.5x20 Synergy DES), LCX nl, RCA 267m10d, EF 45-50%.  . Headache(784.0)   . RLS (restless legs syndrome)   . Primary snoring   . Obesity   . Hypertension   . Dyslipidemia   . [place]umatoid [place]  . Ischemic cardiomyopathy     a. 06/2014 EF 45-50% by LV gram.  . Acute [facility] ([facility]    Past Surgical History  Procedure Laterality Date  . Gallbladder surgery    . Throat surgery      nodules removed from throat as child  . Cholecystectomy      10 yrs ago-jenkins  . Mass excision  09/09/2010    Procedure: EXCISION MASS;  Surgeon: StArther AbbottMD;  Location: [facility];  Service: Orthopedics;  Laterality: Left;  Excision Mass Left Long Finger  . Colonoscopy N/A 03/23/2013    Procedure: COLONOSCOPY;  Surgeon: RoHerbie Baltimore  Hilton Cork, MD;  Location: [facility] SUITE;  Service: Endoscopy;  Laterality: N/A;  2:15-moved to 1030 Staff notified pt  . Left heart catheterization with coronary angiogram N/A 11/23/2013    Procedure: LEFT HEART CATHETERIZATION WITH CORONARY ANGIOGRAM;  Surgeon: Jettie Booze, MD;  Location: [facility] CATH LAB;  Service: Cardiovascular;  Laterality: N/A;  . Fractional flow reserve wire  11/23/2013    Procedure: FRACTIONAL FLOW RESERVE WIRE;  Surgeon: Jettie Booze, MD;  [facility];  Service: Cardiovascular;;  . Cardiac catheterization N/A 07/05/2014    Procedure: Left  Heart Cath and Coronary Angiography;  Surgeon: Troy Sine, MD;  Location: [place] CV LAB;  Service: Cardiovascular;  Laterality: N/A;  . Cardiac catheterization  07/05/2014    Procedure: Coronary Stent Intervention;  Surgeon: Troy Sine, MD;  Location: [place] CV LAB;  Service: Cardiovascular;;    Allergies  Allergen Reactions  . Lisinopril Cough  . Prednisone Other (See Comments)    Makes angry    Current Outpatient Prescriptions  Medication Sig Dispense Refill  . amLODipine (NORVASC) 5 MG tablet Take 1 tablet (5 mg total) by mouth daily. 30 tablet 6  . aspirin EC 81 MG tablet Take 1 tablet (81 mg total) by mouth daily. 30 tablet 3  . atorvastatin (LIPITOR) 80 MG tablet Take 1 tablet (80 mg total) by mouth daily at 6 PM. 30 tablet 6  . clonazePAM (KLONOPIN) 0.5 MG tablet Take 0.5 mg by mouth 2 (two) times daily as needed for anxiety.    Marland Kitchen loratadine (CLARITIN) 10 MG tablet Take 10 mg by mouth daily.     Marland Kitchen losartan (COZAAR) 25 MG tablet Take 1 tablet (25 mg total) by mouth daily. 90 tablet 3  . metoprolol tartrate (LOPRESSOR) 25 MG tablet Take 1 tablet (25 mg total) by mouth 2 (two) times daily. 60 tablet 6  . nitroGLYCERIN (NITROSTAT) 0.4 MG SL tablet Place 1 tablet (0.4 mg total) under the tongue every 5 (five) minutes as needed for chest pain. 25 tablet 3  . ranitidine (ZANTAC) 150 MG capsule Take 150 mg by mouth 2 (two) times daily.    . ticagrelor (BRILINTA) 90 MG TABS tablet Take 1 tablet (90 mg total) by mouth 2 (two) times daily. 60 tablet 6  . traMADol (ULTRAM) 50 MG tablet Take 50 mg by mouth every 6 (six) hours as needed.     No current facility-administered medications for this visit.    Social History   Social History  . Marital Status: Married    Spouse Name: Richard Simpson  . Number of Children: 2  . Years of Education: 12th grade   Occupational History  . Engineer, structural   .     Social History Main Topics  . Smoking status: Former Smoker -- 0.25  packs/day    Types: Cigarettes    Quit date: 09/04/2003  . Smokeless tobacco: Current User    Types: Chew  . Alcohol Use: 0.0 oz/week    1-2 Cans of beer per week     Comment: rare  . Drug Use: No  . Sexual Activity: Yes   Other Topics Concern  . Not on file   Social History Narrative   Patient lives at home with his wife Richard Simpson).    Patient works full time Dentist..   Education high school.   Right handed.   Caffeine one cup of coffee daily and one soda.    Family History  Problem Relation Age of Onset  . Arthritis Paternal Uncle   . Cancer Maternal Grandfather   . Anesthesia problems Neg Hx   . Hypotension Neg Hx   . Malignant hyperthermia Neg Hx   . Pseudochol deficiency Neg Hx   . Heart attack Neg Hx   . Stroke Neg Hx   . Hypertension Mother   . Hypertension Maternal Grandmother   . Diabetes    . Diabetes Mother   . Diabetes Maternal Grandmother   . Diabetes    . Diabetes Paternal Grandfather     ROS General: Negative; No fevers, chills, or night sweats HEENT: Negative; No changes in vision or hearing, sinus congestion, difficulty swallowing Pulmonary: Negative; No cough, wheezing, shortness of breath, hemoptysis Cardiovascular: See HPI: No chest pain, presyncope, syncope, palpatations GI: Negative; No nausea, vomiting, diarrhea, or abdominal pain GU: Negative; No dysuria, hematuria, or difficulty voiding Musculoskeletal: Negative; no myalgias, joint pain, or weakness Hematologic: Negative; no easy bruising, bleeding Endocrine: Negative; no heat/cold intolerance; no diabetes, Neuro: Negative; no changes in balance, headaches Skin: Negative; No rashes or skin lesions Psychiatric: Negative; No behavioral problems, depression Sleep: Negative; No snoring,  daytime sleepiness, hypersomnolence, bruxism, restless legs, hypnogognic hallucinations. Other comprehensive 14 point system review is negative   Physical Exam BP 104/70 mmHg  Pulse 65  Ht 6'  1" (1.854 m)  Wt 245 lb (111.131 kg)  BMI 32.33 kg/m2 Wt Readings from Last 3 Encounters:  11/07/14 245 lb (111.131 kg)  08/06/14 255 lb (115.667 kg)  08/05/14 255 lb 4.8 oz (115.803 kg)   General: Alert, oriented, no distress.  Skin: normal turgor, no rashes, warm and dry HEENT: Normocephalic, atraumatic. Pupils equal round and reactive to light; sclera anicteric; extraocular muscles intact, No lid lag; Nose without nasal septal hypertrophy; Mouth/Parynx benign; Mallinpatti scale 3 Neck:  Thick neck;No JVD, no carotid bruits; normal carotid upstroke Lungs: clear to ausculatation and percussion bilaterally; no wheezing or rales, normal inspiratory and expiratory effort Chest wall: without tenderness to palpitation Heart: PMI not displaced, RRR, s1 s2 normal,  Faint 1/6 systolic murmur, No diastolic murmur, no rubs, gallops, thrills, or heaves Abdomen: soft, nontender; no hepatosplenomehaly, BS+; abdominal aorta nontender and not dilated by palpation. Back: no CVA tenderness Pulses: 2+  Musculoskeletal: full range of motion, normal strength, no joint deformities Extremities: Pulses 2+, no clubbing cyanosis or edema, Homan's sign negative  Neurologic: grossly nonfocal; Cranial nerves grossly wnl Psychologic: Normal mood and affect  ECG (independently read by me): Normal sinus rhythm at 65 bpm.  Nondiagnostic T changes.  No ECG evidence of infarction.  July 2016 ECG (independently read by me):  Normal sinus rhythm at 65 bpm.  Nonspecific T-wave abnormality.  LABS:  BMP Latest Ref Rng 08/06/2014 07/06/2014 07/04/2014  Glucose 65 - 99 mg/dL 91 95 123(H)  BUN 6 - 24 mg/dL '14 9 18  ' Creatinine 0.76 - 1.27 mg/dL 0.93 0.93 0.95  BUN/Creat Ratio 9 - 20 15 - -  Sodium 134 - 144 mmol/L 143 138 139  Potassium 3.5 - 5.2 mmol/L 4.3 3.7 3.5  Chloride 97 - 108 mmol/L 102 105 107  CO2 18 - 29 mmol/L '24 22 23  ' Calcium 8.7 - 10.2 mg/dL 9.2 8.9 9.1    Hepatic Function Latest Ref Rng 08/06/2014 07/04/2014  09/23/2013  Total Protein 6.0 - 8.5 g/dL 6.7 6.8 6.9  Albumin 3.5 - 5.5 g/dL 4.3 4.2 3.9  AST 0 - 40 IU/L 36 35 30  ALT 0 - 44  IU/L 51(H) 51 46  Alk Phosphatase 39 - 117 IU/L 82 55 70  Total Bilirubin 0.0 - 1.2 mg/dL 0.9 1.0 0.6  Bilirubin, Direct 0.0 - 0.3 mg/dL - - <0.2    CBC Latest Ref Rng 08/06/2014 07/07/2014 07/06/2014  WBC 3.4 - 10.8 x10E3/uL 4.9 6.6 6.7  Hemoglobin 13.0 - 17.0 g/dL - 15.0 14.4  Hematocrit 37.5 - 51.0 % 46.1 42.4 40.7  Platelets 150 - 400 K/uL - 166 170   Lab Results  Component Value Date   MCV 90.8 07/07/2014   MCV 90.8 07/06/2014   MCV 93.5 07/05/2014    Lab Results  Component Value Date   TSH 1.480 08/06/2014    BNP No results found for: BNP  ProBNP    Component Value Date/Time   PROBNP 10.0 12/18/2013 1318     Lipid Panel     Component Value Date/Time   CHOL 114 08/06/2014 0746   TRIG 116 08/06/2014 0746   HDL 28* 08/06/2014 0746   LDLCALC 63 08/06/2014 0746     RADIOLOGY: Ct Chest Wo Contrast  10/11/2014  CLINICAL DATA:  Followup lung nodules EXAM: CT CHEST WITHOUT CONTRAST TECHNIQUE: Multidetector CT imaging of the chest was performed following the standard protocol without IV contrast. COMPARISON:  06/25/2014 FINDINGS: Mediastinum: Normal heart size. No pericardial effusion. Stent noted within the LAD. No mediastinal or hilar adenopathy. Trachea appears patent and is midline. Normal appearance of the esophagus. Lungs/Pleura: There is no pleural effusion identified. Residual perihilar ground-glass attenuation in the left upper lobe is identified, image 23 of series 3. This is favored to represent an area of resolving pneumonitis measuring 2.7 cm, image 23/ series 3. Previously 6 cm. No new areas of airspace consolidation identified. Upper Abdomen: No focal liver abnormality. Previous cholecystectomy. No biliary dilatation. The visualized portions of the spleen are normal. Musculoskeletal: There is no aggressive lytic or sclerotic bone  lesion. IMPRESSION: 1. Decrease in size of ground-glass attenuating opacity within the perihilar left upper lobe compatible with a benign, likely infectious or inflammatory process. 2. LAD stent. Electronically Signed   By: Kerby Moors M.D.   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  He has recently received 2 courses of Remicade by [facility].  He presented with an acute coronary syndrome and ruled in for non-ST segment elevation MI with a peak troponin of 18.22 on 07/05/2014 and underwent successful PCI involving a subtotal greater than 99% ostial LAD stenosis with ultimate insertion of a 3.520 mm [employer]'s Synergy DES stent which covered 3 LAD lesions. His ECG  does not show any evidence for his prior MI.  An echo Doppler study on 08/09/2014.  She showed an EF of 60-65% with grade 1 diastolic dysfunction.  There was mild mitral annular calcification and trivial aortic insufficiency.  His blood pressure today is stable on his current therapy.  His weight is 2 her 45 pounds and body mass index elevated at 32.33.  We discussed the importance of weight loss.  We discussed the importance of increased exercise, improved aerobic conditioning.  Presently, he is not having clinical angina symptomatology but at times note some vague chest sensation.  I reviewed his laboratory from July.  He is tolerating dual antiplatelet therapy without bleeding.  He is not  have any GERD symptoms on Zantac.  As long as he remains stable, I will see him in 6 months for reevaluation or sooner if problems arise.  Time spent: 25 minutes Troy Sine, MD, [facility]  11/07/2014 7:10 PM

## 2014-11-08 ENCOUNTER — Encounter (HOSPITAL_COMMUNITY): Payer: PRIVATE HEALTH INSURANCE

## 2014-11-08 ENCOUNTER — Encounter (HOSPITAL_COMMUNITY)
Admission: RE | Admit: 2014-11-08 | Discharge: 2014-11-08 | Disposition: A | Discharge status: Home / Self Care | Payer: PRIVATE HEALTH INSURANCE | Source: Ambulatory Visit | Attending: Cardiovascular Disease | Admitting: Cardiovascular Disease

## 2014-11-08 DIAGNOSIS — I252 Old myocardial infarction: Secondary | ICD-10-CM | POA: Diagnosis not present

## 2014-11-11 ENCOUNTER — Encounter (HOSPITAL_COMMUNITY)
Admission: RE | Admit: 2014-11-11 | Discharge: 2014-11-11 | Disposition: A | Discharge status: Home / Self Care | Payer: PRIVATE HEALTH INSURANCE | Source: Ambulatory Visit | Attending: Cardiovascular Disease | Admitting: Cardiovascular Disease

## 2014-11-11 DIAGNOSIS — I252 Old myocardial infarction: Secondary | ICD-10-CM | POA: Diagnosis not present

## 2014-11-13 ENCOUNTER — Encounter (HOSPITAL_COMMUNITY)
Admission: RE | Admit: 2014-11-13 | Discharge: 2014-11-13 | Disposition: A | Discharge status: Home / Self Care | Payer: PRIVATE HEALTH INSURANCE | Source: Ambulatory Visit | Attending: Cardiovascular Disease | Admitting: Cardiovascular Disease

## 2014-11-13 DIAGNOSIS — I252 Old myocardial infarction: Secondary | ICD-10-CM | POA: Diagnosis not present

## 2014-11-15 ENCOUNTER — Encounter (HOSPITAL_COMMUNITY)
Admission: RE | Admit: 2014-11-15 | Discharge: 2014-11-15 | Disposition: A | Discharge status: Home / Self Care | Payer: PRIVATE HEALTH INSURANCE | Source: Ambulatory Visit | Attending: Cardiovascular Disease | Admitting: Cardiovascular Disease

## 2014-11-15 DIAGNOSIS — I252 Old myocardial infarction: Secondary | ICD-10-CM | POA: Diagnosis not present

## 2014-11-18 ENCOUNTER — Encounter (HOSPITAL_COMMUNITY)
Admission: RE | Admit: 2014-11-18 | Discharge: 2014-11-18 | Disposition: A | Discharge status: Home / Self Care | Payer: PRIVATE HEALTH INSURANCE | Source: Ambulatory Visit | Attending: Cardiovascular Disease | Admitting: Cardiovascular Disease

## 2014-11-18 DIAGNOSIS — I252 Old myocardial infarction: Secondary | ICD-10-CM | POA: Diagnosis not present

## 2014-11-20 ENCOUNTER — Encounter (HOSPITAL_COMMUNITY)
Admission: RE | Admit: 2014-11-20 | Discharge: 2014-11-20 | Disposition: A | Discharge status: Home / Self Care | Payer: PRIVATE HEALTH INSURANCE | Source: Ambulatory Visit | Attending: Cardiovascular Disease | Admitting: Cardiovascular Disease

## 2014-11-20 DIAGNOSIS — I252 Old myocardial infarction: Secondary | ICD-10-CM | POA: Diagnosis not present

## 2014-11-22 ENCOUNTER — Encounter (HOSPITAL_COMMUNITY)
Admission: RE | Admit: 2014-11-22 | Discharge: 2014-11-22 | Disposition: A | Discharge status: Home / Self Care | Payer: PRIVATE HEALTH INSURANCE | Source: Ambulatory Visit | Attending: Cardiovascular Disease | Admitting: Cardiovascular Disease

## 2014-11-22 DIAGNOSIS — I252 Old myocardial infarction: Secondary | ICD-10-CM | POA: Diagnosis not present

## 2014-11-25 ENCOUNTER — Encounter (HOSPITAL_COMMUNITY): Payer: PRIVATE HEALTH INSURANCE

## 2014-11-27 ENCOUNTER — Encounter (HOSPITAL_COMMUNITY): Payer: PRIVATE HEALTH INSURANCE

## 2014-11-29 ENCOUNTER — Encounter (HOSPITAL_COMMUNITY): Payer: PRIVATE HEALTH INSURANCE

## 2014-12-10 NOTE — Progress Notes (Signed)
Cardiac Rehabilitation Program Outcomes Report   Orientation:  08/06/14 Graduate Date:  11/22/14 Discharge Date:  11/22/14 # of sessions completed: 36  Cardiologist: Harl Bowie Family MD:  Bethann Berkshire Time:  [id number]  A.  Exercise Program:  Tolerates exercise @ 6.00 METS for 15 minutes, Walk Test Results:  Post: 3.10 mets, Improved functional capacity  2.31 % and Improved  muscular strength  5.54 %  B.  Mental Health:  Good mental attitude, Quality of Life (QOL)  improvements:  Overall  1.72 %, Health/Functioning 17.04 %, Socioeconomics -12.56 %, Psych/Spiritual -2.59 %, Family 0 %   and PHQ-9: 1  C.  Education/Instruction/Skills  Accurately checks own pulse.  Rest:  76  Exercise:  111, Knows THR for exercise and Attended 13 education classes  Uses Perceived Exertion Scale and/or Dyspnea Scale  D.  Nutrition/Weight Control/Body Composition:  Adherence to prescribed nutrition program: fair  and Patient has lost 5.6 kg   E.  Blood Lipids    Lab Results  Component Value Date   CHOL 114 08/06/2014   HDL 28* 08/06/2014   LDLCALC 63 08/06/2014   TRIG 116 08/06/2014    F.  Lifestyle Changes:  Making positive lifestyle changes and Not smoking:  Quit 2001  G.  Symptoms noted with exercise:  Asymptomatic  Report Completed By:  Stevphen Rochester RN   Comments:  This is the patients graduation progress note for [facility].  Patient has done well in the program.  Patient plans to exercise at local gym.

## 2014-12-10 NOTE — Progress Notes (Signed)
Patient is discharged from [facility] program today, 11/22/14 with 36 sessions.  He achieved LTG of 30 minutes of aerobic exercise at max met level of 6.00.  All patient vitals are WNL.  Patient has not met with dietician.  Discharge instructions have been reviewed in detail and patient expressed an understanding of material given.  Patient plans to exercise at local gym. Cardiac Rehab will make 1 month, 6 month and 1 year call backs.  Patient had no complaints of any abnormal S/S or pain on their exit visit.  Patient finished post walk test.   

## 2015-01-02 ENCOUNTER — Other Ambulatory Visit: Payer: Self-pay | Admitting: Cardiovascular Disease

## 2015-01-02 NOTE — Telephone Encounter (Signed)
REFILL 

## 2015-02-24 ENCOUNTER — Encounter: Payer: Self-pay | Admitting: Adult Health

## 2015-02-24 ENCOUNTER — Ambulatory Visit (INDEPENDENT_AMBULATORY_CARE_PROVIDER_SITE_OTHER): Payer: PRIVATE HEALTH INSURANCE | Admitting: Adult Health

## 2015-02-24 VITALS — BP 126/70 | HR 69 | Temp 98.6°F | Ht 73.0 in | Wt 257.0 lb

## 2015-02-24 DIAGNOSIS — R9389 Abnormal findings on diagnostic imaging of other specified body structures: Secondary | ICD-10-CM | POA: Insufficient documentation

## 2015-02-24 DIAGNOSIS — R938 Abnormal findings on diagnostic imaging of other specified body structures: Secondary | ICD-10-CM | POA: Diagnosis not present

## 2015-02-24 NOTE — Patient Instructions (Signed)
Continue on current regimen  Follow up with Dr. Elsworth Soho  In  3 months and As needed   Please contact office for sooner follow up if symptoms do not improve or worsen or seek emergency care

## 2015-02-24 NOTE — Assessment & Plan Note (Signed)
LUL GGO seen in 5/016 felt to inflammatory in nature  Serial CT chest done 09/2014 with sign decreased in size from 6 cm to 2.7cm  He is clinically stable .  Will continue to follow .  He will return in 3 months with Dr. Elsworth Soho  .  Consider repeat CT chest in 3-6 months on return ov if indicated.

## 2015-02-24 NOTE — Progress Notes (Signed)
Reviewed & agree with plan  

## 2015-02-24 NOTE — Progress Notes (Signed)
Subjective:    Patient ID: [facility], male    DOB: [birth year], 45 y.o.   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 Ouida Sills) with previous biologic treatment. Seen for pulmonary consult   06/2013 NSTEMI, PTCA to LAD, EF 45% 06/25/2014 CT angio chest >> Patchy regions of ground-glass attenuation opacity 52mm & 6cm in the medial aspect of the left upper lobe with associated volume loss 09/2014 CT chest >Decrease in size of ground-glass attenuating opacity within the perihilar left upper lobe compatible with a benign, likely infectious or inflammatory process.  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  Pt has been followed for abnormal area on CT chest . Noted to have GGO in LUL on chest CT This was felt to be inflammatory in nature as lack of infectious symptoms.  He had a follow up CT chest in 09/2014 that showed significant decrease in size down to 2.7cm (previously 6cm ) felt to be area of resolving pneumonitis . Reviewed these scans and results with patient.  He is feeling better. No cough, congestion or wheezing. Does wear out with little energy but is buidling stamina back up , finished card rehab (PCI/card stent  in 06/2014)  . Now goes to gym 3-4 days a week.  Denies chest pain, orthopnea, edema, hemoptysis , or wt loss.    Past Medical History  Diagnosis Date  . CAD (coronary artery disease)     a. 10/2013 Cath: mod LAD dzs->nl FFR-> medically managed;  b. 06/2014 NSTEMI/PCi: LAD 99ost, 70p, 34m (entire area covered with a 3.5x20 Synergy DES), LCX nl, RCA 52m, 10d, EF 45-50%.  . Headache(784.0)   . RLS (restless legs syndrome)   . Primary snoring     . Obesity   . Hypertension   . Dyslipidemia   . Rheumatoid arthritis ([place])   . Ischemic cardiomyopathy     a. 06/2014 EF 45-50% by LV gram.  . Acute MI [facility])    Current Outpatient Prescriptions on File Prior to Visit  Medication Sig Dispense Refill  . amLODipine (NORVASC) 5 MG tablet Take 1 tablet (5 mg total) by mouth daily. 30 tablet 6  . aspirin EC 81 MG tablet Take 1 tablet (81 mg total) by mouth daily. 30 tablet 3  . atorvastatin (LIPITOR) 80 MG tablet TAKE ONE TABLET BY MOUTH ONCE DAILY. 30 tablet 6  . clonazePAM (KLONOPIN) 0.5 MG tablet Take 0.5 mg by mouth 2 (two) times daily as needed for anxiety.    Marland Kitchen loratadine (CLARITIN) 10 MG tablet Take 10 mg by mouth daily.     Marland Kitchen losartan (COZAAR) 25 MG tablet Take 1 tablet (25 mg total) by mouth daily. 90 tablet 3  . metoprolol tartrate (LOPRESSOR) 25 MG tablet Take 1 tablet (25 mg total) by mouth 2 (two) times daily. 60 tablet 6  . nitroGLYCERIN (NITROSTAT) 0.4 MG SL tablet Place 1 tablet (0.4 mg total) under the tongue every 5 (five) minutes as needed for chest pain. 25 tablet 3  . ranitidine (ZANTAC) 150 MG capsule Take 150 mg by mouth 2 (two) times daily.    . ticagrelor (BRILINTA) 90 MG TABS  tablet Take 1 tablet (90 mg total) by mouth 2 (two) times daily. 60 tablet 6  . traMADol (ULTRAM) 50 MG tablet Take 50 mg by mouth every 6 (six) hours as needed.     No current facility-administered medications on file prior to visit.       Review of Systems Constitutional:   No  weight loss, night sweats,  Fevers, chills,  +fatigue, or  lassitude.  HEENT:   No headaches,  Difficulty swallowing,  Tooth/dental problems, or  Sore throat,                No sneezing, itching, ear ache, nasal congestion, post nasal drip,   CV:  No chest pain,  Orthopnea, PND, swelling in lower extremities, anasarca, dizziness, palpitations, syncope.   GI  No heartburn, indigestion, abdominal pain, nausea, vomiting, diarrhea, change in bowel habits, loss of  appetite, bloody stools.   Resp: No shortness of breath with exertion or at rest.  No excess mucus, no productive cough,  No non-productive cough,  No coughing up of blood.  No change in color of mucus.  No wheezing.  No chest wall deformity  Skin: no rash or lesions.  GU: no dysuria, change in color of urine, no urgency or frequency.  No flank pain, no hematuria   MS:  No joint pain or swelling.  No decreased range of motion.  No back pain.  Psych:  No change in mood or affect. No depression or anxiety.  No memory loss.         Objective:   Physical Exam Filed Vitals:   02/24/15 1118  BP: 126/70  Pulse: 69  Temp: 98.6 F (37 C)  TempSrc: Oral  Height: 6\' 1"  (1.854 m)  Weight: 257 lb (116.574 kg)  SpO2: 97%  Body mass index is 33.91 kg/(m^2).  GEN: A/Ox3; pleasant , NAD, obese   HEENT:  [place]/AT,  EACs-clear, TMs-wnl, NOSE-clear, THROAT-clear, no lesions, no postnasal drip or exudate noted.   NECK:  Supple w/ fair ROM; no JVD; normal carotid impulses w/o bruits; no thyromegaly or nodules palpated; no lymphadenopathy.  RESP  Clear  P & A; w/o, wheezes/ rales/ or rhonchi.no accessory muscle use, no dullness to percussion  CARD:  RRR, no m/r/g  , no peripheral edema, pulses intact, no cyanosis or clubbing.  GI:   Soft & nt; nml bowel sounds; no organomegaly or masses detected.  Musco: Warm bil, no deformities or joint swelling noted.   Neuro: alert, no focal deficits noted.    Skin: Warm, no lesions or rashes   CT 09/2014 Decrease in size of ground-glass attenuating opacity within the perihilar left upper lobe compatible with a benign, likely infectious or inflammatory process.      Assessment & Plan:

## 2015-03-17 ENCOUNTER — Other Ambulatory Visit: Payer: Self-pay | Admitting: Nurse Practitioner

## 2015-03-17 NOTE — Telephone Encounter (Signed)
REFILL 

## 2015-03-18 ENCOUNTER — Telehealth: Payer: Self-pay | Admitting: Adult Health

## 2015-03-18 NOTE — Telephone Encounter (Signed)
Called spoke with pt. He denies any cough, no congestion and no fever. He reports the increase SOB and labored breathing has been going on since visit 02/24/15. He reports he is still trying to go work out at [employer] but now only 25-30 min a day now d/t his breathing Please advise TP thanks

## 2015-03-18 NOTE — Telephone Encounter (Signed)
Is he coughing , congestion , fever ???   At ov we discussed his CT results  He did not mention that these were major issue. How long have they been going on .

## 2015-03-18 NOTE — Telephone Encounter (Signed)
Unsure what this is related to .  May need to be seen again to sort out. With no cough or congestion hard to tell if this is pulmoanry vs cardiac as he has cardiac hx.  Please contact office for sooner follow up if symptoms do not improve or worsen or seek emergency care  Clarise Cruz NP has opening tomorrow.

## 2015-03-18 NOTE — Telephone Encounter (Signed)
Pt recently seen by TP 02/24/15 - pt calling stating that his breathing is not improving since last OV. Reports more labored breathing and increased SOB with everyday activities.  Feeling some tightness in chest but not sure if related to breathing or anxiety because of the SOB.  Please advise TP. Thanks.

## 2015-03-18 NOTE — Telephone Encounter (Signed)
Called spoke with pt. He is scheduled to see MW in the AM at 9:45. Nothing further needed

## 2015-03-19 ENCOUNTER — Other Ambulatory Visit (INDEPENDENT_AMBULATORY_CARE_PROVIDER_SITE_OTHER): Payer: PRIVATE HEALTH INSURANCE

## 2015-03-19 ENCOUNTER — Ambulatory Visit (INDEPENDENT_AMBULATORY_CARE_PROVIDER_SITE_OTHER): Payer: PRIVATE HEALTH INSURANCE | Admitting: Internal Medicine

## 2015-03-19 ENCOUNTER — Ambulatory Visit (INDEPENDENT_AMBULATORY_CARE_PROVIDER_SITE_OTHER)
Admission: RE | Admit: 2015-03-19 | Discharge: 2015-03-19 | Disposition: A | Discharge status: Home / Self Care | Payer: PRIVATE HEALTH INSURANCE | Source: Ambulatory Visit | Attending: Internal Medicine | Admitting: Internal Medicine

## 2015-03-19 ENCOUNTER — Encounter: Payer: Self-pay | Admitting: Internal Medicine

## 2015-03-19 VITALS — BP 128/76 | HR 61 | Ht 73.0 in | Wt 257.0 lb

## 2015-03-19 DIAGNOSIS — R0600 Dyspnea, unspecified: Secondary | ICD-10-CM | POA: Diagnosis not present

## 2015-03-19 LAB — SEDIMENTATION RATE: SED RATE: 4 mm/h (ref 0–22)

## 2015-03-19 LAB — BASIC METABOLIC PANEL
BUN: 17 mg/dL (ref 6–23)
CHLORIDE: 105 meq/L (ref 96–112)
CO2: 28 mEq/L (ref 19–32)
CREATININE: 0.94 mg/dL (ref 0.40–1.50)
Calcium: 9.7 mg/dL (ref 8.4–10.5)
GFR: 92.81 mL/min (ref >60.00)
Glucose, Bld: 92 mg/dL (ref 70–99)
POTASSIUM: 4.3 meq/L (ref 3.5–5.1)
Sodium: 139 mEq/L (ref 135–145)

## 2015-03-19 LAB — CBC WITH DIFFERENTIAL/PLATELET
BASOS ABS: 0 10*3/uL (ref 0.0–0.1)
Basophils Relative: 0.5 % (ref 0.0–3.0)
EOS PCT: 0.2 % (ref 0.0–5.0)
Eosinophils Absolute: 0 10*3/uL (ref 0.0–0.7)
HCT: 48.2 % (ref 39.0–52.0)
HEMOGLOBIN: 16.9 g/dL (ref 13.0–17.0)
Lymphocytes Relative: 25.6 % (ref 12.0–46.0)
Lymphs Abs: 1.5 10*3/uL (ref 0.7–4.0)
MCHC: 35 g/dL (ref 30.0–36.0)
MCV: 91.6 fl (ref 78.0–100.0)
MONO ABS: 0.7 10*3/uL (ref 0.1–1.0)
Monocytes Relative: 12.9 % — ABNORMAL HIGH (ref 3.0–12.0)
Neutro Abs: 3.5 10*3/uL (ref 1.4–7.7)
Neutrophils Relative %: 60.8 % (ref 43.0–77.0)
Platelets: 196 10*3/uL (ref 150.0–400.0)
RBC: 5.26 Mil/uL (ref 4.22–5.81)
RDW: 12.9 % (ref 11.5–15.5)
WBC: 5.8 10*3/uL (ref 4.0–10.5)

## 2015-03-19 LAB — BRAIN NATRIURETIC PEPTIDE: PRO B NATRI PEPTIDE: 18 pg/mL (ref 0.0–100.0)

## 2015-03-19 NOTE — Progress Notes (Signed)
Subjective:    Patient ID: Richard Simpson, male    DOB: 1970/10/27, 45 y.o.   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 Ouida Sills) with previous biologic treatment. Seen for pulmonary consult   06/2013 NSTEMI, PTCA to LAD, EF 45% 06/25/2014 CT angio chest >> Patchy regions of ground-glass attenuation opacity 34mm & 6cm in the medial aspect of the left upper lobe with associated volume loss 09/2014 CT chest >Decrease in size of ground-glass attenuating opacity within the perihilar left upper lobe compatible with a benign, likely infectious or inflammatory process.  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  Pt has been followed for abnormal area on CT chest . Noted to have GGO in LUL on chest CT This was felt to be inflammatory in nature as lack of infectious symptoms.  He had a follow up CT chest in 09/2014 that showed significant decrease in size down to 2.7cm (previously 6cm ) felt to be area of resolving pneumonitis . Reviewed these scans and results with patient.  He is feeling better. No cough, congestion or wheezing. Does wear out with little energy but is buidling stamina back up , finished card rehab (PCI/card stent  in 06/2014)  . Now goes to gym 3-4 days a week.  rec Continue on current regimen  Follow up with Dr. Elsworth Soho  In  3 months and As needed     03/19/2015 acute extended ov/Wert re: sob  Chief Complaint  Patient presents with  . Acute Visit    RA pt c/o worsening SOB X2 wks.  Notes chest tightness, pnd.  Denies chest pain, mucus production, fever.   chest tightness always occurs with some activity/ always goes away with 10 - 15 min rest, never   tried ntg remicade helping arthritis  Had built up to 45 min on gxt now down to 25 same speed and elevation  No cough at all   No obvious day to day or daytime variability or assoc  subjective wheeze or overt sinus or hb symptoms. No unusual exp hx or h/o childhood pna/ asthma or knowledge of premature birth.  Sleeping ok without nocturnal  or early am exacerbation  of respiratory  c/o's or need for noct saba. Also denies any obvious fluctuation of symptoms with weather or environmental changes or other aggravating or alleviating factors except as outlined above   Current Medications, Allergies, Complete Past Medical History, Past Surgical History, Family History, and Social History were reviewed in [facility] record.  ROS  The following are not active complaints unless bolded sore throat, dysphagia, dental problems, itching, sneezing,  nasal congestion or excess/ purulent secretions, ear ache,   fever, chills, sweats, unintended wt loss, classically pleuritic or exertional cp, hemoptysis,  orthopnea pnd or leg swelling, presyncope, palpitations, abdominal pain, anorexia, nausea, vomiting, diarrhea  or change in bowel or bladder habits, change in stools or urine, dysuria,hematuria,  rash, arthralgias, visual complaints, headache, numbness, weakness or ataxia or problems with walking or coordination,  change in mood/affect or memory.  Objective:   Physical Exam  amb obese wm nad   Wt Readings from Last 3 Encounters:  03/19/15 257 lb (116.574 kg)  02/24/15 257 lb (116.574 kg)  11/07/14 245 lb (111.131 kg)    Vital signs reviewed     GEN: A/Ox3; pleasant , NAD, obese   HEENT:  [place]/AT,  EACs-clear, TMs-wnl, NOSE-clear, THROAT-clear, no lesions, no postnasal drip or exudate noted.   NECK:  Supple w/ fair ROM; no JVD; normal carotid impulses w/o bruits; no thyromegaly or nodules palpated; no lymphadenopathy.  RESP  Clear  P &  A; w/o, wheezes/ rales/ or rhonchi.no accessory muscle use, no dullness to percussion  CARD:  RRR, no m/r/g  , no peripheral edema, pulses intact, no cyanosis or clubbing.  GI:   Soft & nt; nml bowel sounds; no organomegaly or masses detected.  Musco: Warm bil, no deformities or joint swelling noted.   Neuro: alert, no focal deficits noted.    Skin: Warm, no lesions or rashes   CXR PA and Lateral:   03/19/2015 :    I personally reviewed images and agree with radiology impression as follows:    Mild hypoinflation. There is no acute cardiopulmonary abnormality   Labs ordered/ reviewed:      Chemistry      Component Value Date/Time   NA 138 03/20/2015 1551   NA 143 08/06/2014 0746   K 4.3 03/20/2015 1551   CL 104 03/20/2015 1551   CO2 23 03/20/2015 1551   BUN 19 03/20/2015 1551   BUN 14 08/06/2014 0746   CREATININE 0.90 03/20/2015 1551   CREATININE 0.94 03/19/2015 1034      Component Value Date/Time   CALCIUM 9.5 03/20/2015 1551   ALKPHOS 82 08/06/2014 0746   AST 36 08/06/2014 0746   ALT 51* 08/06/2014 0746   BILITOT 0.9 08/06/2014 0746   BILITOT 1.0 07/04/2014 2038        Lab Results  Component Value Date   WBC 6.3 03/20/2015   HGB 15.9 03/20/2015   HCT 45.5 03/20/2015   MCV 90.6 03/20/2015   PLT 198 03/20/2015         Lab Results  Component Value Date   TSH 1.480 08/06/2014     Lab Results  Component Value Date   PROBNP 18.0 03/19/2015       Lab Results  Component Value Date   ESRSEDRATE 4 03/19/2015   ESRSEDRATE 2 08/06/2014           Assessment & Plan:

## 2015-03-19 NOTE — Patient Instructions (Signed)
Please remember to go to the lab and x-ray department downstairs for your tests - we will call you with the results when they are available.  Next time you experience the chest tightness with exertion sit down and take a nitro and see if it improves it and if it does it likely is coming from your heart not your lungs but could also be from reflux  GERD (REFLUX)  is an extremely common cause of  symptoms just like yours , many times with no obvious heartburn at all.    It can be treated with medication, but also with lifestyle changes including elevation of the head of your bed (ideally with 6 inch  bed blocks),  Smoking cessation, avoidance of late meals, excessive alcohol, and avoid fatty foods, chocolate, peppermint, colas, red wine, and acidic juices such as orange juice.  NO MINT OR MENTHOL PRODUCTS SO NO COUGH DROPS  USE SUGARLESS CANDY INSTEAD (Jolley ranchers or Stover's or Life Savers) or even ice chips will also do - the key is to swallow to prevent all throat clearing. NO OIL BASED VITAMINS - use powdered substitutes.

## 2015-03-20 ENCOUNTER — Encounter: Payer: Self-pay | Admitting: Cardiology

## 2015-03-20 ENCOUNTER — Ambulatory Visit (INDEPENDENT_AMBULATORY_CARE_PROVIDER_SITE_OTHER): Payer: PRIVATE HEALTH INSURANCE | Admitting: Cardiology

## 2015-03-20 ENCOUNTER — Encounter: Payer: Self-pay | Admitting: *Deleted

## 2015-03-20 VITALS — BP 98/72 | HR 67 | Ht 73.0 in | Wt 256.8 lb

## 2015-03-20 DIAGNOSIS — I2510 Atherosclerotic heart disease of native coronary artery without angina pectoris: Secondary | ICD-10-CM

## 2015-03-20 DIAGNOSIS — I10 Essential (primary) hypertension: Secondary | ICD-10-CM | POA: Diagnosis not present

## 2015-03-20 DIAGNOSIS — I208 Other forms of angina pectoris: Secondary | ICD-10-CM

## 2015-03-20 DIAGNOSIS — I2583 Coronary atherosclerosis due to lipid rich plaque: Secondary | ICD-10-CM

## 2015-03-20 LAB — CBC WITH DIFFERENTIAL/PLATELET
BASOS PCT: 0 % (ref 0–1)
Basophils Absolute: 0 10*3/uL (ref 0.0–0.1)
Eosinophils Absolute: 0 10*3/uL (ref 0.0–0.7)
Eosinophils Relative: 0 % (ref 0–5)
HCT: 45.5 % (ref 39.0–52.0)
HEMOGLOBIN: 15.9 g/dL (ref 13.0–17.0)
Lymphocytes Relative: 26 % (ref 12–46)
Lymphs Abs: 1.6 10*3/uL (ref 0.7–4.0)
MCH: 31.7 pg (ref 26.0–34.0)
MCHC: 34.9 g/dL (ref 30.0–36.0)
MCV: 90.6 fL (ref 78.0–100.0)
MPV: 10.7 fL (ref 8.6–12.4)
Monocytes Absolute: 0.8 10*3/uL (ref 0.1–1.0)
Monocytes Relative: 12 % (ref 3–12)
NEUTROS ABS: 3.9 10*3/uL (ref 1.7–7.7)
NEUTROS PCT: 62 % (ref 43–77)
Platelets: 198 10*3/uL (ref 150–400)
RBC: 5.02 MIL/uL (ref 4.22–5.81)
RDW: 13 % (ref 11.5–15.5)
WBC: 6.3 10*3/uL (ref 4.0–10.5)

## 2015-03-20 LAB — PROTIME-INR
INR: 1.06 (ref <1.50)
PROTHROMBIN TIME: 13.9 s (ref 11.6–15.2)

## 2015-03-20 NOTE — Patient Instructions (Signed)
Medication Instructions:  Your physician recommends that you continue on your current medications as directed. Please refer to the Current Medication list given to you today.   Labwork: TODAY:  BMET                 CBC W/DIFF                 PT / INR  Testing/Procedures: Your physician has requested that you have a cardiac catheterization. Cardiac catheterization is used to diagnose and/or treat various heart conditions. Doctors may recommend this procedure for a number of different reasons. The most common reason is to evaluate chest pain. Chest pain can be a symptom of coronary artery disease (CAD), and cardiac catheterization can show whether plaque is narrowing or blocking your heart's arteries. This procedure is also used to evaluate the valves, as well as measure the blood flow and oxygen levels in different parts of your heart. For further information please visit [email or web address]. Please follow instruction sheet, as given.    Follow-Up: Your physician recommends that you schedule a follow-up appointment in: WILL BE SET UP AT DISCHARGE   Any Other Special Instructions Will Be Listed Below (If Applicable).     If you need a refill on your cardiac medications before your next appointment, please call your pharmacy.

## 2015-03-20 NOTE — Progress Notes (Signed)
03/20/2015 Richard Simpson   03-30-1970  [id number]  Primary Physician Purvis Kilts, MD Primary Cardiologist: Dr. Claiborne Billings   Reason for Visit/CC: Exertional CP and Dyspnea.  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   He initially presented in 2015 with CP. He underwent cardiac catheterization October 2015 and was felt to have nonobstructive CAD at that time. He developed severe chest pain the evening of July 04, 2014 leading to his [facility] hospitalization on the morning of June 10. He was seen in consultation by Dr. Harl Bowie and transferred for urgent cardiac catheterization which was done by Dr. Claiborne Billings. This revealed a subtotal 99+ percent ostial LAD stenosis followed by proximal 70% and 50% stenoses. Initial ejection fraction was 45% with anterolateral hypocontractility. He underwent successful intervention with insertion of Synergy DES 3.520 mm stent placed from the LAD ostium to cover all 3 lesions.He did remarkably well post PCI and symptoms resolved.   Over the last 2 weeks he has noticed recurrence in symptoms, although not as severe as when he had his MI in June. He notes exertional chest tightness and DOE. His CP dose not radiate. He denies associated diaphoresis, n/v, syncope/ near syncope. Symptoms resolved spontaneously after 5-10 minutes of rest. He denies any resting symptoms. He has not required use of SL NTG. He reports that he has been fully compliant with his medications, including DAPT with ASA and Brlinta. He has also been fully compliant with lipitor, metoprolol, losartan and amlodipine. His EKG today shows NSR with nonspecific t wave abnormalities. He is currently asymptomatic in clinic today. HR is 67 bpm. BP is soft but stable in the upper 0000000 systolic.    Current Outpatient Prescriptions  Medication Sig Dispense Refill  .  amLODipine (NORVASC) 5 MG tablet Take 1 tablet (5 mg total) by mouth daily. 30 tablet 6  . aspirin EC 81 MG tablet Take 1 tablet (81 mg total) by mouth daily. 30 tablet 3  . atorvastatin (LIPITOR) 80 MG tablet TAKE ONE TABLET BY MOUTH ONCE DAILY. 30 tablet 6  . clonazePAM (KLONOPIN) 0.5 MG tablet Take 0.5 mg by mouth 2 (two) times daily as needed for anxiety.    . InFLIXimab (REMICADE IV) Inject into the vein as directed. Every 7 weeks    . loratadine (CLARITIN) 10 MG tablet Take 10 mg by mouth daily.     Marland Kitchen losartan (COZAAR) 25 MG tablet Take 1 tablet (25 mg total) by mouth daily. 90 tablet 3  . metoprolol tartrate (LOPRESSOR) 25 MG tablet TAKE (1) TABLET BY MOUTH TWICE DAILY. 60 tablet 4  . nitroGLYCERIN (NITROSTAT) 0.4 MG SL tablet Place 0.4 mg under the tongue every 5 (five) minutes as needed for chest pain (x 3 doses).    . ranitidine (ZANTAC) 150 MG capsule Take 150 mg by mouth 2 (two) times daily.    . ticagrelor (BRILINTA) 90 MG TABS tablet Take 1 tablet (90 mg total) by mouth 2 (two) times daily. 60 tablet 6  . traMADol (ULTRAM) 50 MG tablet Take 50 mg by mouth every 6 (six) hours as needed.     No current facility-administered medications for this visit.    Allergies  Allergen Reactions  . Lisinopril Cough  . Prednisone Other (See Comments)    Makes angry    Social History   Social History  . Marital  Status: Married    Spouse Name: Richard Simpson  . Number of Children: 2  . Years of Education: 12th grade   Occupational History  . Engineer, structural   .     Social History Main Topics  . Smoking status: Former Smoker -- 0.25 packs/day    Types: Cigarettes    Quit date: 09/04/2003  . Smokeless tobacco: Current User    Types: Chew  . Alcohol Use: 0.0 oz/week    1-2 Cans of beer per week     Comment: rare  . Drug Use: No  . Sexual Activity: Yes   Other Topics Concern  . Not on file   Social History Narrative   Patient lives at home with his wife Richard Simpson).    Patient works  full time Dentist..   Education high school.   Right handed.   Caffeine one cup of coffee daily and one soda.     Review of Systems: General: negative for chills, fever, night sweats or weight changes.  Cardiovascular: negative for chest pain, dyspnea on exertion, edema, orthopnea, palpitations, paroxysmal nocturnal dyspnea or shortness of breath Dermatological: negative for rash Respiratory: negative for cough or wheezing Urologic: negative for hematuria Abdominal: negative for nausea, vomiting, diarrhea, bright red blood per rectum, melena, or hematemesis Neurologic: negative for visual changes, syncope, or dizziness All other systems reviewed and are otherwise negative except as noted above.    Blood pressure 98/72, pulse 67, height 6\' 1"  (1.854 m), weight 256 lb 12.8 oz (116.484 kg).  General appearance: alert, cooperative, no distress and moderately obese Neck: no carotid bruit and no JVD Lungs: clear to auscultation bilaterally Heart: regular rate and rhythm, S1, S2 normal, no murmur, click, rub or gallop Extremities: no LEE Pulses: 2+ and symmetric Skin: warm and dry Neurologic: Grossly normal  EKG NSR with nonspecific t wave abnormalities.  ASSESSMENT AND PLAN:   1. CAD/ Exertional Angina: Patient with known history of CAD s/p DES to proximal to mid LAD less than 1 year ago. He has had exertional chest pain, dyspnea/ decreased exercise tolerance recently but no resting symptoms. EKG shows NSR with nonspecific T wave abnormalities. He is currently CP fee and has been fully compliant with his meds including DAPT. His BP limits initiation of a long acting nitrate with systolic BP in the 0000000. He is to continue ASA, Brilinta, Lipitor, metoprolol and amlodipine. Given his symptoms and RA, we have elected to set him up for outpatient LHC next week to assess stent patency. Will try to arrange with Dr. Claiborne Billings, if possible. For now, continue medical therapy. Patient  instructed to avoid physical activity as much as possible until his cath. I have reviewed all of the potential associated risk of procedure. Patient agrees to proceed. We will check a CBC anc BMP today. He has SL NTG at home. We reviewed proper use and when to seek emergent evaluation. He verbalized understanding.    Lyda Jester PA-C 03/20/2015 5:24 PM

## 2015-03-21 ENCOUNTER — Encounter (HOSPITAL_COMMUNITY): Payer: Self-pay | Admitting: Cardiology

## 2015-03-21 ENCOUNTER — Encounter: Payer: Self-pay | Admitting: Internal Medicine

## 2015-03-21 DIAGNOSIS — I209 Angina pectoris, unspecified: Secondary | ICD-10-CM | POA: Diagnosis present

## 2015-03-21 DIAGNOSIS — E785 Hyperlipidemia, unspecified: Secondary | ICD-10-CM | POA: Diagnosis present

## 2015-03-21 LAB — BASIC METABOLIC PANEL
BUN: 19 mg/dL (ref 7–25)
CALCIUM: 9.5 mg/dL (ref 8.6–10.3)
CHLORIDE: 104 mmol/L (ref 98–110)
CO2: 23 mmol/L (ref 20–31)
Creat: 0.9 mg/dL (ref 0.60–1.35)
GLUCOSE: 93 mg/dL (ref 65–99)
Potassium: 4.3 mmol/L (ref 3.5–5.3)
Sodium: 138 mmol/L (ref 135–146)

## 2015-03-21 NOTE — Assessment & Plan Note (Signed)
Echo 08/09/14 Left ventricle: The cavity size was normal. There was mild focal basal hypertrophy of the septum. Systolic function was normal. The estimated ejection fraction was in the range of 60% to 65%. Wall motion was normal; there were no regional wall motion abnormalities. Doppler parameters are consistent with abnormal left ventricular relaxation (grade 1 diastolic dysfunction). - Aortic valve: There was trivial regurgitation 03/19/2015  Walked RA x 3 laps @ 185 ft each stopped due to End of study, fast  pace, no sob or desat      Symptoms are markedly disproportionate to objective findings and not clear this is a lung problem but pt does appear to have difficult airway management issues. DDX of  difficult airways management almost all start with A and  include Adherence, Ace Inhibitors, Acid Reflux, Active Sinus Disease, Alpha 1 Antitripsin deficiency, Anxiety masquerading as Airways dz,  ABPA,  Allergy(esp in young), Aspiration (esp in elderly), Adverse effects of meds,  Active smokers, A bunch of PE's (a small clot burden can't cause this syndrome unless there is already severe underlying pulm or vascular dz with poor reserve) plus two Bs  = Bronchiectasis and Beta blocker use..and one C= CHF  Adherence is always the initial "prime suspect" and is a multilayered concern that requires a "trust but verify" approach in every patient - starting with knowing how to use medications, especially inhalers, correctly, keeping up with refills and understanding the fundamental difference between maintenance and prns vs those medications only taken for a very short course and then stopped and not refilled.   ? Acid (or non-acid) GERD > always difficult to exclude as up to 75% of pts in some series report no assoc GI/ Heartburn symptoms> rec max (24h)  acid suppression and diet restrictions/ reviewed and instructions given in writing.   ? Allergy/ asthma > no assoc cough/variability/ noct  symptoms makes this less likely   ? chf > ruled out with such a low bnp but could be having AP > rec try ntg next time gets chest tight with ex

## 2015-03-24 ENCOUNTER — Ambulatory Visit (HOSPITAL_COMMUNITY)
Admission: RE | Admit: 2015-03-24 | Discharge: 2015-03-24 | Disposition: A | Discharge status: Home / Self Care | Payer: PRIVATE HEALTH INSURANCE | Source: Ambulatory Visit | Attending: Cardiology | Admitting: Cardiology

## 2015-03-24 ENCOUNTER — Encounter (HOSPITAL_COMMUNITY): Payer: Self-pay | Admitting: Cardiology

## 2015-03-24 ENCOUNTER — Other Ambulatory Visit: Payer: Self-pay

## 2015-03-24 ENCOUNTER — Encounter (HOSPITAL_COMMUNITY)
Admission: RE | Disposition: A | Discharge status: Home / Self Care | Payer: Self-pay | Source: Ambulatory Visit | Attending: Cardiology

## 2015-03-24 DIAGNOSIS — Z87891 Personal history of nicotine dependence: Secondary | ICD-10-CM | POA: Diagnosis not present

## 2015-03-24 DIAGNOSIS — E785 Hyperlipidemia, unspecified: Secondary | ICD-10-CM | POA: Diagnosis present

## 2015-03-24 DIAGNOSIS — I252 Old myocardial infarction: Secondary | ICD-10-CM

## 2015-03-24 DIAGNOSIS — M069 Rheumatoid arthritis, unspecified: Secondary | ICD-10-CM | POA: Diagnosis not present

## 2015-03-24 DIAGNOSIS — Z955 Presence of coronary angioplasty implant and graft: Secondary | ICD-10-CM | POA: Insufficient documentation

## 2015-03-24 DIAGNOSIS — I25119 Atherosclerotic heart disease of native coronary artery with unspecified angina pectoris: Secondary | ICD-10-CM | POA: Diagnosis not present

## 2015-03-24 DIAGNOSIS — Z7982 Long term (current) use of aspirin: Secondary | ICD-10-CM | POA: Insufficient documentation

## 2015-03-24 DIAGNOSIS — I2510 Atherosclerotic heart disease of native coronary artery without angina pectoris: Secondary | ICD-10-CM | POA: Diagnosis present

## 2015-03-24 DIAGNOSIS — Z9861 Coronary angioplasty status: Secondary | ICD-10-CM

## 2015-03-24 DIAGNOSIS — I10 Essential (primary) hypertension: Secondary | ICD-10-CM | POA: Diagnosis present

## 2015-03-24 DIAGNOSIS — I209 Angina pectoris, unspecified: Secondary | ICD-10-CM | POA: Diagnosis present

## 2015-03-24 HISTORY — DX: Non-ST elevation (NSTEMI) myocardial infarction: I21.4

## 2015-03-24 HISTORY — DX: Essential (primary) hypertension: I10

## 2015-03-24 HISTORY — DX: Headache: R51

## 2015-03-24 HISTORY — DX: Headache, unspecified: R51.9

## 2015-03-24 HISTORY — DX: Atherosclerotic heart disease of native coronary artery without angina pectoris: I25.10

## 2015-03-24 HISTORY — DX: Hyperlipidemia, unspecified: E78.5

## 2015-03-24 HISTORY — DX: Coronary angioplasty status: Z98.61

## 2015-03-24 HISTORY — DX: Reserved for concepts with insufficient information to code with codable children: IMO0002

## 2015-03-24 HISTORY — DX: Obesity, unspecified: E66.9

## 2015-03-24 HISTORY — PX: CARDIAC CATHETERIZATION: SHX172

## 2015-03-24 SURGERY — LEFT HEART CATH AND CORONARY ANGIOGRAPHY
Anesthesia: LOCAL

## 2015-03-24 MED ORDER — FENTANYL CITRATE (PF) 100 MCG/2ML IJ SOLN
INTRAMUSCULAR | Status: DC | PRN
  Administered 2015-03-24: 50 ug via INTRAVENOUS
  Administered 2015-03-24: 25 ug via INTRAVENOUS

## 2015-03-24 MED ORDER — FUROSEMIDE 20 MG PO TABS: 20.0000 mg | ORAL_TABLET | Freq: Every day | ORAL | Status: DC

## 2015-03-24 MED ORDER — FUROSEMIDE 20 MG PO TABS
20.0000 mg | ORAL_TABLET | Freq: Every day | ORAL | Status: DC
  Administered 2015-03-24: 20 mg via ORAL
  Filled 2015-03-24: qty 1

## 2015-03-24 MED ORDER — ASPIRIN 81 MG PO CHEW: 81.0000 mg | CHEWABLE_TABLET | ORAL | Status: DC

## 2015-03-24 MED ORDER — HEPARIN (PORCINE) IN NACL 2-0.9 UNIT/ML-% IJ SOLN
INTRAMUSCULAR | Status: AC
  Filled 2015-03-24: qty 1500

## 2015-03-24 MED ORDER — SODIUM CHLORIDE 0.9 % WEIGHT BASED INFUSION
3.0000 mL/kg/h | INTRAVENOUS | Status: AC
Start: 2015-03-24 — End: 2015-03-24
  Administered 2015-03-24: 3 mL/kg/h via INTRAVENOUS

## 2015-03-24 MED ORDER — SODIUM CHLORIDE 0.9% FLUSH: 3.0000 mL | INTRAVENOUS | Status: DC | PRN

## 2015-03-24 MED ORDER — ONDANSETRON HCL 4 MG/2ML IJ SOLN: 4.0000 mg | Freq: Four times a day (QID) | INTRAMUSCULAR | Status: DC | PRN

## 2015-03-24 MED ORDER — FENTANYL CITRATE (PF) 100 MCG/2ML IJ SOLN
INTRAMUSCULAR | Status: AC
  Filled 2015-03-24: qty 2

## 2015-03-24 MED ORDER — SODIUM CHLORIDE 0.9% FLUSH: 3.0000 mL | Freq: Two times a day (BID) | INTRAVENOUS | Status: DC

## 2015-03-24 MED ORDER — SODIUM CHLORIDE 0.9 % IV SOLN
250.0000 mL | INTRAVENOUS | Status: DC | PRN
Start: 2015-03-24 — End: 2015-03-24

## 2015-03-24 MED ORDER — MIDAZOLAM HCL 2 MG/2ML IJ SOLN
INTRAMUSCULAR | Status: AC
  Filled 2015-03-24: qty 2

## 2015-03-24 MED ORDER — SODIUM CHLORIDE 0.9 % IV SOLN: 250.0000 mL | INTRAVENOUS | Status: DC | PRN

## 2015-03-24 MED ORDER — HEPARIN (PORCINE) IN NACL 2-0.9 UNIT/ML-% IJ SOLN
INTRAMUSCULAR | Status: DC | PRN
  Administered 2015-03-24: 12:00:00

## 2015-03-24 MED ORDER — MIDAZOLAM HCL 2 MG/2ML IJ SOLN
INTRAMUSCULAR | Status: DC | PRN
  Administered 2015-03-24: 1 mg via INTRAVENOUS
  Administered 2015-03-24: 2 mg via INTRAVENOUS

## 2015-03-24 MED ORDER — HEPARIN SODIUM (PORCINE) 1000 UNIT/ML IJ SOLN
INTRAMUSCULAR | Status: AC
  Filled 2015-03-24: qty 1

## 2015-03-24 MED ORDER — HEPARIN (PORCINE) IN NACL 2-0.9 UNIT/ML-% IJ SOLN
INTRAMUSCULAR | Status: DC | PRN
  Administered 2015-03-24: 12:00:00 via INTRA_ARTERIAL

## 2015-03-24 MED ORDER — SODIUM CHLORIDE 0.9 % WEIGHT BASED INFUSION: 1.0000 mL/kg/h | INTRAVENOUS | Status: DC

## 2015-03-24 MED ORDER — LIDOCAINE HCL (PF) 1 % IJ SOLN
INTRAMUSCULAR | Status: AC
  Filled 2015-03-24: qty 30

## 2015-03-24 MED ORDER — IOHEXOL 350 MG/ML SOLN
INTRAVENOUS | Status: DC | PRN
  Administered 2015-03-24: 100 mL via INTRA_ARTERIAL

## 2015-03-24 MED ORDER — LIDOCAINE HCL (PF) 1 % IJ SOLN
INTRAMUSCULAR | Status: DC | PRN
  Administered 2015-03-24: 2 mL via INTRADERMAL

## 2015-03-24 MED ORDER — HEPARIN SODIUM (PORCINE) 1000 UNIT/ML IJ SOLN
INTRAMUSCULAR | Status: DC | PRN
  Administered 2015-03-24: 6000 [IU] via INTRAVENOUS

## 2015-03-24 MED ORDER — VERAPAMIL HCL 2.5 MG/ML IV SOLN
INTRAVENOUS | Status: AC
  Filled 2015-03-24: qty 2

## 2015-03-24 MED ORDER — ACETAMINOPHEN 325 MG PO TABS: 650.0000 mg | ORAL_TABLET | ORAL | Status: DC | PRN

## 2015-03-24 MED ORDER — HEPARIN (PORCINE) IN NACL 2-0.9 UNIT/ML-% IJ SOLN
INTRAMUSCULAR | Status: DC | PRN
  Administered 2015-03-24: 1500 mL

## 2015-03-24 SURGICAL SUPPLY — 14 items
CATH INFINITI 5FR ANG PIGTAIL (CATHETERS) ×1 IMPLANT
CATH INFINITI JR4 5F (CATHETERS) ×1 IMPLANT
CATH OPTITORQUE TIG 4.0 5F (CATHETERS) ×1 IMPLANT
COVER PRB 48X5XTLSCP FOLD TPE (BAG) IMPLANT
COVER PROBE 5X48 (BAG) ×2
DEVICE RAD COMP TR BAND LRG (VASCULAR PRODUCTS) ×2 IMPLANT
GLIDESHEATH SLEND A-KIT 6F 22G (SHEATH) ×1 IMPLANT
GLIDESHEATH SLEND SS 6F .021 (SHEATH) ×1 IMPLANT
KIT HEART LEFT (KITS) ×2 IMPLANT
PACK CARDIAC CATHETERIZATION (CUSTOM PROCEDURE TRAY) ×2 IMPLANT
SYR MEDRAD MARK V 150ML (SYRINGE) ×2 IMPLANT
TRANSDUCER W/STOPCOCK (MISCELLANEOUS) ×2 IMPLANT
TUBING CIL FLEX 10 FLL-RA (TUBING) ×2 IMPLANT
WIRE SAFE-T 1.5MM-J .035X260CM (WIRE) ×1 IMPLANT

## 2015-03-24 NOTE — Interval H&P Note (Signed)
History and Physical Interval Note:  03/24/2015 11:12 AM  Richard Simpson  has presented today for surgery, with the diagnosis of excertional angina - Class III.   The various methods of treatment have been discussed with the patient and family. After consideration of risks, benefits and other options for treatment, the patient has consented to  Procedure(s): Left Heart Cath and Coronary Angiography (N/A) with Possible Percutaneous Coronary Intervention  as a surgical intervention .  The patient's history has been reviewed, patient examined, no change in status, stable for surgery.  I have reviewed the patient's chart and labs.  Questions were answered to the patient's satisfaction.    Cath Lab Visit (complete for each Cath Lab visit)  Clinical Evaluation Leading to the Procedure:   ACS: No.  Non-ACS:    Anginal Classification: CCS III  Anti-ischemic medical therapy: Maximal Therapy (2 or more classes of medications)  Non-Invasive Test Results: No non-invasive testing performed  Prior CABG: No previous CABG   HARDING, DAVID W  APPROPRIATENESS OF USE: Ischemic Symptoms? CCS III (Marked limitation of ordinary activity) Anti-ischemic Medical Therapy? Maximal Medical Therapy (2 or more classes of medications) Non-invasive Test Results? No non-invasive testing performed Prior CABG? No Previous CABG   Patient Information:   1-2V CAD, no prox LAD  A (7)  Indication: 20; Score: 7   Patient Information:   1-2V-CAD with DS 50-60% With No FFR, No IVUS  I (3)  Indication: 21; Score: 3   Patient Information:   1-2V-CAD with DS 50-60% With FFR  A (7)  Indication: 22; Score: 7   Patient Information:   1-2V-CAD with DS 50-60% With FFR>0.8, IVUS not significant  I (2)  Indication: 23; Score: 2   Patient Information:   3V-CAD without LMCA With Abnormal LV systolic function  A (9)  Indication: 48; Score: 9   Patient Information:   LMCA-CAD  A (9)  Indication:  49; Score: 9   Patient Information:   2V-CAD with prox LAD PCI  A (7)  Indication: 62; Score: 7   Patient Information:   2V-CAD with prox LAD CABG  A (8)  Indication: 62; Score: 8   Patient Information:   3V-CAD without LMCA With Low CAD burden(i.e., 3 focal stenoses, low SYNTAX score) PCI  A (7)  Indication: 63; Score: 7   Patient Information:   3V-CAD without LMCA With Low CAD burden(i.e., 3 focal stenoses, low SYNTAX score) CABG  A (9)  Indication: 63; Score: 9   Patient Information:   3V-CAD without LMCA E06c - Intermediate-high CAD burden (i.e., multiple diffuse lesions, presence of CTO, or high SYNTAX score) PCI  U (4)  Indication: 64; Score: 4   Patient Information:   3V-CAD without LMCA E06c - Intermediate-high CAD burden (i.e., multiple diffuse lesions, presence of CTO, or high SYNTAX score) CABG  A (9)  Indication: 64; Score: 9   Patient Information:   LMCA-CAD With Isolated LMCA stenosis  PCI  U (6)  Indication: 65; Score: 6   Patient Information:   LMCA-CAD With Isolated LMCA stenosis  CABG  A (9)  Indication: 65; Score: 9   Patient Information:   LMCA-CAD Additional CAD, low CAD burden (i.e., 1- to 2-vessel additional involvement, low SYNTAX score) PCI  U (5)  Indication: 66; Score: 5   Patient Information:   LMCA-CAD Additional CAD, low CAD burden (i.e., 1- to 2-vessel additional involvement, low SYNTAX score) CABG  A (9)  Indication: 66; Score: 9  Patient Information:   LMCA-CAD Additional CAD, intermediate-high CAD burden (i.e., 3-vessel involvement, presence of CTO, or high SYNTAX score) PCI  I (3)  Indication: 67; Score: 3   Patient Information:   LMCA-CAD Additional CAD, intermediate-high CAD burden (i.e., 3-vessel involvement, presence of CTO, or high SYNTAX score) CABG  A (9)  Indication: 67; Score: 9   HARDING, DAVID W, M.D., M.S. Interventional Cardiologist   Pager #  [phone] Phone # [phone] [address] [place], [place] [address]

## 2015-03-24 NOTE — H&P (View-Only) (Signed)
03/20/2015 Richard Simpson   December 27, 1970  [id number]  Primary Physician Purvis Kilts, MD Primary Cardiologist: Dr. Claiborne Billings   Reason for Visit/CC: Exertional CP and Dyspnea.  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   He initially presented in 2015 with CP. He underwent cardiac catheterization October 2015 and was felt to have nonobstructive CAD at that time. He developed severe chest pain the evening of July 04, 2014 leading to his [facility] hospitalization on the morning of June 10. He was seen in consultation by Dr. Harl Bowie and transferred for urgent cardiac catheterization which was done by Dr. Claiborne Billings. This revealed a subtotal 99+ percent ostial LAD stenosis followed by proximal 70% and 50% stenoses. Initial ejection fraction was 45% with anterolateral hypocontractility. He underwent successful intervention with insertion of Synergy DES 3.520 mm stent placed from the LAD ostium to cover all 3 lesions.He did remarkably well post PCI and symptoms resolved.   Over the last 2 weeks he has noticed recurrence in symptoms, although not as severe as when he had his MI in June. He notes exertional chest tightness and DOE. His CP dose not radiate. He denies associated diaphoresis, n/v, syncope/ near syncope. Symptoms resolved spontaneously after 5-10 minutes of rest. He denies any resting symptoms. He has not required use of SL NTG. He reports that he has been fully compliant with his medications, including DAPT with ASA and Brlinta. He has also been fully compliant with lipitor, metoprolol, losartan and amlodipine. His EKG today shows NSR with nonspecific t wave abnormalities. He is currently asymptomatic in clinic today. HR is 67 bpm. BP is soft but stable in the upper 0000000 systolic.    Current Outpatient Prescriptions  Medication Sig Dispense Refill  .  amLODipine (NORVASC) 5 MG tablet Take 1 tablet (5 mg total) by mouth daily. 30 tablet 6  . aspirin EC 81 MG tablet Take 1 tablet (81 mg total) by mouth daily. 30 tablet 3  . atorvastatin (LIPITOR) 80 MG tablet TAKE ONE TABLET BY MOUTH ONCE DAILY. 30 tablet 6  . clonazePAM (KLONOPIN) 0.5 MG tablet Take 0.5 mg by mouth 2 (two) times daily as needed for anxiety.    . InFLIXimab (REMICADE IV) Inject into the vein as directed. Every 7 weeks    . loratadine (CLARITIN) 10 MG tablet Take 10 mg by mouth daily.     Marland Kitchen losartan (COZAAR) 25 MG tablet Take 1 tablet (25 mg total) by mouth daily. 90 tablet 3  . metoprolol tartrate (LOPRESSOR) 25 MG tablet TAKE (1) TABLET BY MOUTH TWICE DAILY. 60 tablet 4  . nitroGLYCERIN (NITROSTAT) 0.4 MG SL tablet Place 0.4 mg under the tongue every 5 (five) minutes as needed for chest pain (x 3 doses).    . ranitidine (ZANTAC) 150 MG capsule Take 150 mg by mouth 2 (two) times daily.    . ticagrelor (BRILINTA) 90 MG TABS tablet Take 1 tablet (90 mg total) by mouth 2 (two) times daily. 60 tablet 6  . traMADol (ULTRAM) 50 MG tablet Take 50 mg by mouth every 6 (six) hours as needed.     No current facility-administered medications for this visit.    Allergies  Allergen Reactions  . Lisinopril Cough  . Prednisone Other (See Comments)    Makes angry    Social History   Social History  . Marital  Status: Married    Spouse Name: [name]  . Number of Children: 2  . Years of Education: 12th grade   Occupational History  . Engineer, structural   .     Social History Main Topics  . Smoking status: Former Smoker -- 0.25 packs/day    Types: Cigarettes    Quit date: 09/04/2003  . Smokeless tobacco: Current User    Types: Chew  . Alcohol Use: 0.0 oz/week    1-2 Cans of beer per week     Comment: rare  . Drug Use: No  . Sexual Activity: Yes   Other Topics Concern  . Not on file   Social History Narrative   Patient lives at home with his wife [name]).    Patient works  full time Dentist..   Education high school.   Right handed.   Caffeine one cup of coffee daily and one soda.     Review of Systems: General: negative for chills, fever, night sweats or weight changes.  Cardiovascular: negative for chest pain, dyspnea on exertion, edema, orthopnea, palpitations, paroxysmal nocturnal dyspnea or shortness of breath Dermatological: negative for rash Respiratory: negative for cough or wheezing Urologic: negative for hematuria Abdominal: negative for nausea, vomiting, diarrhea, bright red blood per rectum, melena, or hematemesis Neurologic: negative for visual changes, syncope, or dizziness All other systems reviewed and are otherwise negative except as noted above.    Blood pressure 98/72, pulse 67, height 6\' 1"  (1.854 m), weight 256 lb 12.8 oz (116.484 kg).  General appearance: alert, cooperative, no distress and moderately obese Neck: no carotid bruit and no JVD Lungs: clear to auscultation bilaterally Heart: regular rate and rhythm, S1, S2 normal, no murmur, click, rub or gallop Extremities: no LEE Pulses: 2+ and symmetric Skin: warm and dry Neurologic: Grossly normal  EKG NSR with nonspecific t wave abnormalities.  ASSESSMENT AND PLAN:   1. CAD/ Exertional Angina: Patient with known history of CAD s/p DES to proximal to mid LAD less than 1 year ago. He has had exertional chest pain, dyspnea/ decreased exercise tolerance recently but no resting symptoms. EKG shows NSR with nonspecific T wave abnormalities. He is currently CP fee and has been fully compliant with his meds including DAPT. His BP limits initiation of a long acting nitrate with systolic BP in the 0000000. He is to continue ASA, Brilinta, Lipitor, metoprolol and amlodipine. Given his symptoms and RA, we have elected to set him up for outpatient LHC next week to assess stent patency. Will try to arrange with Dr. Claiborne Billings, if possible. For now, continue medical therapy. Patient  instructed to avoid physical activity as much as possible until his cath. I have reviewed all of the potential associated risk of procedure. Patient agrees to proceed. We will check a CBC anc BMP today. He has SL NTG at home. We reviewed proper use and when to seek emergent evaluation. He verbalized understanding.    Lyda Jester PA-C 03/20/2015 5:24 PM

## 2015-03-24 NOTE — Discharge Instructions (Signed)
Radial Site Care °Refer to this sheet in the next few weeks. These instructions provide you with information about caring for yourself after your procedure. Your health care provider may also give you more specific instructions. Your treatment has been planned according to current medical practices, but problems sometimes occur. Call your health care provider if you have any problems or questions after your procedure. °WHAT TO EXPECT AFTER THE PROCEDURE °After your procedure, it is typical to have the following: °· Bruising at the radial site that usually fades within 1-2 weeks. °· Blood collecting in the tissue (hematoma) that may be painful to the touch. It should usually decrease in size and tenderness within 1-2 weeks. °HOME CARE INSTRUCTIONS °· Take medicines only as directed by your health care provider. °· You may shower 24-48 hours after the procedure or as directed by your health care provider. Remove the bandage (dressing) and gently wash the site with plain soap and water. Pat the area dry with a clean towel. Do not rub the site, because this may cause bleeding. °· Do not take baths, swim, or use a hot tub until your health care provider approves. °· Check your insertion site every day for redness, swelling, or drainage. °· Do not apply powder or lotion to the site. °· Do not flex or bend the affected arm for 24 hours or as directed by your health care provider. °· Do not push or pull heavy objects with the affected arm for 24 hours or as directed by your health care provider. °· Do not lift over 10 lb (4.5 kg) for 5 days after your procedure or as directed by your health care provider. °· Ask your health care provider when it is okay to: °¨ Return to work or school. °¨ Resume usual physical activities or sports. °¨ Resume sexual activity. °· Do not drive home if you are discharged the same day as the procedure. Have someone else drive you. °· You may drive 24 hours after the procedure unless otherwise  instructed by your health care provider. °· Do not operate machinery or power tools for 24 hours after the procedure. °· If your procedure was done as an outpatient procedure, which means that you went home the same day as your procedure, a responsible adult should be with you for the first 24 hours after you arrive home. °· Keep all follow-up visits as directed by your health care provider. This is important. °SEEK MEDICAL CARE IF: °· You have a fever. °· You have chills. °· You have increased bleeding from the radial site. Hold pressure on the site. °SEEK IMMEDIATE MEDICAL CARE IF: °· You have unusual pain at the radial site. °· You have redness, warmth, or swelling at the radial site. °· You have drainage (other than a small amount of blood on the dressing) from the radial site. °· The radial site is bleeding, and the bleeding does not stop after 30 minutes of holding steady pressure on the site. °· Your arm or hand becomes pale, cool, tingly, or numb. °  °This information is not intended to replace advice given to you by your health care provider. Make sure you discuss any questions you have with your health care provider. °  °Document Released: 02/13/2010 Document Revised: 02/01/2014 Document Reviewed: 07/30/2013 °Elsevier Interactive Patient Education ©2016 [employer] ° °

## 2015-04-08 ENCOUNTER — Encounter: Payer: Self-pay | Admitting: Physician Assistant

## 2015-04-08 ENCOUNTER — Telehealth: Payer: Self-pay | Admitting: Cardiovascular Disease

## 2015-04-08 ENCOUNTER — Ambulatory Visit (INDEPENDENT_AMBULATORY_CARE_PROVIDER_SITE_OTHER): Payer: PRIVATE HEALTH INSURANCE | Admitting: Physician Assistant

## 2015-04-08 VITALS — BP 110/80 | HR 67 | Ht 73.0 in | Wt 263.0 lb

## 2015-04-08 DIAGNOSIS — Z5181 Encounter for therapeutic drug level monitoring: Secondary | ICD-10-CM | POA: Diagnosis not present

## 2015-04-08 DIAGNOSIS — R0600 Dyspnea, unspecified: Secondary | ICD-10-CM

## 2015-04-08 DIAGNOSIS — E785 Hyperlipidemia, unspecified: Secondary | ICD-10-CM

## 2015-04-08 DIAGNOSIS — R0789 Other chest pain: Secondary | ICD-10-CM | POA: Diagnosis not present

## 2015-04-08 DIAGNOSIS — I10 Essential (primary) hypertension: Secondary | ICD-10-CM

## 2015-04-08 DIAGNOSIS — I2510 Atherosclerotic heart disease of native coronary artery without angina pectoris: Secondary | ICD-10-CM

## 2015-04-08 DIAGNOSIS — E669 Obesity, unspecified: Secondary | ICD-10-CM

## 2015-04-08 DIAGNOSIS — Z9861 Coronary angioplasty status: Secondary | ICD-10-CM

## 2015-04-08 LAB — BASIC METABOLIC PANEL
BUN: 15 mg/dL (ref 7–25)
CHLORIDE: 105 mmol/L (ref 98–110)
CO2: 25 mmol/L (ref 20–31)
CREATININE: 0.94 mg/dL (ref 0.60–1.35)
Calcium: 9.5 mg/dL (ref 8.6–10.3)
Glucose, Bld: 109 mg/dL — ABNORMAL HIGH (ref 65–99)
POTASSIUM: 4.2 mmol/L (ref 3.5–5.3)
SODIUM: 139 mmol/L (ref 135–146)

## 2015-04-08 NOTE — Patient Instructions (Signed)
Your physician recommends that you return for lab work TODAY.  Tarri Fuller, PA-C, recommends that you schedule a follow-up appointment in 3 months with Dr Claiborne Billings.  If you need a refill on your cardiac medications before your next appointment, please call your pharmacy.

## 2015-04-08 NOTE — Telephone Encounter (Signed)
Richard Simpson is calling because she is wanting to get clarification about Richard Simpson visit today . Is wanting to have Dr. Claiborne Billings look at the cath that was done about 3wk ago.  Please call

## 2015-04-08 NOTE — Telephone Encounter (Signed)
Returned call to patient's wife and communicated MD info. She is still VERY concerned about this 35% lesion and is afraid it will grow - was 10% in June and grew to 35%. She states "how do we prevent it from getting to 99%" referencing a previous lesion requiring a stent.   Informed wife that I am unable to answer all of her questions and alleviate her concerns, despite Dr. Claiborne Billings having provided this info, she would like to see Dr. Claiborne Billings prior to appointment in June.   Offered appt 3/20 or May 2016. She decided to bring him in on 3/20.

## 2015-04-08 NOTE — Telephone Encounter (Signed)
Patient was seen in office by B. Samara Snide, [place] today 04/08/15  Wife is very concerned that they were told patient has a 35% blockage on his most recent cath and this has been a change since June 2016 by 10%.   Wife is concerned as she states they were told there were no blockages after his most recent cath.   She reports he was exercising routinely, eating better, taking all meds appropriately - now cannot exercise as well - has shortness of breath/"elephant on his chest" per wife  His wife is VERY anxious and concerned. She would like Dr. Evette Georges opinion on the cath and treatment plan vs him getting a stent at most recent cath  Wife would like Dr. Claiborne Billings to review his situation.

## 2015-04-08 NOTE — Telephone Encounter (Signed)
35% is not functionally obstructive and should not cause ischemia;  medical therapy

## 2015-04-08 NOTE — Progress Notes (Signed)
Patient ID: Richard Simpson, male   DOB: 1970-08-02, 45 y.o.   MRN: [id number]    Date:  04/08/2015   ID:  Richard Simpson, DOB [birth year], MRN [id number]  PCP:  Richard Kilts, MD  Primary Cardiologist:  Claiborne Billings   Chief Complaint  Patient presents with  . Chest Pain    patient complains of chest discomfort with SOB. increases with exertion.  Had heart cath done by Dr Ellyn Hack about 2 weeks ago. Patient has not taken any tramadol recently.     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 He developed severe chest pain the evening of July 04, 2014 leading to his [facility] hospitalization on the morning of June 10. He was seen in consultation by Dr. Harl Bowie and transferred for urgent cardiac catheterization which was done by me. This revealed a subtotal 99+ percent ostial LAD stenosis followed by proximal 70% and 50% stenoses. Initial ejection fraction was 45% with anterolateral hypocontractility. He underwent successful intervention with insertion of Synergy DES 3.520 mm stent placed from the LAD ostium to cover all 3 lesions.  Patient was seen on 03/20/2015 with exertional chest tightness.  Subsequently underwent left heart catheterization 11/21/2015 revealing a patent stent in proximal LAD with only 35% stenosis just beyond the D1. No cardiac cause for his chest pain was identified.  He had a mildly elevated LVEDP was started on 20 mg of Lasix daily.  He is now here for follow-up.  He reports continued DOE wen working around [employer].  His weight is up on our scale but he is wearing long boots and a jacket.    The patient currently denies nausea, vomiting, fever, chest pain, shortness of breath at rest, orthopnea, dizziness, PND, cough, congestion, abdominal pain, hematochezia, melena, lower extremity edema, claudication.  Wt Readings from Last 3  Encounters:  04/08/15 263 lb (119.296 kg)  03/24/15 255 lb (115.667 kg)  03/20/15 256 lb 12.8 oz (116.484 kg)     Past Medical History  Diagnosis Date  . NSTEMI (non-ST elevated myocardial infarction) ([place]) 06/2014  . CAD S/P percutaneous coronary angioplasty 10/2013    a. 10/2013 Cath: mod LAD dzs->nl FFR-> medically managed;  b. 06/2014 NSTEMI/PCi: LAD 99ost, 70p, 59m (entire area covered with a 3.5x20 Synergy DES), LCX nl, RCA 59m, 10d, EF 45-50%.   . Ischemic cardiomyopathy - Resolved 06/2014    a. 06/2014 EF 45-50% by LV gram;; b. Echo July 2016: EF 60-65%. No RWMA. Gr 1 DD   . Essential hypertension   . Dyslipidemia, goal LDL below 70   . Obesity (BMI 30-39.9) 10/29/2013  . Primary snoring 10/29/2013  . RLS (restless legs syndrome) 10/29/2013  . Rheumatoid arthritis ([place]) 09/23/2013  . 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 Headache     Current Outpatient Prescriptions  Medication Sig Dispense Refill  . amLODipine (NORVASC) 5 MG tablet Take 1 tablet (5 mg total) by mouth daily. 30 tablet 6  . aspirin EC 81 MG tablet Take 1 tablet (81 mg total) by mouth daily. 30 tablet 3  . atorvastatin (LIPITOR) 80 MG tablet TAKE ONE TABLET BY MOUTH ONCE DAILY. 30 tablet 6  . clonazePAM (KLONOPIN) 0.5 MG tablet Take 0.5 mg by mouth 2 (two) times daily as needed for  anxiety.    . furosemide (LASIX) 20 MG tablet Take 1 tablet (20 mg total) by mouth daily. 30 tablet 6  . InFLIXimab (REMICADE IV) Inject into the vein as directed. Every 7 weeks    . loratadine (CLARITIN) 10 MG tablet Take 10 mg by mouth daily.     Marland Kitchen losartan (COZAAR) 25 MG tablet Take 1 tablet (25 mg total) by mouth daily. 90 tablet 3  . metoprolol tartrate (LOPRESSOR) 25 MG tablet TAKE (1) TABLET BY MOUTH TWICE DAILY. 60 tablet 4  . nitroGLYCERIN (NITROSTAT) 0.4 MG SL tablet Place 0.4 mg under the tongue every 5 (five) minutes as needed for chest pain (x 3 doses).      . ranitidine (ZANTAC) 150 MG capsule Take 150 mg by mouth 2 (two) times daily.    . ticagrelor (BRILINTA) 90 MG TABS tablet Take 1 tablet (90 mg total) by mouth 2 (two) times daily. 60 tablet 6  . traMADol (ULTRAM) 50 MG tablet Take 50 mg by mouth every 6 (six) hours as needed for moderate pain.      No current facility-administered medications for this visit.    Allergies:    Allergies  Allergen Reactions  . Lisinopril Cough  . Prednisone Other (See Comments)    Makes angry    Social History:  The patient  reports that he quit smoking about 11 years ago. His smoking use included Cigarettes. He smoked 0.25 packs per day. His smokeless tobacco use includes Chew. He reports that he drinks alcohol. He reports that he does not use illicit drugs.   Family history:   Family History  Problem Relation Age of Onset  . Arthritis Paternal Uncle   . Cancer Maternal Grandfather   . Anesthesia problems Neg Hx   . Hypotension Neg Hx   . Malignant hyperthermia Neg Hx   . Pseudochol deficiency Neg Hx   . Heart attack Neg Hx   . Stroke Neg Hx   . Hypertension Mother   . Hypertension Maternal Grandmother   . Diabetes    . Diabetes Mother   . Diabetes Maternal Grandmother   . Diabetes    . Diabetes Paternal Grandfather    Lipid Panel     Component Value Date/Time   CHOL 114 08/06/2014 0746   TRIG 116 08/06/2014 0746   HDL 28* 08/06/2014 0746   LDLCALC 63 08/06/2014 0746     ROS:  Please see the history of present illness.  All other systems reviewed and negative.   PHYSICAL EXAM: VS:  BP 110/80 mmHg  Pulse 67  Ht 6\' 1"  (1.854 m)  Wt 263 lb (119.296 kg)  BMI 34.71 kg/m2 Overweight, well developed, in no acute distress HEENT: Pupils are equal round react to light accommodation extraocular movements are intact.  Neck: no JVDNo cervical lymphadenopathy. Cardiac: Regular rate and rhythm without murmurs rubs or gallops. Lungs:  clear to auscultation bilaterally, no wheezing, rhonchi  or rales Abd: soft, nontender, positive bowel sounds all quadrants, no hepatosplenomegaly Ext: no lower extremity edema.  2+ radial and dorsalis pedis pulses. Skin: warm and dry Neuro:  Grossly normal  EKG: Normal sinus rhythm rate 67 bpm  ASSESSMENT AND PLAN:  Problem List Items Addressed This Visit    Obesity (BMI 30-39.9) (Chronic)   Essential hypertension (Chronic)   Dyspnea   Dyslipidemia, goal LDL below 70   CAD S/P percutaneous coronary angioplasty (Chronic)    Other Visit Diagnoses    Other chest pain    -  Primary    Relevant Orders    EKG 12-Lead    Therapeutic drug monitoring        Relevant Orders    Basic metabolic panel       Richard Simpson continues to complain of dyspnea with exertion. He just had heart catheterization revealing patent stent in the LAD and a 35% stenosis just beyond the D1. His other coronary arteries were otherwise clear. Chest x-ray showed nothing acute but did show mild hypoinflation. He was started on Lasix 20 mg daily because his LVEDP was increased. He currently appears euvolemic. His blood pressures well-controlled. Will check a basic metabolic panel since she's been on the Lasix. Does not appear dehydrated however, and is not complaining of any positional dizziness.  Recommended he resume his exercise routine work back up to the point he was at. He says he is eating healthy but I also reemphasized low carb diet.  I think he is extremely nervous when he started having the dyspnea as that was a major symptom he was having before. His cholesterol last year looked good with the exception of a low HDL. Follow-up in 3 months with Dr. Claiborne Billings

## 2015-04-09 ENCOUNTER — Other Ambulatory Visit: Payer: Self-pay | Admitting: Interventional Cardiology

## 2015-04-09 NOTE — Telephone Encounter (Signed)
REFILL 

## 2015-04-14 ENCOUNTER — Ambulatory Visit (INDEPENDENT_AMBULATORY_CARE_PROVIDER_SITE_OTHER): Payer: PRIVATE HEALTH INSURANCE | Admitting: Cardiovascular Disease

## 2015-04-14 VITALS — BP 108/82 | HR 69 | Ht 73.0 in | Wt 256.3 lb

## 2015-04-14 DIAGNOSIS — I2510 Atherosclerotic heart disease of native coronary artery without angina pectoris: Secondary | ICD-10-CM

## 2015-04-14 DIAGNOSIS — E785 Hyperlipidemia, unspecified: Secondary | ICD-10-CM | POA: Diagnosis not present

## 2015-04-14 DIAGNOSIS — Z9861 Coronary angioplasty status: Secondary | ICD-10-CM

## 2015-04-14 DIAGNOSIS — I10 Essential (primary) hypertension: Secondary | ICD-10-CM | POA: Diagnosis not present

## 2015-04-14 DIAGNOSIS — R0600 Dyspnea, unspecified: Secondary | ICD-10-CM

## 2015-04-14 DIAGNOSIS — I25119 Atherosclerotic heart disease of native coronary artery with unspecified angina pectoris: Secondary | ICD-10-CM

## 2015-04-14 DIAGNOSIS — E669 Obesity, unspecified: Secondary | ICD-10-CM

## 2015-04-14 DIAGNOSIS — R5383 Other fatigue: Secondary | ICD-10-CM

## 2015-04-14 DIAGNOSIS — I252 Old myocardial infarction: Secondary | ICD-10-CM

## 2015-04-14 MED ORDER — LOSARTAN POTASSIUM 25 MG PO TABS: ORAL_TABLET | ORAL | Status: DC

## 2015-04-14 NOTE — Telephone Encounter (Signed)
Med RX, proper diet and lifestyle; he is on atorvastatin 80 mg which should reduce potential for progression and induce plaque stability

## 2015-04-14 NOTE — Patient Instructions (Addendum)
Keep appointment  June 2017 WITH  DR Claiborne Billings.  Your physician has requested that you have an echocardiogram [place] [facility]. Echocardiography is a painless test that uses sound waves to create images of your heart. It provides your doctor with information about the size and shape of your heart and how well your heart's chambers and valves are working. This procedure takes approximately one hour. There are no restrictions for this procedure.  LABS--FASTING  CBC,CMP,TSH,LIPID  INCREASE LOSARTAN 25 MG IN THE MORNING ,12.5   (  1/2  TABLETS)  MG IN THE EVENING   If you need a refill on your cardiac medications before your next appointment, please call your pharmacy.

## 2015-04-15 ENCOUNTER — Encounter: Payer: Self-pay | Admitting: Cardiovascular Disease

## 2015-04-15 NOTE — Progress Notes (Signed)
Patient ID: Richard Simpson, male   DOB: 09-14-1970, 45 y.o.   MRN: [id number]     HPI: Richard Simpson is a 45 y.o. male who presents to the office today for a 3 month follow-up cardiology evaluation.  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 He developed severe chest pain the evening of July 04, 2014 leading to his [facility] hospitalization on the morning of June 10.  He was seen in consultation by Dr. Harl Bowie and transferred for urgent cardiac catheterization which was done by me. This revealed a subtotal 99+ percent ostial LAD stenosis followed by proximal 70% and 50% stenoses.  Initial ejection fraction was 45% with anterolateral hypocontractility.  He underwent successful intervention with insertion of Synergy DES 3.520 mm stent placed from the LAD ostium to cover all 3 lesions.  Subsequently, he noticed marked benefit in his previous exertional shortness of breath symptomatology.  He was seen  In follow-up by Murray Hodgkins, NP and due to and ACE-induced cough.  He was started on losartan in place of lisinopril. He has a history of arthritic disease and in the past had been treated for probable psoriatic arthritis but he is now felt to have rheumatoid arthritis.  He underwent an echo Doppler study on 08/09/2014 which showed mild focal basal hypertrophy of the septum.  There are no wall motion abnormalities.  Ejection fraction was 60-65%.  There was grade 1 diastolic dysfunction.  The mitral valve was mildly calcified at its annulus and there was trivial aortic insufficiency.  He participated in cardiac rehabilitation last year.  Since I last saw him, he had continued to do well until last month when he began to notice shortness of breath and a sensation of chest tightness.  He underwent repeat cardiac catheterization on 03/24/2015 by Dr. Ellyn Hack and was found to have a widely patent stent at the ostium and  proximal LAD segment.  There was mild 35% narrowing after the takeoff of the first diagonal vessel.  His circumflex and RCA were considered to be normal.  His LVEDP was mildly increased.  It was felt that there was no new evidence for a new CAD lesion to explain his symptoms.  It was felt that possibly his symptoms were related to microvascular disease versus diastolic dysfunction.  Subsequently, he was started on Lasix 20 mg.  He also has been taking amlodipine 5 mg, losartan 25 mg, metoprolol tartrate 25 mg twice a day.  He continues to be on aspirin 81 mg and Brilinta 90 mg twice a day.  His wife was very concerned about this 35% LAD stenosis.  He presents for follow-up evaluation.  Past Medical History  Diagnosis Date  . NSTEMI (non-ST elevated myocardial infarction) ([place]) 06/2014  . CAD S/P percutaneous coronary angioplasty 10/2013    a. 10/2013 Cath: mod LAD dzs->nl FFR-> medically managed;  b. 06/2014 NSTEMI/PCi: LAD 99ost, 70p, 60m(entire area covered with a 3.5x20 Synergy DES), LCX nl, RCA 275m10d, EF 45-50%.   . Ischemic cardiomyopathy - Resolved 06/2014    a. 06/2014 EF 45-50% by LV gram;; b. Echo July 2016: EF 60-65%. No RWMA. Gr 1 DD   . Essential hypertension   . Dyslipidemia, goal LDL below 70   . Obesity (BMI 30-39.9) 10/29/2013  . Primary snoring 10/29/2013  . RLS (restless legs syndrome) 10/29/2013  . Rheumatoid arthritis ([facility]/30/2015  . DEGENERATIVE DISC DISEASE, THORACIC SPINE  09/16/2009    Qualifier: Diagnosis of  By: [name] MD, [name]    . Headache     Past Surgical History  Procedure Laterality Date  . Gallbladder surgery    . Throat surgery      nodules removed from throat as child  . Cholecystectomy      10 yrs ago-[facility]  . Mass excision  09/09/2010    Procedure: EXCISION MASS;  Surgeon: Arther Abbott, MD;  Location: [facility] ORS;  Service: Orthopedics;  Laterality: Left;  Excision Mass Left Long Finger  . Colonoscopy N/A 03/23/2013    Procedure: COLONOSCOPY;   Surgeon: Daneil Dolin, MD;  Location: [facility] ENDO SUITE;  Service: Endoscopy;  Laterality: N/A;  2:15-moved to 1030 Staff notified pt  . Left heart catheterization with coronary angiogram N/A 11/23/2013    Procedure: LEFT HEART CATHETERIZATION WITH CORONARY ANGIOGRAM;  Surgeon: Jettie Booze, MD;  Location: [facility] CATH LAB;  Service: Cardiovascular;  Laterality: N/A;  . Fractional flow reserve wire  11/23/2013    Procedure: FRACTIONAL FLOW RESERVE WIRE;  Surgeon: Jettie Booze, MD;  Location: [facility] CATH LAB;  Service: Cardiovascular;;  . Cardiac catheterization N/A 07/05/2014    Procedure: Left Heart Cath and Coronary Angiography;  Surgeon: Troy Sine, MD;  Location: [place] CV LAB;  Service: Cardiovascular;  NSTEMI --> Ostial LAd 99%, prox 80% & early mid 50% --> PCI  . Cardiac catheterization  07/05/2014    Procedure: Coronary Stent Intervention;  Surgeon: Troy Sine, MD;  Location: [place] CV LAB;  Service: Cardiovascular; Ostial-prox LAD tandem 99, 80 & 50% --> Synergy DES 3.5 x 20 (3.74)  . Transthoracic echocardiogram   July 2016:     EF 60-65%. No RWMA. Gr 1 DD  . Cardiac catheterization N/A 03/24/2015    Procedure: Left Heart Cath and Coronary Angiography;  Surgeon: Leonie Man, MD;  Location: [place] CV LAB;  Service: Cardiovascular;  Laterality: N/A;    Allergies  Allergen Reactions  . Lisinopril Cough  . Prednisone Other (See Comments)    Makes angry    Current Outpatient Prescriptions  Medication Sig Dispense Refill  . amLODipine (NORVASC) 5 MG tablet TAKE 1 TABLET BY MOUTH ONCE A DAY. 30 tablet 5  . aspirin EC 81 MG tablet Take 1 tablet (81 mg total) by mouth daily. 30 tablet 3  . atorvastatin (LIPITOR) 80 MG tablet TAKE ONE TABLET BY MOUTH ONCE DAILY. 30 tablet 6  . clonazePAM (KLONOPIN) 0.5 MG tablet Take 0.5 mg by mouth 2 (two) times daily as needed for anxiety.    . furosemide (LASIX) 20 MG tablet Take 1 tablet (20 mg total) by mouth daily. 30  tablet 6  . InFLIXimab (REMICADE IV) Inject into the vein as directed. Every 7 weeks    . loratadine (CLARITIN) 10 MG tablet Take 10 mg by mouth daily.     . metoprolol tartrate (LOPRESSOR) 25 MG tablet TAKE (1) TABLET BY MOUTH TWICE DAILY. 60 tablet 4  . nitroGLYCERIN (NITROSTAT) 0.4 MG SL tablet Place 0.4 mg under the tongue every 5 (five) minutes as needed for chest pain (x 3 doses).    . ranitidine (ZANTAC) 150 MG capsule Take 150 mg by mouth 2 (two) times daily.    . ticagrelor (BRILINTA) 90 MG TABS tablet Take 1 tablet (90 mg total) by mouth 2 (two) times daily. 60 tablet 6  . traMADol (ULTRAM) 50 MG tablet Take 50 mg by mouth every 6 (six) hours as  needed for moderate pain.     Marland Kitchen losartan (COZAAR) 25 MG tablet Take 25 mg  In the morning , and 12.5. mg in the evening 75 tablet 4   No current facility-administered medications for this visit.    Social History   Social History  . Marital Status: Married    Spouse Name: [name]  . Number of Children: 2  . Years of Education: 12th grade   Occupational History  . Engineer, structural   .     Social History Main Topics  . Smoking status: Former Smoker -- 0.25 packs/day    Types: Cigarettes    Quit date: 09/04/2003  . Smokeless tobacco: Current User    Types: Chew  . Alcohol Use: 0.0 oz/week    1-2 Cans of beer per week     Comment: rare  . Drug Use: No  . Sexual Activity: Yes   Other Topics Concern  . Not on file   Social History Narrative   Patient lives at home with his wife [name]).    Patient works full time Dentist..   Education high school.   Right handed.   Caffeine one cup of coffee daily and one soda.    Family History  Problem Relation Age of Onset  . Arthritis Paternal Uncle   . Cancer Maternal Grandfather   . Anesthesia problems Neg Hx   . Hypotension Neg Hx   . Malignant hyperthermia Neg Hx   . Pseudochol deficiency Neg Hx   . Heart attack Neg Hx   . Stroke Neg Hx   . Hypertension Mother   .  Hypertension Maternal Grandmother   . Diabetes    . Diabetes Mother   . Diabetes Maternal Grandmother   . Diabetes    . Diabetes Paternal Grandfather     ROS General: Negative; No fevers, chills, or night sweats HEENT: Negative; No changes in vision or hearing, sinus congestion, difficulty swallowing Pulmonary: Negative; No cough, wheezing, shortness of breath, hemoptysis Cardiovascular: See HPI: No chest pain, presyncope, syncope, palpatations GI: Negative; No nausea, vomiting, diarrhea, or abdominal pain GU: Negative; No dysuria, hematuria, or difficulty voiding Musculoskeletal: Negative; no myalgias, joint pain, or weakness Hematologic: Negative; no easy bruising, bleeding Endocrine: Negative; no heat/cold intolerance; no diabetes, Neuro: Negative; no changes in balance, headaches Skin: Negative; No rashes or skin lesions Psychiatric: Negative; No behavioral problems, depression Sleep: Negative; No snoring,  daytime sleepiness, hypersomnolence, bruxism, restless legs, hypnogognic hallucinations. Other comprehensive 14 point system review is negative   Physical Exam BP 108/82 mmHg  Pulse 69  Ht '6\' 1"'  (1.854 m)  Wt 256 lb 4.8 oz (116.257 kg)  BMI 33.82 kg/m2   Repeat blood pressure by me 118/80  Wt Readings from Last 3 Encounters:  04/14/15 256 lb 4.8 oz (116.257 kg)  04/08/15 263 lb (119.296 kg)  03/24/15 255 lb (115.667 kg)   General: Alert, oriented, no distress.  Skin: normal turgor, no rashes, warm and dry HEENT: Normocephalic, atraumatic. Pupils equal round and reactive to light; sclera anicteric; extraocular muscles intact, No lid lag; Nose without nasal septal hypertrophy; Mouth/Parynx benign; Mallinpatti scale 3 Neck:  Thick neck;No JVD, no carotid bruits; normal carotid upstroke Lungs: clear to ausculatation and percussion bilaterally; no wheezing or rales, normal inspiratory and expiratory effort Chest wall: without tenderness to palpitation Heart: PMI not  displaced, RRR, s1 s2 normal,  Faint 1/6 systolic murmur, No diastolic murmur, no rubs, gallops, thrills, or heaves Abdomen: soft, nontender; no  hepatosplenomehaly, BS+; abdominal aorta nontender and not dilated by palpation. Back: no CVA tenderness Pulses: 2+  Musculoskeletal: full range of motion, normal strength, no joint deformities Extremities: Pulses 2+, no clubbing cyanosis or edema, Homan's sign negative  Neurologic: grossly nonfocal; Cranial nerves grossly wnl Psychologic: Normal mood and affect  ECG (independently read by me): Normal sinus rhythm at 69 bpm.  No ectopy.  QTc interval 424 ms  October 2016 ECG (independently read by me): Normal sinus rhythm at 65 bpm.  Nondiagnostic T changes.  No ECG evidence of infarction.  July 2016 ECG (independently read by me):  Normal sinus rhythm at 65 bpm.  Nonspecific T-wave abnormality.  LABS:  BMP Latest Ref Rng 04/08/2015 03/20/2015 03/19/2015  Glucose 65 - 99 mg/dL 109(H) 93 92  BUN 7 - 25 mg/dL '15 19 17  ' Creatinine 0.60 - 1.35 mg/dL 0.94 0.90 0.94  BUN/Creat Ratio 9 - 20 - - -  Sodium 135 - 146 mmol/L 139 138 139  Potassium 3.5 - 5.3 mmol/L 4.2 4.3 4.3  Chloride 98 - 110 mmol/L 105 104 105  CO2 20 - 31 mmol/L '25 23 28  ' Calcium 8.6 - 10.3 mg/dL 9.5 9.5 9.7    Hepatic Function Latest Ref Rng 08/06/2014 07/04/2014 09/23/2013  Total Protein 6.0 - 8.5 g/dL 6.7 6.8 6.9  Albumin 3.5 - 5.5 g/dL 4.3 4.2 3.9  AST 0 - 40 IU/L 36 35 30  ALT 0 - 44 IU/L 51(H) 51 46  Alk Phosphatase 39 - 117 IU/L 82 55 70  Total Bilirubin 0.0 - 1.2 mg/dL 0.9 1.0 0.6  Bilirubin, Direct 0.0 - 0.3 mg/dL - - <0.2    CBC Latest Ref Rng 03/20/2015 03/19/2015 08/06/2014  WBC 4.0 - 10.5 K/uL 6.3 5.8 4.9  Hemoglobin 13.0 - 17.0 g/dL 15.9 16.9 -  Hematocrit 39.0 - 52.0 % 45.5 48.2 46.1  Platelets 150 - 400 K/uL 198 196.0 168   Lab Results  Component Value Date   MCV 90.6 03/20/2015   MCV 91.6 03/19/2015   MCV 94 08/06/2014    Lab Results  Component Value Date    TSH 1.480 08/06/2014    BNP No results found for: BNP  ProBNP    Component Value Date/Time   PROBNP 18.0 03/19/2015 1034     Lipid Panel     Component Value Date/Time   CHOL 114 08/06/2014 0746   TRIG 116 08/06/2014 0746   HDL 28* 08/06/2014 0746   LDLCALC 63 08/06/2014 0746     RADIOLOGY: Dg Chest 2 View  03/19/2015  CLINICAL DATA:  One month of dyspnea on exertion; history of previous MI, hypertension, former smoker. EXAM: CHEST  2 VIEW COMPARISON:  Chest CT scan of October 11, 2014 and portable chest x-ray of July 04, 2014 FINDINGS: The lungs remain mildly hypoinflated but clear. The heart and pulmonary vascularity are normal. The mediastinum is normal in width. There is no pleural effusion or pneumothorax. The bony thorax exhibits no acute abnormality. IMPRESSION: Mild hypoinflation.  There is no acute cardiopulmonary abnormality. Electronically Signed   By: David  Martinique M.D.   On: 03/19/2015 10:59      ASSESSMENT AND PLAN: Mr. 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  He presented with an acute coronary syndrome and ruled in for non-ST segment elevation MI with a peak troponin of 18.22 on 07/05/2014 and underwent successful  PCI involving a subtotal greater than 99% ostial LAD stenosis with ultimate insertion of a 3.520 mm [employer]'s Synergy DES stent which covered 3 LAD lesions. His ECG  does not show any evidence for his prior MI.  An echo Doppler study on 08/09/2014 showed an EF of 60-65% with grade 1 diastolic dysfunction.  There was mild mitral annular calcification and trivial aortic insufficiency.  He had done exceptionally well and had been exercising at least 5 days per week until last month when he developed increasing episodes of shortness of breath, more fatigue with activity, experience some recurrent chest tightness.  I long  discussion with the patient and his wife and reviewed the catheterization findings from the procedure done on 03/24/2015. His LAD stent was widely patent.  There was a 35% narrowing in the LAD after the diagonal vessel.  Beyond the stented segment.  His wife is very concerned since previous to his acute coronary syndrome, a prior cardiac catheterization, had only showed 40% narrowing and ultimately, the patient suddenly developed an acute coronary syndrome 8 months later.  I reviewed with them the fact that he is on aggressive medical therapy presently.  There was some concern about the possibility of diastolic dysfunction contributing to his dyspnea.  For this reason, I will attempt to slightly.  Additionally titrate his losartan.  Blood pressure allows.  He currently is taking 25 mg daily and he will now institute a 12.5 mg p.m. dose for total dose of 37.5 mg daily.  Ultimately would be beneficial to titrate this further.  He continues to be on aggressive lipid-lowering therapy.  He's not had recent laboratory.  I am recommending a complete set of laboratory be checked in the fasting state.  If his LDL is not less than 70  I will institute Zetia to his atorvastatin 80 mg daily to induce potential coronary regression with very aggressive statin therapy.  I am also recommending a follow-up 2-D echo Doppler study  to reevaluate diastolic function and pulmonary pressures.  I will see him in June for follow-up evaluation.   Time spent: 25 minutes Troy Sine, MD, [facility]  04/15/2015 7:33 AM

## 2015-04-16 ENCOUNTER — Other Ambulatory Visit (HOSPITAL_COMMUNITY)
Admission: RE | Admit: 2015-04-16 | Discharge: 2015-04-16 | Disposition: A | Discharge status: Home / Self Care | Payer: PRIVATE HEALTH INSURANCE | Source: Ambulatory Visit | Attending: Cardiovascular Disease | Admitting: Cardiovascular Disease

## 2015-04-16 DIAGNOSIS — I2510 Atherosclerotic heart disease of native coronary artery without angina pectoris: Secondary | ICD-10-CM | POA: Diagnosis present

## 2015-04-16 DIAGNOSIS — E785 Hyperlipidemia, unspecified: Secondary | ICD-10-CM | POA: Diagnosis not present

## 2015-04-16 DIAGNOSIS — Z9861 Coronary angioplasty status: Secondary | ICD-10-CM | POA: Diagnosis not present

## 2015-04-16 DIAGNOSIS — R5383 Other fatigue: Secondary | ICD-10-CM | POA: Insufficient documentation

## 2015-04-16 DIAGNOSIS — R0600 Dyspnea, unspecified: Secondary | ICD-10-CM | POA: Diagnosis not present

## 2015-04-16 DIAGNOSIS — I10 Essential (primary) hypertension: Secondary | ICD-10-CM | POA: Diagnosis not present

## 2015-04-16 LAB — COMPREHENSIVE METABOLIC PANEL
ALBUMIN: 4.2 g/dL (ref 3.5–5.0)
ALT: 40 U/L (ref 17–63)
ANION GAP: 5 (ref 5–15)
AST: 31 U/L (ref 15–41)
Alkaline Phosphatase: 69 U/L (ref 38–126)
BUN: 16 mg/dL (ref 6–20)
CALCIUM: 8.7 mg/dL — AB (ref 8.9–10.3)
CO2: 25 mmol/L (ref 22–32)
Chloride: 109 mmol/L (ref 101–111)
Creatinine, Ser: 0.87 mg/dL (ref 0.61–1.24)
GFR calc non Af Amer: >60 mL/min (ref >60)
GLUCOSE: 98 mg/dL (ref 65–99)
POTASSIUM: 4.1 mmol/L (ref 3.5–5.1)
SODIUM: 139 mmol/L (ref 135–145)
TOTAL PROTEIN: 7 g/dL (ref 6.5–8.1)
Total Bilirubin: 1.2 mg/dL (ref 0.3–1.2)

## 2015-04-16 LAB — LIPID PANEL
CHOL/HDL RATIO: 3.7 ratio
Cholesterol: 103 mg/dL (ref 0–200)
HDL: 28 mg/dL — AB (ref >40)
LDL CALC: 61 mg/dL (ref 0–99)
TRIGLYCERIDES: 70 mg/dL (ref <150)
VLDL: 14 mg/dL (ref 0–40)

## 2015-04-16 LAB — CBC
HEMATOCRIT: 44.3 % (ref 39.0–52.0)
Hemoglobin: 15.8 g/dL (ref 13.0–17.0)
MCH: 32.1 pg (ref 26.0–34.0)
MCHC: 35.7 g/dL (ref 30.0–36.0)
MCV: 90 fL (ref 78.0–100.0)
Platelets: 149 10*3/uL — ABNORMAL LOW (ref 150–400)
RBC: 4.92 MIL/uL (ref 4.22–5.81)
RDW: 12.4 % (ref 11.5–15.5)
WBC: 4 10*3/uL (ref 4.0–10.5)

## 2015-04-16 LAB — TSH: TSH: 0.707 u[IU]/mL (ref 0.350–4.500)

## 2015-04-28 ENCOUNTER — Other Ambulatory Visit: Payer: Self-pay

## 2015-04-28 ENCOUNTER — Ambulatory Visit (HOSPITAL_COMMUNITY): Payer: PRIVATE HEALTH INSURANCE | Attending: Cardiovascular Disease

## 2015-04-28 DIAGNOSIS — E785 Hyperlipidemia, unspecified: Secondary | ICD-10-CM

## 2015-04-28 DIAGNOSIS — Z9861 Coronary angioplasty status: Secondary | ICD-10-CM

## 2015-04-28 DIAGNOSIS — I351 Nonrheumatic aortic (valve) insufficiency: Secondary | ICD-10-CM | POA: Diagnosis not present

## 2015-04-28 DIAGNOSIS — R5383 Other fatigue: Secondary | ICD-10-CM

## 2015-04-28 DIAGNOSIS — I10 Essential (primary) hypertension: Secondary | ICD-10-CM

## 2015-04-28 DIAGNOSIS — I119 Hypertensive heart disease without heart failure: Secondary | ICD-10-CM | POA: Insufficient documentation

## 2015-04-28 DIAGNOSIS — R0600 Dyspnea, unspecified: Secondary | ICD-10-CM | POA: Diagnosis present

## 2015-04-28 DIAGNOSIS — I2510 Atherosclerotic heart disease of native coronary artery without angina pectoris: Secondary | ICD-10-CM

## 2015-05-13 ENCOUNTER — Telehealth: Payer: Self-pay | Admitting: Cardiovascular Disease

## 2015-05-13 NOTE — Telephone Encounter (Signed)
05/13/2015 Received faxed records from [facility] for upcoming appointment with Dr. Claiborne Billings on 07/17/2015.  Records given to [facility]. cbr

## 2015-05-14 ENCOUNTER — Encounter: Payer: Self-pay | Admitting: Pulmonary Disease

## 2015-05-14 ENCOUNTER — Ambulatory Visit (INDEPENDENT_AMBULATORY_CARE_PROVIDER_SITE_OTHER): Payer: PRIVATE HEALTH INSURANCE | Admitting: Pulmonary Disease

## 2015-05-14 VITALS — BP 110/72 | HR 68 | Ht 73.0 in | Wt 260.0 lb

## 2015-05-14 DIAGNOSIS — R938 Abnormal findings on diagnostic imaging of other specified body structures: Secondary | ICD-10-CM

## 2015-05-14 DIAGNOSIS — R9389 Abnormal findings on diagnostic imaging of other specified body structures: Secondary | ICD-10-CM

## 2015-05-14 DIAGNOSIS — R0600 Dyspnea, unspecified: Secondary | ICD-10-CM

## 2015-05-14 NOTE — Progress Notes (Signed)
   Subjective:    Patient ID: Richard Simpson, male    DOB: 1970/09/23, 45 y.o.   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 Amil Amen)    05/14/2015  Chief Complaint  Patient presents with  . Follow-up    SOB, DOE; chest tightness.     He was  followed for abnormal area on CT chest . Noted to have GGO in LUL on chest CT  He had a follow up CT chest in 09/2014 that showed significant decrease in size down to 2.7cm (previously 6cm ) felt to be area of resolving pneumonitis .  He has been working out at the chin chest tightness always occurs with some activity/ always goes away with 10 - 15 min rest,  remicade helping arthritis Saw dr Claiborne Billings - cath did not show progressive CAD He denies wheezing or pollen issues  Rpt echo 04/2015 >> nml LVEF  PFTs 12/2013 >>no obs, mild restriction, nml DLCO - ?effect of obesity  Significant tests/ events  06/2013 NSTEMI, PTCA to LAD, EF 45% 06/25/2014 CT angio chest >> Patchy regions of ground-glass attenuation opacity 72mm & 6cm in the medial aspect of the left upper lobe with associated volume loss 09/2014 CT chest >Decrease in size of ground-glass attenuating opacity within the perihilar left upper lobe compatible with a benign, likely infectious or inflammatory process.  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 Pleasant, obese, in no distress ENT - no lesions, no post nasal drip Neck: No JVD, no thyromegaly, no carotid bruits Lungs: no use of  accessory muscles, no dullness to percussion, decreased without rales or rhonchi  Cardiovascular: Rhythm regular, heart sounds  normal, no murmurs or gallops, no peripheral edema Musculoskeletal: No deformities, no cyanosis or clubbing , no tremors       Assessment & Plan:

## 2015-05-14 NOTE — Assessment & Plan Note (Signed)
Doubt need for follow-up imaging Infiltrate was resolving on CT on 9/16-favor inflammatory  cause

## 2015-05-14 NOTE — Patient Instructions (Signed)
Call as needed Weight yourself 2-3 / week Use lasix as directed

## 2015-05-14 NOTE — Assessment & Plan Note (Signed)
Cause is unclear-mild restriction on his PFTs is likely related to obesity-no evidence of interstitial lung disease He may have mild diastolic dysfunction, Weight yourself 2-3 / week Use lasix as directed

## 2015-06-10 ENCOUNTER — Ambulatory Visit: Payer: PRIVATE HEALTH INSURANCE | Admitting: Pulmonary Disease

## 2015-06-19 ENCOUNTER — Telehealth: Payer: Self-pay | Admitting: Pulmonary Disease

## 2015-06-19 NOTE — Telephone Encounter (Signed)
Rec'd request for records from Veneta Penton, RN with [facility]. I faxed 7 pages on 06/18/2015 and received confirmation from the fax that 7 pages were received

## 2015-07-17 ENCOUNTER — Ambulatory Visit (INDEPENDENT_AMBULATORY_CARE_PROVIDER_SITE_OTHER): Payer: PRIVATE HEALTH INSURANCE | Admitting: Cardiovascular Disease

## 2015-07-17 ENCOUNTER — Encounter: Payer: Self-pay | Admitting: Cardiovascular Disease

## 2015-07-17 VITALS — BP 106/80 | HR 67 | Ht 73.0 in | Wt 254.6 lb

## 2015-07-17 DIAGNOSIS — I25119 Atherosclerotic heart disease of native coronary artery with unspecified angina pectoris: Secondary | ICD-10-CM

## 2015-07-17 DIAGNOSIS — R0789 Other chest pain: Secondary | ICD-10-CM

## 2015-07-17 DIAGNOSIS — R079 Chest pain, unspecified: Secondary | ICD-10-CM

## 2015-07-17 DIAGNOSIS — E669 Obesity, unspecified: Secondary | ICD-10-CM

## 2015-07-17 DIAGNOSIS — E785 Hyperlipidemia, unspecified: Secondary | ICD-10-CM

## 2015-07-17 DIAGNOSIS — I10 Essential (primary) hypertension: Secondary | ICD-10-CM

## 2015-07-17 MED ORDER — ISOSORBIDE MONONITRATE ER 30 MG PO TB24: 30.0000 mg | ORAL_TABLET | Freq: Every day | ORAL | Status: DC

## 2015-07-17 NOTE — Patient Instructions (Addendum)
Medication Instructions:   START TAKING IMDUR 30 MG ONCE A DAY   If you need a refill on your cardiac medications before your next appointment, please call your pharmacy.  Labwork:  NONE ORDER TODAY    Testing/Procedures: NONE ORDER TODAY    Follow-Up:  IN 2 TO 3 MONTHS WITH DR Claiborne Billings    Any Other Special Instructions Will Be Listed Below (If Applicable).

## 2015-07-19 ENCOUNTER — Encounter: Payer: Self-pay | Admitting: Cardiovascular Disease

## 2015-07-19 DIAGNOSIS — R079 Chest pain, unspecified: Secondary | ICD-10-CM | POA: Insufficient documentation

## 2015-07-19 NOTE — Progress Notes (Signed)
Patient ID: Richard Simpson, male   DOB: February 24, 1970, 45 y.o.   MRN: [id number]     HPI: Richard Simpson is a 45 y.o. male who presents to the office today for a 3 month follow-up cardiology evaluation.  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 He developed severe chest pain the evening of July 04, 2014 leading to his [facility] hospitalization on the morning of June 10.  He was seen in consultation by Dr. Harl Bowie and transferred for urgent cardiac catheterization which was done by me. This revealed a subtotal 99+ percent ostial LAD stenosis followed by proximal 70% and 50% stenoses.  Initial ejection fraction was 45% with anterolateral hypocontractility.  He underwent successful intervention with insertion of Synergy DES 3.520 mm stent placed from the LAD ostium to cover all 3 lesions.  Subsequently, he noticed marked benefit in his previous exertional shortness of breath symptomatology.  He was seen  In follow-up by Murray Hodgkins, NP and due to and ACE-induced cough was started on losartan in place of lisinopril. He has a history of arthritic disease and in the past had been treated for probable psoriatic arthritis but he is now felt to have rheumatoid arthritis.  An echo Doppler study on 08/09/2014 which mild focal basal hypertrophy of the septum.  There are no wall motion abnormalities.  Ejection fraction was 60-65%.  There was grade 1 diastolic dysfunction.  The mitral valve was mildly calcified at its annulus and there was trivial aortic insufficiency.  He participated in cardiac rehabilitation last year.  He began to develop recurrent episodes of chest tightness and underwent repeat cardiac catheterization on 03/24/2015 by Dr. Ellyn Hack which showed a widely patent stent at the ostium and proximal LAD segment.  There was mild 35% narrowing after the takeoff of the first diagonal vessel.  His circumflex and RCA  were considered to be normal.  His LVEDP was mildly increased.  It was felt that there was no new evidence for a new CAD lesion to explain his symptoms.  It was felt that possibly his symptoms were related to microvascular disease versus diastolic dysfunction.  Subsequently, he was started on Lasix 20 mg.  He also has been taking amlodipine 5 mg, losartan 25 mg, metoprolol tartrate 25 mg twice a day.  He continues to be on aspirin 81 mg and Brilinta 90 mg twice a day.  His wife was very concerned about this 35% LAD stenosis.   When I saw him 3 months ago, I titrated his losartan from 25 mg to 37.5 mg and recommended addition of Zetia to his 80 mg atorvastatin in attempt to induce further plaque regression.  He underwent an echo Doppler study on 04/28/2015 which showed an ejection fraction at 60-65%.  He did not have any regional wall motion abnormalities.  He had mild left atrial dilatation.  He was felt to have normal diastolic function.  Since I saw him, he has continued to experience episodes of chest discomfort which he believes occurs with activity.  He presents for follow-up evaluation.  Past Medical History  Diagnosis Date  . NSTEMI (non-ST elevated myocardial infarction) ([place]) 06/2014  . CAD S/P percutaneous coronary angioplasty 10/2013    a. 10/2013 Cath: mod LAD dzs->nl FFR-> medically managed;  b. 06/2014 NSTEMI/PCi: LAD 99ost, 70p, 58m(entire area covered with a 3.5x20 Synergy DES), LCX nl, RCA 262m10d, EF 45-50%.   . Ischemic  cardiomyopathy - Resolved 06/2014    a. 06/2014 EF 45-50% by LV gram;; b. Echo July 2016: EF 60-65%. No RWMA. Gr 1 DD   . Essential hypertension   . Dyslipidemia, goal LDL below 70   . Obesity (BMI 30-39.9) 10/29/2013  . Primary snoring 10/29/2013  . RLS (restless legs syndrome) 10/29/2013  . Rheumatoid arthritis ([place]) 09/23/2013  . 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 Headache     Past  Surgical History  Procedure Laterality Date  . Gallbladder surgery    . Throat surgery      nodules removed from throat as child  . Cholecystectomy      10 yrs ago-[facility]  . Mass excision  09/09/2010    Procedure: EXCISION MASS;  Surgeon: Arther Abbott, MD;  Location: [facility] ORS;  Service: Orthopedics;  Laterality: Left;  Excision Mass Left Long Finger  . Colonoscopy N/A 03/23/2013    Procedure: COLONOSCOPY;  Surgeon: Daneil Dolin, MD;  Location: [facility] ENDO SUITE;  Service: Endoscopy;  Laterality: N/A;  2:15-moved to 1030 Staff notified pt  . Left heart catheterization with coronary angiogram N/A 11/23/2013    Procedure: LEFT HEART CATHETERIZATION WITH CORONARY ANGIOGRAM;  Surgeon: Jettie Booze, MD;  Location: [facility] CATH LAB;  Service: Cardiovascular;  Laterality: N/A;  . Fractional flow reserve wire  11/23/2013    Procedure: FRACTIONAL FLOW RESERVE WIRE;  Surgeon: Jettie Booze, MD;  Location: [facility] CATH LAB;  Service: Cardiovascular;;  . Cardiac catheterization N/A 07/05/2014    Procedure: Left Heart Cath and Coronary Angiography;  Surgeon: Troy Sine, MD;  Location: [facility] CV LAB;  Service: Cardiovascular;  NSTEMI --> Ostial LAd 99%, prox 80% & early mid 50% --> PCI  . Cardiac catheterization  07/05/2014    Procedure: Coronary Stent Intervention;  Surgeon: Troy Sine, MD;  Location: [place] CV LAB;  Service: Cardiovascular; Ostial-prox LAD tandem 99, 80 & 50% --> Synergy DES 3.5 x 20 (3.74)  . Transthoracic echocardiogram   July 2016:     EF 60-65%. No RWMA. Gr 1 DD  . Cardiac catheterization N/A 03/24/2015    Procedure: Left Heart Cath and Coronary Angiography;  Surgeon: Leonie Man, MD;  Location: [place] CV LAB;  Service: Cardiovascular;  Laterality: N/A;    Allergies  Allergen Reactions  . Lisinopril Cough  . Prednisone Other (See Comments)    Makes angry    Current Outpatient Prescriptions  Medication Sig Dispense Refill  . amLODipine (NORVASC) 5 MG  tablet TAKE 1 TABLET BY MOUTH ONCE A DAY. 30 tablet 5  . aspirin EC 81 MG tablet Take 1 tablet (81 mg total) by mouth daily. 30 tablet 3  . atorvastatin (LIPITOR) 80 MG tablet TAKE ONE TABLET BY MOUTH ONCE DAILY. 30 tablet 6  . clonazePAM (KLONOPIN) 0.5 MG tablet Take 0.5 mg by mouth 2 (two) times daily as needed for anxiety.    . furosemide (LASIX) 20 MG tablet Take 1 tablet (20 mg total) by mouth daily. 30 tablet 6  . InFLIXimab (REMICADE IV) Inject into the vein as directed. Every 6 weeks    . loratadine (CLARITIN) 10 MG tablet Take 10 mg by mouth daily.     Marland Kitchen losartan (COZAAR) 25 MG tablet Take 25 mg  In the morning , and 12.5. mg in the evening 75 tablet 4  . metoprolol tartrate (LOPRESSOR) 25 MG tablet TAKE (1)  TABLET BY MOUTH TWICE DAILY. 60 tablet 4  . nitroGLYCERIN (NITROSTAT) 0.4 MG SL tablet Place 0.4 mg under the tongue every 5 (five) minutes as needed for chest pain (x 3 doses).    . ranitidine (ZANTAC) 150 MG capsule Take 150 mg by mouth 2 (two) times daily.    . ticagrelor (BRILINTA) 90 MG TABS tablet Take 1 tablet (90 mg total) by mouth 2 (two) times daily. 60 tablet 6  . traMADol (ULTRAM) 50 MG tablet Take 50 mg by mouth every 6 (six) hours as needed for moderate pain.     . isosorbide mononitrate (IMDUR) 30 MG 24 hr tablet Take 1 tablet (30 mg total) by mouth daily. 30 tablet 3   No current facility-administered medications for this visit.    Social History   Social History  . Marital Status: Married    Spouse Name: [name]  . Number of Children: 2  . Years of Education: 12th grade   Occupational History  . Engineer, structural   .     Social History Main Topics  . Smoking status: Former Smoker -- 0.25 packs/day    Types: Cigarettes    Quit date: 09/04/2003  . Smokeless tobacco: Current User    Types: Chew  . Alcohol Use: 0.0 oz/week    1-2 Cans of beer per week     Comment: rare  . Drug Use: No  . Sexual Activity: Yes   Other Topics Concern  . Not on file    Social History Narrative   Patient lives at home with his wife [name]).    Patient works full time Dentist..   Education high school.   Right handed.   Caffeine one cup of coffee daily and one soda.    Family History  Problem Relation Age of Onset  . Arthritis Paternal Uncle   . Cancer Maternal Grandfather   . Anesthesia problems Neg Hx   . Hypotension Neg Hx   . Malignant hyperthermia Neg Hx   . Pseudochol deficiency Neg Hx   . Heart attack Neg Hx   . Stroke Neg Hx   . Hypertension Mother   . Hypertension Maternal Grandmother   . Diabetes    . Diabetes Mother   . Diabetes Maternal Grandmother   . Diabetes    . Diabetes Paternal Grandfather     ROS General: Negative; No fevers, chills, or night sweats HEENT: Negative; No changes in vision or hearing, sinus congestion, difficulty swallowing Pulmonary: Negative; No cough, wheezing, shortness of breath, hemoptysis Cardiovascular: See HPI: No chest pain, presyncope, syncope, palpatations GI: Negative; No nausea, vomiting, diarrhea, or abdominal pain GU: Negative; No dysuria, hematuria, or difficulty voiding Musculoskeletal: Negative; no myalgias, joint pain, or weakness Hematologic: Negative; no easy bruising, bleeding Endocrine: Negative; no heat/cold intolerance; no diabetes, Neuro: Negative; no changes in balance, headaches Skin: Negative; No rashes or skin lesions Psychiatric: Negative; No behavioral problems, depression Sleep: Negative; No snoring,  daytime sleepiness, hypersomnolence, bruxism, restless legs, hypnogognic hallucinations. Other comprehensive 14 point system review is negative   Physical Exam BP 106/80 mmHg  Pulse 67  Ht '6\' 1"'  (1.854 m)  Wt 254 lb 9.6 oz (115.486 kg)  BMI 33.60 kg/m2   Repeat blood pressure by me 112/78  Wt Readings from Last 3 Encounters:  07/17/15 254 lb 9.6 oz (115.486 kg)  05/14/15 260 lb (117.935 kg)  04/14/15 256 lb 4.8 oz (116.257 kg)   General: Alert,  oriented, no distress.  Skin:  normal turgor, no rashes, warm and dry HEENT: Normocephalic, atraumatic. Pupils equal round and reactive to light; sclera anicteric; extraocular muscles intact, No lid lag; Nose without nasal septal hypertrophy; Mouth/Parynx benign; Mallinpatti scale 3 Neck:  Thick neck;No JVD, no carotid bruits; normal carotid upstroke Lungs: clear to ausculatation and percussion bilaterally; no wheezing or rales, normal inspiratory and expiratory effort Chest wall: without tenderness to palpitation Heart: PMI not displaced, RRR, s1 s2 normal,  Faint 1/6 systolic murmur, No diastolic murmur, no rubs, gallops, thrills, or heaves Abdomen: soft, nontender; no hepatosplenomehaly, BS+; abdominal aorta nontender and not dilated by palpation. Back: no CVA tenderness Pulses: 2+  Musculoskeletal: full range of motion, normal strength, no joint deformities Extremities: Pulses 2+, no clubbing cyanosis or edema, Homan's sign negative  Neurologic: grossly nonfocal; Cranial nerves grossly wnl Psychologic: Normal mood and affect  ECG (independently read by me): Sinus bradycardia 57 bpm.  Normal intervals..  She noted T-wave changes in lead 3.  March 2017 ECG (independently read by me): Normal sinus rhythm at 69 bpm.  No ectopy.  QTc interval 424 ms  October 2016 ECG (independently read by me): Normal sinus rhythm at 65 bpm.  Nondiagnostic T changes.  No ECG evidence of infarction.  July 2016 ECG (independently read by me):  Normal sinus rhythm at 65 bpm.  Nonspecific T-wave abnormality.  LABS:  BMP Latest Ref Rng 04/16/2015 04/08/2015 03/20/2015  Glucose 65 - 99 mg/dL 98 109(H) 93  BUN 6 - 20 mg/dL '16 15 19  ' Creatinine 0.61 - 1.24 mg/dL 0.87 0.94 0.90  Sodium 135 - 145 mmol/L 139 139 138  Potassium 3.5 - 5.1 mmol/L 4.1 4.2 4.3  Chloride 101 - 111 mmol/L 109 105 104  CO2 22 - 32 mmol/L '25 25 23  ' Calcium 8.9 - 10.3 mg/dL 8.7(L) 9.5 9.5    Hepatic Function Latest Ref Rng 04/16/2015  08/06/2014 07/04/2014  Total Protein 6.5 - 8.1 g/dL 7.0 6.7 6.8  Albumin 3.5 - 5.0 g/dL 4.2 4.3 4.2  AST 15 - 41 U/L 31 36 35  ALT 17 - 63 U/L 40 51(H) 51  Alk Phosphatase 38 - 126 U/L 69 82 55  Total Bilirubin 0.3 - 1.2 mg/dL 1.2 0.9 1.0  Bilirubin, Direct 0.0 - 0.3 mg/dL - - -    CBC Latest Ref Rng 04/16/2015 03/20/2015 03/19/2015  WBC 4.0 - 10.5 K/uL 4.0 6.3 5.8  Hemoglobin 13.0 - 17.0 g/dL 15.8 15.9 16.9  Hematocrit 39.0 - 52.0 % 44.3 45.5 48.2  Platelets 150 - 400 K/uL 149(L) 198 196.0   Lab Results  Component Value Date   MCV 90.0 04/16/2015   MCV 90.6 03/20/2015   MCV 91.6 03/19/2015    Lab Results  Component Value Date   TSH 0.707 04/16/2015    BNP No results found for: BNP  ProBNP    Component Value Date/Time   PROBNP 18.0 03/19/2015 1034     Lipid Panel     Component Value Date/Time   CHOL 103 04/16/2015 0820   CHOL 114 08/06/2014 0746   TRIG 70 04/16/2015 0820   HDL 28* 04/16/2015 0820   HDL 28* 08/06/2014 0746   CHOLHDL 3.7 04/16/2015 0820   VLDL 14 04/16/2015 0820   LDLCALC 61 04/16/2015 0820   LDLCALC 63 08/06/2014 0746     RADIOLOGY: No results found.    ASSESSMENT AND PLAN: Mr. 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  He presented with an acute coronary syndrome and ruled in for non-ST segment elevation MI with a peak troponin of 18.22 on 07/05/2014 and underwent successful PCI involving a subtotal greater than 99% ostial LAD stenosis with ultimate insertion of a 3.520 mm [employer]'s Synergy DES stent which covered 3 LAD lesions. His ECG  does not show any evidence for his prior MI.  An echo Doppler study on 08/09/2014 showed an EF of 60-65% with grade 1 diastolic dysfunction.  There was mild mitral annular calcification and trivial aortic insufficiency.  He underwent repeat catheterization in February 2017 due to  recurrent episodes of chest pain.  This revealed a widely patent stent and only mild nonobstructive 35% LAD stenosis.  He has been on increasing doses of losartan.  A follow-up echo Doppler study done in April 2017 showed normal systolic as well as normal diastolic function without previously noted grade 1 diastolic dysfunction on ARB therapy.  He has continued to experience episodes of chest pain.  It is certainly possible that some of his symptoms may be related to small vessel disease which is not detected on coronary angiography.  This can be picked up on cardiopulmonary exercise testing.  Presently, I will empirically add isosorbide mononitrate 30 mg daily to his medical regimen to see if this improves his symptomatology.  I again discussed importance of further weight loss.  He continues to be on aggressive lipid-lowering therapy, and most recent laboratory showed that he is at target with an LDL at 61.  His blood pressure today remains stable.  I will see him in 2-3 months for reevaluation.  Time spent: 25 minutes Troy Sine, MD, [facility]  07/19/2015 9:29 AM

## 2015-07-21 ENCOUNTER — Other Ambulatory Visit: Payer: Self-pay | Admitting: Cardiovascular Disease

## 2015-07-22 NOTE — Telephone Encounter (Signed)
Rx request sent to pharmacy.  

## 2015-09-11 ENCOUNTER — Other Ambulatory Visit: Payer: Self-pay | Admitting: Cardiovascular Disease

## 2015-09-22 ENCOUNTER — Telehealth: Payer: Self-pay | Admitting: Cardiovascular Disease

## 2015-09-22 ENCOUNTER — Other Ambulatory Visit: Payer: Self-pay | Admitting: Interventional Cardiology

## 2015-09-22 ENCOUNTER — Other Ambulatory Visit: Payer: Self-pay | Admitting: Nurse Practitioner

## 2015-09-22 NOTE — Telephone Encounter (Signed)
Pt requested to speak to the nurse. He is very upset because he can get an appointment until September.In June he was told schedule was not available,but he was told he would get a call. Pt still had not received call,so he decided to call today.

## 2015-09-22 NOTE — Telephone Encounter (Signed)
Returned call to patient.He stated at last visit with [facility] he prescribed Imdur 30 mg.Stated he was unable to take due to low B/P.Stated he was suppose to see Dr.Kelly in August, but was told he has no openings.Stated he has occasional chest tightness.Appointment offered 09/24/15 with Rosaria Ferries, but he is not able to come in this week.Appointment scheduled with Rosaria Ferries PA 10/03/15 at 3:30 pm.Advised to go to ER if needed.

## 2015-09-22 NOTE — Telephone Encounter (Signed)
Rx request sent to pharmacy.  

## 2015-09-23 NOTE — Telephone Encounter (Signed)
Rx request sent to pharmacy.  

## 2015-09-24 ENCOUNTER — Ambulatory Visit: Payer: PRIVATE HEALTH INSURANCE | Admitting: Physician Assistant

## 2015-10-03 ENCOUNTER — Ambulatory Visit (INDEPENDENT_AMBULATORY_CARE_PROVIDER_SITE_OTHER): Payer: PRIVATE HEALTH INSURANCE | Admitting: Physician Assistant

## 2015-10-03 ENCOUNTER — Encounter: Payer: Self-pay | Admitting: Physician Assistant

## 2015-10-03 VITALS — BP 110/80 | HR 66 | Ht 73.0 in | Wt 262.0 lb

## 2015-10-03 DIAGNOSIS — R079 Chest pain, unspecified: Secondary | ICD-10-CM | POA: Diagnosis not present

## 2015-10-03 DIAGNOSIS — R0602 Shortness of breath: Secondary | ICD-10-CM

## 2015-10-03 DIAGNOSIS — R0609 Other forms of dyspnea: Secondary | ICD-10-CM

## 2015-10-03 DIAGNOSIS — R0600 Dyspnea, unspecified: Secondary | ICD-10-CM

## 2015-10-03 MED ORDER — NITROGLYCERIN 0.4 MG SL SUBL: SUBLINGUAL_TABLET | SUBLINGUAL | 4 refills | Status: DC

## 2015-10-03 NOTE — Patient Instructions (Addendum)
Medications:  HOLD Isosorbide for now.  OK to take Nitroglycerin 1 tab sublingual when experience=ing chest tightness and shortness of breath.   Procedures/Testing:  Your physician has recommended that you have a cardiopulmonary stress test (CPX). CPX testing is a non-invasive measurement of heart and lung function. It replaces a traditional treadmill stress test. This type of test provides a tremendous amount of information that relates not only to your present condition but also for future outcomes. This test combines measurements of you ventilation, respiratory gas exchange in the lungs, electrocardiogram (EKG), blood pressure and physical response before, during, and following an exercise protocol.   Follow-Up:  Your physician recommends that you schedule a follow-up appointment in: 1 month with Dr. Claiborne Billings or Rosaria Ferries, PA-C after CPX is completed.  If you need a refill on your cardiac medications before your next appointment, please call your pharmacy.

## 2015-10-03 NOTE — Progress Notes (Signed)
Cardiology Office Note   Date:  10/03/2015   ID:  [name], DOB 09/10/1970, MRN [id number]  PCP:  Purvis Kilts, MD  Cardiologist:  Dr. Meredeth Ide, PA-C   Chief Complaint  Patient presents with  . Follow-up    CHEST TIGHTNESS AND SOB    History of Present Illness: Richard Simpson is a 45 y.o. male with a history of NSTEMI w/ DES LAD 06/2014, HTN, HLD, ICM w/ EF 45-50% at cath>>EF 60-65% by echo 07/2014, obesity  01/2017Seen by pulmonary for shortness of breath. Symptoms did not improve and he had testing that included a cardiac catheterization (patent stent and LVEDP 16, chest x-ray with no significant abnormalities, echocardiogram with a normal EF and PAS of 25.  Seen by Dr Claiborne Billings 06/2015> still with DOE AND CP>>isosorbide prescribed for possible microvascular angina  Richard Simpson presents for further follow-up.  He continues to have problems with dyspnea on exertion. The shortness of breath always comes first. After he gets short of breath, the chest tightness will come. His wife had a machine that would check his oxygen saturation and she stated it dropped down to 89% when he was short of breath, however we could not reproduce this in the office.  He took the Imdur for a proximally 2 weeks, but did not tolerate it well, feeling lightheaded and dizzy at times. He also got a headache.  He feels the shortness of breath is extremely noticeable and that significantly limits his activity. He is not able to exercise as he would like, he has started gaining weight again because his activity level is decreased. He is used to being able to push his body to do what he needs to do and he is very frustrated by the shortness of breath.  He has tried walking on flat ground, lifting weights, and other activities, but is just not able to do very much. The chest tightness reminds him of the symptoms he was having before his heart attack. The symptoms have not changed much  since January. He feels he has gotten no better.  He is not having pedal edema, orthopnea or PND. He is not having palpitations, presyncope or syncope. He is not having chest pain except in the setting of shortness of breath   Past Medical History:  Diagnosis Date  . CAD S/P percutaneous coronary angioplasty 10/2013   a. 10/2013 Cath: mod LAD dzs->nl FFR-> medically managed;  b. 06/2014 NSTEMI/PCi: LAD 99ost, 70p, 27m (entire area covered with a 3.5x20 Synergy DES), LCX nl, RCA 28m, 10d, EF 45-50%.   . DEGENERATIVE [place] DISEASE, THORACIC SPINE 09/16/2009   Qualifier: Diagnosis of  By: [name] MD, [name]    . Dyslipidemia, goal LDL below 70   . Essential hypertension   . Headache   . Ischemic cardiomyopathy - Resolved 06/2014   a. 06/2014 EF 45-50% by LV gram;; b. Echo July 2016: EF 60-65%. No RWMA. Gr 1 DD   . NSTEMI (non-ST elevated myocardial infarction) ([place]) 06/2014  . Obesity (BMI 30-39.9) 10/29/2013  . Primary snoring 10/29/2013  . Rheumatoid arthritis ([place]) 09/23/2013  . RLS (restless legs syndrome) 10/29/2013    Past Surgical History:  Procedure Laterality Date  . CARDIAC CATHETERIZATION N/A 07/05/2014   Procedure: Left Heart Cath and Coronary Angiography;  Surgeon: Troy Sine, MD;  Location: [facility];  Service: Cardiovascular;  NSTEMI --> Ostial LAd 99%, prox 80% & early mid 50% --> PCI  .  CARDIAC CATHETERIZATION  07/05/2014   Procedure: Coronary Stent Intervention;  Surgeon: Troy Sine, MD;  Location: [facility];  Service: Cardiovascular; Ostial-prox LAD tandem 99, 80 & 50% --> Synergy DES 3.5 x 20 (3.74)  . CARDIAC CATHETERIZATION N/A 03/24/2015   Procedure: Left Heart Cath and Coronary Angiography;  Surgeon: Leonie Man, MD;  Location: [facility];  Service: Cardiovascular;  Laterality: N/A;  . CHOLECYSTECTOMY     10 yrs ago-jenkins  . COLONOSCOPY N/A 03/23/2013   Procedure: COLONOSCOPY;  Surgeon: Daneil Dolin, MD;  [facility];   Service: Endoscopy;  Laterality: N/A;  2:15-moved to 1030 Staff notified pt  . FRACTIONAL FLOW RESERVE WIRE  11/23/2013   Procedure: FRACTIONAL FLOW RESERVE WIRE;  Surgeon: Jettie Booze, MD;  Location: [facility] CATH LAB;  Service: Cardiovascular;;  . GALLBLADDER SURGERY    . LEFT HEART CATHETERIZATION WITH CORONARY ANGIOGRAM N/A 11/23/2013   Procedure: LEFT HEART CATHETERIZATION WITH CORONARY ANGIOGRAM;  Surgeon: Jettie Booze, MD;  Location: [facility] CATH LAB;  Service: Cardiovascular;  Laterality: N/A;  . MASS EXCISION  09/09/2010   Procedure: EXCISION MASS;  Surgeon: Arther Abbott, MD;  Location: [facility];  Service: Orthopedics;  Laterality: Left;  Excision Mass Left Long Finger  . THROAT SURGERY     nodules removed from throat as child  . TRANSTHORACIC ECHOCARDIOGRAM   July 2016:    EF 60-65%. No RWMA. Gr 1 DD    Current Outpatient Prescriptions  Medication Sig Dispense Refill  . amLODipine (NORVASC) 5 MG tablet TAKE 1 TABLET BY MOUTH ONCE A DAY. 30 tablet 6  . aspirin EC 81 MG tablet Take 1 tablet (81 mg total) by mouth daily. 30 tablet 3  . atorvastatin (LIPITOR) 80 MG tablet TAKE ONE TABLET BY MOUTH ONCE DAILY. 30 tablet 0  . BRILINTA 90 MG TABS tablet TAKE (1) TABLET BY MOUTH TWICE DAILY. 60 tablet 3  . clonazePAM (KLONOPIN) 0.5 MG tablet Take 0.5 mg by mouth 2 (two) times daily as needed for anxiety.    . furosemide (LASIX) 20 MG tablet Take 1 tablet (20 mg total) by mouth daily. 30 tablet 6  . InFLIXimab (REMICADE IV) Inject into the vein as directed. Every 6 weeks    . loratadine (CLARITIN) 10 MG tablet Take 10 mg by mouth daily.     Marland Kitchen losartan (COZAAR) 25 MG tablet Take 25 mg  In the morning , and 12.5. mg in the evening 75 tablet 4  . metoprolol tartrate (LOPRESSOR) 25 MG tablet Take 1 tablet (25 mg total) by mouth 2 (two) times daily. Please schedule appointment for refills. 60 tablet 0  . nitroGLYCERIN (NITROSTAT) 0.4 MG SL tablet Take one tab sublingual when experiencing  chest tightness and shortness of breath. 25 tablet 4  . ranitidine (ZANTAC) 150 MG capsule Take 150 mg by mouth 2 (two) times daily.    . traMADol (ULTRAM) 50 MG tablet Take 50 mg by mouth every 6 (six) hours as needed for moderate pain.      No current facility-administered medications for this visit.     Allergies:   Lisinopril and Prednisone    Social History:  The patient  reports that he quit smoking about 12 years ago. His smoking use included Cigarettes. He smoked 0.25 packs per day. His smokeless tobacco use includes Chew. He reports that he drinks alcohol. He reports that he does not use drugs.   Family History:  The patient's family  history includes Arthritis in his paternal uncle; Cancer in his maternal grandfather; Diabetes in his maternal grandmother, mother, and paternal grandfather; Hypertension in his maternal grandmother and mother.    ROS:  Please see the history of present illness. All other systems are reviewed and negative.    PHYSICAL EXAM: VS:  BP 110/80 (BP Location: Right Arm, Patient Position: Sitting, Cuff Size: Normal)   Pulse 66   Ht 6\' 1"  (1.854 m)   Wt 262 lb (118.8 kg)   SpO2 97%   BMI 34.57 kg/m  , BMI Body mass index is 34.57 kg/m. GEN: Well nourished, well developed, male in no acute distress  HEENT: normal for age  Neck: no JVD, But it is difficult to assess secondary to body habitus, no carotid bruit, no masses Cardiac: RRR; no murmur, no rubs, or gallops Respiratory: Decreased breath sounds bases and decreased inspiratory effort, noticeable increased work of breathing with exertion GI: soft, nontender, nondistended, + BS MS: no deformity or atrophy; no edema; distal pulses are 2+ in all 4 extremities   Skin: warm and dry, no rash Neuro:  Strength and sensation are intact Psych: euthymic mood, full affect   EKG:  EKG is ordered today. The ekg ordered today demonstrates sinus rhythm, rate 66, no acute ischemic changes  ECHO: 04/2015 -  Left ventricle: The cavity size was normal. There was mild focal   basal hypertrophy of the septum. Systolic function was normal.   The estimated ejection fraction was in the range of 60% to 65%.   Wall motion was normal; there were no regional wall motion   abnormalities. - Aortic valve: There was trivial regurgitation. - Left atrium: The atrium was mildly dilated.  CATH: 02/2015 1. Widely patent Ostial-Proximal LAD DES Stent. 2. Prox LAD to Mid LAD lesion, 35% stenosed just after D1 take-off (D1 & m-dLAD are same sized) 3. The left ventricular systolic function is normal. 4. Mildly Elevated LVEDP No evidence of new CAD lesion to explain the patient's Symptoms. Consider Microvascular disease vs. Diastolic Dysfunction mediated increased LVEDP leading to increased wall stress. Plan:  Standard post-Radial Cath Care with TR Band placement  Will Add Lasix 20 mg daily - 1st dose today  F/u with Dr. Claiborne Billings for further Rx.   Recent Labs: 03/19/2015: Pro B Natriuretic peptide (BNP) 18.0 04/16/2015: ALT 40; BUN 16; Creatinine, Ser 0.87; Hemoglobin 15.8; Platelets 149; Potassium 4.1; Sodium 139; TSH 0.707    Lipid Panel    Component Value Date/Time   CHOL 103 04/16/2015 0820   CHOL 114 08/06/2014 0746   TRIG 70 04/16/2015 0820   HDL 28 (L) 04/16/2015 0820   HDL 28 (L) 08/06/2014 0746   CHOLHDL 3.7 04/16/2015 0820   VLDL 14 04/16/2015 0820   LDLCALC 61 04/16/2015 0820   LDLCALC 63 08/06/2014 0746     Wt Readings from Last 3 Encounters:  10/03/15 262 lb (118.8 kg)  07/17/15 254 lb 9.6 oz (115.5 kg)  05/14/15 260 lb (117.9 kg)     Other studies Reviewed: Additional studies/ records that were reviewed today include: Office notes, hospital records and testing.  ASSESSMENT AND PLAN:  1.  Dyspnea on exertion: He had no significant volume overload on echocardiogram, performed after he had been started on Lasix after his cardiac catheterization. There were no obstructive lesions at  cath.  He has been seen by Dr. Elsworth Soho, and no further respiratory workup is indicated.  He was instructed on abdominal breathing in the office and this  seemed to help a little bit. He was ambulated in the office and his oxygen saturation stayed 95% even though he felt significantly short of breath, his respirations were a bit labored and rapid.  We will do a CP X test, to see if this will help determine the source of his dyspnea. When he gets the dyspnea, he is encouraged to try sublingual nitroglycerin to see if this helps.  If the CP X does not provide a diagnosis, we will try the Imdur at a decreased dose and if that does not work, try Ranexa.  He is encouraged to start exercising at a decreased level, walking 5 minutes at a time and then stopping to catch his breath. He was advised that getting extremely short of breath probably means he is going into anaerobic metabolism and this is not beneficial.   Current medicines are reviewed at length with the patient today.  The patient does not have concerns regarding medicines.  The following changes have been made:  no change  Labs/ tests ordered today include:   Orders Placed This Encounter  Procedures  . Cardiopulmonary exercise test  . EKG 12-Lead     Disposition:   FU with Dr. Claiborne Billings  Signed, Rosaria Ferries, PA-C  10/03/2015 5:43 PM    [place] Phone: [phone]; Fax: [phone]  This note was written with the assistance of speech recognition software. Please excuse any transcriptional errors.

## 2015-10-07 ENCOUNTER — Encounter (HOSPITAL_COMMUNITY): Payer: Self-pay | Admitting: *Deleted

## 2015-10-07 ENCOUNTER — Ambulatory Visit (HOSPITAL_COMMUNITY): Payer: PRIVATE HEALTH INSURANCE | Attending: Physician Assistant

## 2015-10-07 DIAGNOSIS — R0609 Other forms of dyspnea: Secondary | ICD-10-CM | POA: Insufficient documentation

## 2015-10-07 DIAGNOSIS — R0602 Shortness of breath: Secondary | ICD-10-CM

## 2015-10-09 DIAGNOSIS — R0602 Shortness of breath: Secondary | ICD-10-CM | POA: Diagnosis not present

## 2015-10-20 ENCOUNTER — Other Ambulatory Visit: Payer: Self-pay | Admitting: Cardiovascular Disease

## 2015-10-21 NOTE — Telephone Encounter (Signed)
Rx(s) sent to pharmacy electronically.  

## 2015-10-22 ENCOUNTER — Telehealth: Payer: Self-pay | Admitting: *Deleted

## 2015-10-22 NOTE — Telephone Encounter (Signed)
-----   Message from Lonn Georgia, PA-C sent at 10/09/2015  5:41 PM EDT ----- Please let him know we got good information from the test. His effort was excellent and his oxygen level stayed up. It did show some limitations, but the kind that can improve with time and exercise. The plan we came up with when he was here in the office, for him to increase his exercise slowly, and make sure he goes at his own pace, is a good one.  Continue this, track progress and f/u as planned.

## 2015-10-22 NOTE — Telephone Encounter (Signed)
Left message for pt to call.

## 2015-10-24 NOTE — Telephone Encounter (Signed)
Discussed results w wife, who verbalized understanding and thanks. Will follow up as scheduled.

## 2015-10-24 NOTE — Telephone Encounter (Signed)
Follow up     Returning a call to the nurse to get lab results.  OK to talk to the wife if he is not home

## 2015-11-04 ENCOUNTER — Telehealth: Payer: Self-pay | Admitting: Cardiovascular Disease

## 2015-11-04 NOTE — Telephone Encounter (Signed)
Pt c/o of Chest Pain: 1. Are you having CP right now? No 2. Are you experiencing any other symptoms (ex. SOB, nausea, vomiting, sweating)? SOB, weakness in both arms  3. How long have you been experiencing CP? Off and on x 6 months . Is your CP continuous or coming and going? Comes and goes 5. Have you taken Nitroglycerin? Yes  Pls call [phone]

## 2015-11-04 NOTE — Telephone Encounter (Signed)
Discussed w patient. He had chest pain 2 days ago which resolved w Nitro. States this has happened before but he was concerned bc pain was "a little worse" than last time. Aware of recent good results on exercise stress test. Aware to go to ER if his anginal symptoms last more than 20 mins or not relieved after 2x nitro (requiring 3rd dose). Pt has appt to see Suanne Marker in 1 week & OK to follow up then - advised to keep this appt & call if sooner concerns. Pt voiced understanding and thanks.

## 2015-11-11 ENCOUNTER — Encounter: Payer: Self-pay | Admitting: Physician Assistant

## 2015-11-11 ENCOUNTER — Ambulatory Visit (INDEPENDENT_AMBULATORY_CARE_PROVIDER_SITE_OTHER): Payer: PRIVATE HEALTH INSURANCE | Admitting: Physician Assistant

## 2015-11-11 VITALS — BP 115/77 | HR 70 | Ht 73.0 in | Wt 261.2 lb

## 2015-11-11 DIAGNOSIS — Z01818 Encounter for other preprocedural examination: Secondary | ICD-10-CM | POA: Diagnosis not present

## 2015-11-11 DIAGNOSIS — I200 Unstable angina: Secondary | ICD-10-CM | POA: Diagnosis not present

## 2015-11-11 NOTE — Progress Notes (Signed)
Cardiology Office Note   Date:  11/11/2015   ID:  [name], DOB Aug 20, 1970, MRN [id number]  PCP:  Purvis Kilts, MD  Cardiologist:  Dr Claiborne Billings 06/2015 Rosaria Ferries, PA-C  10/03/2015  Chief Complaint  Patient presents with  . Chest Pain  . Shortness of Breath    History of Present Illness: Richard Simpson is a 45 y.o. male with a history of NSTEMI w/ DES LAD 06/2014, HTN, HLD, ICM w/ EF 45-50% at cath>>EF 60-65% by echo 07/2014, obesity  09/08, patient seen for dyspnea on exertion. He had a CPX 09/14, Conclusion: Exercise testing with gas exchange demonstrates a mild functional impairment when compared to matched sedentary norms. Test was terminated slightly early to maximal effort, however, there is no indication of cardiopulmonary limitation. Given pre-exercise spirometry and ideal weight adjustment in addition to low breathing reserve, patient is most likely limited due to his body habitus.   Richard Simpson presents for Ongoing evaluation and treatment of his dyspnea on exertion and chest pain.  Richard Simpson is very concerned about his symptoms. He states that the stress test he had before his heart attack was fine. He states that the dyspnea on exertion and chest pain has been getting worse. He feels like he cannot hardly do anything without getting short of breath and getting pain. He is pushing himself to increase his activity, but just has to stop for breath after very brief intervals. He feels that his activity level is significantly decreased from what it used to be. He has been trying to be more active and push himself, but without any success at all.  His wife reports that when he came back inside from exercising one time, he was very short of breath and had significant chest pain. She states that he was white around the lips and generally very pale/diaphoretic. The symptoms always improve with rest, but the other night, they were more severe and so he took a  nitroglycerin. The nitroglycerin helped his pain. Since then he has taken nitroglycerin several times, always with improvement in his pain.  He denies orthopnea or PND. He has not had lower extremity edema. He has been trying to eat better and lose some weight, but is very difficult for him because he cannot increase his activity. He is compliant with his medications.  This extremely concerned about his symptoms, and wants a definitive answer.   Past Medical History:  Diagnosis Date  . CAD S/P percutaneous coronary angioplasty 10/2013   a. 10/2013 Cath: mod LAD dzs->nl FFR-> medically managed;  b. 06/2014 NSTEMI/PCi: LAD 99ost, 70p, 55m (entire area covered with a 3.5x20 Synergy DES), LCX nl, RCA 11m, 10d, EF 45-50%.   . DEGENERATIVE [place] DISEASE, THORACIC SPINE 09/16/2009   Qualifier: Diagnosis of  By: [name] MD, [name]    . Dyslipidemia, goal LDL below 70   . Essential hypertension   . Headache   . Ischemic cardiomyopathy - Resolved 06/2014   a. 06/2014 EF 45-50% by LV gram;; b. Echo July 2016: EF 60-65%. No RWMA. Gr 1 DD   . NSTEMI (non-ST elevated myocardial infarction) ([place]) 06/2014  . Obesity (BMI 30-39.9) 10/29/2013  . Primary snoring 10/29/2013  . Rheumatoid arthritis ([place]) 09/23/2013  . RLS (restless legs syndrome) 10/29/2013    Past Surgical History:  Procedure Laterality Date  . CARDIAC CATHETERIZATION N/A 07/05/2014   Procedure: Left Heart Cath and Coronary Angiography;  Surgeon: Troy Sine, MD;  Location: [facility];  Service: Cardiovascular;  NSTEMI --> Ostial LAd 99%, prox 80% & early mid 50% --> PCI  . CARDIAC CATHETERIZATION  07/05/2014   Procedure: Coronary Stent Intervention;  Surgeon: Troy Sine, MD;  Location: [place] [facility];  Service: Cardiovascular; Ostial-prox LAD tandem 99, 80 & 50% --> Synergy DES 3.5 x 20 (3.74)  . CARDIAC CATHETERIZATION N/A 03/24/2015   Procedure: Left Heart Cath and Coronary Angiography;  Surgeon: Leonie Man, MD;   Location: [place] [facility];  Service: Cardiovascular;  Laterality: N/A;  . CHOLECYSTECTOMY     10 yrs ago-jenkins  . COLONOSCOPY N/A 03/23/2013   Procedure: COLONOSCOPY;  Surgeon: Daneil Dolin, MD;  [facility];  Service: Endoscopy;  Laterality: N/A;  2:15-moved to 1030 Staff notified pt  . FRACTIONAL FLOW RESERVE WIRE  11/23/2013   Procedure: FRACTIONAL FLOW RESERVE WIRE;  Surgeon: Jettie Booze, MD;  Location: [facility] CATH LAB;  Service: Cardiovascular;;  . GALLBLADDER SURGERY    . LEFT HEART CATHETERIZATION WITH CORONARY ANGIOGRAM N/A 11/23/2013   Procedure: LEFT HEART CATHETERIZATION WITH CORONARY ANGIOGRAM;  Surgeon: Jettie Booze, MD;  Location: [facility] CATH LAB;  Service: Cardiovascular;  Laterality: N/A;  . MASS EXCISION  09/09/2010   Procedure: EXCISION MASS;  Surgeon: Arther Abbott, MD;  Location[facility];  Service: Orthopedics;  Laterality: Left;  Excision Mass Left Long Finger  . THROAT SURGERY     nodules removed from throat as child  . TRANSTHORACIC ECHOCARDIOGRAM   July 2016:    EF 60-65%. No RWMA. Gr 1 DD    Current Outpatient Prescriptions  Medication Sig Dispense Refill  . amLODipine (NORVASC) 5 MG tablet TAKE 1 TABLET BY MOUTH ONCE A DAY. 30 tablet 6  . aspirin EC 81 MG tablet Take 1 tablet (81 mg total) by mouth daily. 30 tablet 3  . atorvastatin (LIPITOR) 80 MG tablet TAKE ONE TABLET BY MOUTH ONCE DAILY. 30 tablet 8  . BRILINTA 90 MG TABS tablet TAKE (1) TABLET BY MOUTH TWICE DAILY. 60 tablet 10  . clonazePAM (KLONOPIN) 0.5 MG tablet Take 0.5 mg by mouth 2 (two) times daily as needed for anxiety.    . furosemide (LASIX) 20 MG tablet Take 1 tablet (20 mg total) by mouth daily. 30 tablet 6  . InFLIXimab (REMICADE IV) Inject into the vein as directed. Every 6 weeks    . loratadine (CLARITIN) 10 MG tablet Take 10 mg by mouth daily.     Marland Kitchen losartan (COZAAR) 25 MG tablet Take 25 mg  In the morning , and 12.5. mg in the evening 75 tablet 4  . metoprolol  tartrate (LOPRESSOR) 25 MG tablet TAKE (1) TABLET BY MOUTH TWICE DAILY. 60 tablet 10  . nitroGLYCERIN (NITROSTAT) 0.4 MG SL tablet Take one tab sublingual when experiencing chest tightness and shortness of breath. 25 tablet 4  . ranitidine (ZANTAC) 150 MG capsule Take 150 mg by mouth 2 (two) times daily.    . traMADol (ULTRAM) 50 MG tablet Take 50 mg by mouth every 6 (six) hours as needed for moderate pain.      No current facility-administered medications for this visit.     Allergies:   Lisinopril and Prednisone    Social History:  The patient  reports that he quit smoking about 12 years ago. His smoking use included Cigarettes. He smoked 0.25 packs per day. His smokeless tobacco use includes Chew. He reports that he drinks alcohol. He reports that he does  not use drugs.   Family History:  The patient's family history includes Arthritis in his paternal uncle; Cancer in his maternal grandfather; Diabetes in his maternal grandmother, mother, and paternal grandfather; Hypertension in his maternal grandmother and mother.    ROS:  Please see the history of present illness. All other systems are reviewed and negative.    PHYSICAL EXAM: VS:  BP 115/77   Pulse 70   Ht 6\' 1"  (1.854 m)   Wt 261 lb 3.2 oz (118.5 kg)   BMI 34.46 kg/m  , BMI Body mass index is 34.46 kg/m. GEN: Well nourished, well developed, male in no acute distress  HEENT: normal for age  Neck: no JVD, no carotid bruit, no masses Cardiac: RRR; no murmur, no rubs, or gallops Respiratory:  clear to auscultation bilaterally, normal work of breathing GI: soft, nontender, nondistended, + BS MS: no deformity or atrophy; no edema; distal pulses are 2+ in all 4 extremities   Skin: warm and dry, no rash Neuro:  Strength and sensation are intact Psych: euthymic mood, full affect   EKG:  EKG is ordered today. The ekg ordered today demonstrates sinus rhythm, rate 70, T wave inversions in lead 3 only and T-wave flattening in aVF,  unchanged from 10/03/2015 ECG   Recent Labs: 03/19/2015: Pro B Natriuretic peptide (BNP) 18.0 04/16/2015: ALT 40; BUN 16; Creatinine, Ser 0.87; Hemoglobin 15.8; Platelets 149; Potassium 4.1; Sodium 139; TSH 0.707    Lipid Panel    Component Value Date/Time   CHOL 103 04/16/2015 0820   CHOL 114 08/06/2014 0746   TRIG 70 04/16/2015 0820   HDL 28 (L) 04/16/2015 0820   HDL 28 (L) 08/06/2014 0746   CHOLHDL 3.7 04/16/2015 0820   VLDL 14 04/16/2015 0820   LDLCALC 61 04/16/2015 0820   LDLCALC 63 08/06/2014 0746     Wt Readings from Last 3 Encounters:  11/11/15 261 lb 3.2 oz (118.5 kg)  10/03/15 262 lb (118.8 kg)  07/17/15 254 lb 9.6 oz (115.5 kg)     Other studies Reviewed: Additional studies/ records that were reviewed today include: Office notes, hospital records and testing.  ASSESSMENT AND PLAN:  1.  Exertional chest pain: He has a history of CAD. He's been getting his chest pain consistently with exertion in addition to the dyspnea. The symptoms are improved by nitroglycerin.   With his history, and consistence of the symptoms, definitive evaluation with cardiac catheterization is indicated. This will be scheduled at the earliest convenience. He prefers Dr. Claiborne Billings to do the procedure, we will schedule it at Dr. Evette Georges first available date.  He is on good medical therapy with aspirin, metoprolol, ARB, Brilinta, and atorvastatin. He is to continue these. His blood pressure and heart rate are well controlled.   Current medicines are reviewed at length with the patient today.  The patient does not have concerns regarding medicines.  The following changes have been made:  no change  Labs/ tests ordered today include:  No orders of the defined types were placed in this encounter.    Disposition:   FU with Dr. Claiborne Billings or myself after the procedure  Signed, Lenoard Aden  11/11/2015 3:41 PM    [place] Phone: [phone]; Fax: [phone]  This note was written with the assistance of speech recognition software. Please excuse any transcriptional errors.

## 2015-11-11 NOTE — Patient Instructions (Addendum)
Medication Instructions:  NO CHANGE   Labwork: Your physician recommends that you HAVE LABWORK TODAY  Testing/Procedures: Your physician has requested that you have a cardiac catheterization. Cardiac catheterization is used to diagnose and/or treat various heart conditions. Doctors may recommend this procedure for a number of different reasons. The most common reason is to evaluate chest pain. Chest pain can be a symptom of coronary artery disease (CAD), and cardiac catheterization can show whether plaque is narrowing or blocking your heart's arteries. This procedure is also used to evaluate the valves, as well as measure the blood flow and oxygen levels in different parts of your heart. For further information please visit [email or web address]. Please follow instruction sheet, as given.    Follow-Up: IN 3-4 WEEKS WITH Richard Simpson  Any Other Special Instructions Will Be Listed Below (If Applicable).    If you need a refill on your cardiac medications before your next appointment, please call your pharmacy.

## 2015-11-12 LAB — BASIC METABOLIC PANEL WITH GFR
BUN: 21 mg/dL (ref 7–25)
CALCIUM: 9.3 mg/dL (ref 8.6–10.3)
CO2: 25 mmol/L (ref 20–31)
Chloride: 105 mmol/L (ref 98–110)
Creat: 1.04 mg/dL (ref 0.60–1.35)
GFR, EST NON AFRICAN AMERICAN: 87 mL/min (ref >=60)
Glucose, Bld: 88 mg/dL (ref 65–99)
Potassium: 4.3 mmol/L (ref 3.5–5.3)
SODIUM: 140 mmol/L (ref 135–146)

## 2015-11-12 LAB — CBC
HCT: 46.1 % (ref 38.5–50.0)
Hemoglobin: 16.1 g/dL (ref 13.2–17.1)
MCH: 32.7 pg (ref 27.0–33.0)
MCHC: 34.9 g/dL (ref 32.0–36.0)
MCV: 93.7 fL (ref 80.0–100.0)
MPV: 10.1 fL (ref 7.5–12.5)
PLATELETS: 186 10*3/uL (ref 140–400)
RBC: 4.92 MIL/uL (ref 4.20–5.80)
RDW: 12.7 % (ref 11.0–15.0)
WBC: 6.4 10*3/uL (ref 3.8–10.8)

## 2015-11-12 LAB — PROTIME-INR
INR: 1
Prothrombin Time: 10.7 s (ref 9.0–11.5)

## 2015-11-13 ENCOUNTER — Encounter: Payer: Self-pay | Admitting: Physician Assistant

## 2015-11-19 ENCOUNTER — Other Ambulatory Visit: Payer: Self-pay | Admitting: Cardiovascular Disease

## 2015-11-19 ENCOUNTER — Other Ambulatory Visit: Payer: Self-pay | Admitting: Cardiology

## 2015-11-20 ENCOUNTER — Ambulatory Visit (HOSPITAL_COMMUNITY)
Admission: RE | Admit: 2015-11-20 | Discharge: 2015-11-20 | Disposition: A | Discharge status: Home / Self Care | Payer: PRIVATE HEALTH INSURANCE | Source: Ambulatory Visit | Attending: Cardiovascular Disease | Admitting: Cardiovascular Disease

## 2015-11-20 ENCOUNTER — Encounter (HOSPITAL_COMMUNITY)
Admission: RE | Disposition: A | Discharge status: Home / Self Care | Payer: Self-pay | Source: Ambulatory Visit | Attending: Cardiovascular Disease

## 2015-11-20 ENCOUNTER — Other Ambulatory Visit: Payer: Self-pay | Admitting: *Deleted

## 2015-11-20 DIAGNOSIS — F17220 Nicotine dependence, chewing tobacco, uncomplicated: Secondary | ICD-10-CM | POA: Diagnosis not present

## 2015-11-20 DIAGNOSIS — Z7982 Long term (current) use of aspirin: Secondary | ICD-10-CM | POA: Diagnosis not present

## 2015-11-20 DIAGNOSIS — M5134 Other intervertebral disc degeneration, thoracic region: Secondary | ICD-10-CM | POA: Insufficient documentation

## 2015-11-20 DIAGNOSIS — E785 Hyperlipidemia, unspecified: Secondary | ICD-10-CM | POA: Insufficient documentation

## 2015-11-20 DIAGNOSIS — I2510 Atherosclerotic heart disease of native coronary artery without angina pectoris: Secondary | ICD-10-CM

## 2015-11-20 DIAGNOSIS — R0789 Other chest pain: Secondary | ICD-10-CM | POA: Insufficient documentation

## 2015-11-20 DIAGNOSIS — M069 Rheumatoid arthritis, unspecified: Secondary | ICD-10-CM | POA: Diagnosis not present

## 2015-11-20 DIAGNOSIS — I252 Old myocardial infarction: Secondary | ICD-10-CM | POA: Insufficient documentation

## 2015-11-20 DIAGNOSIS — Z955 Presence of coronary angioplasty implant and graft: Secondary | ICD-10-CM | POA: Insufficient documentation

## 2015-11-20 DIAGNOSIS — G2581 Restless legs syndrome: Secondary | ICD-10-CM | POA: Insufficient documentation

## 2015-11-20 DIAGNOSIS — E669 Obesity, unspecified: Secondary | ICD-10-CM | POA: Diagnosis not present

## 2015-11-20 DIAGNOSIS — I10 Essential (primary) hypertension: Secondary | ICD-10-CM | POA: Diagnosis not present

## 2015-11-20 DIAGNOSIS — Z6834 Body mass index (BMI) 34.0-34.9, adult: Secondary | ICD-10-CM | POA: Insufficient documentation

## 2015-11-20 HISTORY — PX: CARDIAC CATHETERIZATION: SHX172

## 2015-11-20 SURGERY — LEFT HEART CATH AND CORONARY ANGIOGRAPHY
Anesthesia: LOCAL

## 2015-11-20 MED ORDER — SODIUM CHLORIDE 0.9 % WEIGHT BASED INFUSION: 3.0000 mL/kg/h | INTRAVENOUS | Status: AC

## 2015-11-20 MED ORDER — SODIUM CHLORIDE 0.9 % IV SOLN: 250.0000 mL | INTRAVENOUS | Status: DC | PRN

## 2015-11-20 MED ORDER — SODIUM CHLORIDE 0.9% FLUSH: 3.0000 mL | INTRAVENOUS | Status: DC | PRN

## 2015-11-20 MED ORDER — SODIUM CHLORIDE 0.9% FLUSH: 3.0000 mL | Freq: Two times a day (BID) | INTRAVENOUS | Status: DC

## 2015-11-20 MED ORDER — HEPARIN (PORCINE) IN NACL 2-0.9 UNIT/ML-% IJ SOLN
INTRAMUSCULAR | Status: DC | PRN
  Administered 2015-11-20: 11:00:00

## 2015-11-20 MED ORDER — SODIUM CHLORIDE 0.9% FLUSH
3.0000 mL | Freq: Two times a day (BID) | INTRAVENOUS | Status: DC
Start: 2015-11-20 — End: 2015-11-20

## 2015-11-20 MED ORDER — ASPIRIN 81 MG PO CHEW
324.0000 mg | CHEWABLE_TABLET | ORAL | Status: AC
  Administered 2015-11-20: 243 mg via ORAL

## 2015-11-20 MED ORDER — LIDOCAINE HCL (PF) 1 % IJ SOLN
INTRAMUSCULAR | Status: AC
  Filled 2015-11-20: qty 30

## 2015-11-20 MED ORDER — IOPAMIDOL (ISOVUE-370) INJECTION 76%
INTRAVENOUS | Status: AC
  Filled 2015-11-20: qty 100

## 2015-11-20 MED ORDER — HEPARIN SODIUM (PORCINE) 1000 UNIT/ML IJ SOLN
INTRAMUSCULAR | Status: AC
  Filled 2015-11-20: qty 1

## 2015-11-20 MED ORDER — ONDANSETRON HCL 4 MG/2ML IJ SOLN: 4.0000 mg | Freq: Four times a day (QID) | INTRAMUSCULAR | Status: DC | PRN

## 2015-11-20 MED ORDER — HEPARIN (PORCINE) IN NACL 2-0.9 UNIT/ML-% IJ SOLN
INTRAMUSCULAR | Status: AC
  Filled 2015-11-20: qty 1000

## 2015-11-20 MED ORDER — VERAPAMIL HCL 2.5 MG/ML IV SOLN
INTRAVENOUS | Status: AC
  Filled 2015-11-20: qty 2

## 2015-11-20 MED ORDER — LIDOCAINE HCL (PF) 1 % IJ SOLN
INTRAMUSCULAR | Status: DC | PRN
  Administered 2015-11-20: 2 mL via SUBCUTANEOUS

## 2015-11-20 MED ORDER — LOSARTAN POTASSIUM 25 MG PO TABS: ORAL_TABLET | ORAL | 3 refills | Status: DC

## 2015-11-20 MED ORDER — DIAZEPAM 5 MG PO TABS: 5.0000 mg | ORAL_TABLET | Freq: Four times a day (QID) | ORAL | Status: DC | PRN

## 2015-11-20 MED ORDER — ASPIRIN 81 MG PO CHEW
CHEWABLE_TABLET | ORAL | Status: AC
  Administered 2015-11-20: 243 mg via ORAL
  Filled 2015-11-20: qty 4

## 2015-11-20 MED ORDER — MIDAZOLAM HCL 2 MG/2ML IJ SOLN
INTRAMUSCULAR | Status: DC | PRN
  Administered 2015-11-20: 1 mg via INTRAVENOUS
  Administered 2015-11-20: 2 mg via INTRAVENOUS

## 2015-11-20 MED ORDER — IOPAMIDOL (ISOVUE-370) INJECTION 76%
INTRAVENOUS | Status: DC | PRN
  Administered 2015-11-20: 90 mL

## 2015-11-20 MED ORDER — SODIUM CHLORIDE 0.9 % WEIGHT BASED INFUSION: 1.0000 mL/kg/h | INTRAVENOUS | Status: DC

## 2015-11-20 MED ORDER — VERAPAMIL HCL 2.5 MG/ML IV SOLN
INTRAVENOUS | Status: DC | PRN
  Administered 2015-11-20: 11:00:00 via INTRA_ARTERIAL

## 2015-11-20 MED ORDER — FENTANYL CITRATE (PF) 100 MCG/2ML IJ SOLN
INTRAMUSCULAR | Status: DC | PRN
  Administered 2015-11-20: 50 ug via INTRAVENOUS
  Administered 2015-11-20: 25 ug via INTRAVENOUS

## 2015-11-20 MED ORDER — ACETAMINOPHEN 325 MG PO TABS: 650.0000 mg | ORAL_TABLET | ORAL | Status: DC | PRN

## 2015-11-20 MED ORDER — MIDAZOLAM HCL 2 MG/2ML IJ SOLN
INTRAMUSCULAR | Status: AC
  Filled 2015-11-20: qty 2

## 2015-11-20 MED ORDER — HEPARIN SODIUM (PORCINE) 1000 UNIT/ML IJ SOLN
INTRAMUSCULAR | Status: DC | PRN
  Administered 2015-11-20: 5000 [IU] via INTRAVENOUS

## 2015-11-20 MED ORDER — FENTANYL CITRATE (PF) 100 MCG/2ML IJ SOLN
INTRAMUSCULAR | Status: AC
  Filled 2015-11-20: qty 2

## 2015-11-20 MED ORDER — SODIUM CHLORIDE 0.9 % WEIGHT BASED INFUSION
3.0000 mL/kg/h | INTRAVENOUS | Status: DC
  Administered 2015-11-20: 3 mL/kg/h via INTRAVENOUS

## 2015-11-20 SURGICAL SUPPLY — 13 items
CATH INFINITI 5FR ANG PIGTAIL (CATHETERS) ×1 IMPLANT
CATH INFINITI 5FR JL4 (CATHETERS) ×1 IMPLANT
CATH INFINITI JR4 5F (CATHETERS) ×1 IMPLANT
CATH OPTITORQUE TIG 4.0 5F (CATHETERS) ×1 IMPLANT
DEVICE RAD COMP TR BAND LRG (VASCULAR PRODUCTS) ×1 IMPLANT
GLIDESHEATH SLEND SS 6F .021 (SHEATH) ×1 IMPLANT
KIT ENCORE 26 ADVANTAGE (KITS) ×1 IMPLANT
KIT HEART LEFT (KITS) ×2 IMPLANT
PACK CARDIAC CATHETERIZATION (CUSTOM PROCEDURE TRAY) ×2 IMPLANT
SYR MEDRAD MARK V 150ML (SYRINGE) ×2 IMPLANT
TRANSDUCER W/STOPCOCK (MISCELLANEOUS) ×2 IMPLANT
TUBING CIL FLEX 10 FLL-RA (TUBING) ×2 IMPLANT
WIRE SAFE-T 1.5MM-J .035X260CM (WIRE) ×1 IMPLANT

## 2015-11-20 NOTE — Discharge Instructions (Signed)
Radial Site Care °Refer to this sheet in the next few weeks. These instructions provide you with information about caring for yourself after your procedure. Your health care provider may also give you more specific instructions. Your treatment has been planned according to current medical practices, but problems sometimes occur. Call your health care provider if you have any problems or questions after your procedure. °WHAT TO EXPECT AFTER THE PROCEDURE °After your procedure, it is typical to have the following: °· Bruising at the radial site that usually fades within 1-2 weeks. °· Blood collecting in the tissue (hematoma) that may be painful to the touch. It should usually decrease in size and tenderness within 1-2 weeks. °HOME CARE INSTRUCTIONS °· Take medicines only as directed by your health care provider. °· You may shower 24-48 hours after the procedure or as directed by your health care provider. Remove the bandage (dressing) and gently wash the site with plain soap and water. Pat the area dry with a clean towel. Do not rub the site, because this may cause bleeding. °· Do not take baths, swim, or use a hot tub until your health care provider approves. °· Check your insertion site every day for redness, swelling, or drainage. °· Do not apply powder or lotion to the site. °· Do not flex or bend the affected arm for 24 hours or as directed by your health care provider. °· Do not push or pull heavy objects with the affected arm for 24 hours or as directed by your health care provider. °· Do not lift over 10 lb (4.5 kg) for 5 days after your procedure or as directed by your health care provider. °· Ask your health care provider when it is okay to: °¨ Return to work or school. °¨ Resume usual physical activities or sports. °¨ Resume sexual activity. °· Do not drive home if you are discharged the same day as the procedure. Have someone else drive you. °· You may drive 24 hours after the procedure unless otherwise  instructed by your health care provider. °· Do not operate machinery or power tools for 24 hours after the procedure. °· If your procedure was done as an outpatient procedure, which means that you went home the same day as your procedure, a responsible adult should be with you for the first 24 hours after you arrive home. °· Keep all follow-up visits as directed by your health care provider. This is important. °SEEK MEDICAL CARE IF: °· You have a fever. °· You have chills. °· You have increased bleeding from the radial site. Hold pressure on the site. CALL 911 °SEEK IMMEDIATE MEDICAL CARE IF: °· You have unusual pain at the radial site. °· You have redness, warmth, or swelling at the radial site. °· You have drainage (other than a small amount of blood on the dressing) from the radial site. °· The radial site is bleeding, and the bleeding does not stop after 30 minutes of holding steady pressure on the site. °· Your arm or hand becomes pale, cool, tingly, or numb. °  °This information is not intended to replace advice given to you by your health care provider. Make sure you discuss any questions you have with your health care provider. °  °Document Released: 02/13/2010 Document Revised: 02/01/2014 Document Reviewed: 07/30/2013 °Elsevier Interactive Patient Education ©2016 [employer] ° °

## 2015-11-20 NOTE — Interval H&P Note (Signed)
Cath Lab Visit (complete for each Cath Lab visit)  Clinical Evaluation Leading to the Procedure:   ACS: No.  Non-ACS:    Anginal Classification: CCS III  Anti-ischemic medical therapy: Maximal Therapy (2 or more classes of medications)  Non-Invasive Test Results: No non-invasive testing performed  Prior CABG: No previous CABG      History and Physical Interval Note:  11/20/2015 10:18 AM  Richard Simpson  has presented today for surgery, with the diagnosis of angina  The various methods of treatment have been discussed with the patient and family. After consideration of risks, benefits and other options for treatment, the patient has consented to  Procedure(s): Left Heart Cath and Coronary Angiography (N/A) as a surgical intervention .  The patient's history has been reviewed, patient examined, no change in status, stable for surgery.  I have reviewed the patient's chart and labs.  Questions were answered to the patient's satisfaction.     Richard Simpson

## 2015-11-20 NOTE — H&P (View-Only) (Signed)
Cardiology Office Note   Date:  11/11/2015   ID:  ELLIAN DURKEE, DOB 1970/02/22, MRN [id number]  PCP:  Richard Kilts, MD  Cardiologist:  Dr Richard Simpson 06/2015 Richard Ferries, PA-C  10/03/2015  Chief Complaint  Patient presents with  . Chest Pain  . Shortness of Breath    History of Present Illness: Richard Simpson is a 45 y.o. male with a history of NSTEMI w/ DES LAD 06/2014, HTN, HLD, ICM w/ EF 45-50% at cath>>EF 60-65% by echo 07/2014, obesity  09/08, patient seen for dyspnea on exertion. He had a CPX 09/14, Conclusion: Exercise testing with gas exchange demonstrates a mild functional impairment when compared to matched sedentary norms. Test was terminated slightly early to maximal effort, however, there is no indication of cardiopulmonary limitation. Given pre-exercise spirometry and ideal weight adjustment in addition to low breathing reserve, patient is most likely limited due to his body habitus.   Richard Simpson presents for Ongoing evaluation and treatment of his dyspnea on exertion and chest pain.  Richard Simpson is very concerned about his symptoms. He states that the stress test he had before his heart attack was fine. He states that the dyspnea on exertion and chest pain has been getting worse. He feels like he cannot hardly do anything without getting short of breath and getting pain. He is pushing himself to increase his activity, but just has to stop for breath after very brief intervals. He feels that his activity level is significantly decreased from what it used to be. He has been trying to be more active and push himself, but without any success at all.  His wife reports that when he came back inside from exercising one time, he was very short of breath and had significant chest pain. She states that he was white around the lips and generally very pale/diaphoretic. The symptoms always improve with rest, but the other night, they were more severe and so he took a  nitroglycerin. The nitroglycerin helped his pain. Since then he has taken nitroglycerin several times, always with improvement in his pain.  He denies orthopnea or PND. He has not had lower extremity edema. He has been trying to eat better and lose some weight, but is very difficult for him because he cannot increase his activity. He is compliant with his medications.  This extremely concerned about his symptoms, and wants a definitive answer.   Past Medical History:  Diagnosis Date  . CAD S/P percutaneous coronary angioplasty 10/2013   a. 10/2013 Cath: mod LAD dzs->nl FFR-> medically managed;  b. 06/2014 NSTEMI/PCi: LAD 99ost, 70p, 3m (entire area covered with a 3.5x20 Synergy DES), LCX nl, RCA 36m, 10d, EF 45-50%.   . 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 Dyslipidemia, goal LDL below 70   . Essential hypertension   . Headache   . Ischemic cardiomyopathy - Resolved 06/2014   a. 06/2014 EF 45-50% by LV gram;; b. Echo July 2016: EF 60-65%. No RWMA. Gr 1 DD   . NSTEMI (non-ST elevated myocardial infarction) ([place]) 06/2014  . Obesity (BMI 30-39.9) 10/29/2013  . Primary snoring 10/29/2013  . Rheumatoid arthritis ([place]) 09/23/2013  . RLS (restless legs syndrome) 10/29/2013    Past Surgical History:  Procedure Laterality Date  . CARDIAC CATHETERIZATION N/A 07/05/2014   Procedure: Left Heart Cath and Coronary Angiography;  Surgeon: Richard Sine, MD;  Location: [facility];  Service: Cardiovascular;  NSTEMI --> Ostial LAd 99%, prox 80% & early mid 50% --> PCI  . CARDIAC CATHETERIZATION  07/05/2014   Procedure: Coronary Stent Intervention;  Surgeon: Richard Sine, MD;  Location: [place] [facility];  Service: Cardiovascular; Ostial-prox LAD tandem 99, 80 & 50% --> Synergy DES 3.5 x 20 (3.74)  . CARDIAC CATHETERIZATION N/A 03/24/2015   Procedure: Left Heart Cath and Coronary Angiography;  Surgeon: Richard Man, MD;   Location: [place];  Service: Cardiovascular;  Laterality: N/A;  . CHOLECYSTECTOMY     10 yrs ago-[facility]  . COLONOSCOPY N/A 03/23/2013   Procedure: COLONOSCOPY;  Surgeon: Richard Dolin, MD;  [facility];  Service: Endoscopy;  Laterality: N/A;  2:15-moved to 1030 Staff notified pt  . FRACTIONAL FLOW RESERVE WIRE  11/23/2013   Procedure: FRACTIONAL FLOW RESERVE WIRE;  Surgeon: Richard Booze, MD;  Location: [facility] C[facility];  Service: Cardiovascular;;  . GALLBLADDER SURGERY    . LEFT HEART CATHETERIZATION WITH CORONARY ANGIOGRAM N/A 11/23/2013   Procedure: LEFT HEART CATHETERIZATION WITH CORONARY ANGIOGRAM;  Surgeon: Richard Booze, MD;  Location: [facility] CATH LAB;  Service: Cardiovascular;  Laterality: N/A;  . MASS EXCISION  09/09/2010   Procedure: EXCISION MASS;  Surgeon: Richard Abbott, MD;  Location: [facility];  Service: Orthopedics;  Laterality: Left;  Excision Mass Left Long Finger  . THROAT SURGERY     nodules removed from throat as child  . TRANSTHORACIC ECHOCARDIOGRAM   July 2016:    EF 60-65%. No RWMA. Gr 1 DD    Current Outpatient Prescriptions  Medication Sig Dispense Refill  . amLODipine (NORVASC) 5 MG tablet TAKE 1 TABLET BY MOUTH ONCE A DAY. 30 tablet 6  . aspirin EC 81 MG tablet Take 1 tablet (81 mg total) by mouth daily. 30 tablet 3  . atorvastatin (LIPITOR) 80 MG tablet TAKE ONE TABLET BY MOUTH ONCE DAILY. 30 tablet 8  . BRILINTA 90 MG TABS tablet TAKE (1) TABLET BY MOUTH TWICE DAILY. 60 tablet 10  . clonazePAM (KLONOPIN) 0.5 MG tablet Take 0.5 mg by mouth 2 (two) times daily as needed for anxiety.    . furosemide (LASIX) 20 MG tablet Take 1 tablet (20 mg total) by mouth daily. 30 tablet 6  . InFLIXimab (REMICADE IV) Inject into the vein as directed. Every 6 weeks    . loratadine (CLARITIN) 10 MG tablet Take 10 mg by mouth daily.     Marland Kitchen losartan (COZAAR) 25 MG tablet Take 25 mg  In the morning , and 12.5. mg in the evening 75 tablet 4  . metoprolol  tartrate (LOPRESSOR) 25 MG tablet TAKE (1) TABLET BY MOUTH TWICE DAILY. 60 tablet 10  . nitroGLYCERIN (NITROSTAT) 0.4 MG SL tablet Take one tab sublingual when experiencing chest tightness and shortness of breath. 25 tablet 4  . ranitidine (ZANTAC) 150 MG capsule Take 150 mg by mouth 2 (two) times daily.    . traMADol (ULTRAM) 50 MG tablet Take 50 mg by mouth every 6 (six) hours as needed for moderate pain.      No current facility-administered medications for this visit.     Allergies:   Lisinopril and Prednisone    Social History:  The patient  reports that he quit smoking about 12 years ago. His smoking use included Cigarettes. He smoked 0.25 packs per day. His smokeless tobacco use includes Chew. He reports that he drinks alcohol. He reports that he does  not use drugs.   Family History:  The patient's family history includes Arthritis in his paternal uncle; Cancer in his maternal grandfather; Diabetes in his maternal grandmother, mother, and paternal grandfather; Hypertension in his maternal grandmother and mother.    ROS:  Please see the history of present illness. All other systems are reviewed and negative.    PHYSICAL EXAM: VS:  BP 115/77   Pulse 70   Ht 6\' 1"  (1.854 m)   Wt 261 lb 3.2 oz (118.5 kg)   BMI 34.46 kg/m  , BMI Body mass index is 34.46 kg/m. GEN: Well nourished, well developed, male in no acute distress  HEENT: normal for age  Neck: no JVD, no carotid bruit, no masses Cardiac: RRR; no murmur, no rubs, or gallops Respiratory:  clear to auscultation bilaterally, normal work of breathing GI: soft, nontender, nondistended, + BS MS: no deformity or atrophy; no edema; distal pulses are 2+ in all 4 extremities   Skin: warm and dry, no rash Neuro:  Strength and sensation are intact Psych: euthymic mood, full affect   EKG:  EKG is ordered today. The ekg ordered today demonstrates sinus rhythm, rate 70, T wave inversions in lead 3 only and T-wave flattening in aVF,  unchanged from 10/03/2015 ECG   Recent Labs: 03/19/2015: Pro B Natriuretic peptide (BNP) 18.0 04/16/2015: ALT 40; BUN 16; Creatinine, Ser 0.87; Hemoglobin 15.8; Platelets 149; Potassium 4.1; Sodium 139; TSH 0.707    Lipid Panel    Component Value Date/Time   CHOL 103 04/16/2015 0820   CHOL 114 08/06/2014 0746   TRIG 70 04/16/2015 0820   HDL 28 (L) 04/16/2015 0820   HDL 28 (L) 08/06/2014 0746   CHOLHDL 3.7 04/16/2015 0820   VLDL 14 04/16/2015 0820   LDLCALC 61 04/16/2015 0820   LDLCALC 63 08/06/2014 0746     Wt Readings from Last 3 Encounters:  11/11/15 261 lb 3.2 oz (118.5 kg)  10/03/15 262 lb (118.8 kg)  07/17/15 254 lb 9.6 oz (115.5 kg)     Other studies Reviewed: Additional studies/ records that were reviewed today include: Office notes, hospital records and testing.  ASSESSMENT AND PLAN:  1.  Exertional chest pain: He has a history of CAD. He's been getting his chest pain consistently with exertion in addition to the dyspnea. The symptoms are improved by nitroglycerin.   With his history, and consistence of the symptoms, definitive evaluation with cardiac catheterization is indicated. This will be scheduled at the earliest convenience. He prefers Dr. Claiborne Simpson to do the procedure, we will schedule it at Dr. Evette Georges first available date.  He is on good medical therapy with aspirin, metoprolol, ARB, Brilinta, and atorvastatin. He is to continue these. His blood pressure and heart rate are well controlled.   Current medicines are reviewed at length with the patient today.  The patient does not have concerns regarding medicines.  The following changes have been made:  no change  Labs/ tests ordered today include:  No orders of the defined types were placed in this encounter.    Disposition:   FU with Dr. Claiborne Simpson or myself after the procedure  Signed, Lenoard Aden  11/11/2015 3:41 PM    [place] Phone: [phone]; Fax: [phone]  This note was written with the assistance of speech recognition software. Please excuse any transcriptional errors.

## 2015-11-21 ENCOUNTER — Encounter (HOSPITAL_COMMUNITY): Payer: Self-pay | Admitting: Cardiovascular Disease

## 2015-12-02 ENCOUNTER — Encounter: Payer: Self-pay | Admitting: Physician Assistant

## 2015-12-04 ENCOUNTER — Ambulatory Visit (INDEPENDENT_AMBULATORY_CARE_PROVIDER_SITE_OTHER): Payer: PRIVATE HEALTH INSURANCE | Admitting: Physician Assistant

## 2015-12-04 ENCOUNTER — Encounter: Payer: Self-pay | Admitting: Physician Assistant

## 2015-12-04 VITALS — BP 111/76 | HR 75 | Ht 73.0 in | Wt 259.8 lb

## 2015-12-04 DIAGNOSIS — R0609 Other forms of dyspnea: Secondary | ICD-10-CM

## 2015-12-04 DIAGNOSIS — I2510 Atherosclerotic heart disease of native coronary artery without angina pectoris: Secondary | ICD-10-CM | POA: Diagnosis not present

## 2015-12-04 DIAGNOSIS — R0600 Dyspnea, unspecified: Secondary | ICD-10-CM

## 2015-12-04 DIAGNOSIS — R072 Precordial pain: Secondary | ICD-10-CM

## 2015-12-04 MED ORDER — AMLODIPINE BESYLATE 5 MG PO TABS: 2.5000 mg | ORAL_TABLET | Freq: Every day | ORAL | 6 refills | Status: DC

## 2015-12-04 MED ORDER — ISOSORBIDE MONONITRATE ER 30 MG PO TB24: 15.0000 mg | ORAL_TABLET | Freq: Every day | ORAL | 3 refills | Status: DC

## 2015-12-04 NOTE — Progress Notes (Signed)
Cardiology Office Note   Date:  12/04/2015   ID:  [name], DOB June 29, 1970, MRN [id number]  PCP:  Purvis Kilts, MD  Cardiologist:  Dr Claiborne Billings 06/2015  Rosaria Ferries, PA-C 11/11/2015  Chief Complaint  Patient presents with  . Follow-up    post cath    History of Present Illness: Richard Simpson is a 45 y.o. male with a history of NSTEMI w/ DES LAD 06/2014, HTN, HLD, ICM w/ EF 45-50% at cath>>EF 60-65% by echo 07/2014, obesity  09/08, patient seen for dyspnea on exertion. He had a CPX 09/14, Given pre-exercise spirometry and ideal weight adjustment in addition to low breathing reserve, patient is most likely limited due to his body habitus.  10/17, patient seen is still having symptoms. In the meantime, he had also developed exertional chest pain. 10/26, Cardiac catheterization was scheduled and performed, no significant residual CAD with stent patent, EF normal  Richard Simpson presents for post-hospital followup. His wife is here with him today.  Since d/c from the hospital, his symptoms have not changed much. He has continued to have problems with chest pain and SOB. He is trying to be active but feels especially limited. This is frustrating to him. He is going to the gym but not at his previous level. He is walking, his daughter is home right now and goes with him. After a workout that is a lower level than previous, he is exhausted.   He wonders if the SOB and chest pain is coming from the Remicade. After his MI, he was doing very well until the Remicade was started and increased. It controls his RA well, but he is thinking about stopping it to see if the CP/SOB improves.  He denies LE edema. He is being mindful about what he eats and trying to decrease his weight. He is compliant with his medications. He is not having LE edema, orthopnea or PND.   Previously, he did not tolerate Imdur because of orthostatic dizziness and hypotension. He did not take it long enough  to know if it helped his symptoms or not.   Past Medical History:  Diagnosis Date  . CAD S/P percutaneous coronary angioplasty 10/2013   a. 10/2013 Cath: mod LAD dzs->nl FFR-> medically managed;  b. 06/2014 NSTEMI/PCi: LAD 99ost, 70p, 68m (entire area covered with a 3.5x20 Synergy DES), LCX nl, RCA 55m, 10d, EF 45-50%.   . 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 Dyslipidemia, goal LDL below 70   . Essential hypertension   . Headache   . Ischemic cardiomyopathy - Resolved 06/2014   a. 06/2014 EF 45-50% by LV gram;; b. Echo July 2016: EF 60-65%. No RWMA. Gr 1 DD   . NSTEMI (non-ST elevated myocardial infarction) ([place]) 06/2014  . Obesity (BMI 30-39.9) 10/29/2013  . Primary snoring 10/29/2013  . Rheumatoid arthritis ([place]) 09/23/2013  . RLS (restless legs syndrome) 10/29/2013    Past Surgical History:  Procedure Laterality Date  . CARDIAC CATHETERIZATION N/A 07/05/2014   Procedure: Left Heart Cath and Coronary Angiography;  Surgeon: Troy Sine, MD;  Location: [place] [facility];  Service: Cardiovascular;  NSTEMI --> Ostial LAd 99%, prox 80% & early mid 50% --> PCI  . CARDIAC CATHETERIZATION  07/05/2014   Procedure: Coronary Stent Intervention;  Surgeon: Troy Sine, MD;  Location: [place] [facility];  Service: Cardiovascular; Ostial-prox LAD tandem 99, 80 &  50% --> Synergy DES 3.5 x 20 (3.74)  . CARDIAC CATHETERIZATION N/A 03/24/2015   Procedure: Left Heart Cath and Coronary Angiography;  Surgeon: Leonie Man, MD;  Location: [place] [facility];  Service: Cardiovascular;  Laterality: N/A;  . CARDIAC CATHETERIZATION N/A 11/20/2015   Procedure: Left Heart Cath and Coronary Angiography;  Surgeon: Troy Sine, MD;  Location: [place] [facility];  Service: Cardiovascular;  Laterality: N/A;  . CHOLECYSTECTOMY     10 yrs ago-[facility]  . COLONOSCOPY N/A 03/23/2013   Procedure: COLONOSCOPY;  Surgeon: Daneil Dolin, MD;   [facility]: [facility];  Service: Endoscopy;  Laterality: N/A;  2:15-moved to 1030 Staff notified pt  . FRACTIONAL FLOW RESERVE WIRE  11/23/2013   Procedure: FRACTIONAL FLOW RESERVE WIRE;  Surgeon: Jettie Booze, MD;  Location[facility];  Service: Cardiovascular;;  . GALLBLADDER SURGERY    . LEFT HEART CATHETERIZATION WITH CORONARY ANGIOGRAM N/A 11/23/2013   Procedure: LEFT HEART CATHETERIZATION WITH CORONARY ANGIOGRAM;  Surgeon: Jettie Booze, MD;  Location: [facility] CATH LAB;  Service: Cardiovascular;  Laterality: N/A;  . MASS EXCISION  09/09/2010   Procedure: EXCISION MASS;  Surgeon: Arther Abbott, MD;  Location: [facility];  Service: Orthopedics;  Laterality: Left;  Excision Mass Left Long Finger  . THROAT SURGERY     nodules removed from throat as child  . TRANSTHORACIC ECHOCARDIOGRAM   July 2016:    EF 60-65%. No RWMA. Gr 1 DD    Current Outpatient Prescriptions  Medication Sig Dispense Refill  . amLODipine (NORVASC) 5 MG tablet TAKE 1 TABLET BY MOUTH ONCE A DAY. 30 tablet 6  . aspirin EC 81 MG tablet Take 1 tablet (81 mg total) by mouth daily. 30 tablet 3  . atorvastatin (LIPITOR) 80 MG tablet TAKE ONE TABLET BY MOUTH ONCE DAILY. 30 tablet 8  . BRILINTA 90 MG TABS tablet TAKE (1) TABLET BY MOUTH TWICE DAILY. 60 tablet 10  . clonazePAM (KLONOPIN) 0.5 MG tablet Take 0.5 mg by mouth 2 (two) times daily as needed for anxiety.    . furosemide (LASIX) 20 MG tablet TAKE ONE TABLET BY MOUTH ONCE DAILY. 30 tablet 0  . InFLIXimab (REMICADE IV) Inject into the vein as directed. Every 6 weeks    . loratadine (CLARITIN) 10 MG tablet Take 10 mg by mouth daily.     Marland Kitchen losartan (COZAAR) 25 MG tablet Take 25 mg  In the morning , and 12.5. mg in the evening 135 tablet 3  . metoprolol tartrate (LOPRESSOR) 25 MG tablet TAKE (1) TABLET BY MOUTH TWICE DAILY. 60 tablet 10  . nitroGLYCERIN (NITROSTAT) 0.4 MG SL tablet Take one tab sublingual when experiencing chest tightness and shortness of breath.  25 tablet 4  . ranitidine (ZANTAC) 150 MG capsule Take 150 mg by mouth 2 (two) times daily.    . traMADol (ULTRAM) 50 MG tablet Take 50 mg by mouth every 6 (six) hours as needed for moderate pain.      No current facility-administered medications for this visit.     Allergies:   Lisinopril and Prednisone    Social History:  The patient  reports that he quit smoking about 12 years ago. His smoking use included Cigarettes. He smoked 0.25 packs per day. His smokeless tobacco use includes Chew. He reports that he drinks alcohol. He reports that he does not use drugs.   Family History:  The patient's family history includes Arthritis in his paternal uncle; Cancer in his  maternal grandfather; Diabetes in his maternal grandmother, mother, and paternal grandfather; Hypertension in his maternal grandmother and mother.    ROS:  Please see the history of present illness. All other systems are reviewed and negative.    PHYSICAL EXAM: VS:  BP 111/76   Pulse 75   Ht 6\' 1"  (1.854 m)   Wt 259 lb 12.8 oz (117.8 kg)   BMI 34.28 kg/m  , BMI Body mass index is 34.28 kg/m. GEN: Well nourished, well developed, male in no acute distress  HEENT: normal for age  Neck: no JVD, no carotid bruit, no masses Cardiac: RRR; no murmur, no rubs, or gallops Respiratory:  clear to auscultation bilaterally, normal work of breathing GI: soft, nontender, nondistended, + BS MS: no deformity or atrophy; no edema; distal pulses are 2+ in all 4 extremities. R radial cath site is well-healed.  Skin: warm and dry, no rash Neuro:  Strength and sensation are intact Psych: euthymic mood, full affect   EKG:  EKG is not ordered today.  CATH: 11/20/2015  Ost LAD lesion, 0 %stenosed.  Prox LAD to Mid LAD lesion, 15 %stenosed.  The left ventricular ejection fraction is 55-65% by visual estimate.  LV end diastolic pressure is mildly elevated.  No residual significant CAD with a widely patent LAD stent extending from the  ostium extending proximally and ending prior to the takeoff of the first diagonal vessel.  There is mild 15% narrowing in the LAD after the diagonal vessel, which is actually improved from previously. The left circumflex, ramus intermediate  and the dominant RCA vessels are normal. Normal LV function with an ejection fraction at approximately 60-65% without focal segmental wall motion abnormalities. RECOMMENDATION: Medical therapy.  Consider possible noncardiac etiology to the patient's nitrate responsive chest pain.   Recent Labs: 03/19/2015: Pro B Natriuretic peptide (BNP) 18.0 04/16/2015: ALT 40; TSH 0.707 11/11/2015: BUN 21; Creat 1.04; Hemoglobin 16.1; Platelets 186; Potassium 4.3; Sodium 140    Lipid Panel    Component Value Date/Time   CHOL 103 04/16/2015 0820   CHOL 114 08/06/2014 0746   TRIG 70 04/16/2015 0820   HDL 28 (L) 04/16/2015 0820   HDL 28 (L) 08/06/2014 0746   CHOLHDL 3.7 04/16/2015 0820   VLDL 14 04/16/2015 0820   LDLCALC 61 04/16/2015 0820   LDLCALC 63 08/06/2014 0746     Wt Readings from Last 3 Encounters:  12/04/15 259 lb 12.8 oz (117.8 kg)  11/20/15 260 lb (117.9 kg)  11/11/15 261 lb 3.2 oz (118.5 kg)     Other studies Reviewed: Additional studies/ records that were reviewed today include: Office notes, hospital records and testing.  ASSESSMENT AND PLAN:  1. Chest pain: His chest pain is nitrate responsive. I discussed the situation with he and his wife. Advised that we could try the long-acting nitrates again since his chest pain was nitrate responsive, but we would need to cut back on some of his other medications in order to do so. He may be having some GI spasm or small vessel cardiac disease. Either one would be helped by the nitrates.  Therefore, we will decrease his amlodipine to one half tablet daily and start him on Imdur at 15 mg daily. He is to continue other medications as previous.   2. CAD: He is encouraged to continue his cardiac risk  factor reduction. He is encouraged to continue exercising, watching what he eats, and taking his medications.  3. Dyspnea on exertion: Shortness of breath seems to  precede the chest pain. I wonder if this is a late reaction to the [place]. He is interested in stopping the Brilinta because the cost of it is going to go up significantly since his discount card has run out.  **Discuss with Dr. Claiborne Billings changing the Brilinta to Plavix**  4. Rheumatoid arthritis: He is strongly encouraged to continue the Remicade. He can ask his rheumatologist if there are other treatment options available if we are not able to manage his symptoms with the medication changes we are making   Current medicines are reviewed at length with the patient today.  The patient has concerns regarding medicines. Concerns were addressed  The following changes have been made:  Decrease amlodipine, add Imdur  Labs/ tests ordered today include:  No orders of the defined types were placed in this encounter.    Disposition:   FU with Dr. Claiborne Billings  Signed, Donathan Buller, Suanne Marker, PA-C  12/04/2015 2:40 PM    Trainer Phone: [phone]; Fax: [phone]  This note was written with the assistance of speech recognition software. Please excuse any transcriptional errors.

## 2015-12-04 NOTE — Patient Instructions (Addendum)
Medication Instructions:  Your physician has recommended you make the following change in your medication:  1-Decrease Amilodipine 2.5 mg (1/2 tablet) by mouth daily 2 START Imdur 15 mg (1/2 tablet) by mouth daily   Labwork: NONE  Testing/Procedures: NONE  Follow-Up: Your physician wants you to follow-up in: 6 months with Dr. Claiborne Billings. You will receive a reminder letter in the mail two months in advance. If you don't receive a letter, please call our office to schedule the follow-up appointment.   If you need a refill on your cardiac medications before your next appointment, please call your pharmacy.

## 2016-05-03 ENCOUNTER — Encounter (HOSPITAL_COMMUNITY): Payer: Self-pay | Admitting: *Deleted

## 2016-06-07 ENCOUNTER — Telehealth: Payer: Self-pay

## 2016-06-07 ENCOUNTER — Other Ambulatory Visit: Payer: Self-pay

## 2016-06-07 DIAGNOSIS — K625 Hemorrhage of anus and rectum: Secondary | ICD-10-CM

## 2016-06-07 MED ORDER — PEG-KCL-NACL-NASULF-NA ASC-C 100 G PO SOLR: 1.0000 | ORAL | 0 refills | Status: DC

## 2016-06-07 NOTE — Telephone Encounter (Addendum)
Gastroenterology Pre-Procedure Review  Request Date: Requesting Physician:   PATIENT REVIEW QUESTIONS: The patient responded to the following health history questions as indicated:    1. Diabetes Melitis: NP 2. Joint replacements in the past 12 months: NO 3. Major health problems in the past 3 months: NO 4. Has an artificial valve or MVP: NO 5. Has a defibrillator: NO 6. Has been advised in past to take antibiotics in advance of a procedure like teeth cleaning: NO 7. Family history of colon cancer: NO 8. Alcohol Use: NO 9. History of sleep apnea: NO 10. History of coronary artery or other vascular stents placed within the last 12 months: NO    MEDICATIONS & ALLERGIES:    Patient reports the following regarding taking any blood thinners:   Plavix? NO Aspirin? NO Coumadin? NO Brilinta? YES Xarelto? NO Eliquis? NO Pradaxa? NO Savaysa? NO Effient? NO  Patient confirms/reports the following medications:  Current Outpatient Prescriptions  Medication Sig Dispense Refill  . amLODipine (NORVASC) 5 MG tablet Take 0.5 tablets (2.5 mg total) by mouth daily. 30 tablet 6  . aspirin EC 81 MG tablet Take 1 tablet (81 mg total) by mouth daily. 30 tablet 3  . atorvastatin (LIPITOR) 80 MG tablet TAKE ONE TABLET BY MOUTH ONCE DAILY. 30 tablet 8  . BRILINTA 90 MG TABS tablet TAKE (1) TABLET BY MOUTH TWICE DAILY. 60 tablet 10  . clonazePAM (KLONOPIN) 0.5 MG tablet Take 0.5 mg by mouth 2 (two) times daily as needed for anxiety.    . InFLIXimab (REMICADE IV) Inject into the vein as directed. Every 6 weeks    . loratadine (CLARITIN) 10 MG tablet Take 10 mg by mouth daily.     Marland Kitchen losartan (COZAAR) 25 MG tablet Take 25 mg  In the morning , and 12.5. mg in the evening 135 tablet 3  . metoprolol tartrate (LOPRESSOR) 25 MG tablet TAKE (1) TABLET BY MOUTH TWICE DAILY. 60 tablet 10  . nitroGLYCERIN (NITROSTAT) 0.4 MG SL tablet Take one tab sublingual when experiencing chest tightness and shortness of  breath. 25 tablet 4  . traMADol (ULTRAM) 50 MG tablet Take 50 mg by mouth every 6 (six) hours as needed for moderate pain.     . furosemide (LASIX) 20 MG tablet TAKE ONE TABLET BY MOUTH ONCE DAILY. (Patient not taking: Reported on 06/07/2016) 30 tablet 0  . isosorbide mononitrate (IMDUR) 30 MG 24 hr tablet Take 0.5 tablets (15 mg total) by mouth daily. 45 tablet 3  . ranitidine (ZANTAC) 150 MG capsule Take 150 mg by mouth 2 (two) times daily.     No current facility-administered medications for this visit.     Patient confirms/reports the following allergies:  Allergies  Allergen Reactions  . Lisinopril Cough  . Prednisone Other (See Comments)    Makes angry    No orders of the defined types were placed in this encounter.   AUTHORIZATION INFORMATION Primary Insurance: [employer],  ID #: ,  Group #:  Pre-Cert / Auth required: Pre-Cert / Auth #:    SCHEDULE INFORMATION: Procedure has been scheduled as follows:  Date: 06/10/16, Time:  12:15 PM Location:   This Gastroenterology Pre-Precedure Review Form is being routed to the following provider(s): R. Garfield Cornea, MD

## 2016-06-07 NOTE — Patient Instructions (Signed)
Richard Simpson  06/07/2016     @PREFPERIOPPHARMACY @   Your procedure is scheduled on  06/10/2016  Report to [facility] at  615  A.M.  Call this number if you have problems the morning of surgery:  [phone]   Remember:  Do not eat food or drink liquids after midnight.  Take these medicines the morning of surgery with A SIP OF WATER  Amlodipine, klonopin, imdur, claritin, cozaar, metoprolol, zantac.   Do not wear jewelry, make-up or nail polish.  Do not wear lotions, powders, or perfumes, or deoderant.  Do not shave 48 hours prior to surgery.  Men may shave face and neck.  Do not bring valuables to the hospital.  [facility] is not responsible for any belongings or valuables.  Contacts, dentures or bridgework may not be worn into surgery.  Leave your suitcase in the car.  After surgery it may be brought to your room.  For patients admitted to the hospital, discharge time will be determined by your treatment team.  Patients discharged the day of surgery will not be allowed to drive home.   Name and phone number of your driver:   family Special instructions:  Follow the diet and prep instructions given to you by Dr Roseanne Kaufman office.  Please read over the following fact sheets that you were given. Anesthesia Post-op Instructions and Care and Recovery After Surgery       Colonoscopy, Adult A colonoscopy is an exam to look at the entire large intestine. During the exam, a lubricated, bendable tube is inserted into the anus and then passed into the rectum, colon, and other parts of the large intestine. A colonoscopy is often done as a part of normal colorectal screening or in response to certain symptoms, such as anemia, persistent diarrhea, abdominal pain, and blood in the stool. The exam can help screen for and diagnose medical problems, including:  Tumors.  Polyps.  Inflammation.  Areas of bleeding. Tell a health care provider about:  Any allergies you  have.  All medicines you are taking, including vitamins, herbs, eye drops, creams, and over-the-counter medicines.  Any problems you or family members have had with anesthetic medicines.  Any blood disorders you have.  Any surgeries you have had.  Any medical conditions you have.  Any problems you have had passing stool. What are the risks? Generally, this is a safe procedure. However, problems may occur, including:  Bleeding.  A tear in the intestine.  A reaction to medicines given during the exam.  Infection (rare). What happens before the procedure? Eating and drinking restrictions  Follow instructions from your health care provider about eating and drinking, which may include:  A few days before the procedure - follow a low-fiber diet. Avoid nuts, seeds, dried fruit, raw fruits, and vegetables.  1-3 days before the procedure - follow a clear liquid diet. Drink only clear liquids, such as clear broth or bouillon, black coffee or tea, clear juice, clear soft drinks or sports drinks, gelatin dessert, and popsicles. Avoid any liquids that contain red or purple dye.  On the day of the procedure - do not eat or drink anything during the 2 hours before the procedure, or within the time period that your health care provider recommends. Bowel prep  If you were prescribed an oral bowel prep to clean out your colon:  Take it as told by your health care provider. Starting the day before  your procedure, you will need to drink a large amount of medicated liquid. The liquid will cause you to have multiple loose stools until your stool is almost clear or light green.  If your skin or anus gets irritated from diarrhea, you may use these to relieve the irritation:  Medicated wipes, such as adult wet wipes with aloe and vitamin E.  A skin soothing-product like petroleum jelly.  If you vomit while drinking the bowel prep, take a break for up to 60 minutes and then begin the bowel prep  again. If vomiting continues and you cannot take the bowel prep without vomiting, call your health care provider. General instructions   Ask your health care provider about changing or stopping your regular medicines. This is especially important if you are taking diabetes medicines or blood thinners.  Plan to have someone take you home from the hospital or clinic. What happens during the procedure?  An IV tube may be inserted into one of your veins.  You will be given medicine to help you relax (sedative).  To reduce your risk of infection:  Your health care team will wash or sanitize their hands.  Your anal area will be washed with soap.  You will be asked to lie on your side with your knees bent.  Your health care provider will lubricate a long, thin, flexible tube. The tube will have a camera and a light on the end.  The tube will be inserted into your anus.  The tube will be gently eased through your rectum and colon.  Air will be delivered into your colon to keep it open. You may feel some pressure or cramping.  The camera will be used to take images during the procedure.  A small tissue sample may be removed from your body to be examined under a microscope (biopsy). If any potential problems are found, the tissue will be sent to a lab for testing.  If small polyps are found, your health care provider may remove them and have them checked for cancer cells.  The tube that was inserted into your anus will be slowly removed. The procedure may vary among health care providers and hospitals. What happens after the procedure?  Your blood pressure, heart rate, breathing rate, and blood oxygen level will be monitored until the medicines you were given have worn off.  Do not drive for 24 hours after the exam.  You may have a small amount of blood in your stool.  You may pass gas and have mild abdominal cramping or bloating due to the air that was used to inflate your colon  during the exam.  It is up to you to get the results of your procedure. Ask your health care provider, or the department performing the procedure, when your results will be ready. This information is not intended to replace advice given to you by your health care provider. Make sure you discuss any questions you have with your health care provider. Document Released: 01/09/2000 Document Revised: 11/12/2015 Document Reviewed: 03/25/2015 Elsevier Interactive Patient Education  2017 [employer]  Colonoscopy, Adult, Care After This sheet gives you information about how to care for yourself after your procedure. Your health care provider may also give you more specific instructions. If you have problems or questions, contact your health care provider. What can I expect after the procedure? After the procedure, it is common to have:  A small amount of blood in your stool for 24 hours after the  procedure.  Some gas.  Mild abdominal cramping or bloating. Follow these instructions at home: General instructions    For the first 24 hours after the procedure:  Do not drive or use machinery.  Do not sign important documents.  Do not drink alcohol.  Do your regular daily activities at a slower pace than normal.  Eat soft, easy-to-digest foods.  Rest often.  Take over-the-counter or prescription medicines only as told by your health care provider.  It is up to you to get the results of your procedure. Ask your health care provider, or the department performing the procedure, when your results will be ready. Relieving cramping and bloating   Try walking around when you have cramps or feel bloated.  Apply heat to your abdomen as told by your health care provider. Use a heat source that your health care provider recommends, such as a moist heat pack or a heating pad.  Place a towel between your skin and the heat source.  Leave the heat on for 20-30 minutes.  Remove the heat if your  skin turns bright red. This is especially important if you are unable to feel pain, heat, or cold. You may have a greater risk of getting burned. Eating and drinking   Drink enough fluid to keep your urine clear or pale yellow.  Resume your normal diet as instructed by your health care provider. Avoid heavy or fried foods that are hard to digest.  Avoid drinking alcohol for as long as instructed by your health care provider. Contact a health care provider if:  You have blood in your stool 2-3 days after the procedure. Get help right away if:  You have more than a small spotting of blood in your stool.  You pass large blood clots in your stool.  Your abdomen is swollen.  You have nausea or vomiting.  You have a fever.  You have increasing abdominal pain that is not relieved with medicine. This information is not intended to replace advice given to you by your health care provider. Make sure you discuss any questions you have with your health care provider. Document Released: 08/26/2003 Document Revised: 10/06/2015 Document Reviewed: 03/25/2015 Elsevier Interactive Patient Education  2017 [place] Anesthesia is a term that refers to techniques, procedures, and medicines that help a person stay safe and comfortable during a medical procedure. Monitored anesthesia care, or sedation, is one type of anesthesia. Your anesthesia specialist may recommend sedation if you will be having a procedure that does not require you to be unconscious, such as:  Cataract surgery.  A dental procedure.  A biopsy.  A colonoscopy. During the procedure, you may receive a medicine to help you relax (sedative). There are three levels of sedation:  Mild sedation. At this level, you may feel awake and relaxed. You will be able to follow directions.  Moderate sedation. At this level, you will be sleepy. You may not remember the procedure.  Deep sedation. At this level,  you will be asleep. You will not remember the procedure. The more medicine you are given, the deeper your level of sedation will be. Depending on how you respond to the procedure, the anesthesia specialist may change your level of sedation or the type of anesthesia to fit your needs. An anesthesia specialist will monitor you closely during the procedure. Let your health care provider know about:  Any allergies you have.  All medicines you are taking, including vitamins, herbs, eye drops,  creams, and over-the-counter medicines.  Any use of steroids (by mouth or as a cream).  Any problems you or family members have had with sedatives and anesthetic medicines.  Any blood disorders you have.  Any surgeries you have had.  Any medical conditions you have, such as sleep apnea.  Whether you are pregnant or may be pregnant.  Any use of cigarettes, alcohol, or street drugs. What are the risks? Generally, this is a safe procedure. However, problems may occur, including:  Getting too much medicine (oversedation).  Nausea.  Allergic reaction to medicines.  Trouble breathing. If this happens, a breathing tube may be used to help with breathing. It will be removed when you are awake and breathing on your own.  Heart trouble.  Lung trouble. Before the procedure Staying hydrated  Follow instructions from your health care provider about hydration, which may include:  Up to 2 hours before the procedure - you may continue to drink clear liquids, such as water, clear fruit juice, black coffee, and plain tea. Eating and drinking restrictions  Follow instructions from your health care provider about eating and drinking, which may include:  8 hours before the procedure - stop eating heavy meals or foods such as meat, fried foods, or fatty foods.  6 hours before the procedure - stop eating light meals or foods, such as toast or cereal.  6 hours before the procedure - stop drinking milk or  drinks that contain milk.  2 hours before the procedure - stop drinking clear liquids. Medicines  Ask your health care provider about:  Changing or stopping your regular medicines. This is especially important if you are taking diabetes medicines or blood thinners.  Taking medicines such as aspirin and ibuprofen. These medicines can thin your blood. Do not take these medicines before your procedure if your health care provider instructs you not to. Tests and exams  You will have a physical exam.  You may have blood tests done to show:  How well your kidneys and liver are working.  How well your blood can clot.  General instructions  Plan to have someone take you home from the hospital or clinic.  If you will be going home right after the procedure, plan to have someone with you for 24 hours. What happens during the procedure?  Your blood pressure, heart rate, breathing, level of pain and overall condition will be monitored.  An IV tube will be inserted into one of your veins.  Your anesthesia specialist will give you medicines as needed to keep you comfortable during the procedure. This may mean changing the level of sedation.  The procedure will be performed. After the procedure  Your blood pressure, heart rate, breathing rate, and blood oxygen level will be monitored until the medicines you were given have worn off.  Do not drive for 24 hours if you received a sedative.  You may:  Feel sleepy, clumsy, or nauseous.  Feel forgetful about what happened after the procedure.  Have a sore throat if you had a breathing tube during the procedure.  Vomit. This information is not intended to replace advice given to you by your health care provider. Make sure you discuss any questions you have with your health care provider. Document Released: 10/07/2004 Document Revised: 06/20/2015 Document Reviewed: 05/04/2015 Elsevier Interactive Patient Education  2017 [place], Care After These instructions provide you with information about caring for yourself after your procedure. Your health care provider may also  give you more specific instructions. Your treatment has been planned according to current medical practices, but problems sometimes occur. Call your health care provider if you have any problems or questions after your procedure. What can I expect after the procedure? After your procedure, it is common to:  Feel sleepy for several hours.  Feel clumsy and have poor balance for several hours.  Feel forgetful about what happened after the procedure.  Have poor judgment for several hours.  Feel nauseous or vomit.  Have a sore throat if you had a breathing tube during the procedure. Follow these instructions at home: For at least 24 hours after the procedure:    Do not:  Participate in activities in which you could fall or become injured.  Drive.  Use heavy machinery.  Drink alcohol.  Take sleeping pills or medicines that cause drowsiness.  Make important decisions or sign legal documents.  Take care of children on your own.  Rest. Eating and drinking   Follow the diet that is recommended by your health care provider.  If you vomit, drink water, juice, or soup when you can drink without vomiting.  Make sure you have little or no nausea before eating solid foods. General instructions   Have a responsible adult stay with you until you are awake and alert.  Take over-the-counter and prescription medicines only as told by your health care provider.  If you smoke, do not smoke without supervision.  Keep all follow-up visits as told by your health care provider. This is important. Contact a health care provider if:  You keep feeling nauseous or you keep vomiting.  You feel light-headed.  You develop a rash.  You have a fever. Get help right away if:  You have trouble breathing. This  information is not intended to replace advice given to you by your health care provider. Make sure you discuss any questions you have with your health care provider. Document Released: 05/04/2015 Document Revised: 09/03/2015 Document Reviewed: 05/04/2015 Elsevier Interactive Patient Education  2017 [employer].

## 2016-06-07 NOTE — Telephone Encounter (Addendum)
YES sorry I will fix that

## 2016-06-07 NOTE — Telephone Encounter (Signed)
Pt will bring new insurance card by this afternoon

## 2016-06-07 NOTE — Telephone Encounter (Signed)
Taking Brilinta - triage say yes and no; which is it?

## 2016-06-07 NOTE — Telephone Encounter (Signed)
Pt is set up for 06/10/16 @ 7:30 am. He is aware and will come by later today for instructions

## 2016-06-08 ENCOUNTER — Encounter (HOSPITAL_COMMUNITY)
Admission: RE | Admit: 2016-06-08 | Discharge: 2016-06-08 | Disposition: A | Discharge status: Home / Self Care | Payer: PRIVATE HEALTH INSURANCE | Source: Ambulatory Visit | Attending: Internal Medicine | Admitting: Internal Medicine

## 2016-06-09 ENCOUNTER — Encounter (HOSPITAL_COMMUNITY)
Admission: RE | Admit: 2016-06-09 | Discharge: 2016-06-09 | Disposition: A | Discharge status: Home / Self Care | Payer: PRIVATE HEALTH INSURANCE | Source: Ambulatory Visit | Attending: Internal Medicine | Admitting: Internal Medicine

## 2016-06-09 ENCOUNTER — Other Ambulatory Visit: Payer: Self-pay

## 2016-06-09 ENCOUNTER — Encounter (HOSPITAL_COMMUNITY): Payer: Self-pay

## 2016-06-09 DIAGNOSIS — K641 Second degree hemorrhoids: Secondary | ICD-10-CM | POA: Diagnosis not present

## 2016-06-09 DIAGNOSIS — Z87891 Personal history of nicotine dependence: Secondary | ICD-10-CM | POA: Diagnosis not present

## 2016-06-09 DIAGNOSIS — K921 Melena: Secondary | ICD-10-CM | POA: Diagnosis present

## 2016-06-09 DIAGNOSIS — I509 Heart failure, unspecified: Secondary | ICD-10-CM | POA: Diagnosis not present

## 2016-06-09 DIAGNOSIS — K219 Gastro-esophageal reflux disease without esophagitis: Secondary | ICD-10-CM | POA: Diagnosis not present

## 2016-06-09 DIAGNOSIS — Z86010 Personal history of colonic polyps: Secondary | ICD-10-CM | POA: Diagnosis not present

## 2016-06-09 DIAGNOSIS — I2510 Atherosclerotic heart disease of native coronary artery without angina pectoris: Secondary | ICD-10-CM | POA: Diagnosis not present

## 2016-06-09 DIAGNOSIS — M069 Rheumatoid arthritis, unspecified: Secondary | ICD-10-CM | POA: Diagnosis not present

## 2016-06-09 DIAGNOSIS — E785 Hyperlipidemia, unspecified: Secondary | ICD-10-CM | POA: Diagnosis not present

## 2016-06-09 DIAGNOSIS — Z955 Presence of coronary angioplasty implant and graft: Secondary | ICD-10-CM | POA: Diagnosis not present

## 2016-06-09 DIAGNOSIS — Z7902 Long term (current) use of antithrombotics/antiplatelets: Secondary | ICD-10-CM | POA: Diagnosis not present

## 2016-06-09 DIAGNOSIS — I252 Old myocardial infarction: Secondary | ICD-10-CM | POA: Diagnosis not present

## 2016-06-09 DIAGNOSIS — D122 Benign neoplasm of ascending colon: Secondary | ICD-10-CM | POA: Diagnosis not present

## 2016-06-09 DIAGNOSIS — I110 Hypertensive heart disease with heart failure: Secondary | ICD-10-CM | POA: Diagnosis not present

## 2016-06-09 DIAGNOSIS — F419 Anxiety disorder, unspecified: Secondary | ICD-10-CM | POA: Diagnosis not present

## 2016-06-09 DIAGNOSIS — Z7982 Long term (current) use of aspirin: Secondary | ICD-10-CM | POA: Diagnosis not present

## 2016-06-09 DIAGNOSIS — K5730 Diverticulosis of large intestine without perforation or abscess without bleeding: Secondary | ICD-10-CM | POA: Diagnosis not present

## 2016-06-09 HISTORY — DX: Depression, unspecified: F32.A

## 2016-06-09 HISTORY — DX: Personal history of urinary calculi: Z87.442

## 2016-06-09 HISTORY — DX: Major depressive disorder, single episode, unspecified: F32.9

## 2016-06-09 HISTORY — DX: Anxiety disorder, unspecified: F41.9

## 2016-06-09 HISTORY — DX: Gastro-esophageal reflux disease without esophagitis: K21.9

## 2016-06-09 LAB — CBC WITH DIFFERENTIAL/PLATELET
BASOS ABS: 0 10*3/uL (ref 0.0–0.1)
Basophils Relative: 0 %
EOS PCT: 0 %
Eosinophils Absolute: 0 10*3/uL (ref 0.0–0.7)
HCT: 45.1 % (ref 39.0–52.0)
Hemoglobin: 16 g/dL (ref 13.0–17.0)
LYMPHS PCT: 34 %
Lymphs Abs: 1.5 10*3/uL (ref 0.7–4.0)
MCH: 32.4 pg (ref 26.0–34.0)
MCHC: 35.5 g/dL (ref 30.0–36.0)
MCV: 91.3 fL (ref 78.0–100.0)
MONO ABS: 0.6 10*3/uL (ref 0.1–1.0)
MONOS PCT: 13 %
Neutro Abs: 2.3 10*3/uL (ref 1.7–7.7)
Neutrophils Relative %: 53 %
PLATELETS: 179 10*3/uL (ref 150–400)
RBC: 4.94 MIL/uL (ref 4.22–5.81)
RDW: 12.7 % (ref 11.5–15.5)
WBC: 4.4 10*3/uL (ref 4.0–10.5)

## 2016-06-09 LAB — BASIC METABOLIC PANEL
ANION GAP: 8 (ref 5–15)
BUN: 19 mg/dL (ref 6–20)
CALCIUM: 9 mg/dL (ref 8.9–10.3)
CO2: 21 mmol/L — ABNORMAL LOW (ref 22–32)
Chloride: 106 mmol/L (ref 101–111)
Creatinine, Ser: 0.89 mg/dL (ref 0.61–1.24)
GFR calc Af Amer: >60 mL/min (ref >60)
Glucose, Bld: 91 mg/dL (ref 65–99)
POTASSIUM: 3.7 mmol/L (ref 3.5–5.1)
SODIUM: 135 mmol/L (ref 135–145)

## 2016-06-10 ENCOUNTER — Encounter (HOSPITAL_COMMUNITY): Payer: Self-pay | Admitting: *Deleted

## 2016-06-10 ENCOUNTER — Ambulatory Visit (HOSPITAL_COMMUNITY): Payer: PRIVATE HEALTH INSURANCE | Admitting: Anesthesiology

## 2016-06-10 ENCOUNTER — Encounter (HOSPITAL_COMMUNITY): Payer: Self-pay

## 2016-06-10 ENCOUNTER — Ambulatory Visit (HOSPITAL_COMMUNITY): Admit: 2016-06-10 | Payer: PRIVATE HEALTH INSURANCE | Admitting: Internal Medicine

## 2016-06-10 ENCOUNTER — Ambulatory Visit (HOSPITAL_COMMUNITY)
Admission: RE | Admit: 2016-06-10 | Discharge: 2016-06-10 | Disposition: A | Discharge status: Home / Self Care | Payer: PRIVATE HEALTH INSURANCE | Source: Ambulatory Visit | Attending: Internal Medicine | Admitting: Internal Medicine

## 2016-06-10 ENCOUNTER — Encounter (HOSPITAL_COMMUNITY)
Admission: RE | Disposition: A | Discharge status: Home / Self Care | Payer: Self-pay | Source: Ambulatory Visit | Attending: Internal Medicine

## 2016-06-10 DIAGNOSIS — Z87891 Personal history of nicotine dependence: Secondary | ICD-10-CM | POA: Insufficient documentation

## 2016-06-10 DIAGNOSIS — D122 Benign neoplasm of ascending colon: Secondary | ICD-10-CM | POA: Insufficient documentation

## 2016-06-10 DIAGNOSIS — M069 Rheumatoid arthritis, unspecified: Secondary | ICD-10-CM | POA: Insufficient documentation

## 2016-06-10 DIAGNOSIS — I110 Hypertensive heart disease with heart failure: Secondary | ICD-10-CM | POA: Insufficient documentation

## 2016-06-10 DIAGNOSIS — K625 Hemorrhage of anus and rectum: Secondary | ICD-10-CM

## 2016-06-10 DIAGNOSIS — K641 Second degree hemorrhoids: Secondary | ICD-10-CM | POA: Insufficient documentation

## 2016-06-10 DIAGNOSIS — K921 Melena: Secondary | ICD-10-CM | POA: Diagnosis not present

## 2016-06-10 DIAGNOSIS — E785 Hyperlipidemia, unspecified: Secondary | ICD-10-CM | POA: Insufficient documentation

## 2016-06-10 DIAGNOSIS — K5730 Diverticulosis of large intestine without perforation or abscess without bleeding: Secondary | ICD-10-CM | POA: Insufficient documentation

## 2016-06-10 DIAGNOSIS — Z7902 Long term (current) use of antithrombotics/antiplatelets: Secondary | ICD-10-CM | POA: Insufficient documentation

## 2016-06-10 DIAGNOSIS — Z7982 Long term (current) use of aspirin: Secondary | ICD-10-CM | POA: Insufficient documentation

## 2016-06-10 DIAGNOSIS — I2510 Atherosclerotic heart disease of native coronary artery without angina pectoris: Secondary | ICD-10-CM | POA: Insufficient documentation

## 2016-06-10 DIAGNOSIS — Z86010 Personal history of colonic polyps: Secondary | ICD-10-CM | POA: Insufficient documentation

## 2016-06-10 DIAGNOSIS — F419 Anxiety disorder, unspecified: Secondary | ICD-10-CM | POA: Insufficient documentation

## 2016-06-10 DIAGNOSIS — I252 Old myocardial infarction: Secondary | ICD-10-CM | POA: Insufficient documentation

## 2016-06-10 DIAGNOSIS — Z955 Presence of coronary angioplasty implant and graft: Secondary | ICD-10-CM | POA: Insufficient documentation

## 2016-06-10 DIAGNOSIS — K219 Gastro-esophageal reflux disease without esophagitis: Secondary | ICD-10-CM | POA: Insufficient documentation

## 2016-06-10 DIAGNOSIS — I509 Heart failure, unspecified: Secondary | ICD-10-CM | POA: Insufficient documentation

## 2016-06-10 HISTORY — PX: POLYPECTOMY: SHX5525

## 2016-06-10 HISTORY — PX: COLONOSCOPY WITH PROPOFOL: SHX5780

## 2016-06-10 SURGERY — COLONOSCOPY
Anesthesia: Moderate Sedation

## 2016-06-10 SURGERY — COLONOSCOPY WITH PROPOFOL
Anesthesia: Monitor Anesthesia Care

## 2016-06-10 MED ORDER — CHLORHEXIDINE GLUCONATE CLOTH 2 % EX PADS: 6.0000 | MEDICATED_PAD | Freq: Once | CUTANEOUS | Status: DC

## 2016-06-10 MED ORDER — PROPOFOL 500 MG/50ML IV EMUL
INTRAVENOUS | Status: DC | PRN
  Administered 2016-06-10: 08:00:00 via INTRAVENOUS
  Administered 2016-06-10: 75 ug/kg/min via INTRAVENOUS

## 2016-06-10 MED ORDER — FENTANYL CITRATE (PF) 100 MCG/2ML IJ SOLN
INTRAMUSCULAR | Status: AC
  Filled 2016-06-10: qty 2

## 2016-06-10 MED ORDER — MIDAZOLAM HCL 2 MG/2ML IJ SOLN
1.0000 mg | INTRAMUSCULAR | Status: DC | PRN
  Administered 2016-06-10: 2 mg via INTRAVENOUS

## 2016-06-10 MED ORDER — FENTANYL CITRATE (PF) 100 MCG/2ML IJ SOLN
25.0000 ug | INTRAMUSCULAR | Status: DC | PRN
  Administered 2016-06-10: 50 ug via INTRAVENOUS

## 2016-06-10 MED ORDER — PROPOFOL 10 MG/ML IV BOLUS
INTRAVENOUS | Status: AC
  Filled 2016-06-10: qty 20

## 2016-06-10 MED ORDER — PROPOFOL 10 MG/ML IV BOLUS
INTRAVENOUS | Status: AC
Start: 2016-06-10 — End: ?
  Filled 2016-06-10: qty 40

## 2016-06-10 MED ORDER — MIDAZOLAM HCL 2 MG/2ML IJ SOLN
INTRAMUSCULAR | Status: AC
  Filled 2016-06-10: qty 2

## 2016-06-10 MED ORDER — LACTATED RINGERS IV SOLN
INTRAVENOUS | Status: DC
  Administered 2016-06-10 (×3): via INTRAVENOUS

## 2016-06-10 NOTE — Anesthesia Preprocedure Evaluation (Signed)
Anesthesia Evaluation  Patient identified by MRN, date of birth, ID band Patient awake    Reviewed: Allergy & Precautions, NPO status , Patient's Chart, lab work & pertinent test results, reviewed documented beta blocker date and time   Airway Mallampati: III  TM Distance: >3 FB Neck ROM: Full    Dental  (+) Teeth Intact   Pulmonary shortness of breath and with exertion, former smoker,    breath sounds clear to auscultation       Cardiovascular hypertension, Pt. on medications and Pt. on home beta blockers + angina + CAD, + Past MI, + Cardiac Stents and +CHF ( Ischemic cardiomyopathy - Resolved 06/2014 a. 06/2014 EF 45-50% by LV gram;; b. Echo July 2016: EF 60-65%. No RWMA. Gr 1 DD )   Rhythm:Regular Rate:Normal     Neuro/Psych  Headaches, PSYCHIATRIC DISORDERS Anxiety Depression    GI/Hepatic GERD  Medicated and Controlled,  Endo/Other    Renal/GU      Musculoskeletal  (+) Arthritis , Rheumatoid disorders,    Abdominal   Peds  Hematology   Anesthesia Other Findings   Reproductive/Obstetrics                             Anesthesia Physical Anesthesia Plan  ASA: III  Anesthesia Plan: MAC   Post-op Pain Management:    Induction: Intravenous  Airway Management Planned: Simple Face Mask  Additional Equipment:   Intra-op Plan:   Post-operative Plan:   Informed Consent: I have reviewed the patients History and Physical, chart, labs and discussed the procedure including the risks, benefits and alternatives for the proposed anesthesia with the patient or authorized representative who has indicated his/her understanding and acceptance.     Plan Discussed with:   Anesthesia Plan Comments:         Anesthesia Quick Evaluation

## 2016-06-10 NOTE — Anesthesia Postprocedure Evaluation (Signed)
Anesthesia Post Note  Patient: Richard Simpson  Procedure(s) Performed: Procedure(s) (LRB): COLONOSCOPY WITH PROPOFOL (N/A) POLYPECTOMY  Patient location during evaluation: PACU Anesthesia Type: MAC Level of consciousness: awake and alert and oriented Pain management: pain level controlled Vital Signs Assessment: post-procedure vital signs reviewed and stable Respiratory status: spontaneous breathing and respiratory function stable Cardiovascular status: stable Postop Assessment: no signs of nausea or vomiting Anesthetic complications: no     Last Vitals:  Vitals:   06/10/16 0645 06/10/16 0700  BP: 110/77 108/75  Resp: 16 (!) 24  Temp:      Last Pain:  Vitals:   06/10/16 0625  TempSrc: Oral                 ADAMS, AMY A

## 2016-06-10 NOTE — H&P (Signed)
'@LOGO' @   Primary Care Physician:  Caryl Bis, MD Primary Gastroenterologist:  Dr. Gala Romney  Pre-Procedure History & Physical: HPI:  Richard Simpson is a 46 y.o. male here for further evaluation of hematochezia. Now on Alimta. 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 Hasn't had any abdominal pain or constipation. 2 bowel movements daily. Denies straining.  Past Medical History:  Diagnosis Date  . Anxiety   . CAD S/P percutaneous coronary angioplasty 10/2013   a. 10/2013 Cath: mod LAD dzs->nl FFR-> medically managed;  b. 06/2014 NSTEMI/PCi: LAD 99ost, 70p, 35m(entire area covered with a 3.5x20 Synergy DES), LCX nl, RCA 273m10d, EF 45-50%.   . 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 Depression   . Dyslipidemia, goal LDL below 70   . Essential hypertension   . GERD (gastroesophageal reflux disease)   . Headache   . History of kidney stones   . Ischemic cardiomyopathy - Resolved 06/2014   a. 06/2014 EF 45-50% by LV gram;; b. Echo July 2016: EF 60-65%. No RWMA. Gr 1 DD   . NSTEMI (non-ST elevated myocardial infarction) ([facility]6/2016  . Obesity (BMI 30-39.9) 10/29/2013  . Primary snoring 10/29/2013  . Rheumatoid arthritis ([facility]8/30/2015  . RLS (restless legs syndrome) 10/29/2013    Past Surgical History:  Procedure Laterality Date  . CARDIAC CATHETERIZATION N/A 07/05/2014   Procedure: Left Heart Cath and Coronary Angiography;  Surgeon: ThTroy SineMD;  [facility];  Service: Cardiovascular;  NSTEMI --> Ostial LAd 99%, prox 80% & early mid 50% --> PCI  . CARDIAC CATHETERIZATION  07/05/2014   Procedure: Coronary Stent Intervention;  Surgeon: ThTroy SineMD;  [facility];  Service: Cardiovascular; Ostial-prox LAD tandem 99, 80 & 50% --> Synergy DES 3.5 x 20 (3.74)  . CARDIAC CATHETERIZATION N/A 03/24/2015   Procedure: Left Heart Cath and Coronary Angiography;   Surgeon: DaLeonie ManMD;  [facility];  Service: Cardiovascular;  Laterality: N/A;  . CARDIAC CATHETERIZATION N/A 11/20/2015   Procedure: Left Heart Cath and Coronary Angiography;  Surgeon: ThTroy SineMD;  [facility];  Service: Cardiovascular;  Laterality: N/A;  . CHOLECYSTECTOMY     10 yrs ago-jenkins  . COLONOSCOPY N/A 03/23/2013   Procedure: COLONOSCOPY;  Surgeon: RoDaneil DolinMD;  [facility];  Service: Endoscopy;  Laterality: N/A;  2:15-moved to 1030 Staff notified pt  . FRACTIONAL FLOW RESERVE WIRE  11/23/2013   Procedure: FRACTIONAL FLOW RESERVE WIRE;  Surgeon: JaJettie BoozeMD;  [facility];  Service: Cardiovascular;;  . GALLBLADDER SURGERY    . LEFT HEART CATHETERIZATION WITH CORONARY ANGIOGRAM N/A 11/23/2013   Procedure: LEFT HEART CATHETERIZATION WITH CORONARY ANGIOGRAM;  Surgeon: JaJettie BoozeMD;  [facility];  Service: Cardiovascular;  Laterality: N/A;  . MASS EXCISION  09/09/2010   Procedure: EXCISION MASS;  Surgeon: StArther AbbottMD;  Location: [facility];  Service: Orthopedics;  Laterality: Left;  Excision Mass Left Long Finger  . THROAT SURGERY     nodules removed from throat as child  . TRANSTHORACIC ECHOCARDIOGRAM   July 2016:    EF 60-65%. No RWMA. Gr 1 DD    Prior to Admission medications   Medication Sig Start Date End Date Taking? Authorizing Provider  amLODipine (NORVASC) 5 MG tablet Take 0.5  tablets (2.5 mg total) by mouth daily. Patient taking differently: Take 5 mg by mouth daily.  12/04/15  Yes Barrett, Evelene Croon, PA-C  aspirin EC 81 MG tablet Take 1 tablet (81 mg total) by mouth daily. 12/04/13  Yes Burtis Junes, NP  atorvastatin (LIPITOR) 80 MG tablet TAKE ONE TABLET BY MOUTH ONCE DAILY. 10/21/15  Yes Troy Sine, MD  BRILINTA 90 MG TABS tablet TAKE (1) TABLET BY MOUTH TWICE DAILY. 10/21/15  Yes Troy Sine, MD  clonazePAM (KLONOPIN) 0.5 MG tablet Take 0.5 mg  by mouth 2 (two) times daily as needed for anxiety (takes 1 time daily and 2 nd dose only if needed).    Yes [provider]  furosemide (LASIX) 20 MG tablet TAKE ONE TABLET BY MOUTH ONCE DAILY. 11/20/15  Yes Leonie Man, MD  InFLIXimab (REMICADE IV) Inject into the vein as directed. Every 6 weeks   Yes [provider]  isosorbide mononitrate (IMDUR) 30 MG 24 hr tablet Take 0.5 tablets (15 mg total) by mouth daily. 12/04/15 06/09/16 Yes Barrett, Evelene Croon, PA-C  loratadine (CLARITIN) 10 MG tablet Take 10 mg by mouth daily.    Yes [provider]  losartan (COZAAR) 25 MG tablet Take 25 mg  In the morning , and 12.5. mg in the evening 11/20/15  Yes Troy Sine, MD  metoprolol tartrate (LOPRESSOR) 25 MG tablet TAKE (1) TABLET BY MOUTH TWICE DAILY. 10/21/15  Yes Troy Sine, MD  peg 3350 powder (MOVIPREP) 100 g SOLR Take 1 kit (200 g total) by mouth as directed. 06/07/16  Yes [name], [name], MD  ranitidine (ZANTAC) 150 MG capsule Take 150 mg by mouth every evening.    Yes [provider]  traMADol (ULTRAM) 50 MG tablet Take 50 mg by mouth every 6 (six) hours as needed for moderate pain.    Yes [provider]  nitroGLYCERIN (NITROSTAT) 0.4 MG SL tablet Take one tab sublingual when experiencing chest tightness and shortness of breath. 10/03/15   Barrett, Evelene Croon, PA-C    Allergies as of 06/07/2016 - Review Complete 06/07/2016  Allergen Reaction Noted  . Lisinopril Cough 07/12/2014  . Prednisone Other (See Comments) 10/29/2013    Family History  Problem Relation Age of Onset  . Hypertension Mother   . Diabetes Mother   . Arthritis Paternal Uncle   . Cancer Maternal Grandfather   . Hypertension Maternal Grandmother   . Diabetes Maternal Grandmother   . Diabetes Unknown   . Diabetes Unknown   . Diabetes Paternal Grandfather   . Anesthesia problems Neg Hx   . Hypotension Neg Hx   . Malignant hyperthermia Neg Hx   . Pseudochol deficiency Neg  Hx   . Heart attack Neg Hx   . Stroke Neg Hx     Social History   Social History  . Marital status: Married    Spouse name: Richard Simpson  . Number of children: 2  . Years of education: 12th grade   Occupational History  . Retired Printmaker Dept   Social History Main Topics  . Smoking status: Former Smoker    Packs/day: 0.25    Years: 15.00    Types: Cigarettes    Quit date: 09/04/2003  . Smokeless tobacco: Current User    Types: Chew  . Alcohol use 0.0 oz/week    1 - 2 Cans of beer per week     Comment: rare  . Drug use: No  .  Sexual activity: Yes   Other Topics Concern  . Not on file   Social History Narrative   Patient lives at home with his wife Richard Simpson).    Patient works full time Dentist..   Education high school.   Right handed.   Caffeine one cup of coffee daily and one soda.    Review of Systems: See HPI, otherwise negative ROS  Physical Exam: BP 110/77   Temp 97.5 F (36.4 C) (Oral)   Resp 16   SpO2 96%  General:   Alert,  , pleasant and cooperative in NAD Neck:  Supple; no masses or thyromegaly. No significant cervical adenopathy. Lungs:  Clear throughout to auscultation.   No wheezes, crackles, or rhonchi. No acute distress. Heart:  Regular rate and rhythm; no murmurs, clicks, rubs,  or gallops. Abdomen: Non-distended, normal bowel sounds.  Soft and nontender without appreciable mass or hepatosplenomegaly.  Pulses:  Normal pulses noted. Extremities:  Without clubbing or edema.  Impression:  Pleasant 46 year old gentleman with multiple medical issues now with low volume painless hematochezia. Known hemorrhoids. History of colonic polyps. On Brilinta.  Recommendations:  I have offered the patient a diagnostic colonoscopy today.  The risks, benefits, limitations, alternatives and imponderables have been reviewed with the patient. Questions have been answered. All parties are agreeable.             Notice: This  dictation was prepared with Dragon dictation along with smaller phrase technology. Any transcriptional errors that result from this process are unintentional and may not be corrected upon review.

## 2016-06-10 NOTE — Anesthesia Procedure Notes (Signed)
Procedure Name: MAC Date/Time: 06/10/2016 7:33 AM Performed by: Andree Elk, AMY A Pre-anesthesia Checklist: Patient identified, Emergency Drugs available, Suction available, Patient being monitored and Timeout performed Oxygen Delivery Method: Simple face mask

## 2016-06-10 NOTE — Transfer of Care (Signed)
Immediate Anesthesia Transfer of Care Note  Patient: Richard Simpson  Procedure(s) Performed: Procedure(s) with comments: COLONOSCOPY WITH PROPOFOL (N/A) - 730  POLYPECTOMY - colon  Patient Location: PACU  Anesthesia Type:MAC  Level of Consciousness: awake, alert , oriented and patient cooperative  Airway & Oxygen Therapy: Patient Spontanous Breathing  Post-op Assessment: Report given to RN and Post -op Vital signs reviewed and stable  Post vital signs: Reviewed and stable  Last Vitals:  Vitals:   06/10/16 0645 06/10/16 0700  BP: 110/77 108/75  Resp: 16 (!) 24  Temp:      Last Pain:  Vitals:   06/10/16 0625  TempSrc: Oral      Patients Stated Pain Goal: 7 (29/24/46 2863)  Complications: No apparent anesthesia complications

## 2016-06-10 NOTE — Discharge Instructions (Addendum)
Colonoscopy Discharge Instructions  Read the instructions outlined below and refer to this sheet in the next few weeks. These discharge instructions provide you with general information on caring for yourself after you leave the hospital. Your doctor may also give you specific instructions. While your treatment has been planned according to the most current medical practices available, unavoidable complications occasionally occur. If you have any problems or questions after discharge, call Dr. Gala Romney at [phone]. ACTIVITY  You may resume your regular activity, but move at a slower pace for the next 24 hours.   Take frequent rest periods for the next 24 hours.   Walking will help get rid of the air and reduce the bloated feeling in your belly (abdomen).   No driving for 24 hours (because of the medicine (anesthesia) used during the test).    Do not sign any important legal documents or operate any machinery for 24 hours (because of the anesthesia used during the test).  NUTRITION  Drink plenty of fluids.   You may resume your normal diet as instructed by your doctor.   Begin with a light meal and progress to your normal diet. Heavy or fried foods are harder to digest and may make you feel sick to your stomach (nauseated).   Avoid alcoholic beverages for 24 hours or as instructed.  MEDICATIONS  You may resume your normal medications unless your doctor tells you otherwise.  WHAT YOU CAN EXPECT TODAY  Some feelings of bloating in the abdomen.   Passage of more gas than usual.   Spotting of blood in your stool or on the toilet paper.  IF YOU HAD POLYPS REMOVED DURING THE COLONOSCOPY:  No aspirin products for 7 days or as instructed.   No alcohol for 7 days or as instructed.   Eat a soft diet for the next 24 hours.  FINDING OUT THE RESULTS OF YOUR TEST Not all test results are available during your visit. If your test results are not back during the visit, make an appointment  with your caregiver to find out the results. Do not assume everything is normal if you have not heard from your caregiver or the medical facility. It is important for you to follow up on all of your test results.  SEEK IMMEDIATE MEDICAL ATTENTION IF:  You have more than a spotting of blood in your stool.   Your belly is swollen (abdominal distention).   You are nauseated or vomiting.   You have a temperature over 101.   You have abdominal pain or discomfort that is severe or gets worse throughout the day.    Colon polyp, diverticulosis and hemorrhoid information provided  Avoid straining.  Begin Benefiber 1 tablespoon twice daily  Limit toilet time to 5 minutes  Anusol cream applied to the anorectum 3 times daily  Office visit with me in 2 months  Further recommendations to follow pending review of pathology report   Colon Polyps Polyps are tissue growths inside the body. Polyps can grow in many places, including the large intestine (colon). A polyp may be a round bump or a mushroom-shaped growth. You could have one polyp or several. Most colon polyps are noncancerous (benign). However, some colon polyps can become cancerous over time. What are the causes? The exact cause of colon polyps is not known. What increases the risk? This condition is more likely to develop in people who:  Have a family history of colon cancer or colon polyps.  Are older than 50  or older than 45 if they are African American.  Have inflammatory bowel disease, such as ulcerative colitis or Crohn disease.  Are overweight.  Smoke cigarettes.  Do not get enough exercise.  Drink too much alcohol.  Eat a diet that is:  High in fat and red meat.  Low in fiber.  Had childhood cancer that was treated with abdominal radiation. What are the signs or symptoms? Most polyps do not cause symptoms. If you have symptoms, they may include:  Blood coming from your rectum when having a bowel  movement.  Blood in your stool.The stool may look dark red or black.  A change in bowel habits, such as constipation or diarrhea. How is this diagnosed? This condition is diagnosed with a colonoscopy. This is a procedure that uses a lighted, flexible scope to look at the inside of your colon. How is this treated? Treatment for this condition involves removing any polyps that are found. Those polyps will then be tested for cancer. If cancer is found, your health care provider will talk to you about options for colon cancer treatment. Follow these instructions at home: Diet   Eat plenty of fiber, such as fruits, vegetables, and whole grains.  Eat foods that are high in calcium and vitamin D, such as milk, cheese, yogurt, eggs, liver, fish, and broccoli.  Limit foods high in fat, red meats, and processed meats, such as hot dogs, sausage, bacon, and lunch meats.  Maintain a healthy weight, or lose weight if recommended by your health care provider. General instructions   Do not smoke cigarettes.  Do not drink alcohol excessively.  Keep all follow-up visits as told by your health care provider. This is important. This includes keeping regularly scheduled colonoscopies. Talk to your health care provider about when you need a colonoscopy.  Exercise every day or as told by your health care provider. Contact a health care provider if:  You have new or worsening bleeding during a bowel movement.  You have new or increased blood in your stool.  You have a change in bowel habits.  You unexpectedly lose weight. This information is not intended to replace advice given to you by your health care provider. Make sure you discuss any questions you have with your health care provider. Document Released: 10/08/2003 Document Revised: 06/19/2015 Document Reviewed: 12/02/2014 Elsevier Interactive Patient Education  2017 [employer].     Hemorrhoids Hemorrhoids are swollen veins in and around  the rectum or anus. There are two types of hemorrhoids:  Internal hemorrhoids. These occur in the veins that are just inside the rectum. They may poke through to the outside and become irritated and painful.  External hemorrhoids. These occur in the veins that are outside of the anus and can be felt as a painful swelling or hard lump near the anus. Most hemorrhoids do not cause serious problems, and they can be managed with home treatments such as diet and lifestyle changes. If home treatments do not help your symptoms, procedures can be done to shrink or remove the hemorrhoids. What are the causes? This condition is caused by increased pressure in the anal area. This pressure may result from various things, including:  Constipation.  Straining to have a bowel movement.  Diarrhea.  Pregnancy.  Obesity.  Sitting for long periods of time.  Heavy lifting or other activity that causes you to strain.  Anal sex. What are the signs or symptoms? Symptoms of this condition include:  Pain.  Anal itching  or irritation.  Rectal bleeding.  Leakage of stool (feces).  Anal swelling.  One or more lumps around the anus. How is this diagnosed? This condition can often be diagnosed through a visual exam. Other exams or tests may also be done, such as:  Examination of the rectal area with a gloved hand (digital rectal exam).  Examination of the anal canal using a small tube (anoscope).  A blood test, if you have lost a significant amount of blood.  A test to look inside the colon (sigmoidoscopy or colonoscopy). How is this treated? This condition can usually be treated at home. However, various procedures may be done if dietary changes, lifestyle changes, and other home treatments do not help your symptoms. These procedures can help make the hemorrhoids smaller or remove them completely. Some of these procedures involve surgery, and others do not. Common procedures include:  Rubber  band ligation. Rubber bands are placed at the base of the hemorrhoids to cut off the blood supply to them.  Sclerotherapy. Medicine is injected into the hemorrhoids to shrink them.  Infrared coagulation. A type of light energy is used to get rid of the hemorrhoids.  Hemorrhoidectomy surgery. The hemorrhoids are surgically removed, and the veins that supply them are tied off.  Stapled hemorrhoidopexy surgery. A circular stapling device is used to remove the hemorrhoids and use staples to cut off the blood supply to them. Follow these instructions at home: Eating and drinking   Eat foods that have a lot of fiber in them, such as whole grains, beans, nuts, fruits, and vegetables. Ask your health care provider about taking products that have added fiber (fiber supplements).  Drink enough fluid to keep your urine clear or pale yellow. Managing pain and swelling   Take warm sitz baths for 20 minutes, 3-4 times a day to ease pain and discomfort.  If directed, apply ice to the affected area. Using ice packs between sitz baths may be helpful.  Put ice in a plastic bag.  Place a towel between your skin and the bag.  Leave the ice on for 20 minutes, 2-3 times a day. General instructions   Take over-the-counter and prescription medicines only as told by your health care provider.  Use medicated creams or suppositories as told.  Exercise regularly.  Go to the bathroom when you have the urge to have a bowel movement. Do not wait.  Avoid straining to have bowel movements.  Keep the anal area dry and clean. Use wet toilet paper or moist towelettes after a bowel movement.  Do not sit on the toilet for long periods of time. This increases blood pooling and pain. Contact a health care provider if:  You have increasing pain and swelling that are not controlled by treatment or medicine.  You have uncontrolled bleeding.  You have difficulty having a bowel movement, or you are unable to have  a bowel movement.  You have pain or inflammation outside the area of the hemorrhoids. This information is not intended to replace advice given to you by your health care provider. Make sure you discuss any questions you have with your health care provider. Document Released: 01/09/2000 Document Revised: 06/11/2015 Document Reviewed: 09/25/2014 Elsevier Interactive Patient Education  2017 [employer]  Diverticulosis Diverticulosis is a condition that develops when small pouches (diverticula) form in the wall of the large intestine (colon). The colon is where water is absorbed and stool is formed. The pouches form when the inside layer of the colon  pushes through weak spots in the outer layers of the colon. You may have a few pouches or many of them. What are the causes? The cause of this condition is not known. What increases the risk? The following factors may make you more likely to develop this condition:  Being older than age 61. Your risk for this condition increases with age. Diverticulosis is rare among people younger than age 53. By age 81, many people have it.  Eating a low-fiber diet.  Having frequent constipation.  Being overweight.  Not getting enough exercise.  Smoking.  Taking over-the-counter pain medicines, like aspirin and ibuprofen.  Having a family history of diverticulosis. What are the signs or symptoms? In most people, there are no symptoms of this condition. If you do have symptoms, they may include:  Bloating.  Cramps in the abdomen.  Constipation or diarrhea.  Pain in the lower left side of the abdomen. How is this diagnosed? This condition is most often diagnosed during an exam for other colon problems. Because diverticulosis usually has no symptoms, it often cannot be diagnosed independently. This condition may be diagnosed by:  Using a flexible scope to examine the colon (colonoscopy).  Taking an X-ray of the colon after dye has been put into  the colon (barium enema).  Doing a CT scan. How is this treated? You may not need treatment for this condition if you have never developed an infection related to diverticulosis. If you have had an infection before, treatment may include:  Eating a high-fiber diet. This may include eating more fruits, vegetables, and grains.  Taking a fiber supplement.  Taking a live bacteria supplement (probiotic).  Taking medicine to relax your colon.  Taking antibiotic medicines. Follow these instructions at home:  Drink 6-8 glasses of water or more each day to prevent constipation.  Try not to strain when you have a bowel movement.  If you have had an infection before:  Eat more fiber as directed by your health care provider or your diet and nutrition specialist (dietitian).  Take a fiber supplement or probiotic, if your health care provider approves.  Take over-the-counter and prescription medicines only as told by your health care provider.  If you were prescribed an antibiotic, take it as told by your health care provider. Do not stop taking the antibiotic even if you start to feel better.  Keep all follow-up visits as told by your health care provider. This is important. Contact a health care provider if:  You have pain in your abdomen.  You have bloating.  You have cramps.  You have not had a bowel movement in 3 days. Get help right away if:  Your pain gets worse.  Your bloating becomes very bad.  You have a fever or chills, and your symptoms suddenly get worse.  You vomit.  You have bowel movements that are bloody or black.  You have bleeding from your rectum. Summary  Diverticulosis is a condition that develops when small pouches (diverticula) form in the wall of the large intestine (colon).  You may have a few pouches or many of them.  This condition is most often diagnosed during an exam for other colon problems.  If you have had an infection related to  diverticulosis, treatment may include increasing the fiber in your diet, taking supplements, or taking medicines. This information is not intended to replace advice given to you by your health care provider. Make sure you discuss any questions you have with your  health care provider. Document Released: 10/09/2003 Document Revised: 12/01/2015 Document Reviewed: 12/01/2015 Elsevier Interactive Patient Education  2017 [employer].

## 2016-06-10 NOTE — Op Note (Signed)
[facility] Patient Name: Richard Simpson Procedure Date: 06/10/2016 7:32 AM MRN: [id number] Date of Birth: 1970/04/29 Attending MD: Norvel Richards , MD CSN: [id number] Age: 46 Admit Type: Outpatient Procedure:                Ileo-colonoscopy with snare polypectomy Indications:              Hematochezia Providers:                Norvel Richards, MD, Jeanann Lewandowsky. Sharon Seller, RN,                            Randa Spike, Technician Referring MD:              Medicines:                Propofol per Anesthesia Complications:            No immediate complications. Estimated Blood Loss:     Estimated blood loss was minimal. Procedure:                Pre-Anesthesia Assessment:                           - Prior to the procedure, a History and Physical                            was performed, and patient medications and                            allergies were reviewed. The patient's tolerance of                            previous anesthesia was also reviewed. The risks                            and benefits of the procedure and the sedation                            options and risks were discussed with the patient.                            All questions were answered, and informed consent                            was obtained. Prior Anticoagulants: The patient has                            taken no previous anticoagulant or antiplatelet                            agents and last took antiplatelet medication 1 day                            prior to the procedure. ASA Grade Assessment: II -  A patient with mild systemic disease. After                            reviewing the risks and benefits, the patient was                            deemed in satisfactory condition to undergo the                            procedure.                           After obtaining informed consent, the colonoscope                            was passed under direct  vision. Throughout the                            procedure, the patient's blood pressure, pulse, and                            oxygen saturations were monitored continuously. The                            EC-[id number] ([id number]) scope was introduced through                            the and advanced to the 5 cm into the ileum. The                            terminal ileum, ileocecal valve, appendiceal                            orifice, and rectum were photographed. The quality                            of the bowel preparation was adequate. Scope In: 7:45:22 AM Scope Out: 8:05:55 AM Scope Withdrawal Time: 0 hours 16 minutes 31 seconds  Total Procedure Duration: 0 hours 20 minutes 33 seconds  Findings:      The perianal and digital rectal examinations were normal.      A 4 mm polyp was found in the ascending colon. The polyp was       semi-pedunculated.      Multiple small and large-mouthed diverticula were found in the sigmoid       colon and descending colon. Estimated blood loss was minimal.      Non-bleeding internal hemorrhoids were found during retroflexion. The       hemorrhoids were Grade II (internal hemorrhoids that prolapse but reduce       spontaneously). The distal 5 cm of terminal ileal mucosa also appeared       normal.      The exam was otherwise without abnormality on direct and retroflexion       views. The polyp was removed with a cold snare. Resection and retrieval       were complete. Estimated  blood loss was minimal. Impression:               - One 4 mm polyp in the ascending colon, removed                            with a cold snare. Resected and retrieved.                           - Diverticulosis in the sigmoid colon and in the                            descending colon.                           - Non-bleeding internal hemorrhoids.                           - The examination was otherwise normal on direct                            and retroflexion views.  I suspect he has bled from                            hemorrhoids in the setting of antiplatelet therapy.                            He is not a good candidate for hemorrhoidal banding                            secondary to obligatory antiplatelet therapy Moderate Sedation:      Moderate (conscious) sedation was personally administered by an       anesthesia professional. The following parameters were monitored: oxygen       saturation, heart rate, blood pressure, respiratory rate, EKG, adequacy       of pulmonary ventilation, and response to care. Total physician       intraservice time was 28 minutes. Recommendation:           - Patient has a contact number available for                            emergencies. The signs and symptoms of potential                            delayed complications were discussed with the                            patient. Return to normal activities tomorrow.                            Written discharge instructions were provided to the                            patient.                           -  Resume previous diet.                           - Continue present medications.                           - Repeat colonoscopy date to be determined after                            pending pathology results are reviewed for                            surveillance.                           - Return to GI office (date not yet determined).                            course of Anusol cream to the anorectum 3 times a                            day. Benefiber 1 tablespoon twice daily.Office                            visit in 2 months                           - Patient has a contact number available for                            emergencies. The signs and symptoms of potential                            delayed complications were discussed with the                            patient. Return to normal activities tomorrow.                            Written  discharge instructions were provided to the                            patient.                           - Resume previous diet.                           - Continue present medications.                           - Return to GI clinic in 2 months. Procedure Code(s):        --- Professional ---                           [phone], Colonoscopy, flexible; with removal of  tumor(s), polyp(s), or other lesion(s) by snare                            technique Diagnosis Code(s):        --- Professional ---                           D12.2, Benign neoplasm of ascending colon                           K64.1, Second degree hemorrhoids                           K92.1, Melena (includes Hematochezia)                           K57.30, Diverticulosis of large intestine without                            perforation or abscess without bleeding CPT copyright 2016 American Medical Association. All rights reserved. The codes documented in this report are preliminary and upon coder review may  be revised to meet current compliance requirements. Richard Simpson. Alexander Mcauley, MD Norvel Richards, MD 06/10/2016 8:18:09 AM This report has been signed electronically. Number of Addenda: 0

## 2016-06-10 NOTE — Anesthesia Procedure Notes (Signed)
Performed by: ADAMS, AMY A       

## 2016-06-14 ENCOUNTER — Ambulatory Visit: Payer: PRIVATE HEALTH INSURANCE | Admitting: Cardiovascular Disease

## 2016-06-14 ENCOUNTER — Encounter: Payer: Self-pay | Admitting: Internal Medicine

## 2016-06-14 NOTE — Progress Notes (Signed)
error 

## 2016-06-16 ENCOUNTER — Encounter (HOSPITAL_COMMUNITY): Payer: Self-pay | Admitting: Internal Medicine

## 2016-06-23 ENCOUNTER — Ambulatory Visit: Payer: PRIVATE HEALTH INSURANCE | Admitting: Cardiovascular Disease

## 2016-07-04 NOTE — Progress Notes (Signed)
 Cardiology Office Note    Date:  07/05/2016   ID:  Richard Simpson, DOB 04/24/1970, MRN [id number]  PCP:  Daniel, Terry G, MD  Cardiologist: Dr. Kelly  Chief Complaint  Patient presents with  . Follow-up    6 months  . Shortness of Breath    on exertion    History of Present Illness:    Richard Simpson is a 45 y.o. male with past medical history of CAD (s/p DES to LAD in 06/2014, patent by cath in 10/2015), ischemic cardiomyopathy (EF 45% at time of cath, now improved to 60-65%), HTN, and HLD who presents to the office today for 6-month follow-up.   He was last examined by Rhonda Barrett, PA-C in 11/2015 and reported continued episodes of chest pain and dyspnea despite his recent cath showing a patent stent. He was started on Imdur 15mg daily to see if that would help with his symptoms.   In talking with the patient today, he reports continued episodes of dyspnea on exertion which has occurred ever since stent placement. He is able to walk around the grocery store without symptoms, but notices this mostly when performing yard work or walking on the treadmill at the gym. He denies any acute worsening of his symptoms since 06/2014. He was referred to Pulmonology and by  his report says everything was "fine". He still experiences occasional episodes of chest tightness but says this has significantly improved since starting Imdur 15 mg daily. He has not had to take SL nitroglycerin.   He denies any recent orthopnea, PND, or lower extremity edema. He does check his blood pressure regularly at home and notes it is usually in the 110's/70's.   Reports good compliance with his medication regimen, including ASA and Brilinta. Notes his co-pay for Brilinta continues to increase (at $70 now) and he would prefer a more reasonably priced option.    Past Medical History:  Diagnosis Date  . Anxiety   . CAD S/P percutaneous coronary angioplasty 10/2013   a. 10/2013 Cath: mod LAD dzs->nl FFR->  medically managed;  b. 06/2014 NSTEMI/PCi: LAD 99ost, 70p, 50m (entire area covered with a 3.5x20 Synergy DES), LCX nl, RCA 20m, 10d, EF 45-50%. c. 10/2015: cath showing patent LAD stent with 15% Prox LAD stenosis. EF 55-65%.   . DEGENERATIVE DISC DISEASE, THORACIC SPINE 09/16/2009   Qualifier: Diagnosis of  By: Harrison MD, Stanley    . Depression   . Dyslipidemia, goal LDL below 70   . Essential hypertension   . GERD (gastroesophageal reflux disease)   . Headache   . History of kidney stones   . Ischemic cardiomyopathy - Resolved 06/2014   a. 06/2014 EF 45-50% by LV gram b. Echo July 2016: EF 60-65%. No RWMA. Gr 1 DD   . NSTEMI (non-ST elevated myocardial infarction) (HCC) 06/2014  . Obesity (BMI 30-39.9) 10/29/2013  . Primary snoring 10/29/2013  . Rheumatoid arthritis (HCC) 09/23/2013  . RLS (restless legs syndrome) 10/29/2013    Past Surgical History:  Procedure Laterality Date  . CARDIAC CATHETERIZATION N/A 07/05/2014   Procedure: Left Heart Cath and Coronary Angiography;  Surgeon: Thomas A Kelly, MD;  Location: [facility];  Service: Cardiovascular;  NSTEMI --> Ostial LAd 99%, prox 80% & early mid 50% --> PCI  . CARDIAC CATHETERIZATION  07/05/2014   Procedure: Coronary Stent Intervention;  Surgeon: Thomas A Kelly, MD;  Location: [facility];  Service: Cardiovascular; Ostial-prox LAD tandem 99, 80 & 50% -->   Synergy DES 3.5 x 20 (3.74)  . CARDIAC CATHETERIZATION N/A 03/24/2015   Procedure: Left Heart Cath and Coronary Angiography;  Surgeon: Leonie Man, MD;  Location: [place] [facility];  Service: Cardiovascular;  Laterality: N/A;  . CARDIAC CATHETERIZATION N/A 11/20/2015   Procedure: Left Heart Cath and Coronary Angiography;  Surgeon: Troy Sine, MD;  Location: [place] CV LAB;  Service: Cardiovascular;  Laterality: N/A;  . CHOLECYSTECTOMY     10 yrs ago-jenkins  . COLONOSCOPY N/A 03/23/2013   Procedure: COLONOSCOPY;  Surgeon: Daneil Dolin, MD;  Location: [facility];  Service: Endoscopy;  Laterality: N/A;  2:15-moved to 1030 Staff notified pt  . COLONOSCOPY WITH PROPOFOL N/A 06/10/2016   Procedure: COLONOSCOPY WITH PROPOFOL;  Surgeon: Daneil Dolin, MD;  Location: [facility];  Service: Endoscopy;  Laterality: N/A;  730   . FRACTIONAL FLOW RESERVE WIRE  11/23/2013   Procedure: FRACTIONAL FLOW RESERVE WIRE;  Surgeon: Jettie Booze, MD;  Location: [facility] CATH LAB;  Service: Cardiovascular;;  . GALLBLADDER SURGERY    . LEFT HEART CATHETERIZATION WITH CORONARY ANGIOGRAM N/A 11/23/2013   Procedure: LEFT HEART CATHETERIZATION WITH CORONARY ANGIOGRAM;  Surgeon: Jettie Booze, MD;  Location: [facility] CATH LAB;  Service: Cardiovascular;  Laterality: N/A;  . MASS EXCISION  09/09/2010   Procedure: EXCISION MASS;  Surgeon: Arther Abbott, MD;  Location: [facility];  Service: Orthopedics;  Laterality: Left;  Excision Mass Left Long Finger  . POLYPECTOMY  06/10/2016   Procedure: POLYPECTOMY;  Surgeon: Daneil Dolin, MD;  Location: [facility];  Service: Endoscopy;;  colon  . THROAT SURGERY     nodules removed from throat as child  . TRANSTHORACIC ECHOCARDIOGRAM   July 2016:    EF 60-65%. No RWMA. Gr 1 DD    Current Medications: Outpatient Medications Prior to Visit  Medication Sig Dispense Refill  . amLODipine (NORVASC) 5 MG tablet Take 0.5 tablets (2.5 mg total) by mouth daily. (Patient taking differently: Take 5 mg by mouth daily. ) 30 tablet 6  . aspirin EC 81 MG tablet Take 1 tablet (81 mg total) by mouth daily. 30 tablet 3  . atorvastatin (LIPITOR) 80 MG tablet TAKE ONE TABLET BY MOUTH ONCE DAILY. 30 tablet 8  . clonazePAM (KLONOPIN) 0.5 MG tablet Take 0.5 mg by mouth 2 (two) times daily as needed for anxiety (takes 1 time daily and 2 nd dose only if needed).     . furosemide (LASIX) 20 MG tablet TAKE ONE TABLET BY MOUTH ONCE DAILY. 30 tablet 0  . InFLIXimab (REMICADE IV) Inject into the vein as directed. Every 6 weeks    . isosorbide mononitrate  (IMDUR) 30 MG 24 hr tablet Take 0.5 tablets (15 mg total) by mouth daily. 45 tablet 3  . loratadine (CLARITIN) 10 MG tablet Take 10 mg by mouth daily.     . metoprolol tartrate (LOPRESSOR) 25 MG tablet TAKE (1) TABLET BY MOUTH TWICE DAILY. 60 tablet 10  . nitroGLYCERIN (NITROSTAT) 0.4 MG SL tablet Take one tab sublingual when experiencing chest tightness and shortness of breath. 25 tablet 4  . peg 3350 powder (MOVIPREP) 100 g SOLR Take 1 kit (200 g total) by mouth as directed. 1 kit 0  . ranitidine (ZANTAC) 150 MG capsule Take 150 mg by mouth every evening.     . traMADol (ULTRAM) 50 MG tablet Take 50 mg by mouth every 6 (six) hours as needed for moderate pain.     Marland Kitchen  BRILINTA 90 MG TABS tablet TAKE (1) TABLET BY MOUTH TWICE DAILY. 60 tablet 10  . losartan (COZAAR) 25 MG tablet Take 25 mg  In the morning , and 12.5. mg in the evening 135 tablet 3   No facility-administered medications prior to visit.      Allergies:   Lisinopril and Prednisone   Social History   Social History  . Marital status: Married    Spouse name: Renee  . Number of children: 2  . Years of education: 12th grade   Occupational History  . Retired police officer [employer]   Social History Main Topics  . Smoking status: Former Smoker    Packs/day: 0.25    Years: 15.00    Types: Cigarettes    Quit date: 09/04/2003  . Smokeless tobacco: Current User    Types: Chew  . Alcohol use 0.0 oz/week    1 - 2 Cans of beer per week     Comment: rare  . Drug use: No  . Sexual activity: Yes   Other Topics Concern  . None   Social History Narrative   Patient lives at home with his wife (Renee).    Patient works full time - [employer]   Education high school.   Right handed.   Caffeine one cup of coffee daily and one soda.     Family History:  The patient's family history includes Arthritis in his paternal uncle; Cancer in his maternal grandfather; Diabetes in his maternal grandmother, mother,  and paternal grandfather; Hypertension in his maternal grandmother and mother.   Review of Systems:   Please see the history of present illness.     General:  No chills, fever, night sweats or weight changes.  Cardiovascular:  No edema, orthopnea, palpitations, paroxysmal nocturnal dyspnea. Positive for chest pain and dyspnea on exertion.  Dermatological: No rash, lesions/masses Respiratory: No cough, dyspnea Urologic: No hematuria, dysuria Abdominal:   No nausea, vomiting, diarrhea, bright red blood per rectum, melena, or hematemesis Neurologic:  No visual changes, wkns, changes in mental status. All other systems reviewed and are otherwise negative except as noted above.   Physical Exam:    VS:  BP 119/86   Pulse 72   Ht 6' 1" (1.854 m)   Wt 262 lb 9.6 oz (119.1 kg)   BMI 34.65 kg/m    General: Well developed, well nourished overweight Caucasian male appearing in no acute distress. Head: Normocephalic, atraumatic, sclera non-icteric, no xanthomas, nares are without discharge.  Neck: No carotid bruits. JVD not elevated.  Lungs: Respirations regular and unlabored, without wheezes or rales.  Heart: Regular rate and rhythm. No S3 or S4.  No murmur, no rubs, or gallops appreciated. Abdomen: Soft, non-tender, non-distended with normoactive bowel sounds. No hepatomegaly. No rebound/guarding. No obvious abdominal masses. Msk:  Strength and tone appear normal for age. No joint deformities or effusions. Extremities: No clubbing or cyanosis. No lower extremity edema.  Distal pedal pulses are 2+ bilaterally. Neuro: Alert and oriented X 3. Moves all extremities spontaneously. No focal deficits noted. Psych:  Responds to questions appropriately with a normal affect. Skin: No rashes or lesions noted  Wt Readings from Last 3 Encounters:  07/05/16 262 lb 9.6 oz (119.1 kg)  06/09/16 264 lb 12.8 oz (120.1 kg)  12/04/15 259 lb 12.8 oz (117.8 kg)     Studies/Labs Reviewed:   EKG:  EKG is not  ordered today. EKG from 06/09/2016 is reviewed and shows NSR, HR 69,   with known TWI in Lead III. No acute changes when compared to prior tracings.   Recent Labs: 06/09/2016: BUN 19; Creatinine, Ser 0.89; Hemoglobin 16.0; Platelets 179; Potassium 3.7; Sodium 135   Lipid Panel    Component Value Date/Time   CHOL 103 04/16/2015 0820   CHOL 114 08/06/2014 0746   TRIG 70 04/16/2015 0820   HDL 28 (L) 04/16/2015 0820   HDL 28 (L) 08/06/2014 0746   CHOLHDL 3.7 04/16/2015 0820   VLDL 14 04/16/2015 0820   LDLCALC 61 04/16/2015 0820   LDLCALC 63 08/06/2014 0746    Additional studies/ records that were reviewed today include:   Cardiac Catheterization: 10/2015  Ost LAD lesion, 0 %stenosed.  Prox LAD to Mid LAD lesion, 15 %stenosed.  The left ventricular ejection fraction is 55-65% by visual estimate.  LV end diastolic pressure is mildly elevated.   No residual significant CAD with a widely patent LAD stent extending from the ostium extending proximally and ending prior to the takeoff of the first diagonal vessel.  There is mild 15% narrowing in the LAD after the diagonal vessel, which is actually improved from previously. The left circumflex, ramus intermediate  and the dominant RCA vessels are normal.  Normal LV function with an ejection fraction at approximately 60-65% without focal segmental wall motion abnormalities.  RECOMMENDATION: Medical therapy.  Consider possible noncardiac etiology to the patient's nitrate responsive chest pain.  Assessment:    1. Coronary artery disease of native artery of native heart with stable angina pectoris (HCC)   2. Essential hypertension   3. Hyperlipidemia LDL goal <70   4. Obesity (BMI 30-39.9)      Plan:   In order of problems listed above:  1. CAD - s/p DES to LAD in 06/2014. Recent cath in 10/2015 showed a patent stent with 15% Prox-LAD stenosis. EF was preserved at 60-65% with no WMA. Continued medical therapy was recommended. - he  reports continued episodes of dyspnea on exertion which has occurred ever since stent placement, with no acute worsening of his symptoms. He is able to walk around the grocery store without symptoms, but notices this mostly when performing yard work or walking on the treadmill at the gym. Episodes of chest tightness have improved since starting Imdur 15 mg daily. - recent EKG showed no acute ischemic changes. Would not pursue repeat testing at this time with his cath in 10/2015 showing a patent stent and no acute worsening of his symptoms. - will reduce Losartan to 25mg daily with instructions to titrate Imdur to 30mg daily if SBP remains > 110, as he reports improvement in his symptoms with this. Continue BB and statin therapy. - he has remained on DAPT with ASA and Brilinta for over 2 years. He notes Brilinta is not affordable for him as his co-pay continues to increase. Will switch Brilinta to Plavix. I have sent a staff message to Dr. Kelly asking if he wishes for him to remain on both ASA and Plavix or Plavix alone (continue both for now).   2. HTN - BP has been soft in the 110's/70's per the patient's report. At 119/86 during today's visit. - will reduce Losartan to 25mg daily with instructions to increase Imdur as above. Continue Lopressor 25mg BID and Amlodipine 2.5mg daily (for antianginal effect).   3. HLD - recent lab draw obtained by PCP - patient is meeting with him today to review his labs.  - last Lipid Panel in 03/2015 showed total cholesterol of 103,   HDL 28, and LDL 61. At goal with LDL < 70 in the setting of known CAD. - continue Atorvastatin 80mg daily.   4. Obesity - BMI at 34.65.  - continued diet and exercise encouraged.    Medication Adjustments/Labs and Tests Ordered: Current medicines are reviewed at length with the patient today.  Concerns regarding medicines are outlined above.  Medication changes, Labs and Tests ordered today are listed in the Patient Instructions  below. Patient Instructions  Medication Instructions:  STOP BRILINTA START PLAVIX 75MG DAILY START LOSARTAN 25 MG IN THE MORNING  If you need a refill on your cardiac medications before your next appointment, please call your pharmacy.  Follow-Up: Your physician wants you to follow-up in: 6 MONTH WITH DR KELLY You will receive a reminder letter in the mail two months in advance. If you don't receive a letter, please call our office OCTOBER to schedule the DECEMBER follow-up appointment.  Special Instructions: IF SYSTOLIC BLOOD PRESSURE IS >110 INCREASE IMDUR TO 30MG   Thank you for choosing [facility]!!      Signed, Brittany M Strader, PA-C  07/05/2016 10:19 AM    [facility] [address], [address] [place], [place]  [address] Phone: [phone]; Fax: [phone]  [address], [address] [place], [place] [address] Phone: [phone]  

## 2016-07-05 ENCOUNTER — Ambulatory Visit (INDEPENDENT_AMBULATORY_CARE_PROVIDER_SITE_OTHER): Payer: PRIVATE HEALTH INSURANCE | Admitting: Student

## 2016-07-05 ENCOUNTER — Other Ambulatory Visit: Payer: Self-pay | Admitting: Cardiovascular Disease

## 2016-07-05 ENCOUNTER — Encounter: Payer: Self-pay | Admitting: Student

## 2016-07-05 VITALS — BP 119/86 | HR 72 | Ht 73.0 in | Wt 262.6 lb

## 2016-07-05 DIAGNOSIS — I25118 Atherosclerotic heart disease of native coronary artery with other forms of angina pectoris: Secondary | ICD-10-CM | POA: Diagnosis not present

## 2016-07-05 DIAGNOSIS — E785 Hyperlipidemia, unspecified: Secondary | ICD-10-CM

## 2016-07-05 DIAGNOSIS — I10 Essential (primary) hypertension: Secondary | ICD-10-CM

## 2016-07-05 DIAGNOSIS — E669 Obesity, unspecified: Secondary | ICD-10-CM | POA: Diagnosis not present

## 2016-07-05 MED ORDER — LOSARTAN POTASSIUM 25 MG PO TABS: ORAL_TABLET | ORAL | 3 refills | Status: DC

## 2016-07-05 MED ORDER — CLOPIDOGREL BISULFATE 75 MG PO TABS: 75.0000 mg | ORAL_TABLET | Freq: Every day | ORAL | 5 refills | Status: DC

## 2016-07-05 NOTE — Patient Instructions (Signed)
Medication Instructions:  STOP BRILINTA START PLAVIX 75MG  DAILY START LOSARTAN 25 MG IN THE MORNING  If you need a refill on your cardiac medications before your next appointment, please call your pharmacy.  Follow-Up: Your physician wants you to follow-up in: [place] will receive a reminder letter in the mail two months in advance. If you don't receive a letter, please call our office OCTOBER to schedule the [facility] follow-up appointment.  Special Instructions: IF SYSTOLIC BLOOD PRESSURE IS >110 INCREASE IMDUR TO 30MG    Thank you for choosing [facility]!!

## 2016-07-07 ENCOUNTER — Other Ambulatory Visit: Payer: Self-pay | Admitting: *Deleted

## 2016-07-07 DIAGNOSIS — Z79899 Other long term (current) drug therapy: Secondary | ICD-10-CM

## 2016-07-08 ENCOUNTER — Telehealth: Payer: Self-pay | Admitting: *Deleted

## 2016-07-08 NOTE — Telephone Encounter (Signed)
Called and notified patient per [place] to go to [facility] in 2 weeks to have P2y12 drawn. Order is in the computer. Patient voiced verbal understanding of orders given to him.

## 2016-07-22 ENCOUNTER — Other Ambulatory Visit (HOSPITAL_COMMUNITY)
Admission: RE | Admit: 2016-07-22 | Discharge: 2016-07-22 | Disposition: A | Discharge status: Home / Self Care | Payer: PRIVATE HEALTH INSURANCE | Source: Ambulatory Visit | Attending: Student | Admitting: Student

## 2016-07-22 ENCOUNTER — Encounter: Payer: Self-pay | Admitting: *Deleted

## 2016-07-22 DIAGNOSIS — Z79899 Other long term (current) drug therapy: Secondary | ICD-10-CM | POA: Insufficient documentation

## 2016-07-22 LAB — PLATELET INHIBITION P2Y12: PLATELET FUNCTION P2Y12: 136 [PRU] — AB (ref 194–418)

## 2016-09-24 ENCOUNTER — Other Ambulatory Visit: Payer: Self-pay | Admitting: Cardiovascular Disease

## 2016-10-27 ENCOUNTER — Other Ambulatory Visit: Payer: Self-pay | Admitting: Cardiovascular Disease

## 2016-11-22 ENCOUNTER — Other Ambulatory Visit: Payer: Self-pay | Admitting: Cardiovascular Disease

## 2016-11-22 NOTE — Telephone Encounter (Signed)
REFILL 

## 2016-12-22 ENCOUNTER — Other Ambulatory Visit: Payer: Self-pay | Admitting: Student

## 2016-12-22 ENCOUNTER — Other Ambulatory Visit: Payer: Self-pay | Admitting: Physician Assistant

## 2016-12-23 NOTE — Telephone Encounter (Signed)
This is Dr. Kelly's pt. °

## 2016-12-23 NOTE — Telephone Encounter (Signed)
Rx(s) sent to pharmacy electronically.  

## 2016-12-28 ENCOUNTER — Ambulatory Visit (INDEPENDENT_AMBULATORY_CARE_PROVIDER_SITE_OTHER): Payer: PRIVATE HEALTH INSURANCE | Admitting: Physician Assistant

## 2016-12-28 ENCOUNTER — Other Ambulatory Visit: Payer: Self-pay | Admitting: Physician Assistant

## 2016-12-28 ENCOUNTER — Telehealth (HOSPITAL_COMMUNITY): Payer: Self-pay | Admitting: Physician Assistant

## 2016-12-28 VITALS — BP 118/83 | HR 60 | Ht 73.0 in | Wt 269.2 lb

## 2016-12-28 DIAGNOSIS — E785 Hyperlipidemia, unspecified: Secondary | ICD-10-CM

## 2016-12-28 DIAGNOSIS — R079 Chest pain, unspecified: Secondary | ICD-10-CM

## 2016-12-28 DIAGNOSIS — I10 Essential (primary) hypertension: Secondary | ICD-10-CM

## 2016-12-28 DIAGNOSIS — I25118 Atherosclerotic heart disease of native coronary artery with other forms of angina pectoris: Secondary | ICD-10-CM | POA: Diagnosis not present

## 2016-12-28 DIAGNOSIS — R0602 Shortness of breath: Secondary | ICD-10-CM | POA: Diagnosis not present

## 2016-12-28 MED ORDER — ISOSORBIDE MONONITRATE ER 30 MG PO TB24: 30.0000 mg | ORAL_TABLET | Freq: Every day | ORAL | 1 refills | Status: DC

## 2016-12-28 NOTE — Patient Instructions (Signed)
Medication Instructions:   INCREASE Imdur to 30mg  daily (from 1/2 to whole tablet).  Continue to check your blood pressure at home. If for some reason your blood pressure begins to run below 100 on the top number (systolic reading), give our office a call as we may need to stop your losartan.  Labwork:   None  Testing/Procedures:  Your physician has requested that you have an echocardiogram. Echocardiography is a painless test that uses sound waves to create images of your heart. It provides your doctor with information about the size and shape of your heart and how well your heart's chambers and valves are working. This procedure takes approximately one hour. There are no restrictions for this procedure.  Your physician has recommended that you have a pulmonary function test. Pulmonary Function Tests are a group of tests that measure how well air moves in and out of your lungs.   Follow-Up:  In 2-3 weeks with Dr. Claiborne Billings - we will have someone contact you to help set up this appointment.  If you need a refill on your cardiac medications before your next appointment, please call your pharmacy.

## 2016-12-28 NOTE — Progress Notes (Addendum)
 Cardiology Office Note    Date:  12/30/2016   ID:  Richard Simpson, DOB 04/11/1970, MRN [id number]  PCP:  Daniel, Terry G, MD  Cardiologist:  Dr. Kelly  Chief Complaint  Patient presents with  . Follow-up    seen for Dr. Kelly    History of Present Illness:  Richard Simpson is a 46 y.o. male with PMH of CAD (DES to LAD 06/2014, patent by cath 10/2015), ICM with improved EF, HTN, HLD.  Although he previously had a decreased EF of 45-50% at the time of cardiac catheterization in 2016, echocardiogram obtained in July 2016 showed EF 60-65%.  Due to recurrent exertional chest discomfort, patient underwent cardiac catheterization again in October 2017 which showed patent stent, no significant residual CAD, normal EF.  He was last seen by Brittainy Strader on 07/05/2016, his Brilinta was switched to Plavix due to cost reasons. His P2Y12 136, indicative of strong platelet inhibition.  His losartan was also decreased to 25 mg daily.  He presents today for cardiology office visit.  He continued to have exertional dyspnea which has been unchanged.  He is retired from work and does not do much activity according to his wife.  He did go hunting however says he is unable to walk for more than a half a mile.  He does have occasional chest pain in the last 2 weeks.  He has taken nitroglycerin 5 times.  The chest pain is not worse with deep inspiration, body rotation on palpation.  It does not matter if he is at rest or with exertion.  It does not seem to worsen with exertion either.  He complains of hand swelling, but no lower extremity edema, orthopnea or PND.  I reviewed his last week catheter report with Dr. Kelly, his EKG today showed no significant ST-T wave changes.  Given lack of EKG changes, I would not recommend another cardiac catheterization.  Instead, I increase his Imdur to 30 mg daily.  I also recommended echocardiogram and PFT for his shortness of breath.  He does have diminished breath sounds in  bilateral bases.  He had a previous PFT in 2015.  He will return in 2 weeks for reassessment, if his symptoms does not improve, may consider Myoview.   Past Medical History:  Diagnosis Date  . Anxiety   . CAD S/P percutaneous coronary angioplasty 10/2013   a. 10/2013 Cath: mod LAD dzs->nl FFR-> medically managed;  b. 06/2014 NSTEMI/PCi: LAD 99ost, 70p, 50m (entire area covered with a 3.5x20 Synergy DES), LCX nl, RCA 20m, 10d, EF 45-50%. c. 10/2015: cath showing patent LAD stent with 15% Prox LAD stenosis. EF 55-65%.   . DEGENERATIVE DISC DISEASE, THORACIC SPINE 09/16/2009   Qualifier: Diagnosis of  By: Harrison MD, Stanley    . Depression   . Dyslipidemia, goal LDL below 70   . Essential hypertension   . GERD (gastroesophageal reflux disease)   . Headache   . History of kidney stones   . Ischemic cardiomyopathy - Resolved 06/2014   a. 06/2014 EF 45-50% by LV gram b. Echo July 2016: EF 60-65%. No RWMA. Gr 1 DD   . NSTEMI (non-ST elevated myocardial infarction) (HCC) 06/2014  . Obesity (BMI 30-39.9) 10/29/2013  . Primary snoring 10/29/2013  . Rheumatoid arthritis (HCC) 09/23/2013  . RLS (restless legs syndrome) 10/29/2013    Past Surgical History:  Procedure Laterality Date  . CARDIAC CATHETERIZATION N/A 07/05/2014   Procedure: Left Heart Cath and Coronary Angiography;    Surgeon: Thomas A Kelly, MD;  [facility]: [facility];  Service: Cardiovascular;  NSTEMI --> Ostial LAd 99%, prox 80% & early mid 50% --> PCI  . CARDIAC CATHETERIZATION  07/05/2014   Procedure: Coronary Stent Intervention;  Surgeon: Thomas A Kelly, MD;  Location: [facility];  Service: Cardiovascular; Ostial-prox LAD tandem 99, 80 & 50% --> Synergy DES 3.5 x 20 (3.74)  . CARDIAC CATHETERIZATION N/A 03/24/2015   Procedure: Left Heart Cath and Coronary Angiography;  Surgeon: David W Harding, MD;  Location: [facility];  Service: Cardiovascular;  Laterality: N/A;  . CARDIAC CATHETERIZATION N/A 11/20/2015    Procedure: Left Heart Cath and Coronary Angiography;  Surgeon: Thomas A Kelly, MD;  Location: [facility];  Service: Cardiovascular;  Laterality: N/A;  . CHOLECYSTECTOMY     10 yrs ago-jenkins  . COLONOSCOPY N/A 03/23/2013   Procedure: COLONOSCOPY;  Surgeon: Robert M [name], MD;  Location: [facility];  Service: Endoscopy;  Laterality: N/A;  2:15-moved to 1030 Staff notified pt  . COLONOSCOPY WITH PROPOFOL N/A 06/10/2016   Procedure: COLONOSCOPY WITH PROPOFOL;  Surgeon: [name], Robert M, MD;  Location: [facility];  Service: Endoscopy;  Laterality: N/A;  730   . FRACTIONAL FLOW RESERVE WIRE  11/23/2013   Procedure: FRACTIONAL FLOW RESERVE WIRE;  Surgeon: Jayadeep S Varanasi, MD;  Location: [facility];  Service: Cardiovascular;;  . GALLBLADDER SURGERY    . LEFT HEART CATHETERIZATION WITH CORONARY ANGIOGRAM N/A 11/23/2013   Procedure: LEFT HEART CATHETERIZATION WITH CORONARY ANGIOGRAM;  Surgeon: Jayadeep S Varanasi, MD;  Location: [facility];  Service: Cardiovascular;  Laterality: N/A;  . MASS EXCISION  09/09/2010   Procedure: EXCISION MASS;  Surgeon: Stanley Harrison, MD;  Location: [facility];  Service: Orthopedics;  Laterality: Left;  Excision Mass Left Long Finger  . POLYPECTOMY  06/10/2016   Procedure: POLYPECTOMY;  Surgeon: [name], Robert M, MD;  Location: [facility];  Service: Endoscopy;;  colon  . THROAT SURGERY     nodules removed from throat as child  . TRANSTHORACIC ECHOCARDIOGRAM   July 2016:    EF 60-65%. No RWMA. Gr 1 DD    Current Medications: Outpatient Medications Prior to Visit  Medication Sig Dispense Refill  . amLODipine (NORVASC) 5 MG tablet TAKE 1 TABLET BY MOUTH ONCE A DAY. (Patient taking differently: TAKE 1/2 TABLET BY MOUTH ONCE A DAY.) 30 tablet 5  . aspirin EC 81 MG tablet Take 1 tablet (81 mg total) by mouth daily. 30 tablet 3  . atorvastatin (LIPITOR) 80 MG tablet TAKE ONE TABLET BY MOUTH ONCE DAILY. 30 tablet 5  . clonazePAM (KLONOPIN) 0.5 MG tablet Take  0.5 mg by mouth 2 (two) times daily as needed for anxiety (takes 1 time daily and 2 nd dose only if needed).     . clopidogrel (PLAVIX) 75 MG tablet TAKE 1 TABLET BY MOUTH ONCE A DAY. 30 tablet 0  . furosemide (LASIX) 20 MG tablet TAKE ONE TABLET BY MOUTH ONCE DAILY. 30 tablet 0  . InFLIXimab (REMICADE IV) Inject into the vein as directed. Every 6 weeks    . loratadine (CLARITIN) 10 MG tablet Take 10 mg by mouth daily.     . losartan (COZAAR) 25 MG tablet Take 25 mg  In the morning 135 tablet 3  . metoprolol tartrate (LOPRESSOR) 25 MG tablet TAKE (1) TABLET BY MOUTH TWICE DAILY. 60 tablet 5  . peg 3350 powder (MOVIPREP) 100 g SOLR Take 1   kit (200 g total) by mouth as directed. 1 kit 0  . ranitidine (ZANTAC) 150 MG capsule Take 150 mg by mouth 2 (two) times daily.     . traMADol (ULTRAM) 50 MG tablet Take 50 mg by mouth every 6 (six) hours as needed for moderate pain.     . isosorbide mononitrate (IMDUR) 30 MG 24 hr tablet TAKE 1/2 TABLET BY MOUTH DAILY. 45 tablet 2  . nitroGLYCERIN (NITROSTAT) 0.4 MG SL tablet Take one tab sublingual when experiencing chest tightness and shortness of breath. 25 tablet 4   No facility-administered medications prior to visit.      Allergies:   Lisinopril and Prednisone   Social History   Socioeconomic History  . Marital status: Married    Spouse name: [name]  . Number of children: 2  . Years of education: 12th grade  . Highest education level: None  Social Needs  . Financial resource strain: None  . Food insecurity - worry: None  . Food insecurity - inability: None  . Transportation needs - medical: None  . Transportation needs - non-medical: None  Occupational History  . Occupation: Retired Chemical engineer: [employer]  Tobacco Use  . Smoking status: Former Smoker    Packs/day: 0.25    Years: 15.00    Pack years: 3.75    Types: Cigarettes    Last attempt to quit: 09/04/2003    Years since quitting: 13.3  . Smokeless  tobacco: Current User    Types: Chew  Substance and Sexual Activity  . Alcohol use: Yes    Alcohol/week: 0.0 oz    Types: 1 - 2 Cans of beer per week    Comment: rare  . Drug use: No  . Sexual activity: Yes  Other Topics Concern  . None  Social History Narrative   Patient lives at home with his wife [name]).    Patient works full time Dentist..   Education high school.   Right handed.   Caffeine one cup of coffee daily and one soda.     Family History:  The patient's family history includes Arthritis in his paternal uncle; Cancer in his maternal grandfather; Diabetes in his maternal grandmother, mother, paternal grandfather, unknown relative, and unknown relative; Hypertension in his maternal grandmother and mother.   ROS:   Please see the history of present illness.    ROS All other systems reviewed and are negative.   PHYSICAL EXAM:   VS:  BP 118/83   Pulse 60   Ht 6' 1" (1.854 m)   Wt 269 lb 3.2 oz (122.1 kg)   BMI 35.52 kg/m    GEN: Well nourished, well developed, in no acute distress  HEENT: normal  Neck: no JVD, carotid bruits, or masses Cardiac: RRR; no murmurs, rubs, or gallops,no edema  Respiratory: Decreased breath sounds bilateral bases GI: soft, nontender, nondistended, + BS MS: no deformity or atrophy  Skin: warm and dry, no rash Neuro:  Alert and Oriented x 3, Strength and sensation are intact Psych: euthymic mood, full affect  Wt Readings from Last 3 Encounters:  12/28/16 269 lb 3.2 oz (122.1 kg)  07/05/16 262 lb 9.6 oz (119.1 kg)  06/09/16 264 lb 12.8 oz (120.1 kg)      Studies/Labs Reviewed:   EKG:  EKG is ordered today.  The ekg ordered today demonstrates normal sinus rhythm without significant ST-T wave changes.  Chronic T wave inversion in lead III, not  on adjacent leads, unchanged compared to the previous EKG.  Recent Labs: 06/09/2016: BUN 19; Creatinine, Ser 0.89; Hemoglobin 16.0; Platelets 179; Potassium 3.7; Sodium 135    Lipid Panel    Component Value Date/Time   CHOL 103 04/16/2015 0820   CHOL 114 08/06/2014 0746   TRIG 70 04/16/2015 0820   HDL 28 (L) 04/16/2015 0820   HDL 28 (L) 08/06/2014 0746   CHOLHDL 3.7 04/16/2015 0820   VLDL 14 04/16/2015 0820   LDLCALC 61 04/16/2015 0820   LDLCALC 63 08/06/2014 0746    Additional studies/ records that were reviewed today include:   Cath 11/20/2015 Conclusion     Ost LAD lesion, 0 %stenosed.  Prox LAD to Mid LAD lesion, 15 %stenosed.  The left ventricular ejection fraction is 55-65% by visual estimate.  LV end diastolic pressure is mildly elevated.   No residual significant CAD with a widely patent LAD stent extending from the ostium extending proximally and ending prior to the takeoff of the first diagonal vessel.  There is mild 15% narrowing in the LAD after the diagonal vessel, which is actually improved from previously. The left circumflex, ramus intermediate  and the dominant RCA vessels are normal.  Normal LV function with an ejection fraction at approximately 60-65% without focal segmental wall motion abnormalities.  RECOMMENDATION: Medical therapy.  Consider possible noncardiac etiology to the patient's nitrate responsive chest pain.      ASSESSMENT:    1. Shortness of breath   2. Chest pain, unspecified type   3. Coronary artery disease involving native coronary artery of native heart with other form of angina pectoris (HCC)   4. Essential hypertension   5. Hyperlipidemia, unspecified hyperlipidemia type      PLAN:  In order of problems listed above:  1. Chest pain and dyspnea on exertion: Has been chronic in nature, chest discomfort occurring more often recently.  Chest pain occurs at rest.  I have discussed this case with Dr. Kelly, given the fact that he has had to relook cath before both showed patent stents.  We want to do a trial of medical therapy first.  I increase his Imdur to 30 mg daily.  He will return for  follow-up in 2-3 weeks, if symptom worsens, then we will consider further ischemic workup.  Obtain echocardiogram for evaluation of dyspnea.  He also has diminished breath sounds in bilateral bases, I will obtain PFT.  2. CAD: History of PCI to LAD, has had to relook cardiac catheterization since which showed patent stents.  3. Hypertension: Pressure stable, increasing Imdur to 30 mg daily for antianginal purposes  4. Hyperlipidemia: On Lipitor 80 mg daily.    Medication Adjustments/Labs and Tests Ordered: Current medicines are reviewed at length with the patient today.  Concerns regarding medicines are outlined above.  Medication changes, Labs and Tests ordered today are listed in the Patient Instructions below. Patient Instructions  Medication Instructions:   INCREASE Imdur to 30mg daily (from 1/2 to whole tablet).  Continue to check your blood pressure at home. If for some reason your blood pressure begins to run below 100 on the top number (systolic reading), give our office a call as we may need to stop your losartan.  Labwork:   None  Testing/Procedures:  Your physician has requested that you have an echocardiogram. Echocardiography is a painless test that uses sound waves to create images of your heart. It provides your doctor with information about the size and shape of your heart and   how well your heart's chambers and valves are working. This procedure takes approximately one hour. There are no restrictions for this procedure.  Your physician has recommended that you have a pulmonary function test. Pulmonary Function Tests are a group of tests that measure how well air moves in and out of your lungs.   Follow-Up:  In 2-3 weeks with Dr. Kelly - we will have someone contact you to help set up this appointment.  If you need a refill on your cardiac medications before your next appointment, please call your pharmacy.      Signed, Hao Meng, PA  12/30/2016 7:32 AM    [facility] [address], [place], [place]  [address] Phone: [phone]; Fax: [phone]   

## 2016-12-30 ENCOUNTER — Encounter: Payer: Self-pay | Admitting: Physician Assistant

## 2016-12-30 NOTE — Telephone Encounter (Signed)
User: Cherie Dark A Date/time: 12/28/16 3:49 PM  Comment: Called pt and lmsg to see if his appt could be moved to 12/11 around the same time .   Context:  Outcome: Left Message  Phone number: [phone] Phone Type: Home Phone  Comm. type: Telephone Call type: Outgoing  Contact: Tobey Grim K Relation to patient: Self

## 2017-01-03 ENCOUNTER — Other Ambulatory Visit (HOSPITAL_COMMUNITY): Payer: PRIVATE HEALTH INSURANCE

## 2017-01-04 ENCOUNTER — Ambulatory Visit (HOSPITAL_COMMUNITY)
Admission: RE | Admit: 2017-01-04 | Discharge: 2017-01-04 | Disposition: A | Discharge status: Home / Self Care | Payer: PRIVATE HEALTH INSURANCE | Source: Ambulatory Visit | Attending: Physician Assistant | Admitting: Physician Assistant

## 2017-01-04 ENCOUNTER — Other Ambulatory Visit (HOSPITAL_COMMUNITY): Payer: PRIVATE HEALTH INSURANCE

## 2017-01-04 DIAGNOSIS — R0602 Shortness of breath: Secondary | ICD-10-CM | POA: Diagnosis present

## 2017-01-04 DIAGNOSIS — R942 Abnormal results of pulmonary function studies: Secondary | ICD-10-CM | POA: Diagnosis not present

## 2017-01-04 DIAGNOSIS — Z87891 Personal history of nicotine dependence: Secondary | ICD-10-CM | POA: Diagnosis not present

## 2017-01-04 LAB — PULMONARY FUNCTION TEST
DL/VA % pred: 104 %
DL/VA: 5.01 ml/min/mmHg/L
DLCO UNC: 26.45 ml/min/mmHg
DLCO unc % pred: 74 %
FEF 25-75 Post: 3.52 L/sec
FEF 25-75 Pre: 4 L/sec
FEF2575-%Change-Post: -12 %
FEF2575-%PRED-PRE: 102 %
FEF2575-%Pred-Post: 89 %
FEV1-%Change-Post: -1 %
FEV1-%PRED-POST: 70 %
FEV1-%PRED-PRE: 71 %
FEV1-Post: 3.07 L
FEV1-Pre: 3.1 L
FEV1FVC-%CHANGE-POST: 0 %
FEV1FVC-%Pred-Pre: 109 %
FEV6-%Change-Post: 0 %
FEV6-%PRED-PRE: 66 %
FEV6-%Pred-Post: 66 %
FEV6-POST: 3.56 L
FEV6-PRE: 3.59 L
FEV6FVC-%PRED-POST: 103 %
FEV6FVC-%PRED-PRE: 103 %
FVC-%Change-Post: 0 %
FVC-%Pred-Post: 63 %
FVC-%Pred-Pre: 64 %
FVC-POST: 3.56 L
FVC-Pre: 3.59 L
POST FEV6/FVC RATIO: 100 %
Post FEV1/FVC ratio: 86 %
Pre FEV1/FVC ratio: 86 %
Pre FEV6/FVC Ratio: 100 %
RV % pred: 75 %
RV: 1.55 L
TLC % PRED: 63 %
TLC: 4.66 L

## 2017-01-04 MED ORDER — ALBUTEROL SULFATE (2.5 MG/3ML) 0.083% IN NEBU
2.5000 mg | INHALATION_SOLUTION | Freq: Once | RESPIRATORY_TRACT | Status: AC
  Administered 2017-01-04: 2.5 mg via RESPIRATORY_TRACT

## 2017-01-06 ENCOUNTER — Other Ambulatory Visit: Payer: Self-pay

## 2017-01-06 ENCOUNTER — Ambulatory Visit (HOSPITAL_COMMUNITY): Payer: PRIVATE HEALTH INSURANCE | Attending: Cardiovascular Disease

## 2017-01-06 DIAGNOSIS — R0602 Shortness of breath: Secondary | ICD-10-CM | POA: Diagnosis present

## 2017-01-11 ENCOUNTER — Ambulatory Visit (INDEPENDENT_AMBULATORY_CARE_PROVIDER_SITE_OTHER): Payer: PRIVATE HEALTH INSURANCE | Admitting: Adult Health

## 2017-01-11 DIAGNOSIS — R400 Somnolence: Secondary | ICD-10-CM | POA: Diagnosis not present

## 2017-01-11 DIAGNOSIS — R0602 Shortness of breath: Secondary | ICD-10-CM | POA: Diagnosis not present

## 2017-01-11 DIAGNOSIS — R9389 Abnormal findings on diagnostic imaging of other specified body structures: Secondary | ICD-10-CM

## 2017-01-11 NOTE — Assessment & Plan Note (Signed)
Daytime sleepiness, snoring , obesity , underlying heart dz.  Set up for Sleep study .

## 2017-01-11 NOTE — Patient Instructions (Addendum)
Set up for HRCT Chest .  Set up for Split night sleep study .  GERD diet  Continue on Zantac Twice daily  .  Follow up with Dr. Elsworth Soho  In 4-6 weeks and As needed   Please contact office for sooner follow up if symptoms do not improve or worsen or seek emergency care

## 2017-01-11 NOTE — Progress Notes (Signed)
_0  ID: Richard Simpson, male    DOB: 04-22-1970, 46 y.o.   MRN: [id number]  Chief Complaint  Patient presents with  . 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  CAD s/p stent 2016.   TEST  Rpt echo 04/2015 >> nml LVEF  PFTs 12/2013 >>no obs, mild restriction, nml DLCO - ?effect of obesity   06/2013 NSTEMI, PTCA to LAD, EF 45% 06/25/2014 CT angio chest >> Patchy regions of ground-glass attenuation opacity 67m & 6cm in the medial aspect of the left upper lobe with associated volume loss 09/2014 CT chest >Decrease in size of ground-glass attenuating opacity within the perihilar left upper lobe compatible with a benign, likely infectious or inflammatory process.  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 Previously seen in past for dyspnea and abnormal CT chest . Last seen 04/2015. PFT in past showed mild restriction . Serial CT chest showed decreased size of GGO in LUL .  He has RA and previously on MTX, Hunmira and Remicade . Now on Humira.   Says he has been sob for last 2-3 years.aournd time of heart attack. Got some better after stents placed for heart attack for brief time but then DOE returned for last 2 years and is progressively worse. Associated chest tightess, takes nitro frequently.  Had echo last week, Gr 1 DD , EF 50-55%, severe hypokinesis of the apicalanteroseptal myocardium. No signifcant cough or wheezing .  Not able to walk long distance . Unable to do yard due to joint pain and dyspnea.  Prior to heart attack was able to exercise and work non stop.  Has gained  10lbs.  Has heartburn frequently , takes zantac Twice daily  .  No fever, abd pain, bloody stools, cough or wheezing .  PFT done last week , showed moderate restriction .  FEV1 70%, ratio 86, FVC 63%, DLCO 74%.  No significant bronchodilator response  Pt has daytime sleepiness, snoring and fatigue . Restless sleep .    Allergies  Allergen Reactions  . Lisinopril Cough  . Prednisone Other (See Comments)    Makes angry    Immunization History  Administered Date(s) Administered  . Influenza Split 10/25/2016  . Influenza,inj,Quad PF,6+ Mos 11/20/2014  . Influenza-Unspecified 12/09/2013    Past Medical History:  Diagnosis Date  . Anxiety   . CAD S/P percutaneous coronary angioplasty 10/2013   a. 10/2013 Cath: mod LAD dzs->nl FFR-> medically managed;  b. 06/2014 NSTEMI/PCi: LAD 99ost, 70p, 5108mentire area covered with a 3.5x20 Synergy DES), LCX nl, RCA 203m0d, EF 45-50%. c. 10/2015: cath showing patent LAD stent with 15% Prox LAD stenosis. EF 55-65%.   . [place], THORACIC SPINE 09/16/2009   Qualifier: Diagnosis of  By: HarAline Brochure, StaDorothyann Peng . Depression   . Dyslipidemia, goal LDL below 70   . Essential hypertension   . GERD (gastroesophageal reflux disease)   . Headache   . History of kidney stones   . Ischemic cardiomyopathy - Resolved 06/2014   a. 06/2014 EF 45-50%  by LV gram b. Echo July 2016: EF 60-65%. No RWMA. Gr 1 DD   . NSTEMI (non-ST elevated myocardial infarction) ([place]) 06/2014  . Obesity (BMI 30-39.9) 10/29/2013  . Primary snoring 10/29/2013  . Rheumatoid arthritis ([place]) 09/23/2013  . RLS (restless legs syndrome) 10/29/2013    Tobacco History: Social History   Tobacco Use  Smoking Status Former Smoker  . Packs/day: 0.25  . Years: 15.00  . Pack years: 3.75  . Types: Cigarettes  . Last attempt to quit: 09/04/2003  . Years since quitting: 13.3  Smokeless Tobacco Current User  . Types: Chew   Ready to quit: Not Answered Counseling given: Not  Answered   Outpatient Encounter Medications as of 01/11/2017  Medication Sig  . amLODipine (NORVASC) 5 MG tablet TAKE 1 TABLET BY MOUTH ONCE A DAY. (Patient taking differently: TAKE 1/2 TABLET BY MOUTH ONCE A DAY.)  . aspirin EC 81 MG tablet Take 1 tablet (81 mg total) by mouth daily.  Marland Kitchen atorvastatin (LIPITOR) 80 MG tablet TAKE ONE TABLET BY MOUTH ONCE DAILY.  . clonazePAM (KLONOPIN) 0.5 MG tablet Take 0.5 mg by mouth 2 (two) times daily as needed for anxiety (takes 1 time daily and 2 nd dose only if needed).   . clopidogrel (PLAVIX) 75 MG tablet TAKE 1 TABLET BY MOUTH ONCE A DAY.  . furosemide (LASIX) 20 MG tablet TAKE ONE TABLET BY MOUTH ONCE DAILY.  . InFLIXimab (REMICADE IV) Inject into the vein as directed. Every 6 weeks  . isosorbide mononitrate (IMDUR) 30 MG 24 hr tablet Take 1 tablet (30 mg total) by mouth daily.  Marland Kitchen loratadine (CLARITIN) 10 MG tablet Take 10 mg by mouth daily.   Marland Kitchen losartan (COZAAR) 25 MG tablet Take 25 mg  In the morning  . metoprolol tartrate (LOPRESSOR) 25 MG tablet TAKE (1) TABLET BY MOUTH TWICE DAILY.  . nitroGLYCERIN (NITROSTAT) 0.4 MG SL tablet DISSOLVE 1 TABLET UNDER TONGUE FOR CHEST TIGHTNESS AND SHORTNESS OF BREATH. MAY TAKE EVERY 5 MINUTES UP TO 3 DOSES. IF NO RELIEF AFTER 3 DOS  . ranitidine (ZANTAC) 150 MG capsule Take 150 mg by mouth 2 (two) times daily.   . traMADol (ULTRAM) 50 MG tablet Take 50 mg by mouth every 6 (six) hours as needed for moderate pain.   . [DISCONTINUED] peg 3350 powder (MOVIPREP) 100 g SOLR Take 1 kit (200 g total) by mouth as directed. (Patient not taking: Reported on 01/11/2017)   No facility-administered encounter medications on file as of 01/11/2017.      Review of Systems  Constitutional:   No  weight loss, night sweats,  Fevers, chills, fatigue, or  lassitude.  HEENT:   No headaches,  Difficulty swallowing,  Tooth/dental problems, or  Sore throat,                No sneezing, itching, ear ache,  +nasal congestion, post  nasal drip,   CV:  No chest pain,  Orthopnea, PND, swelling in lower extremities, anasarca, dizziness, palpitations, syncope.   GI  No heartburn, indigestion, abdominal pain, nausea, vomiting, diarrhea, change in bowel habits, loss of appetite, bloody stools.   Resp:  .  No chest wall deformity  Skin: no rash or lesions.  GU: no dysuria, change in color of urine, no urgency or frequency.  No flank pain, no hematuria   MS:  No joint pain or swelling.  No decreased range of motion.  No back pain.    Physical Exam  BP 134/80 (BP  Location: Left Arm, Cuff Size: Large)   Pulse 75   Ht _0  (1.854 m)   Wt 273 lb 12.8 oz (124.2 kg)   SpO2 94%   BMI 36.12 kg/m   GEN: A/Ox3; pleasant , NAD, obese    HEENT:  [place]/AT,  EACs-clear, TMs-wnl, NOSE-clear, THROAT-clear, no lesions, no postnasal drip or exudate noted. Class 3 MP airway   NECK:  Supple w/ fair ROM; no JVD; normal carotid impulses w/o bruits; no thyromegaly or nodules palpated; no lymphadenopathy.    RESP  Clear  P & A; w/o, wheezes/ rales/ or rhonchi. no accessory muscle use, no dullness to percussion  CARD:  RRR, no m/r/g, no peripheral edema, pulses intact, no cyanosis or clubbing.  GI:   Soft & nt; nml bowel sounds; no organomegaly or masses detected.   Musco: Warm bil, no deformities or joint swelling noted.   Neuro: alert, no focal deficits noted.    Skin: Warm, no lesions or rashes    Lab Results:  CBC    Component Value Date/Time   WBC 4.4 06/09/2016 1357   RBC 4.94 06/09/2016 1357   HGB 16.0 06/09/2016 1357   HGB 15.8 08/06/2014 0746   HCT 45.1 06/09/2016 1357   HCT 46.1 08/06/2014 0746   PLT 179 06/09/2016 1357   PLT 168 08/06/2014 0746   MCV 91.3 06/09/2016 1357   MCV 94 08/06/2014 0746   MCH 32.4 06/09/2016 1357   MCHC 35.5 06/09/2016 1357   RDW 12.7 06/09/2016 1357   RDW 12.8 08/06/2014 0746   LYMPHSABS 1.5 06/09/2016 1357   LYMPHSABS 1.2 08/06/2014 0746   MONOABS 0.6 06/09/2016 1357    EOSABS 0.0 06/09/2016 1357   EOSABS 0.1 08/06/2014 0746   BASOSABS 0.0 06/09/2016 1357   BASOSABS 0.0 08/06/2014 0746    BMET    Component Value Date/Time   NA 135 06/09/2016 1357   NA 143 08/06/2014 0746   K 3.7 06/09/2016 1357   CL 106 06/09/2016 1357   CO2 21 (L) 06/09/2016 1357   GLUCOSE 91 06/09/2016 1357   BUN 19 06/09/2016 1357   BUN 14 08/06/2014 0746   CREATININE 0.89 06/09/2016 1357   CREATININE 1.04 11/11/2015 1607   CALCIUM 9.0 06/09/2016 1357   GFRNONAA >60 06/09/2016 1357   GFRNONAA 87 11/11/2015 1607   GFRAA >60 06/09/2016 1357   GFRAA >89 11/11/2015 1607    BNP No results found for: BNP  ProBNP    Component Value Date/Time   PROBNP 18.0 03/19/2015 1034    Imaging: No results found.   Assessment & Plan:   Abnormal CT scan, chest Previous LUL GGO with decrease in size on serial CT but not total resolution  Restriction on PFT along with RA  Will check HRCT chest to look for abnormalities , possible ILD .(although exam is unrevealing w/ no crackles) and nml O2 sats. >)   Dyspnea Chronic Dyspnea w/ exertion - questionable etiology . Pt has multiple co-morbidites with RA, CAD/D CHF , multiple medications and obesity along with deconditioning . Which may all be contributing to his dyspnea.  There is some restriction on PFT . Will check HRCT Chest to r/o ILD changes or other abn.  Restriction may be secondary to morbid obesity .  If HRCT chest is ok. May need CPST to better look at issue.    Daytime sleepiness Daytime sleepiness, snoring , obesity , underlying heart dz.  Set up for Sleep study .  Rexene Edison, NP 01/11/2017

## 2017-01-11 NOTE — Assessment & Plan Note (Signed)
Chronic Dyspnea w/ exertion - questionable etiology . Pt has multiple co-morbidites with RA, CAD/D CHF , multiple medications and obesity along with deconditioning . Which may all be contributing to his dyspnea.  There is some restriction on PFT . Will check HRCT Chest to r/o ILD changes or other abn.  Restriction may be secondary to morbid obesity .  If HRCT chest is ok. May need CPST to better look at issue.

## 2017-01-11 NOTE — Assessment & Plan Note (Signed)
Previous LUL GGO with decrease in size on serial CT but not total resolution  Restriction on PFT along with RA  Will check HRCT chest to look for abnormalities , possible ILD .(although exam is unrevealing w/ no crackles) and nml O2 sats. >)

## 2017-01-13 ENCOUNTER — Encounter: Payer: Self-pay | Admitting: Cardiovascular Disease

## 2017-01-13 ENCOUNTER — Ambulatory Visit (INDEPENDENT_AMBULATORY_CARE_PROVIDER_SITE_OTHER): Payer: PRIVATE HEALTH INSURANCE | Admitting: Cardiovascular Disease

## 2017-01-13 VITALS — BP 104/80 | HR 80 | Ht 73.0 in | Wt 275.0 lb

## 2017-01-13 DIAGNOSIS — Z9861 Coronary angioplasty status: Secondary | ICD-10-CM

## 2017-01-13 DIAGNOSIS — R0609 Other forms of dyspnea: Secondary | ICD-10-CM

## 2017-01-13 DIAGNOSIS — J984 Other disorders of lung: Secondary | ICD-10-CM | POA: Diagnosis not present

## 2017-01-13 DIAGNOSIS — I10 Essential (primary) hypertension: Secondary | ICD-10-CM | POA: Diagnosis not present

## 2017-01-13 DIAGNOSIS — E785 Hyperlipidemia, unspecified: Secondary | ICD-10-CM

## 2017-01-13 DIAGNOSIS — I2510 Atherosclerotic heart disease of native coronary artery without angina pectoris: Secondary | ICD-10-CM

## 2017-01-13 DIAGNOSIS — M069 Rheumatoid arthritis, unspecified: Secondary | ICD-10-CM

## 2017-01-13 DIAGNOSIS — R0600 Dyspnea, unspecified: Secondary | ICD-10-CM

## 2017-01-13 DIAGNOSIS — I209 Angina pectoris, unspecified: Secondary | ICD-10-CM

## 2017-01-13 DIAGNOSIS — E669 Obesity, unspecified: Secondary | ICD-10-CM | POA: Diagnosis not present

## 2017-01-13 NOTE — Progress Notes (Signed)
Richard Simpson ID: Richard Simpson, male   DOB: 08/16/1970, 46 y.o.   MRN: [id number]     HPI: Richard Simpson is a 46 y.o. male who presents to the office today for a 20 month follow-up cardiology evaluation.  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 Richard Simpson developed severe chest pain the evening of July 04, 2014 leading to his [facility] hospitalization on the morning of June 10.  Richard Simpson was seen in consultation by Dr. Harl Bowie and transferred for urgent cardiac catheterization which was done by me. This revealed a subtotal 99+ percent ostial LAD stenosis followed by proximal 70% and 50% stenoses.  Initial ejection fraction was 45% with anterolateral hypocontractility.  Richard Simpson underwent successful intervention with insertion of Synergy DES 3.520 mm stent placed from the LAD ostium to cover all 3 lesions.  Subsequently, Richard Simpson noticed marked benefit in his previous exertional shortness of breath symptomatology.  Richard Simpson was seen  In follow-up by Murray Hodgkins, NP and due to and ACE-induced cough was started on losartan in place of lisinopril. Richard Simpson has a history of arthritic disease and in the past had been treated for probable psoriatic arthritis but Richard Simpson is now felt to have rheumatoid arthritis.  An echo Doppler study on 08/09/2014 which mild focal basal hypertrophy of the septum.  There are no wall motion abnormalities.  Ejection fraction was 60-65%.  There was grade 1 diastolic dysfunction.  The mitral valve was mildly calcified at its annulus and there was trivial aortic insufficiency.  Richard Simpson participated in cardiac rehabilitation last year.  Richard Simpson developed recurrent episodes of chest tightness and underwent repeat cardiac catheterization on 03/24/2015 by Dr. Ellyn Hack which showed a widely patent stent at the ostium and proximal LAD segment.  There was mild 35% narrowing after the takeoff of the first diagonal vessel.  His circumflex and RCA  were considered to be normal.  His LVEDP was mildly increased.  It was felt that there was no new evidence for a new CAD lesion to explain his symptoms.  It was felt that possibly his symptoms were related to microvascular disease versus diastolic dysfunction.  Subsequently, Richard Simpson was started on Lasix 20 mg.  Richard Simpson also has been taking amlodipine 5 mg, losartan 25 mg, metoprolol tartrate 25 mg twice a day.  Richard Simpson continues to be on aspirin 81 mg and Brilinta 90 mg twice a day.  His wife was very concerned about this 35% LAD stenosis.   When I saw him in March 2017, I titrated his losartan from 25 mg to 37.5 mg and recommended addition of Zetia to his 80 mg atorvastatin in attempt to induce further plaque regression.  Richard Simpson underwent an echo Doppler study on 04/28/2015 which showed an ejection fraction at 60-65%.  Richard Simpson did not have any regional wall motion abnormalities.  Richard Simpson had mild left atrial dilatation.  Richard Simpson was felt to have normal diastolic function.  On subsequent evaluation Richard Simpson continued, Richard Simpson has continued to experience episodes of chest discomfort which Richard Simpson believes occurs with activity.  Due to potential microvascular component to symptomatology I added nitrate therapy.  I referred him for a cardiopulmonary met test which did not reveal significant cardiac or pulmonary limitation.    Richard Simpson was later seen by Rosaria Ferries, PAC, and because of increasing recurrent chest pain.  Richard Simpson underwent repeat cardiac catheterization by me 11/20/2015.  This did not demonstrate any residual CAD and had a widely  patent LAD stent extending from the ostium ending prior to the takeoff of the first diagonal vessel.  There was mild 15% narrowing in the LAD after diagonal vessel which was improved from previous evaluation.  His circumflex, intermediate, and dominant RCA vessels were normal.   I have not seen the Richard Simpson since his last cardiac catheterization.  Apparently Richard Simpson was seen in June 2018 by Melvyn Neth. Marland Kitchen  Richard Simpson was recently seen by  Almyra Deforest on 12/28/2016.  His isosorbide was increased to 30 mg and Richard Simpson has noticed improvement and has not taken subungual nitroglycerin.  Richard Simpson's gotten short of breath with hunting.  Richard Simpson referred him for an echo Doppler study on 01/06/2017, which showed an EF 50-55% with anteroapical, septal hypokinesis and grade 1 diastolic dysfunction.  Richard Simpson has experienced shortness of breath with hunting.  Richard Simpson also underwent a pulmonary evaluation and reportedly had restriction on pulmonary function testing. Marland Kitchen  Richard Simpson is scheduled to undergo a high-resolution CT scan.  Richard Simpson has rheumatoid arthritis.  Richard Simpson has been scheduled for a sleep study particular with his daytime sleepiness, snoring, obesity, and underlying heart disease.  Richard Simpson presents to my office today for evaluation.   Past Medical History:  Diagnosis Date  . Anxiety   . CAD S/P percutaneous coronary angioplasty 10/2013   a. 10/2013 Cath: mod LAD dzs->nl FFR-> medically managed;  b. 06/2014 NSTEMI/PCi: LAD 99ost, 70p, 87m(entire area covered with a 3.5x20 Synergy DES), LCX nl, RCA 274m10d, EF 45-50%. c. 10/2015: cath showing patent LAD stent with 15% Prox LAD stenosis. EF 55-65%.   . 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 Depression   . Dyslipidemia, goal LDL below 70   . Essential hypertension   . GERD (gastroesophageal reflux disease)   . Headache   . History of kidney stones   . Ischemic cardiomyopathy - Resolved 06/2014   a. 06/2014 EF 45-50% by LV gram b. Echo July 2016: EF 60-65%. No RWMA. Gr 1 DD   . NSTEMI (non-ST elevated myocardial infarction) ([facility]/[place]  . Obesity (BMI 30-39.9) 10/29/2013  . Primary snoring 10/29/2013  . Rheumatoid arthritis ([facility]/30/2015  . RLS (restless legs syndrome) 10/29/2013    Past Surgical History:  Procedure Laterality Date  . CARDIAC CATHETERIZATION N/A 07/05/2014   Procedure: Left Heart Cath and Coronary Angiography;  Surgeon: ThTroy SineMD;   [facility];  Service: Cardiovascular;  NSTEMI --> Ostial LAd 99%, prox 80% & early mid 50% --> PCI  . CARDIAC CATHETERIZATION  07/05/2014   Procedure: Coronary Stent Intervention;  Surgeon: ThTroy SineMD;  [facility];  Service: Cardiovascular; Ostial-prox LAD tandem 99, 80 & 50% --> Synergy DES 3.5 x 20 (3.74)  . CARDIAC CATHETERIZATION N/A 03/24/2015   Procedure: Left Heart Cath and Coronary Angiography;  Surgeon: DaLeonie ManMD;  [facility];  Service: Cardiovascular;  Laterality: N/A;  . CARDIAC CATHETERIZATION N/A 11/20/2015   Procedure: Left Heart Cath and Coronary Angiography;  Surgeon: ThTroy SineMD;  [facility];  Service: Cardiovascular;  Laterality: N/A;  . CHOLECYSTECTOMY     10 yrs ago-jenkins  . COLONOSCOPY N/A 03/23/2013   Procedure: COLONOSCOPY;  Surgeon: RoDaneil DolinMD;  [facility];  Service: Endoscopy;  Laterality: N/A;  2:15-moved to 1030 Staff notified pt  . COLONOSCOPY WITH PROPOFOL N/A 06/10/2016  Procedure: COLONOSCOPY WITH PROPOFOL;  Surgeon: Daneil Dolin, MD;  [facility];  Service: Endoscopy;  Laterality: N/A;  730   . FRACTIONAL FLOW RESERVE WIRE  11/23/2013   Procedure: FRACTIONAL FLOW RESERVE WIRE;  Surgeon: Jettie Booze, MD;  Location: [facility] C[facility] LAB;  Service: Cardiovascular;;  . GALLBLADDER SURGERY    . LEFT HEART CATHETERIZATION WITH CORONARY ANGIOGRAM N/A 11/23/2013   Procedure: LEFT HEART CATHETERIZATION WITH CORONARY ANGIOGRAM;  Surgeon: Jettie Booze, MD;  Location: [facility] CATH LAB;  Service: Cardiovascular;  Laterality: N/A;  . MASS EXCISION  09/09/2010   Procedure: EXCISION MASS;  Surgeon: Arther Abbott, MD;  Location: [facility];  Service: Orthopedics;  Laterality: Left;  Excision Mass Left Long Finger  . POLYPECTOMY  06/10/2016   Procedure: POLYPECTOMY;  Surgeon: Daneil Dolin, MD;  Location[facility];  Service: Endoscopy;;  colon    . THROAT SURGERY     nodules removed from throat as child  . TRANSTHORACIC ECHOCARDIOGRAM   July 2016:    EF 60-65%. No RWMA. Gr 1 DD    Allergies  Allergen Reactions  . Lisinopril Cough  . Prednisone Other (See Comments)    Makes angry    Current Outpatient Medications  Medication Sig Dispense Refill  . amLODipine (NORVASC) 5 MG tablet TAKE 1 TABLET BY MOUTH ONCE A DAY. (Richard Simpson taking differently: TAKE 1/2 TABLET BY MOUTH ONCE A DAY.) 30 tablet 5  . aspirin EC 81 MG tablet Take 1 tablet (81 mg total) by mouth daily. 30 tablet 3  . atorvastatin (LIPITOR) 80 MG tablet TAKE ONE TABLET BY MOUTH ONCE DAILY. 30 tablet 5  . clonazePAM (KLONOPIN) 0.5 MG tablet Take 0.5 mg by mouth 2 (two) times daily as needed for anxiety (takes 1 time daily and 2 nd dose only if needed).     . clopidogrel (PLAVIX) 75 MG tablet TAKE 1 TABLET BY MOUTH ONCE A DAY. 30 tablet 0  . furosemide (LASIX) 20 MG tablet TAKE ONE TABLET BY MOUTH ONCE DAILY. 30 tablet 0  . InFLIXimab (REMICADE IV) Inject into the vein as directed. Every 6 weeks    . isosorbide mononitrate (IMDUR) 30 MG 24 hr tablet Take 1 tablet (30 mg total) by mouth daily. 90 tablet 1  . loratadine (CLARITIN) 10 MG tablet Take 10 mg by mouth daily.     Marland Kitchen losartan (COZAAR) 25 MG tablet Take 25 mg  In the morning 135 tablet 3  . metoprolol tartrate (LOPRESSOR) 25 MG tablet TAKE (1) TABLET BY MOUTH TWICE DAILY. 60 tablet 5  . nitroGLYCERIN (NITROSTAT) 0.4 MG SL tablet DISSOLVE 1 TABLET UNDER TONGUE FOR CHEST TIGHTNESS AND SHORTNESS OF BREATH. MAY TAKE EVERY 5 MINUTES UP TO 3 DOSES. IF NO RELIEF AFTER 3 DOS 25 tablet 0  . ranitidine (ZANTAC) 150 MG capsule Take 150 mg by mouth 2 (two) times daily.     . traMADol (ULTRAM) 50 MG tablet Take 50 mg by mouth every 6 (six) hours as needed for moderate pain.      No current facility-administered medications for this visit.     Social History   Socioeconomic History  . Marital status: Married    Spouse  name: [name]  . Number of children: 2  . Years of education: 12th grade  . Highest education level: Not on file  Social Needs  . Financial resource strain: Not on file  . Food insecurity - worry: Not on file  . Food insecurity -  inability: Not on file  . Transportation needs - medical: Not on file  . Transportation needs - non-medical: Not on file  Occupational History  . Occupation: Retired Chemical engineer: [employer]  Tobacco Use  . Smoking status: Former Smoker    Packs/day: 0.25    Years: 15.00    Pack years: 3.75    Types: Cigarettes    Last attempt to quit: 09/04/2003    Years since quitting: 13.3  . Smokeless tobacco: Current User    Types: Chew  Substance and Sexual Activity  . Alcohol use: Yes    Alcohol/week: 0.0 oz    Types: 1 - 2 Cans of beer per week    Comment: rare  . Drug use: No  . Sexual activity: Yes  Other Topics Concern  . Not on file  Social History Narrative   Richard Simpson lives at home with his wife [name]).    Richard Simpson works full time Dentist..   Education high school.   Right handed.   Caffeine one cup of coffee daily and one soda.    Family History  Problem Relation Age of Onset  . Hypertension Mother   . Diabetes Mother   . Arthritis Paternal Uncle   . Cancer Maternal Grandfather   . Hypertension Maternal Grandmother   . Diabetes Maternal Grandmother   . Diabetes Unknown   . Diabetes Unknown   . Diabetes Paternal Grandfather   . Anesthesia problems Neg Hx   . Hypotension Neg Hx   . Malignant hyperthermia Neg Hx   . Pseudochol deficiency Neg Hx   . Heart attack Neg Hx   . Stroke Neg Hx     ROS General: Negative; No fevers, chills, or night sweats HEENT: Negative; No changes in vision or hearing, sinus congestion, difficulty swallowing Pulmonary: Negative; No cough, wheezing, shortness of breath, hemoptysis Cardiovascular: See HPI: No chest pain, presyncope, syncope, palpatations GI: Negative; No  nausea, vomiting, diarrhea, or abdominal pain GU: Negative; No dysuria, hematuria, or difficulty voiding Musculoskeletal: Negative; no myalgias, joint pain, or weakness Hematologic: Negative; no easy bruising, bleeding Endocrine: Negative; no heat/cold intolerance; no diabetes, Neuro: Negative; no changes in balance, headaches Skin: Negative; No rashes or skin lesions Psychiatric: Negative; No behavioral problems, depression Sleep: Negative; No snoring,  daytime sleepiness, hypersomnolence, bruxism, restless legs, hypnogognic hallucinations. Other comprehensive 14 point system review is negative   Physical Exam BP 104/80   Pulse 80   Ht '6\' 1"'  (1.854 m)   Wt 275 lb (124.7 kg)   BMI 36.28 kg/m    Repeat blood pressure by me was 105/80  Wt Readings from Last 3 Encounters:  01/13/17 275 lb (124.7 kg)  01/11/17 273 lb 12.8 oz (124.2 kg)  12/28/16 269 lb 3.2 oz (122.1 kg)   General: Alert, oriented, no distress.  Skin: normal turgor, no rashes, warm and dry HEENT: Normocephalic, atraumatic. Pupils equal round and reactive to light; sclera anicteric; extraocular muscles intact;  Nose without nasal septal hypertrophy Mouth/Parynx benign; Mallinpatti scale 3 Neck: Thick neck; No JVD, no carotid bruits; normal carotid upstroke Lungs: clear to ausculatation and percussion; no wheezing or rales Chest wall: without tenderness to palpitation Heart: PMI not displaced, RRR, s1 s2 normal, 1/6 systolic murmur, no diastolic murmur, no rubs, gallops, thrills, or heaves Abdomen: soft, nontender; no hepatosplenomehaly, BS+; abdominal aorta nontender and not dilated by palpation. Back: no CVA tenderness Pulses 2+ Musculoskeletal: full range of motion, normal strength, no joint  deformities Extremities: no clubbing cyanosis or edema, Homan's sign negative  Neurologic: grossly nonfocal; Cranial nerves grossly wnl Psychologic: Normal mood and affect    General: Alert, oriented, no distress.   Skin: normal turgor, no rashes, warm and dry HEENT: Normocephalic, atraumatic. Pupils equal round and reactive to light; sclera anicteric; extraocular muscles intact, No lid lag; Nose without nasal septal hypertrophy; Mouth/Parynx benign; Mallinpatti scale 3 Neck:  Thick neck;No JVD, no carotid bruits; normal carotid upstroke Lungs: clear to ausculatation and percussion bilaterally; no wheezing or rales, normal inspiratory and expiratory effort Chest wall: without tenderness to palpitation Heart: PMI not displaced, RRR, s1 s2 normal,  Faint 1/6 systolic murmur, No diastolic murmur, no rubs, gallops, thrills, or heaves Abdomen: Moderate central adiposity; soft, nontender; no hepatosplenomehaly, BS+; abdominal aorta nontender and not dilated by palpation. Back: no CVA tenderness Pulses: 2+  Musculoskeletal: full range of motion, normal strength, no joint deformities Extremities: Pulses 2+, no clubbing cyanosis or edema, Homan's sign negative  Neurologic: grossly nonfocal; Cranial nerves grossly wnl Psychologic: Normal mood and affect  ECG (independently read by me):  Normal sinus hythm at 80 bpm.  QTc terval 440 ms.  PR intrval 126 ms. nodiagnostic T changes in leads 3 an aVF.  June 2017 ECG (independently read by me): Sinus bradycardia 57 bpm.  Normal intervals..  She noted T-wave changes in lead 3.  March 2017 ECG (independently read by me): Normal sinus rhythm at 69 bpm.  No ectopy.  QTc interval 424 ms  October 2016 ECG (independently read by me): Normal sinus rhythm at 65 bpm.  Nondiagnostic T changes.  No ECG evidence of infarction.  July 2016 ECG (independently read by me):  Normal sinus rhythm at 65 bpm.  Nonspecific T-wave abnormality.  LABS:  BMP Latest Ref Rng & Units 06/09/2016 11/11/2015 04/16/2015  Glucose 65 - 99 mg/dL 91 88 98  BUN 6 - 20 mg/dL '19 21 16  ' Creatinine 0.61 - 1.24 mg/dL 0.89 1.04 0.87  BUN/Creat Ratio 9 - 20 - - -  Sodium 135 - 145 mmol/L 135 140 139   Potassium 3.5 - 5.1 mmol/L 3.7 4.3 4.1  Chloride 101 - 111 mmol/L 106 105 109  CO2 22 - 32 mmol/L 21(L) 25 25  Calcium 8.9 - 10.3 mg/dL 9.0 9.3 8.7(L)    Hepatic Function Latest Ref Rng & Units 04/16/2015 08/06/2014 07/04/2014  Total Protein 6.5 - 8.1 g/dL 7.0 6.7 6.8  Albumin 3.5 - 5.0 g/dL 4.2 4.3 4.2  AST 15 - 41 U/L 31 36 35  ALT 17 - 63 U/L 40 51(H) 51  Alk Phosphatase 38 - 126 U/L 69 82 55  Total Bilirubin 0.3 - 1.2 mg/dL 1.2 0.9 1.0  Bilirubin, Direct 0.0 - 0.3 mg/dL - - -    CBC Latest Ref Rng & Units 06/09/2016 11/11/2015 04/16/2015  WBC 4.0 - 10.5 K/uL 4.4 6.4 4.0  Hemoglobin 13.0 - 17.0 g/dL 16.0 16.1 15.8  Hematocrit 39.0 - 52.0 % 45.1 46.1 44.3  Platelets 150 - 400 K/uL 179 186 149(L)   Lab Results  Component Value Date   MCV 91.3 06/09/2016   MCV 93.7 11/11/2015   MCV 90.0 04/16/2015    Lab Results  Component Value Date   TSH 0.707 04/16/2015    BNP No results found for: BNP  ProBNP    Component Value Date/Time   PROBNP 18.0 03/19/2015 1034     Lipid Panel     Component Value Date/Time   CHOL 103  04/16/2015 0820   CHOL 114 08/06/2014 0746   TRIG 70 04/16/2015 0820   HDL 28 (L) 04/16/2015 0820   HDL 28 (L) 08/06/2014 0746   CHOLHDL 3.7 04/16/2015 0820   VLDL 14 04/16/2015 0820   LDLCALC 61 04/16/2015 0820   LDLCALC 63 08/06/2014 0746     RADIOLOGY: No results found.  IMPRESSION:  1. Essential hypertension   2. Angina, class III ([place])   3. CAD S/P percutaneous coronary angioplasty   4. DOE (dyspnea on exertion)   5. Restrictive lung disease   6. Hyperlipidemia LDL goal <70   7. Rheumatoid arthritis, involving unspecified site, unspecified rheumatoid factor presence ([place])   8. 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  Richard Simpson presented with an acute coronary syndrome  and ruled in for non-ST segment elevation MI with a peak troponin of 18.22 on 07/05/2014 and underwent successful PCI involving a subtotal greater than 99% ostial LAD stenosis with ultimate insertion of a 3.520 mm [employer]'s Synergy DES stent which covered 3 LAD lesions. His ECG  does not show any evidence for his prior MI.  An echo Doppler study on 08/09/2014 showed an EF of 60-65% with grade 1 diastolic dysfunction.  There was mild mitral annular calcification and trivial aortic insufficiency.  Richard Simpson underwent repeat catheterization in February 2017 due to recurrent episodes of chest pain.  This revealed a widely patent stent and only mild nonobstructive 35% LAD stenosis.  Richard Simpson has been on increasing doses of losartan.  A follow-up echo Doppler study done in April 2017 showed normal systolic as well as normal diastolic function without previously noted grade 1 diastolic dysfunction on ARB therapy. Marland Kitchen  Richard Simpson continues to have chest pain symptomatology and apparently had an unremarkable cardiopulmonary met test.  Due to recurrent continued symptomatology.  His last cardiac catheterization was done in October 2017 which again showed a widely patent LAD stent.  At that time continued medical therapy was recommended with consideration for possible noncardiac etiology to his nitrate responsive chest pain.  I had not seen him since his October 2017 catheterization study.  Richard Simpson again has had recurrent symptomatology which have improved with slight titration of isosorbide.  Richard Simpson was felt to have restrictive physiology on PFTs and is undergoing a high resolution chest CT in the near future.  Richard Simpson also has a high likelihood for obstructive sleep apnea and is scheduled to undergo a sleep study at [facility] sleep lab.  Richard Simpson will be seeing Dr. Elsworth Soho in follow-up.  Richard Simpson is obese.  We discussed the importance of weight loss and improved diet.  I suspect Richard Simpson is also aerobically deconditioned which may be contributing to some of his  exertional dyspnea.  His chest pain has improved with nitrate therapy.  Richard Simpson continues to be on atorvastatin 80 mg for hyperlipidemia with target LDL less than 70.  Blood pressure today is stable on amlodipine 5 mg in addition to metoprolol 25 mg twice a day and low-dose losartan 25 mg.  Richard Simpson continues to be on aspirin and Plavix for dual antiplatelet therapy.  Richard Simpson is on Remicade for his rheumatoid arthritis.  I will see him in 3-4 months for reevaluation.   Time spent: 25 minutes Troy Sine, MD, [facility]  01/15/2017 10:06 AM

## 2017-01-13 NOTE — Patient Instructions (Addendum)
Your physician recommends that you continue on your current medications as directed. Please refer to the Current Medication list given to you today.  Your physician recommends that you schedule a follow-up appointment in: 4 MONTHS WITH DR KELLY  

## 2017-01-15 ENCOUNTER — Encounter: Payer: Self-pay | Admitting: Cardiovascular Disease

## 2017-01-19 ENCOUNTER — Ambulatory Visit (HOSPITAL_COMMUNITY)
Admission: RE | Admit: 2017-01-19 | Discharge: 2017-01-19 | Disposition: A | Discharge status: Home / Self Care | Payer: PRIVATE HEALTH INSURANCE | Source: Ambulatory Visit | Attending: Adult Health | Admitting: Adult Health

## 2017-01-19 DIAGNOSIS — K760 Fatty (change of) liver, not elsewhere classified: Secondary | ICD-10-CM | POA: Diagnosis not present

## 2017-01-19 DIAGNOSIS — I2510 Atherosclerotic heart disease of native coronary artery without angina pectoris: Secondary | ICD-10-CM | POA: Insufficient documentation

## 2017-01-19 DIAGNOSIS — R0602 Shortness of breath: Secondary | ICD-10-CM | POA: Diagnosis present

## 2017-01-19 DIAGNOSIS — I700 Atherosclerosis of aorta: Secondary | ICD-10-CM | POA: Insufficient documentation

## 2017-01-20 IMAGING — CT CT CHEST W/O CM
2 of 3 series · 14 of 36 positions shown, 17 images · non-contrast
Comparison: 06/25/2014

CLINICAL DATA: Followup lung nodules

EXAM:
CT CHEST WITHOUT CONTRAST
TECHNIQUE: Multidetector CT imaging of the chest was performed following the
standard protocol without IV contrast.

[Series 2: chestroutine 5.0 b40f · axial · 0.79mm/px · z∈[-285,-55]mm · 11 of 54 slices shown, 14 images]
[im 4/54  mediastinal]
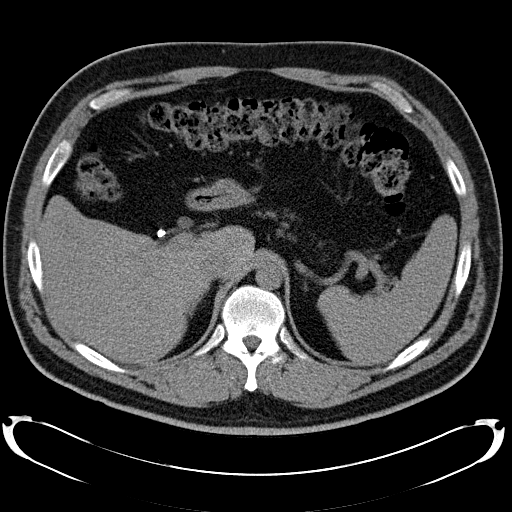
[im 4/54  lung]
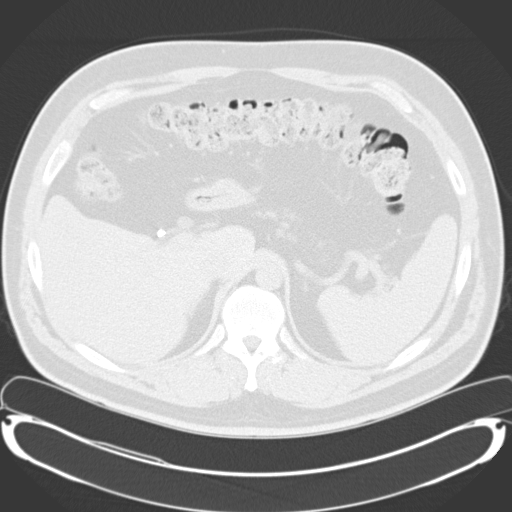
[im 8/54  lung]
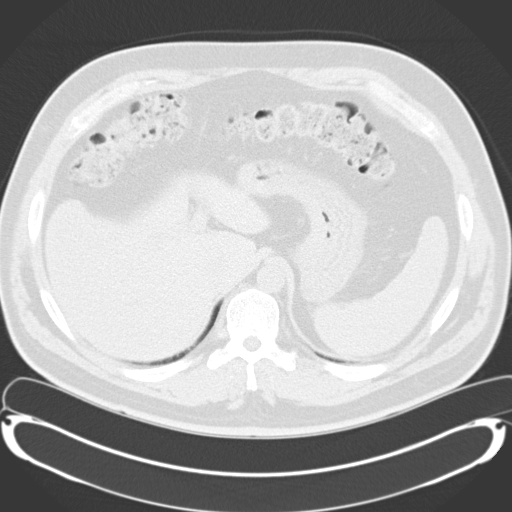
[im 12/54  lung]
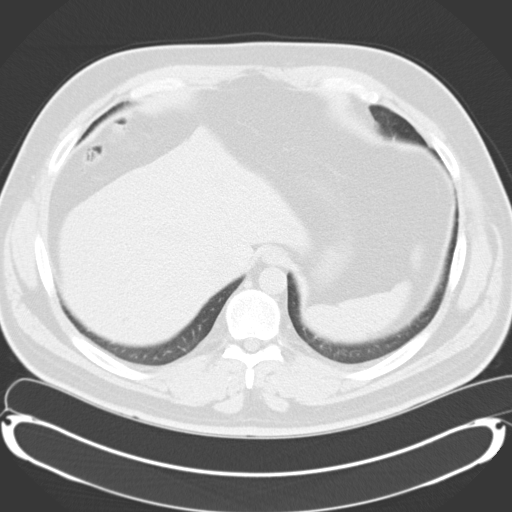
[im 18/54  lung]
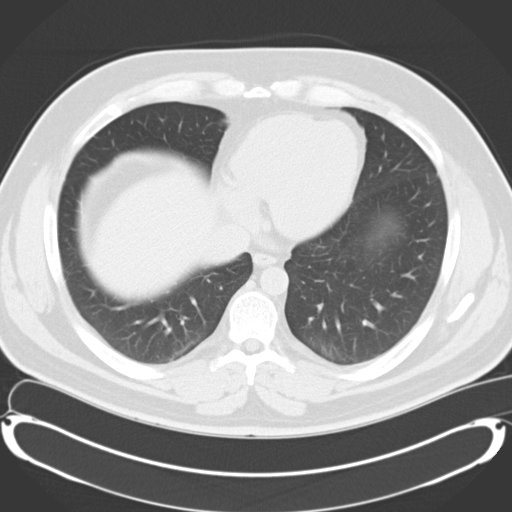
[im 22/54  mediastinal]
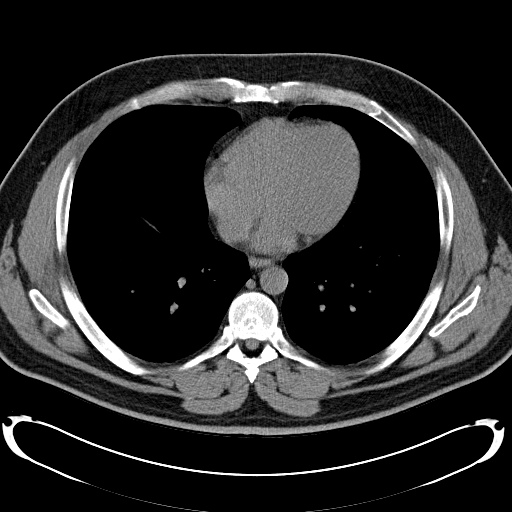
[im 22/54  lung]
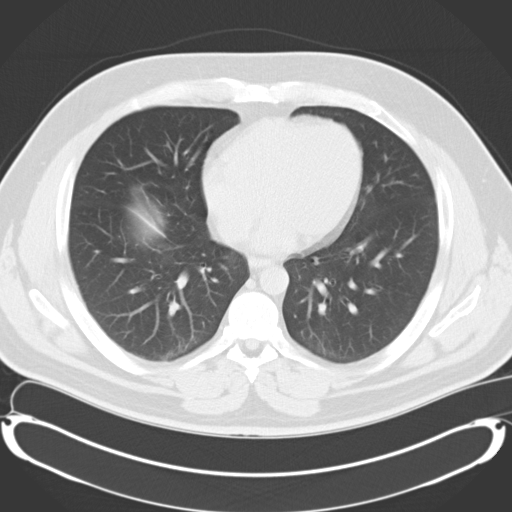
[im 28/54  lung]
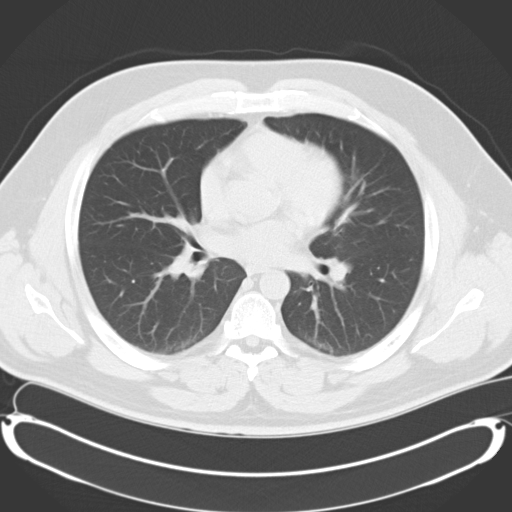
[im 32/54  lung]
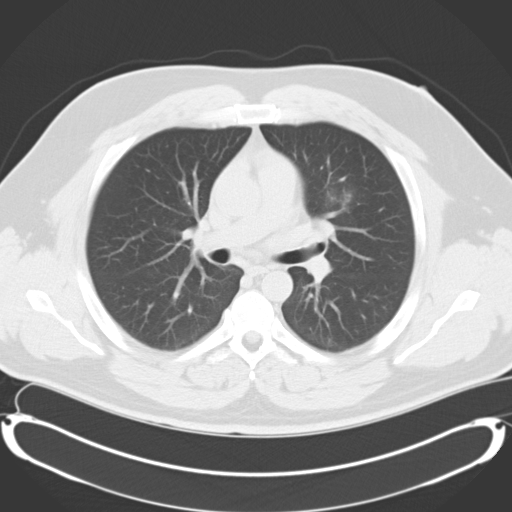
[im 36/54  lung]
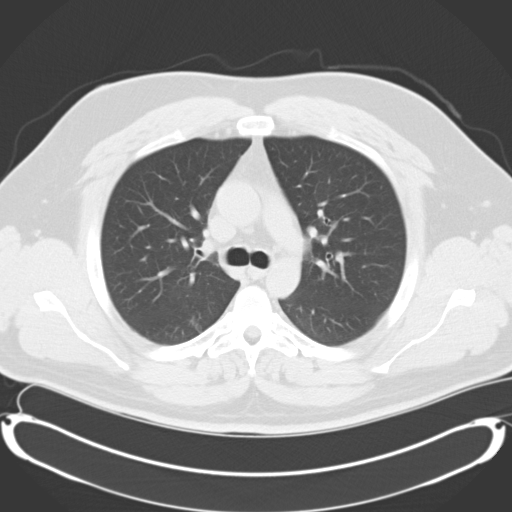
[im 42/54  mediastinal]
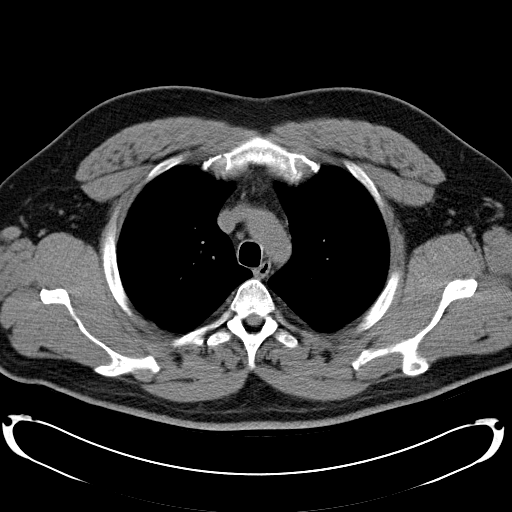
[im 42/54  lung]
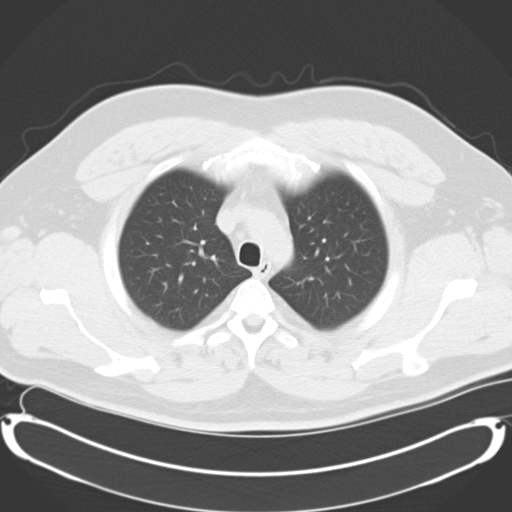
[im 46/54  lung]
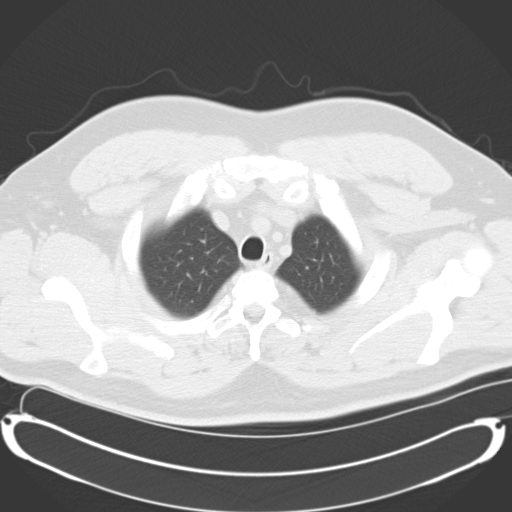
[im 50/54  lung]
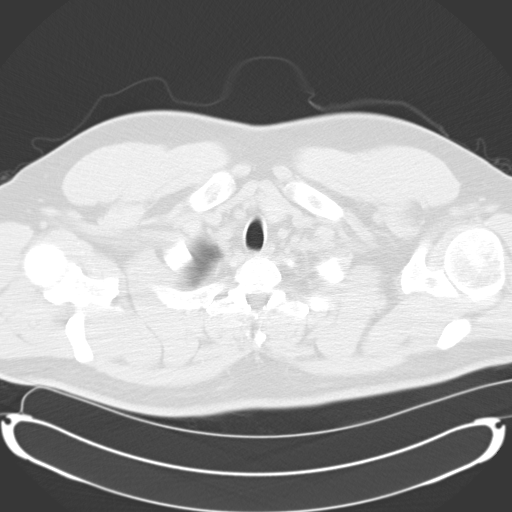

[Series 4: mpr coro 3mm · coronal · 0.54mm/px · 3 of 87 slices shown]
[im 18/87  lung]
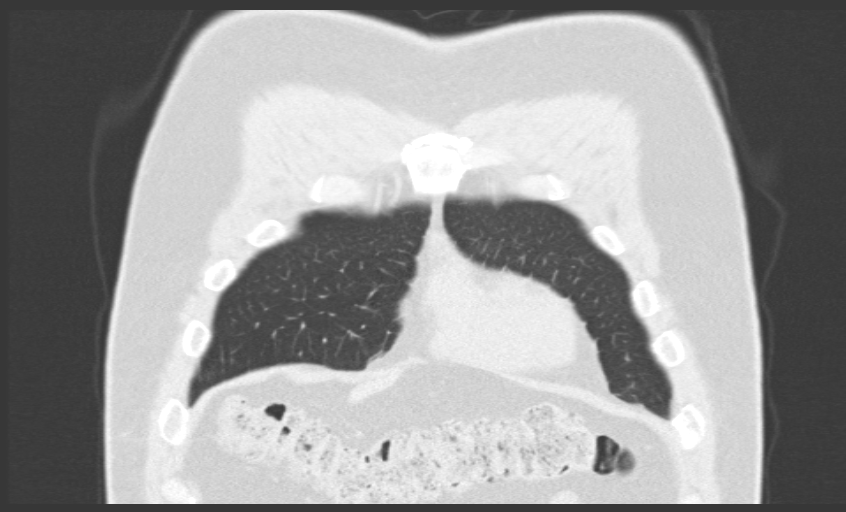
[im 35/87  lung]
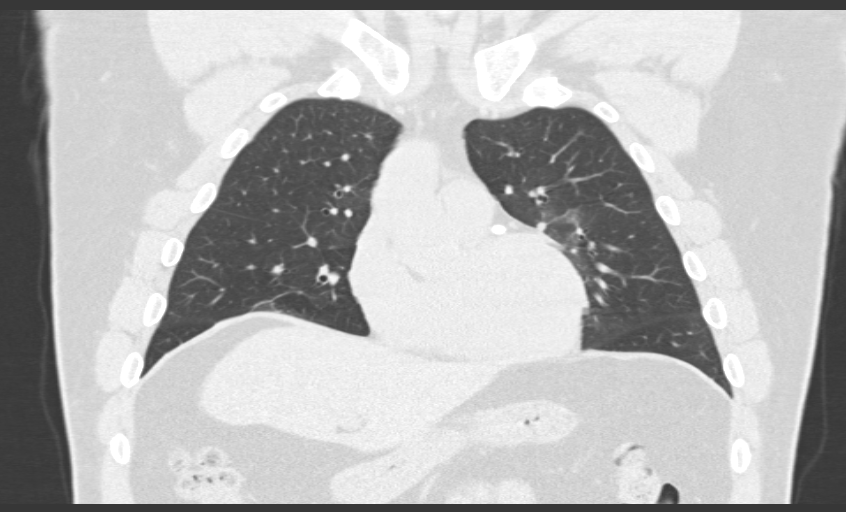
[im 52/87  lung]
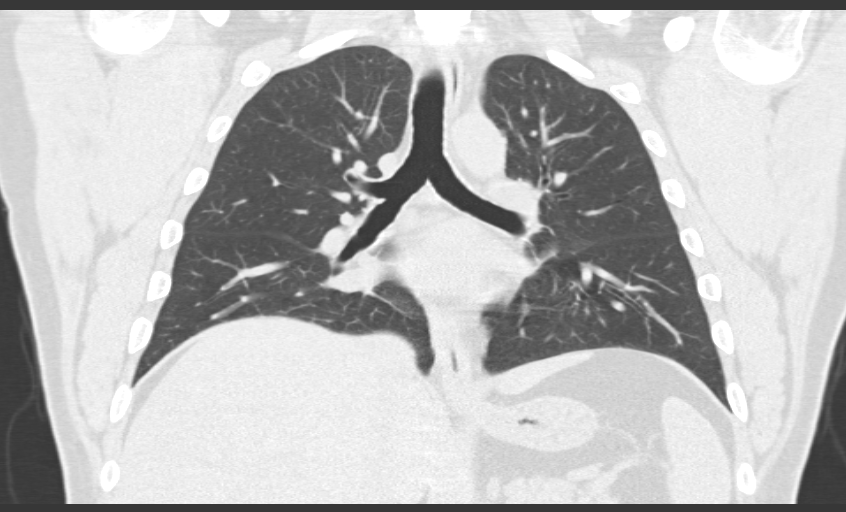

[14 of 36 positions shown; findings below may reference images not displayed]

FINDINGS: Mediastinum: Normal heart size. No pericardial effusion. Stent noted
within the LAD. No mediastinal or hilar adenopathy. Trachea appears
patent and is midline. Normal appearance of the esophagus.

Lungs/Pleura: There is no pleural effusion identified. Residual
perihilar ground-glass attenuation in the left upper lobe is
identified, image 23 of series 3. This is favored to represent an
area of resolving pneumonitis measuring 2.7 cm, image 23/ series 3.
Previously 6 cm. No new areas of airspace consolidation identified.

Upper Abdomen: No focal liver abnormality. Previous cholecystectomy.
No biliary dilatation. The visualized portions of the spleen are
normal.

Musculoskeletal: There is no aggressive lytic or sclerotic bone
lesion.
IMPRESSION: 1. Decrease in size of ground-glass attenuating opacity within the
perihilar left upper lobe compatible with a benign, likely
infectious or inflammatory process.
2. LAD stent.

## 2017-01-21 ENCOUNTER — Other Ambulatory Visit: Payer: Self-pay | Admitting: Cardiology

## 2017-01-21 ENCOUNTER — Ambulatory Visit: Payer: PRIVATE HEALTH INSURANCE | Attending: Adult Health | Admitting: Pulmonary Disease

## 2017-01-21 DIAGNOSIS — R0602 Shortness of breath: Secondary | ICD-10-CM

## 2017-01-21 DIAGNOSIS — I519 Heart disease, unspecified: Secondary | ICD-10-CM | POA: Diagnosis not present

## 2017-01-21 DIAGNOSIS — G4733 Obstructive sleep apnea (adult) (pediatric): Secondary | ICD-10-CM | POA: Diagnosis not present

## 2017-02-01 NOTE — Progress Notes (Signed)
Patient Name: Richard Simpson, Richard Simpson Date: 01/21/2017 Gender: Male D.O.B: [birth year] Age (years): 46 Referring Provider: Tammy Parrett Height (inches): 73 Interpreting Physician: Chesley Mires MD, ABSM Weight (lbs): 275 RPSGT: Peak, Robert BMI: 36 MRN: [id number] Neck Size: 20.00  CLINICAL INFORMATION Sleep Study Type: Split Night CPAP  Indication for sleep study: snoring, sleep disruption, daytime sleepiness, history of heart disease.  Epworth Sleepiness Score: 10  SLEEP STUDY TECHNIQUE As per the AASM Manual for the Scoring of Sleep and Associated Events v2.3 (April 2016) with a hypopnea requiring 4% desaturations.  The channels recorded and monitored were frontal, central and occipital EEG, electrooculogram (EOG), submentalis EMG (chin), nasal and oral airflow, thoracic and abdominal wall motion, anterior tibialis EMG, snore microphone, electrocardiogram, and pulse oximetry. Continuous positive airway pressure (CPAP) was initiated when the patient met split night criteria and was titrated according to treat sleep-disordered breathing.  MEDICATIONS Medications self-administered by patient taken the night of the study : N/A  RESPIRATORY PARAMETERS Diagnostic  Total AHI (/hr): 60.2 RDI (/hr): 61.2 OA Index (/hr): 29.4 CA Index (/hr): 2.3 REM AHI (/hr): 69.1 NREM AHI (/hr): 58.9 Supine AHI (/hr): N/A Non-supine AHI (/hr): 60.23 Min O2 Sat (%): 75.00 Mean O2 (%): 89.64 Time below 88% (min): 34.5   Titration  Optimal Pressure (cm): 8 AHI at Optimal Pressure (/hr): 3.7 Min O2 at Optimal Pressure (%): 86.0 Supine % at Optimal (%): 100 Sleep % at Optimal (%): 96   SLEEP ARCHITECTURE The recording time for the entire night was 432.8 minutes.  During a baseline period of 236.8 minutes, the patient slept for 128.5 minutes in REM and nonREM, yielding a sleep efficiency of 54.3%. Sleep onset after lights out was 76.0 minutes with a REM latency of 127.5 minutes. The patient spent 17.51% of  the night in stage N1 sleep, 69.65% in stage N2 sleep, 0.00% in stage N3 and 12.84% in REM.  During the titration period of 194.7 minutes, the patient slept for 175.8 minutes in REM and nonREM, yielding a sleep efficiency of 90.3%. Sleep onset after CPAP initiation was 5.8 minutes with a REM latency of 87.5 minutes. The patient spent 7.39% of the night in stage N1 sleep, 76.11% in stage N2 sleep, 0.00% in stage N3 and 16.49% in REM.  CARDIAC DATA The 2 lead EKG demonstrated sinus rhythm. The mean heart rate was 63.32 beats per minute. Other EKG findings include: None.  LEG MOVEMENT DATA The total Periodic Limb Movements of Sleep (PLMS) were 0. The PLMS index was 0.00 .  IMPRESSIONS - Severe obstructive sleep apnea occurred during the diagnostic portion of the study with AHI = 60.2/hour and SpO2 low of 75%. - He did well with CPAP 8 cm H2O.  DIAGNOSIS - Obstructive Sleep Apnea (327.23 [G47.33 ICD-10])  RECOMMENDATIONS - Trial of CPAP therapy on 8 cm H2O with a Small size Resmed Nasal Pillow Mask AirFit P10 mask and heated humidification.  [Electronically signed] 02/01/2017 08:16 AM  Chesley Mires MD, ABSM Diplomate, American Board of Sleep Medicine NPI: [id number]

## 2017-02-01 NOTE — Procedures (Signed)
Patient Name: Richard Simpson, Richard Simpson Date: 01/21/2017 Gender: Male D.O.B: 1970/05/04 Age (years): 46 Referring Provider: Tammy Parrett Height (inches): 73 Interpreting Physician: Chesley Mires MD, ABSM Weight (lbs): 275 RPSGT: Peak, Robert BMI: 36 MRN: [id number] Neck Size: 20.00  CLINICAL INFORMATION Sleep Study Type: Split Night CPAP  Indication for sleep study: snoring, sleep disruption, daytime sleepiness, history of heart disease.  Epworth Sleepiness Score: 10  SLEEP STUDY TECHNIQUE As per the AASM Manual for the Scoring of Sleep and Associated Events v2.3 (April 2016) with a hypopnea requiring 4% desaturations.  The channels recorded and monitored were frontal, central and occipital EEG, electrooculogram (EOG), submentalis EMG (chin), nasal and oral airflow, thoracic and abdominal wall motion, anterior tibialis EMG, snore microphone, electrocardiogram, and pulse oximetry. Continuous positive airway pressure (CPAP) was initiated when the patient met split night criteria and was titrated according to treat sleep-disordered breathing.  MEDICATIONS Medications self-administered by patient taken the night of the study : N/A  RESPIRATORY PARAMETERS Diagnostic  Total AHI (/hr): 60.2 RDI (/hr): 61.2 OA Index (/hr): 29.4 CA Index (/hr): 2.3 REM AHI (/hr): 69.1 NREM AHI (/hr): 58.9 Supine AHI (/hr): N/A Non-supine AHI (/hr): 60.23 Min O2 Sat (%): 75.00 Mean O2 (%): 89.64 Time below 88% (min): 34.5   Titration  Optimal Pressure (cm): 8 AHI at Optimal Pressure (/hr): 3.7 Min O2 at Optimal Pressure (%): 86.0 Supine % at Optimal (%): 100 Sleep % at Optimal (%): 96   SLEEP ARCHITECTURE The recording time for the entire night was 432.8 minutes.  During a baseline period of 236.8 minutes, the patient slept for 128.5 minutes in REM and nonREM, yielding a sleep efficiency of 54.3%. Sleep onset after lights out was 76.0 minutes with a REM latency of 127.5 minutes. The patient spent  17.51% of the night in stage N1 sleep, 69.65% in stage N2 sleep, 0.00% in stage N3 and 12.84% in REM.  During the titration period of 194.7 minutes, the patient slept for 175.8 minutes in REM and nonREM, yielding a sleep efficiency of 90.3%. Sleep onset after CPAP initiation was 5.8 minutes with a REM latency of 87.5 minutes. The patient spent 7.39% of the night in stage N1 sleep, 76.11% in stage N2 sleep, 0.00% in stage N3 and 16.49% in REM.  CARDIAC DATA The 2 lead EKG demonstrated sinus rhythm. The mean heart rate was 63.32 beats per minute. Other EKG findings include: None.  LEG MOVEMENT DATA The total Periodic Limb Movements of Sleep (PLMS) were 0. The PLMS index was 0.00 .  IMPRESSIONS - Severe obstructive sleep apnea occurred during the diagnostic portion of the study with AHI = 60.2/hour and SpO2 low of 75%. - He did well with CPAP 8 cm H2O.  DIAGNOSIS - Obstructive Sleep Apnea (327.23 [G47.33 ICD-10])  RECOMMENDATIONS - Trial of CPAP therapy on 8 cm H2O with a Small size Resmed Nasal Pillow Mask AirFit P10 mask and heated humidification.  [Electronically signed] 02/01/2017 08:16 AM  Chesley Mires MD, ABSM Diplomate, American Board of Sleep Medicine NPI: [id number]

## 2017-02-08 ENCOUNTER — Encounter: Payer: Self-pay | Admitting: Pulmonary Disease

## 2017-02-08 ENCOUNTER — Telehealth: Payer: Self-pay | Admitting: Pulmonary Disease

## 2017-02-08 ENCOUNTER — Ambulatory Visit (INDEPENDENT_AMBULATORY_CARE_PROVIDER_SITE_OTHER): Payer: PRIVATE HEALTH INSURANCE | Admitting: Pulmonary Disease

## 2017-02-08 VITALS — BP 120/72 | HR 62 | Ht 73.0 in | Wt 262.4 lb

## 2017-02-08 DIAGNOSIS — J849 Interstitial pulmonary disease, unspecified: Secondary | ICD-10-CM | POA: Diagnosis not present

## 2017-02-08 DIAGNOSIS — G4733 Obstructive sleep apnea (adult) (pediatric): Secondary | ICD-10-CM | POA: Diagnosis not present

## 2017-02-08 NOTE — Assessment & Plan Note (Signed)
Prescription for CPAP will be sent to [facility]  8 cm H2O with a Small size Resmed Nasal Pillow Mask AirFit P10 mask and heated humidification  Weight loss encouraged, compliance with goal of at least 4-6 hrs every night is the expectation. Advised against medications with sedative side effects Cautioned against driving when sleepy - understanding that sleepiness will vary on a day to day basis

## 2017-02-08 NOTE — Patient Instructions (Signed)
Prescription for CPAP will be sent to [facility] CPAP  8 cm H2O with a Small size Resmed Nasal Pillow Mask AirFit P10 mask and heated humidification  I will discussed with Dr. Amil Amen about increasing treatment for arthritis-you likely have inflammation in your lungs related to rheumatoid arthritis

## 2017-02-08 NOTE — Progress Notes (Signed)
   Subjective:    Patient ID: Richard Simpson, male    DOB: 12-17-1970, 47 y.o.   MRN: [id number]  HPI  47 yo male former smoker , disabled Curator with Rheumatolid arthritis followed in past for abnormal CT chest with GGO in [facility] .  CAD s/p stent 2016.   He reports increasing dyspnea, can only go 15 minutes on the treadmill before he gets winded. Denies cough.  His Remicade has now been decreased to every 5 weeks has mild joint swelling towards the end of this.  HRCT 12/2016 >> worsening multifocal areas of ground-glass attenuation, most confluent in the left upper lobe, but with progressive involvement in the lower lobes of the lungs bilaterally as well.  He also underwent sleep study 12/28-weight 275 pounds, showed AHI 60/hour with lowest desaturation 75%.  This was corrected by CPAP 8 cm with nasal pillows  Significant tests/ events reviewed    PFTs 12/2013 >>no obs,FVC 77%, nml DLCO -  Mild restriction ?effect of obesity   PFT 12/2016  moderate restriction . FEV1 70%, ratio 86, FVC 63%, DLCO 74%.  06/2013 NSTEMI, PTCA to LAD, EF 45% Rpt echo 04/2015 >> nml LVEF  5/31/2016CT angio chest >>Patchy regions of ground-glass attenuation opacity 61mm &6cm in the medial aspect of the left upper lobe with associated volume loss 09/2014 CT chest >Decrease in size of ground-glass attenuating opacity within the perihilar left upper lobe compatible with a benign, likely infectious or inflammatory process.  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 Also  denies presyncope, palpitations, heartburn, abdominal pain, nausea, vomiting, diarrhea or change in bowel or urinary habits, dysuria,hematuria, rash, arthralgias, visual complaints, headache, numbness weakness or ataxia.     Objective:   Physical Exam   Gen. Pleasant, obese, in no distress ENT - no lesions, no post nasal drip Neck: No JVD, no thyromegaly, no carotid bruits Lungs: no use of accessory muscles, no dullness to percussion, decreased without rales or rhonchi  Cardiovascular: Rhythm regular, heart sounds  normal, no murmurs or gallops, no peripheral edema Musculoskeletal: No deformities, no cyanosis or clubbing , no tremors        Assessment & Plan:

## 2017-02-08 NOTE — Telephone Encounter (Signed)
Per RA, he spoke with Dr. Amil Amen. Wants patient to start Prednisone. 20mg  x1 week, 15mg  x1 week, 10mg  x1 week and stay at the 10mg  dosage.   Will call in medication once patient is aware.

## 2017-02-08 NOTE — Assessment & Plan Note (Addendum)
I will discuss with Dr. 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  He may need low-dose prednisone he is concerned about side effects Options include prednisone or alternative immunosuppressant or simply decreasing intervals of Remicade   Also discussed bronchoscopy, will pursue if he does not respond to empiric therapy

## 2017-02-08 NOTE — Telephone Encounter (Signed)
Called and spoke with pt letting him know that RA wanted him to start on prednisone after speaking with Dr.Beekman.  Told pt that we were going to do 20mg  x1 week, then 15mg  x1 week, then 10mg  x1 week and stay on the 10mg  dose after doing the taper.  Dr. Elsworth Soho, please advise the quantity you want [place] to send in for pt knowing he is going to continue on the 10mg  prednisone after the taper.  Thanks!

## 2017-02-08 NOTE — Telephone Encounter (Signed)
Pt is returning call. Cb is [phone].

## 2017-02-09 MED ORDER — PREDNISONE 10 MG PO TABS: 10.0000 mg | ORAL_TABLET | Freq: Every day | ORAL | 1 refills | Status: DC

## 2017-02-09 NOTE — Telephone Encounter (Signed)
Spoke with the pt to confirm pharm  Rx was sent

## 2017-02-09 NOTE — Telephone Encounter (Signed)
Pl send 10mg  tabs x 90 with 1 refill

## 2017-02-15 ENCOUNTER — Other Ambulatory Visit: Payer: Self-pay | Admitting: Cardiovascular Disease

## 2017-02-22 ENCOUNTER — Other Ambulatory Visit: Payer: Self-pay | Admitting: Student

## 2017-02-22 NOTE — Telephone Encounter (Signed)
Richard Simpson patient

## 2017-03-22 ENCOUNTER — Other Ambulatory Visit: Payer: Self-pay | Admitting: Cardiovascular Disease

## 2017-03-25 ENCOUNTER — Encounter: Payer: Self-pay | Admitting: Pulmonary Disease

## 2017-03-25 ENCOUNTER — Ambulatory Visit (INDEPENDENT_AMBULATORY_CARE_PROVIDER_SITE_OTHER)
Admission: RE | Admit: 2017-03-25 | Discharge: 2017-03-25 | Disposition: A | Discharge status: Home / Self Care | Payer: PPO | Source: Ambulatory Visit | Attending: Pulmonary Disease | Admitting: Pulmonary Disease

## 2017-03-25 ENCOUNTER — Ambulatory Visit (INDEPENDENT_AMBULATORY_CARE_PROVIDER_SITE_OTHER): Payer: PPO | Admitting: Pulmonary Disease

## 2017-03-25 DIAGNOSIS — J849 Interstitial pulmonary disease, unspecified: Secondary | ICD-10-CM | POA: Diagnosis not present

## 2017-03-25 DIAGNOSIS — M05749 Rheumatoid arthritis with rheumatoid factor of unspecified hand without organ or systems involvement: Secondary | ICD-10-CM | POA: Diagnosis not present

## 2017-03-25 DIAGNOSIS — G4733 Obstructive sleep apnea (adult) (pediatric): Secondary | ICD-10-CM

## 2017-03-25 MED ORDER — PREDNISONE 5 MG PO TABS: ORAL_TABLET | ORAL | 1 refills | Status: DC

## 2017-03-25 NOTE — Assessment & Plan Note (Signed)
Chest x-ray today. Drop prednisone to 15 mg daily. On March 15 drop prednisone to 10 mg daily  Ask Dr. Amil Amen with her we should add another medication for rheumatoid arthritis as prednisone is tapered off.

## 2017-03-25 NOTE — Assessment & Plan Note (Signed)
Will need alternative medication of prednisone since this causes him to be required. He has tried methotrexate in the past without response, defer to rheumatology

## 2017-03-25 NOTE — Progress Notes (Signed)
   Subjective:    Patient ID: Richard Simpson, male    DOB: [birth year], 47 y.o.   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  CAD s/p stent 2016.   He developed increased dyspnea in December HRCT 12/2016 >> worsening multifocal areas of ground-glass attenuation, most confluent in the left upper lobe, but with progressive involvement in the lower lobes of the lungs bilaterally as well. He was started on prednisone on his last visit, started at 40 mg and tapered down to prednisone 20 mg.  He feels markedly improved with his dyspnea.  He has been able to workout for about 30 minutes on a treadmill, has been able to lose about 14 pounds. Prednisone makes him mean and wired   His joint symptoms are much improved, he is taking Remicade now every 5 weeks. I spoke to Dr. Amil Amen after his last visit, he has not been in to see them yet  He was diagnosed with OSA after sleep study and started on CPAP at 8 cm with nasal pillows.  He has now adjusted to medium size pillows, he sleeps on his belly so his compliance is poor and often the pillows are on the ground. Download was reviewed and this shows excellent control of events with residual AHI of only 1/hour and average usage up to 5 hours per night with only 2 missed nights in the last month  Wife feels that he sleeps so much better when he is on the machine.  He does feel better rested    Significant tests/ events reviewed  Split NPSG  12/2016 -weight 275 pounds, showed AHI 60/hour with lowest desaturation 75%.  This was corrected by CPAP 8 cm with nasal pillows  PFTs 12/2013 >>no obs,FVC 77%, nml DLCO -  Mild restriction ?effect of obesity  PFT 12/2016  moderate restriction .FEV1 70%, ratio 86, FVC 63%, DLCO 74%.  06/2013 NSTEMI, PTCA to LAD, EF 45% Rpt echo 04/2015 >> nml LVEF  5/31/2016CT angio chest  >>Patchy regions of ground-glass attenuation opacity 66mm &6cm in the medial aspect of the left upper lobe with associated volume loss 09/2014 CT chest >Decrease in size of ground-glass attenuating opacity within the perihilar left upper lobe compatible with a benign, likely infectious or inflammatory process.  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 Also denies presyncope, palpitations, heartburn, abdominal pain, nausea, vomiting, diarrhea or change in bowel or urinary habits, dysuria,hematuria, rash, arthralgias, visual complaints, headache, numbness weakness or ataxia.     Objective:   Physical Exam  Gen. Pleasant, obese, in no distress ENT - no lesions, no post nasal drip Neck: No JVD, no thyromegaly, no carotid bruits Lungs: no use of accessory muscles, no dullness to percussion, decreased without rales or rhonchi  Cardiovascular: Rhythm regular, heart sounds  normal, no murmurs or gallops, no peripheral edema Musculoskeletal: No deformities, no cyanosis or clubbing , no tremors       Assessment & Plan:

## 2017-03-25 NOTE — Patient Instructions (Signed)
Chest x-ray today. Drop prednisone to 15 mg daily. On March 15 drop prednisone to 10 mg daily  Ask Dr. Amil Amen with her we should add another medication for rheumatoid arthritis as prednisone is tapered off.  CPAP really works well at current settings. Try nasal mask if pillows cause nasal irritation. Goal would be to use it at least 4-6 hours every night

## 2017-03-25 NOTE — Assessment & Plan Note (Signed)
CPAP really works well at current settings. Try nasal mask if pillows cause nasal irritation.   Weight loss encouraged, compliance with goal of at least 4-6 hrs every night is the expectation. Advised against medications with sedative side effects Cautioned against driving when sleepy - understanding that sleepiness will vary on a day to day basis

## 2017-04-22 DIAGNOSIS — Z6833 Body mass index (BMI) 33.0-33.9, adult: Secondary | ICD-10-CM | POA: Diagnosis not present

## 2017-04-22 DIAGNOSIS — M0609 Rheumatoid arthritis without rheumatoid factor, multiple sites: Secondary | ICD-10-CM | POA: Diagnosis not present

## 2017-04-22 DIAGNOSIS — R0600 Dyspnea, unspecified: Secondary | ICD-10-CM | POA: Diagnosis not present

## 2017-04-22 DIAGNOSIS — M2550 Pain in unspecified joint: Secondary | ICD-10-CM | POA: Diagnosis not present

## 2017-04-22 DIAGNOSIS — I2510 Atherosclerotic heart disease of native coronary artery without angina pectoris: Secondary | ICD-10-CM | POA: Diagnosis not present

## 2017-04-22 DIAGNOSIS — E669 Obesity, unspecified: Secondary | ICD-10-CM | POA: Diagnosis not present

## 2017-04-22 DIAGNOSIS — M5440 Lumbago with sciatica, unspecified side: Secondary | ICD-10-CM | POA: Diagnosis not present

## 2017-04-22 DIAGNOSIS — J849 Interstitial pulmonary disease, unspecified: Secondary | ICD-10-CM | POA: Diagnosis not present

## 2017-04-24 DIAGNOSIS — G4733 Obstructive sleep apnea (adult) (pediatric): Secondary | ICD-10-CM | POA: Diagnosis not present

## 2017-04-25 ENCOUNTER — Other Ambulatory Visit: Payer: Self-pay | Admitting: Cardiovascular Disease

## 2017-04-25 NOTE — Telephone Encounter (Signed)
refill 

## 2017-04-28 DIAGNOSIS — M9905 Segmental and somatic dysfunction of pelvic region: Secondary | ICD-10-CM | POA: Diagnosis not present

## 2017-04-28 DIAGNOSIS — M9901 Segmental and somatic dysfunction of cervical region: Secondary | ICD-10-CM | POA: Diagnosis not present

## 2017-04-28 DIAGNOSIS — M9902 Segmental and somatic dysfunction of thoracic region: Secondary | ICD-10-CM | POA: Diagnosis not present

## 2017-04-28 DIAGNOSIS — M545 Low back pain: Secondary | ICD-10-CM | POA: Diagnosis not present

## 2017-04-28 DIAGNOSIS — M9903 Segmental and somatic dysfunction of lumbar region: Secondary | ICD-10-CM | POA: Diagnosis not present

## 2017-04-28 DIAGNOSIS — M542 Cervicalgia: Secondary | ICD-10-CM | POA: Diagnosis not present

## 2017-05-03 ENCOUNTER — Encounter: Payer: Self-pay | Admitting: Cardiovascular Disease

## 2017-05-03 ENCOUNTER — Ambulatory Visit (INDEPENDENT_AMBULATORY_CARE_PROVIDER_SITE_OTHER): Payer: PPO | Admitting: Cardiovascular Disease

## 2017-05-03 VITALS — BP 99/70 | HR 60 | Ht 73.0 in | Wt 242.6 lb

## 2017-05-03 DIAGNOSIS — Z9861 Coronary angioplasty status: Secondary | ICD-10-CM

## 2017-05-03 DIAGNOSIS — E669 Obesity, unspecified: Secondary | ICD-10-CM

## 2017-05-03 DIAGNOSIS — J849 Interstitial pulmonary disease, unspecified: Secondary | ICD-10-CM | POA: Diagnosis not present

## 2017-05-03 DIAGNOSIS — I2510 Atherosclerotic heart disease of native coronary artery without angina pectoris: Secondary | ICD-10-CM | POA: Diagnosis not present

## 2017-05-03 DIAGNOSIS — E785 Hyperlipidemia, unspecified: Secondary | ICD-10-CM

## 2017-05-03 DIAGNOSIS — I10 Essential (primary) hypertension: Secondary | ICD-10-CM

## 2017-05-03 DIAGNOSIS — M069 Rheumatoid arthritis, unspecified: Secondary | ICD-10-CM | POA: Diagnosis not present

## 2017-05-03 NOTE — Progress Notes (Signed)
Patient ID: Richard Simpson, male   DOB: 10/06/1970, 47 y.o.   MRN: [id number]     HPI: Richard Simpson is a 47 y.o. male who presents to the office today for a 4 month follow-up cardiology evaluation.  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 He developed severe chest pain the evening of July 04, 2014 leading to his [place] hospitalization on the morning of June 10.  He was seen in consultation by Dr. Branch and transferred for urgent cardiac catheterization which was done by me. This revealed a subtotal 99+ percent ostial LAD stenosis followed by proximal 70% and 50% stenoses.  Initial ejection fraction was 45% with anterolateral hypocontractility.  He underwent successful intervention with insertion of Synergy DES 3.520 mm stent placed from the LAD ostium to cover all 3 lesions.  Subsequently, he noticed marked benefit in his previous exertional shortness of breath symptomatology.  He was seen  In follow-up by Christopher Berge, NP and due to and ACE-induced cough was started on losartan in place of lisinopril. He has a history of arthritic disease and in the past had been treated for probable psoriatic arthritis but he is now felt to have rheumatoid arthritis.  An echo Doppler study on 08/09/2014 which mild focal basal hypertrophy of the septum.  There are no wall motion abnormalities.  Ejection fraction was 60-65%.  There was grade 1 diastolic dysfunction.  The mitral valve was mildly calcified at its annulus and there was trivial aortic insufficiency.  He participated in cardiac rehabilitation last year.  He developed recurrent episodes of chest tightness and underwent repeat cardiac catheterization on 03/24/2015 by Dr. Harding which showed a widely patent stent at the ostium and proximal LAD segment.  There was mild 35% narrowing after the takeoff of the first diagonal vessel.  His circumflex and RCA  were considered to be normal.  His LVEDP was mildly increased.  It was felt that there was no new evidence for a new CAD lesion to explain his symptoms.  It was felt that possibly his symptoms were related to microvascular disease versus diastolic dysfunction.  Subsequently, he was started on Lasix 20 mg.  He also has been taking amlodipine 5 mg, losartan 25 mg, metoprolol tartrate 25 mg twice a day.  He continues to be on aspirin 81 mg and Brilinta 90 mg twice a day.  His wife was very concerned about this 35% LAD stenosis.   When I saw him in March 2017, I titrated his losartan from 25 mg to 37.5 mg and recommended addition of Zetia to his 80 mg atorvastatin in attempt to induce further plaque regression.  He underwent an echo Doppler study on 04/28/2015 which showed an ejection fraction at 60-65%.  He did not have any regional wall motion abnormalities.  He had mild left atrial dilatation.  He was felt to have normal diastolic function.  On subsequent evaluation he continued, he has continued to experience episodes of chest discomfort which he believes occurs with activity.  Due to potential microvascular component to symptomatology I added nitrate therapy.  I referred him for a cardiopulmonary met test which did not reveal significant cardiac or pulmonary limitation.    He was later seen by Rhonda Barrett, PAC, and because of increasing recurrent chest pain.  He underwent repeat cardiac catheterization by me 11/20/2015.  This did not demonstrate any residual CAD and had a widely   patent LAD stent extending from the ostium ending prior to the takeoff of the first diagonal vessel.  There was mild 15% narrowing in the LAD after diagonal vessel which was improved from previous evaluation.  His circumflex, intermediate, and dominant RCA vessels were normal.   He was seen in June 2018 by Melvyn Neth and was seen by Almyra Deforest on 12/28/2016.  His isosorbide was increased to 30 mg and he has noticed improvement  and has not taken subungual nitroglycerin.  He's gotten short of breath with hunting.  He referred him for an echo Doppler study on 01/06/2017, which showed an EF 50-55% with anteroapical, septal hypokinesis and grade 1 diastolic dysfunction.  He has experienced shortness of breath with hunting.   I had not seen him since February 2017 last saw him on January 13, 2017.  He also has undergone pulmonary evaluation and was found to have restrictive physiology on PFTs.  He has been followed by Dr. although in a high resolution CT scan showed worsening of multifocal areas of groundglass attenuation most confluent in the left upper lobe but with progressive involvement in the lower lobes of the lungs bilaterally as well.  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  He is now off Remicade and is on CellCept.  He notes significant improvement in his shortness of breath.  He has lost over 40 pounds.  He is breathing better.  He denies recurrent anginal symptoms.  He presents for reevaluation.  Past Medical History:  Diagnosis Date  . Anxiety   . CAD S/P percutaneous coronary angioplasty 10/2013   a. 10/2013 Cath: mod LAD dzs->nl FFR-> medically managed;  b. 06/2014 NSTEMI/PCi: LAD 99ost, 70p, 64m(entire area covered with a 3.5x20 Synergy DES), LCX nl, RCA 269m10d, EF 45-50%. c. 10/2015: cath showing patent LAD stent with 15% Prox LAD stenosis. EF 55-65%.   . 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 Depression   . Dyslipidemia, goal LDL below 70   . Essential hypertension   . GERD (gastroesophageal reflux disease)   . Headache   . History of kidney stones   . Ischemic cardiomyopathy - Resolved 06/2014   a. 06/2014 EF 45-50% by LV gram b. Echo July 2016: EF 60-65%. No RWMA. Gr 1 DD   . [place]STEMI (non-ST elevated myocardial infarction) [place]  .  Obesity (BMI 30-39.9) 10/29/2013  . Primary snoring 10/29/2013  . Rheumatoid arthritis ([facility]8/30/2015  . RLS (restless legs syndrome) 10/29/2013    Past Surgical History:  Procedure Laterality Date  . CARDIAC CATHETERIZATION N/A 07/05/2014   Procedure: Left Heart Cath and Coronary Angiography;  Surgeon: ThTroy SineMD;  [facility];  Service: Cardiovascular;  NSTEMI --> Ostial LAd 99%, prox 80% & early mid 50% --> PCI  . CARDIAC CATHETERIZATION  07/05/2014   Procedure: Coronary Stent Intervention;  Surgeon: ThTroy SineMD;  [facility];  Service: Cardiovascular; Ostial-prox LAD tandem 99, 80 & 50% --> Synergy DES 3.5 x 20 (3.74)  . CARDIAC CATHETERIZATION N/A 03/24/2015   Procedure: Left Heart Cath and Coronary Angiography;  Surgeon: DaLeonie ManMD;  [facility];  Service: Cardiovascular;  Laterality: N/A;  . CARDIAC CATHETERIZATION N/A 11/20/2015   Procedure: Left Heart Cath and Coronary Angiography;  Surgeon: ThMarcello Moores  A Kelly, MD;  [facility];  Service: Cardiovascular;  Laterality: N/A;  . CHOLECYSTECTOMY     10 yrs ago-jenkins  . COLONOSCOPY N/A 03/23/2013   Procedure: COLONOSCOPY;  Surgeon: Robert M [name], MD;  [facility];  Service: Endoscopy;  Laterality: N/A;  2:15-moved to 1030 Staff notified pt  . COLONOSCOPY WITH PROPOFOL N/A 06/10/2016   Procedure: COLONOSCOPY WITH PROPOFOL;  Surgeon: [name], Robert M, MD;  [facility];  Service: Endoscopy;  Laterality: N/A;  730   . FRACTIONAL FLOW RESERVE WIRE  11/23/2013   Procedure: FRACTIONAL FLOW RESERVE WIRE;  Surgeon: Jayadeep S Varanasi, MD;  Location: [facility];  Service: Cardiovascular;;  . GALLBLADDER SURGERY    . LEFT HEART CATHETERIZATION WITH CORONARY ANGIOGRAM N/A 11/23/2013   Procedure: LEFT HEART CATHETERIZATION WITH CORONARY ANGIOGRAM;  Surgeon: Jayadeep S Varanasi, MD;  Location: [facility];  Service: Cardiovascular;   Laterality: N/A;  . MASS EXCISION  09/09/2010   Procedure: EXCISION MASS;  Surgeon: Stanley Harrison, MD;  Location: [facility];  Service: Orthopedics;  Laterality: Left;  Excision Mass Left Long Finger  . POLYPECTOMY  06/10/2016   Procedure: POLYPECTOMY;  Surgeon: [name], Robert M, MD;  [facility];  Service: Endoscopy;;  colon  . THROAT SURGERY     nodules removed from throat as child  . TRANSTHORACIC ECHOCARDIOGRAM   July 2016:    EF 60-65%. No RWMA. Gr 1 DD    Allergies  Allergen Reactions  . Lisinopril Cough  . Prednisone Other (See Comments)    Makes angry    Current Outpatient Medications  Medication Sig Dispense Refill  . amLODipine (NORVASC) 5 MG tablet TAKE 1 TABLET BY MOUTH ONCE A DAY. (Patient taking differently: TAKE 1/2 TABLET BY MOUTH ONCE A DAY.) 30 tablet 5  . aspirin EC 81 MG tablet Take 1 tablet (81 mg total) by mouth daily. 30 tablet 3  . atorvastatin (LIPITOR) 80 MG tablet TAKE (1) TABLET BY MOUTH ONCE DAILY. 30 tablet 11  . clonazePAM (KLONOPIN) 0.5 MG tablet Take 0.5 mg by mouth 2 (two) times daily as needed for anxiety (takes 1 time daily and 2 nd dose only if needed).     . clopidogrel (PLAVIX) 75 MG tablet TAKE 1 TABLET BY MOUTH ONCE A DAY. 30 tablet 2  . furosemide (LASIX) 20 MG tablet TAKE (1) TABLET BY MOUTH ONCE DAILY. (Patient taking differently: TAKE (1) TABLET BY MOUTH AS NEEDED) 30 tablet 3  . isosorbide mononitrate (IMDUR) 30 MG 24 hr tablet Take 1 tablet (30 mg total) by mouth daily. 90 tablet 1  . loratadine (CLARITIN) 10 MG tablet Take 10 mg by mouth daily.     . losartan (COZAAR) 25 MG tablet Take 25 mg  In the morning 135 tablet 3  . metoprolol tartrate (LOPRESSOR) 25 MG tablet TAKE (1) TABLET BY MOUTH TWICE DAILY. 60 tablet 5  . nitroGLYCERIN (NITROSTAT) 0.4 MG SL tablet DISSOLVE 1 TABLET UNDER TONGUE FOR CHEST TIGHTNESS AND SHORTNESS OF BREATH. MAY TAKE EVERY 5 MINUTES UP TO 3 DOSES. IF NO RELIEF AFTER 3 DOS 25 tablet 0  . ranitidine  (ZANTAC) 150 MG capsule Take 150 mg by mouth 2 (two) times daily.     . traMADol (ULTRAM) 50 MG tablet Take 50 mg by mouth every 6 (six) hours as needed for moderate pain.     . mycophenolate (CELLCEPT) 500 MG tablet      No current facility-administered medications   for this visit.     Social History   Socioeconomic History  . Marital status: Married    Spouse name: [name]  . Number of children: 2  . Years of education: 12th grade  . Highest education level: Not on file  Occupational History  . Occupation: Retired Chemical engineer: [employer]  Social Needs  . Financial resource strain: Not on file  . Food insecurity:    Worry: Not on file    Inability: Not on file  . Transportation needs:    Medical: Not on file    Non-medical: Not on file  Tobacco Use  . Smoking status: Former Smoker    Packs/day: 0.25    Years: 15.00    Pack years: 3.75    Types: Cigarettes    Last attempt to quit: 09/04/2003    Years since quitting: 13.6  . Smokeless tobacco: Current User    Types: Chew  Substance and Sexual Activity  . Alcohol use: Yes    Alcohol/week: 0.0 oz    Types: 1 - 2 Cans of beer per week    Comment: rare  . Drug use: No  . Sexual activity: Yes  Lifestyle  . Physical activity:    Days per week: Not on file    Minutes per session: Not on file  . Stress: Not on file  Relationships  . Social connections:    Talks on phone: Not on file    Gets together: Not on file    Attends religious service: Not on file    Active member of club or organization: Not on file    Attends meetings of clubs or organizations: Not on file    Relationship status: Not on file  . Intimate partner violence:    Fear of current or ex partner: Not on file    Emotionally abused: Not on file    Physically abused: Not on file    Forced sexual activity: Not on file  Other Topics Concern  . Not on file  Social History Narrative   Patient lives at home with his wife [name]).      Patient works full time Dentist..   Education high school.   Right handed.   Caffeine one cup of coffee daily and one soda.    Family History  Problem Relation Age of Onset  . Hypertension Mother   . Diabetes Mother   . Arthritis Paternal Uncle   . Cancer Maternal Grandfather   . Hypertension Maternal Grandmother   . Diabetes Maternal Grandmother   . Diabetes Unknown   . Diabetes Unknown   . Diabetes Paternal Grandfather   . Anesthesia problems Neg Hx   . Hypotension Neg Hx   . Malignant hyperthermia Neg Hx   . Pseudochol deficiency Neg Hx   . Heart attack Neg Hx   . Stroke Neg Hx     ROS General: Negative; No fevers, chills, or night sweats HEENT: Negative; No changes in vision or hearing, sinus congestion, difficulty swallowing Pulmonary: Positive for interstitial lung disease Cardiovascular: See HPI: No chest pain, presyncope, syncope, palpatations GI: Negative; No nausea, vomiting, diarrhea, or abdominal pain GU: Negative; No dysuria, hematuria, or difficulty voiding Musculoskeletal: Positive for rheumatoid arthritis Hematologic: Negative; no easy bruising, bleeding Endocrine: Negative; no heat/cold intolerance; no diabetes, Neuro: Negative; no changes in balance, headaches Skin: Negative; No rashes or skin lesions Psychiatric: Negative; No behavioral problems, depression Sleep: Positive for snoring,  No  daytime sleepiness, hypersomnolence, bruxism, restless legs, hypnogognic hallucinations. Other comprehensive 14 point system review is negative   Physical Exam BP 99/70   Pulse 60   Ht 6' 1" (1.854 m)   Wt 242 lb 9.6 oz (110 kg)   BMI 32.01 kg/m    Repeat blood pressure by me was 100/70 supine and 102/70 standing  Wt Readings from Last 3 Encounters:  05/03/17 242 lb 9.6 oz (110 kg)  03/25/17 248 lb 3.2 oz (112.6 kg)  02/08/17 262 lb 6.4 oz (119 kg)  Weight loss from a peak weight of 275   General: Alert, oriented, no distress.  Skin:  normal turgor, no rashes, warm and dry HEENT: Normocephalic, atraumatic. Pupils equal round and reactive to light; sclera anicteric; extraocular muscles intact;  Nose without nasal septal hypertrophy Mouth/Parynx benign; Mallinpatti scale 3 Neck: Thick neck no JVD, no carotid bruits; normal carotid upstroke Lungs: clear to ausculatation and percussion; no wheezing or rales Chest wall: without tenderness to palpitation Heart: PMI not displaced, RRR, s1 s2 normal, 1/6 systolic murmur, no diastolic murmur, no rubs, gallops, thrills, or heaves Abdomen: soft, nontender; no hepatosplenomehaly, BS+; abdominal aorta nontender and not dilated by palpation. Back: no CVA tenderness Pulses 2+ Musculoskeletal: full range of motion, normal strength, no joint deformities Extremities: no clubbing cyanosis or edema, Homan's sign negative  Neurologic: grossly nonfocal; Cranial nerves grossly wnl Psychologic: Normal mood and affect   ECG (independently read by me): Sinus rhythm at 60 bpm.  No ectopy.  Intervals.  January 13, 2017 ECG (independently read by me):  Normal sinus hythm at 80 bpm.  QTc terval 440 ms.  PR intrval 126 ms. nodiagnostic T changes in leads 3 an aVF.  June 2017 ECG (independently read by me): Sinus bradycardia 57 bpm.  Normal intervals..  She noted T-wave changes in lead 3.  March 2017 ECG (independently read by me): Normal sinus rhythm at 69 bpm.  No ectopy.  QTc interval 424 ms  October 2016 ECG (independently read by me): Normal sinus rhythm at 65 bpm.  Nondiagnostic T changes.  No ECG evidence of infarction.  July 2016 ECG (independently read by me):  Normal sinus rhythm at 65 bpm.  Nonspecific T-wave abnormality.  LABS:  BMP Latest Ref Rng & Units 06/09/2016 11/11/2015 04/16/2015  Glucose 65 - 99 mg/dL 91 88 98  BUN 6 - 20 mg/dL 19 21 16  Creatinine 0.61 - 1.24 mg/dL 0.89 1.04 0.87  BUN/Creat Ratio 9 - 20 - - -  Sodium 135 - 145 mmol/L 135 140 139  Potassium 3.5 - 5.1  mmol/L 3.7 4.3 4.1  Chloride 101 - 111 mmol/L 106 105 109  CO2 22 - 32 mmol/L 21(L) 25 25  Calcium 8.9 - 10.3 mg/dL 9.0 9.3 8.7(L)    Hepatic Function Latest Ref Rng & Units 04/16/2015 08/06/2014 07/04/2014  Total Protein 6.5 - 8.1 g/dL 7.0 6.7 6.8  Albumin 3.5 - 5.0 g/dL 4.2 4.3 4.2  AST 15 - 41 U/L 31 36 35  ALT 17 - 63 U/L 40 51(H) 51  Alk Phosphatase 38 - 126 U/L 69 82 55  Total Bilirubin 0.3 - 1.2 mg/dL 1.2 0.9 1.0  Bilirubin, Direct 0.0 - 0.3 mg/dL - - -    CBC Latest Ref Rng & Units 06/09/2016 11/11/2015 04/16/2015  WBC 4.0 - 10.5 K/uL 4.4 6.4 4.0  Hemoglobin 13.0 - 17.0 g/dL 16.0 16.1 15.8  Hematocrit 39.0 - 52.0 % 45.1 46.1 44.3  Platelets 150 - 400   K/uL 179 186 149(L)   Lab Results  Component Value Date   MCV 91.3 06/09/2016   MCV 93.7 11/11/2015   MCV 90.0 04/16/2015    Lab Results  Component Value Date   TSH 0.707 04/16/2015    BNP No results found for: BNP  ProBNP    Component Value Date/Time   PROBNP 18.0 03/19/2015 1034    Lipid Panel     Component Value Date/Time   CHOL 103 04/16/2015 0820   CHOL 114 08/06/2014 0746   TRIG 70 04/16/2015 0820   HDL 28 (L) 04/16/2015 0820   HDL 28 (L) 08/06/2014 0746   CHOLHDL 3.7 04/16/2015 0820   VLDL 14 04/16/2015 0820   LDLCALC 61 04/16/2015 0820   LDLCALC 63 08/06/2014 0746     RADIOLOGY: No results found.  IMPRESSION:  1. Essential hypertension   2. CAD S/P percutaneous coronary angioplasty   3. Hyperlipidemia LDL goal <70   4. Interstitial lung disease (HCC)   5. Rheumatoid arthritis, involving unspecified site, unspecified rheumatoid factor presence (HCC)   6. 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  He presented with an acute coronary syndrome and ruled in for non-ST segment elevation MI with a peak troponin of 18.22 on 07/05/2014  and underwent successful PCI involving a subtotal greater than 99% ostial LAD stenosis with ultimate insertion of a 3.520 mm [employer]'s Synergy DES stent which covered 3 LAD lesions. His ECG  does not show any evidence for his prior MI.  An echo Doppler study on 08/09/2014 showed an EF of 60-65% with grade 1 diastolic dysfunction.  There was mild mitral annular calcification and trivial aortic insufficiency.  He underwent repeat catheterization in February 2017 due to recurrent episodes of chest pain.  This revealed a widely patent stent and only mild nonobstructive 35% LAD stenosis.  He has been on increasing doses of losartan.  A follow-up echo Doppler study done in April 2017 showed normal systolic as well as normal diastolic function without previously noted grade 1 diastolic dysfunction on ARB therapy. .  He continues to have chest pain symptomatology and apparently had an unremarkable cardiopulmonary met test.  Due to recurrent continued symptomatology.  His last cardiac catheterization was done in October 2017 which again showed a widely patent LAD stent.  At that time continued medical therapy was recommended with consideration for possible noncardiac etiology to his nitrate responsive chest pain.  Subsequently been found to have interstitial lung disease and a high resolution CT scan had shown worsening multifocal areas of groundglass attenuation in multiple lung zones.  His dyspnea significantly improved with prednisone which ultimately was weaned.  In addition, he has lost over 40 pounds from his peak weight.  He is breathing better.  He has rheumatoid arthritis and was taken off Remicade and is now doing well with CellCept.  Not having any recurrent chest pain symptomatology.  Blood pressure today is on the low side of normal and he is asymptomatic with reference to dizziness.  I have recommended he change his Lasix from 20 mg to just as needed.  He continues to be on amlodipine 5 mg and  metoprolol 25 mg twice a day in addition to very low-dose losartan.  If his blood pressure remains low losartan may be able to be discontinued.  He   also continues to take isosorbide mononitrate.  He tells me complete set of blood work will be obtained by his primary physician next month.  He continues to be on atorvastatin for hyperlipidemia.  He is on DAPT therapy with aspirin and Plavix.  I will see him in 6 months for reevaluation   Time spent: 25 minutes Troy Sine, MD, [facility]  05/05/2017 6:23 PM

## 2017-05-03 NOTE — Patient Instructions (Signed)

## 2017-05-04 DIAGNOSIS — M9901 Segmental and somatic dysfunction of cervical region: Secondary | ICD-10-CM | POA: Diagnosis not present

## 2017-05-04 DIAGNOSIS — M9902 Segmental and somatic dysfunction of thoracic region: Secondary | ICD-10-CM | POA: Diagnosis not present

## 2017-05-04 DIAGNOSIS — M9905 Segmental and somatic dysfunction of pelvic region: Secondary | ICD-10-CM | POA: Diagnosis not present

## 2017-05-04 DIAGNOSIS — M9903 Segmental and somatic dysfunction of lumbar region: Secondary | ICD-10-CM | POA: Diagnosis not present

## 2017-05-04 DIAGNOSIS — M545 Low back pain: Secondary | ICD-10-CM | POA: Diagnosis not present

## 2017-05-04 DIAGNOSIS — M542 Cervicalgia: Secondary | ICD-10-CM | POA: Diagnosis not present

## 2017-05-05 ENCOUNTER — Encounter: Payer: Self-pay | Admitting: Cardiovascular Disease

## 2017-05-11 DIAGNOSIS — M542 Cervicalgia: Secondary | ICD-10-CM | POA: Diagnosis not present

## 2017-05-11 DIAGNOSIS — M545 Low back pain: Secondary | ICD-10-CM | POA: Diagnosis not present

## 2017-05-11 DIAGNOSIS — M9905 Segmental and somatic dysfunction of pelvic region: Secondary | ICD-10-CM | POA: Diagnosis not present

## 2017-05-11 DIAGNOSIS — M9903 Segmental and somatic dysfunction of lumbar region: Secondary | ICD-10-CM | POA: Diagnosis not present

## 2017-05-11 DIAGNOSIS — M9902 Segmental and somatic dysfunction of thoracic region: Secondary | ICD-10-CM | POA: Diagnosis not present

## 2017-05-11 DIAGNOSIS — M9901 Segmental and somatic dysfunction of cervical region: Secondary | ICD-10-CM | POA: Diagnosis not present

## 2017-05-20 DIAGNOSIS — M9903 Segmental and somatic dysfunction of lumbar region: Secondary | ICD-10-CM | POA: Diagnosis not present

## 2017-05-20 DIAGNOSIS — M545 Low back pain: Secondary | ICD-10-CM | POA: Diagnosis not present

## 2017-05-20 DIAGNOSIS — M9902 Segmental and somatic dysfunction of thoracic region: Secondary | ICD-10-CM | POA: Diagnosis not present

## 2017-05-20 DIAGNOSIS — M542 Cervicalgia: Secondary | ICD-10-CM | POA: Diagnosis not present

## 2017-05-20 DIAGNOSIS — M9901 Segmental and somatic dysfunction of cervical region: Secondary | ICD-10-CM | POA: Diagnosis not present

## 2017-05-20 DIAGNOSIS — M9905 Segmental and somatic dysfunction of pelvic region: Secondary | ICD-10-CM | POA: Diagnosis not present

## 2017-06-03 ENCOUNTER — Other Ambulatory Visit: Payer: Self-pay | Admitting: Cardiovascular Disease

## 2017-06-03 NOTE — Telephone Encounter (Signed)
REFILL 

## 2017-06-08 DIAGNOSIS — Z6832 Body mass index (BMI) 32.0-32.9, adult: Secondary | ICD-10-CM | POA: Diagnosis not present

## 2017-06-08 DIAGNOSIS — E669 Obesity, unspecified: Secondary | ICD-10-CM | POA: Diagnosis not present

## 2017-06-08 DIAGNOSIS — I2510 Atherosclerotic heart disease of native coronary artery without angina pectoris: Secondary | ICD-10-CM | POA: Diagnosis not present

## 2017-06-08 DIAGNOSIS — M2550 Pain in unspecified joint: Secondary | ICD-10-CM | POA: Diagnosis not present

## 2017-06-08 DIAGNOSIS — R0600 Dyspnea, unspecified: Secondary | ICD-10-CM | POA: Diagnosis not present

## 2017-06-08 DIAGNOSIS — M5440 Lumbago with sciatica, unspecified side: Secondary | ICD-10-CM | POA: Diagnosis not present

## 2017-06-08 DIAGNOSIS — M0609 Rheumatoid arthritis without rheumatoid factor, multiple sites: Secondary | ICD-10-CM | POA: Diagnosis not present

## 2017-06-08 DIAGNOSIS — J849 Interstitial pulmonary disease, unspecified: Secondary | ICD-10-CM | POA: Diagnosis not present

## 2017-06-27 ENCOUNTER — Encounter: Payer: Self-pay | Admitting: Adult Health

## 2017-06-27 ENCOUNTER — Ambulatory Visit (INDEPENDENT_AMBULATORY_CARE_PROVIDER_SITE_OTHER): Payer: Medicare Other | Admitting: Adult Health

## 2017-06-27 VITALS — BP 120/84 | HR 87 | Ht 73.0 in | Wt 241.4 lb

## 2017-06-27 DIAGNOSIS — J849 Interstitial pulmonary disease, unspecified: Secondary | ICD-10-CM

## 2017-06-27 DIAGNOSIS — E669 Obesity, unspecified: Secondary | ICD-10-CM

## 2017-06-27 DIAGNOSIS — G4733 Obstructive sleep apnea (adult) (pediatric): Secondary | ICD-10-CM

## 2017-06-27 NOTE — Assessment & Plan Note (Signed)
Rheumatoid related ILD.  Clinically patient is doing better on current regimen with CellCept and prednisone.  Continue to taper prednisone as per rheumatology recommendation.  Will check PFT with DLCO on return.  Plan  Patient Instructions  Continue on CPAP At bedtime   Great job with weight loss.  Do not drive if sleepy .  Continue on Cellcept per Rheumatology .  Taper prednisone as directed by Rheumatology .  Follow up with Dr. Elsworth Soho  In 4 months with PFT -Spirometry with DLCO .

## 2017-06-27 NOTE — Assessment & Plan Note (Signed)
Great job with weight loss!

## 2017-06-27 NOTE — Patient Instructions (Addendum)
Continue on CPAP At bedtime   Great job with weight loss.  Do not drive if sleepy .  Continue on Cellcept per Rheumatology .  Taper prednisone as directed by Rheumatology .  Follow up with Dr. Elsworth Soho  In 4 months with PFT -Spirometry with DLCO .

## 2017-06-27 NOTE — Progress Notes (Signed)
@Patient  ID: Richard Simpson, male    DOB: 12/27/1970, 47 y.o.   MRN: [id number]  Chief Complaint  Patient presents with  . Follow-up    OSA    Referring provider: Caryl Bis, MD . 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 This was corrected by CPAP 8 cm with nasal pillows  PFTs 12/2013 >>no obs,FVC 77%, nml DLCO -Mild restriction?effect of obesity  PFT12/2018moderate restriction .FEV1 70%, ratio 86, FVC 63%, DLCO 74%.  06/2013 NSTEMI, PTCA to LAD, EF 45% Rpt echo 04/2015 >> nml LVEF  5/31/2016CT angio chest >>Patchy regions of ground-glass attenuation opacity 71mm &6cm in the medial aspect of the left upper lobe with associated volume loss 09/2014 CT chest >Decrease in size of ground-glass attenuating opacity within the perihilar left upper lobe compatible with a benign, likely infectious or inflammatory process.  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  Patient has known severe sleep apnea on CPAP at bedtime.  Patient says he is doing well.  He feels rested.  Does have some nasal congestion intermittently.  Download shows excellent compliance with average usage at 5 hours.  Patient is on CPAP 8 cm H2O.  AHI is 1.3  Patient has underlying rheumatoid arthritis related interstitial lung disease.  He has been followed by rheumatology.  He is currently on CellCept and prednisone.  He is on a slow prednisone taper.  Currently on 7.5 mg.  He is going down by 2.5 mg every 2  weeks.  Says that his joint pain is being controlled.  Says his breathing has been doing a whole lot better over the last 4 months.  He is able to go to the gym and walk for 20 to 30 minutes each day.  He is been able to lose 40 pounds.  He has been eating healthy.  He denies any increased cough or shortness of breath.   Allergies  Allergen Reactions  . Lisinopril Cough  . Prednisone Other (See Comments)    Makes angry    Immunization History  Administered Date(s) Administered  . Influenza Split 10/25/2016  . Influenza,inj,Quad PF,6+ Mos 11/20/2014  . Influenza-Unspecified 12/09/2013    Past Medical History:  Diagnosis Date  . Anxiety   . CAD S/P percutaneous coronary angioplasty 10/2013   a. 10/2013 Cath: mod LAD dzs->nl FFR-> medically managed;  b. 06/2014 NSTEMI/PCi: LAD 99ost, 70p, 3m (entire area covered with a 3.5x20 Synergy DES), LCX nl, RCA 11m, 10d, EF 45-50%. c. 10/2015: cath showing patent LAD stent with 15% Prox LAD stenosis. EF 55-65%.   . 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 Depression   . Dyslipidemia, goal LDL below 70   . Essential hypertension   . GERD (gastroesophageal reflux disease)   . Headache   . History of kidney stones   . Ischemic cardiomyopathy - Resolved 06/2014   a. 06/2014 EF 45-50% by LV gram b. Echo July 2016: EF 60-65%. No RWMA. Gr 1  DD   . NSTEMI (non-ST elevated myocardial infarction) ([place]) 06/2014  . Obesity (BMI 30-39.9) 10/29/2013  . Primary snoring 10/29/2013  . Rheumatoid arthritis ([place]) 09/23/2013  . RLS (restless legs syndrome) 10/29/2013    Tobacco History: Social History   Tobacco Use  Smoking Status Former Smoker  . Packs/day: 0.25  . Years: 15.00  . Pack years: 3.75  . Types: Cigarettes  . Last attempt to quit: 09/04/2003  . Years since quitting: 13.8  Smokeless Tobacco Current User  . Types: Chew   Ready to quit: Not Answered Counseling given: Not  Answered   Outpatient Encounter Medications as of 06/27/2017  Medication Sig  . amLODipine (NORVASC) 5 MG tablet TAKE 1 TABLET BY MOUTH ONCE A DAY. (Patient taking differently: TAKE 1/2 TABLET BY MOUTH ONCE A DAY.)  . aspirin EC 81 MG tablet Take 1 tablet (81 mg total) by mouth daily.  Marland Kitchen atorvastatin (LIPITOR) 80 MG tablet TAKE (1) TABLET BY MOUTH ONCE DAILY.  . clonazePAM (KLONOPIN) 0.5 MG tablet Take 0.5 mg by mouth 2 (two) times daily as needed for anxiety (takes 1 time daily and 2 nd dose only if needed).   . clopidogrel (PLAVIX) 75 MG tablet TAKE 1 TABLET BY MOUTH ONCE A DAY.  . furosemide (LASIX) 20 MG tablet TAKE (1) TABLET BY MOUTH ONCE DAILY. (Patient taking differently: TAKE (1) TABLET BY MOUTH AS NEEDED)  . isosorbide mononitrate (IMDUR) 30 MG 24 hr tablet Take 1 tablet (30 mg total) by mouth daily.  Marland Kitchen loratadine (CLARITIN) 10 MG tablet Take 10 mg by mouth daily.   Marland Kitchen losartan (COZAAR) 25 MG tablet Take 25 mg  In the morning  . metoprolol tartrate (LOPRESSOR) 25 MG tablet TAKE (1) TABLET BY MOUTH TWICE DAILY.  . mycophenolate (CELLCEPT) 500 MG tablet   . nitroGLYCERIN (NITROSTAT) 0.4 MG SL tablet DISSOLVE 1 TABLET UNDER TONGUE FOR CHEST TIGHTNESS AND SHORTNESS OF BREATH. MAY TAKE EVERY 5 MINUTES UP TO 3 DOSES. IF NO RELIEF AFTER 3 DOS  . predniSONE (DELTASONE) 5 MG tablet Take 5 mg by mouth daily with breakfast.  . ranitidine (ZANTAC) 150 MG capsule Take 150 mg by mouth 2 (two) times daily.   . traMADol (ULTRAM) 50 MG tablet Take 50 mg by mouth every 6 (six) hours as needed for moderate pain.    No facility-administered encounter medications on file as of 06/27/2017.      Review of Systems  Constitutional:   No  weight loss, night sweats,  Fevers, chills,  +fatigue, or  lassitude.  HEENT:   No headaches,  Difficulty swallowing,  Tooth/dental problems, or  Sore throat,                No sneezing, itching, ear ache, nasal congestion, post nasal drip,   CV:  No chest pain,   Orthopnea, PND, swelling in lower extremities, anasarca, dizziness, palpitations, syncope.   GI  No heartburn, indigestion, abdominal pain, nausea, vomiting, diarrhea, change in bowel habits, loss of appetite, bloody stools.   Resp:    No chest wall deformity  Skin: no rash or lesions.  GU: no dysuria, change in color of urine, no urgency or frequency.  No flank pain, no hematuria   MS:  No joint pain or swelling.  No decreased range of motion.  No back pain.    Physical Exam  BP 120/84 (BP Location: Left Arm, Cuff Size: Normal)   Pulse 87   Ht 6\' 1"  (1.854 m)   Wt  241 lb 6.4 oz (109.5 kg)   SpO2 97%   BMI 31.85 kg/m   GEN: A/Ox3; pleasant , NAD    HEENT:  [place]/AT,  EACs-clear, TMs-wnl, NOSE-clear, THROAT-clear, no lesions, no postnasal drip or exudate noted.   NECK:  Supple w/ fair ROM; no JVD; normal carotid impulses w/o bruits; no thyromegaly or nodules palpated; no lymphadenopathy.    RESP  Clear  P & A; w/o, wheezes/ rales/ or rhonchi. no accessory muscle use, no dullness to percussion  CARD:  RRR, no m/r/g, no peripheral edema, pulses intact, no cyanosis or clubbing.  GI:   Soft & nt; nml bowel sounds; no organomegaly or masses detected.   Musco: Warm bil, no deformities or joint swelling noted.   Neuro: alert, no focal deficits noted.    Skin: Warm, no lesions or rashes    Lab Results:  CBC  BMET   BNP No results found for: BNP  Imaging: No results found.   Assessment & Plan:   OSA (obstructive sleep apnea) Well-controlled on current regimen  Plan  Patient Instructions  Continue on CPAP At bedtime   Great job with weight loss.  Do not drive if sleepy .  Continue on Cellcept per Rheumatology .  Taper prednisone as directed by Rheumatology .  Follow up with Dr. Elsworth Soho  In 4 months with PFT -Spirometry with DLCO .       Obesity (BMI 30-39.9) Great job with weight loss    ILD (interstitial lung disease) ([place]) Rheumatoid related ILD.   Clinically patient is doing better on current regimen with CellCept and prednisone.  Continue to taper prednisone as per rheumatology recommendation.  Will check PFT with DLCO on return.  Plan  Patient Instructions  Continue on CPAP At bedtime   Great job with weight loss.  Do not drive if sleepy .  Continue on Cellcept per Rheumatology .  Taper prednisone as directed by Rheumatology .  Follow up with Dr. Elsworth Soho  In 4 months with PFT -Spirometry with DLCO .          Rexene Edison, NP 06/27/2017

## 2017-06-27 NOTE — Assessment & Plan Note (Signed)
Well-controlled on current regimen  Plan  Patient Instructions  Continue on CPAP At bedtime   Great job with weight loss.  Do not drive if sleepy .  Continue on Cellcept per Rheumatology .  Taper prednisone as directed by Rheumatology .  Follow up with Dr. Elsworth Soho  In 4 months with PFT -Spirometry with DLCO .

## 2017-07-06 DIAGNOSIS — M9905 Segmental and somatic dysfunction of pelvic region: Secondary | ICD-10-CM | POA: Diagnosis not present

## 2017-07-06 DIAGNOSIS — M545 Low back pain: Secondary | ICD-10-CM | POA: Diagnosis not present

## 2017-07-06 DIAGNOSIS — M9903 Segmental and somatic dysfunction of lumbar region: Secondary | ICD-10-CM | POA: Diagnosis not present

## 2017-07-06 DIAGNOSIS — M9902 Segmental and somatic dysfunction of thoracic region: Secondary | ICD-10-CM | POA: Diagnosis not present

## 2017-07-08 DIAGNOSIS — M9903 Segmental and somatic dysfunction of lumbar region: Secondary | ICD-10-CM | POA: Diagnosis not present

## 2017-07-08 DIAGNOSIS — M9905 Segmental and somatic dysfunction of pelvic region: Secondary | ICD-10-CM | POA: Diagnosis not present

## 2017-07-08 DIAGNOSIS — M9902 Segmental and somatic dysfunction of thoracic region: Secondary | ICD-10-CM | POA: Diagnosis not present

## 2017-07-08 DIAGNOSIS — M545 Low back pain: Secondary | ICD-10-CM | POA: Diagnosis not present

## 2017-07-11 DIAGNOSIS — M9905 Segmental and somatic dysfunction of pelvic region: Secondary | ICD-10-CM | POA: Diagnosis not present

## 2017-07-11 DIAGNOSIS — M545 Low back pain: Secondary | ICD-10-CM | POA: Diagnosis not present

## 2017-07-11 DIAGNOSIS — M9903 Segmental and somatic dysfunction of lumbar region: Secondary | ICD-10-CM | POA: Diagnosis not present

## 2017-07-11 DIAGNOSIS — M9902 Segmental and somatic dysfunction of thoracic region: Secondary | ICD-10-CM | POA: Diagnosis not present

## 2017-07-11 NOTE — Progress Notes (Signed)
Reviewed & agree with plan  

## 2017-07-13 DIAGNOSIS — M9905 Segmental and somatic dysfunction of pelvic region: Secondary | ICD-10-CM | POA: Diagnosis not present

## 2017-07-13 DIAGNOSIS — M9902 Segmental and somatic dysfunction of thoracic region: Secondary | ICD-10-CM | POA: Diagnosis not present

## 2017-07-13 DIAGNOSIS — M545 Low back pain: Secondary | ICD-10-CM | POA: Diagnosis not present

## 2017-07-13 DIAGNOSIS — M9903 Segmental and somatic dysfunction of lumbar region: Secondary | ICD-10-CM | POA: Diagnosis not present

## 2017-07-18 DIAGNOSIS — M9902 Segmental and somatic dysfunction of thoracic region: Secondary | ICD-10-CM | POA: Diagnosis not present

## 2017-07-18 DIAGNOSIS — M9905 Segmental and somatic dysfunction of pelvic region: Secondary | ICD-10-CM | POA: Diagnosis not present

## 2017-07-18 DIAGNOSIS — M9903 Segmental and somatic dysfunction of lumbar region: Secondary | ICD-10-CM | POA: Diagnosis not present

## 2017-07-18 DIAGNOSIS — M545 Low back pain: Secondary | ICD-10-CM | POA: Diagnosis not present

## 2017-07-20 DIAGNOSIS — M9905 Segmental and somatic dysfunction of pelvic region: Secondary | ICD-10-CM | POA: Diagnosis not present

## 2017-07-20 DIAGNOSIS — M9903 Segmental and somatic dysfunction of lumbar region: Secondary | ICD-10-CM | POA: Diagnosis not present

## 2017-07-20 DIAGNOSIS — M545 Low back pain: Secondary | ICD-10-CM | POA: Diagnosis not present

## 2017-07-20 DIAGNOSIS — M9902 Segmental and somatic dysfunction of thoracic region: Secondary | ICD-10-CM | POA: Diagnosis not present

## 2017-08-01 ENCOUNTER — Other Ambulatory Visit: Payer: Self-pay | Admitting: Cardiovascular Disease

## 2017-08-06 ENCOUNTER — Other Ambulatory Visit: Payer: Self-pay | Admitting: Student

## 2017-08-08 NOTE — Telephone Encounter (Signed)
Rx sent to pharmacy   

## 2017-08-22 ENCOUNTER — Other Ambulatory Visit: Payer: Self-pay | Admitting: Cardiovascular Disease

## 2017-08-23 NOTE — Telephone Encounter (Signed)
der Providers   Prescribing Provider Encounter Provider  Troy Sine, MD Erma Heritage, PA-C  Outpatient Medication Detail    Disp Refills Start End   losartan (COZAAR) 25 MG tablet 135 tablet 0 08/08/2017    Sig: TAKE 1 TABLET BY MOUTH IN THE MORNING.   Sent to pharmacy as: losartan (COZAAR) 25 MG tablet   E-Prescribing Status: Receipt confirmed by pharmacy (08/08/2017 2:42 PM EDT)   Pharmacy   [facility], [place]

## 2017-08-24 DIAGNOSIS — G4733 Obstructive sleep apnea (adult) (pediatric): Secondary | ICD-10-CM | POA: Diagnosis not present

## 2017-08-25 DIAGNOSIS — E669 Obesity, unspecified: Secondary | ICD-10-CM | POA: Diagnosis not present

## 2017-08-25 DIAGNOSIS — Z6833 Body mass index (BMI) 33.0-33.9, adult: Secondary | ICD-10-CM | POA: Diagnosis not present

## 2017-08-25 DIAGNOSIS — I2510 Atherosclerotic heart disease of native coronary artery without angina pectoris: Secondary | ICD-10-CM | POA: Diagnosis not present

## 2017-08-25 DIAGNOSIS — J849 Interstitial pulmonary disease, unspecified: Secondary | ICD-10-CM | POA: Diagnosis not present

## 2017-08-25 DIAGNOSIS — R0600 Dyspnea, unspecified: Secondary | ICD-10-CM | POA: Diagnosis not present

## 2017-08-25 DIAGNOSIS — M2550 Pain in unspecified joint: Secondary | ICD-10-CM | POA: Diagnosis not present

## 2017-08-25 DIAGNOSIS — M0609 Rheumatoid arthritis without rheumatoid factor, multiple sites: Secondary | ICD-10-CM | POA: Diagnosis not present

## 2017-08-25 DIAGNOSIS — M5440 Lumbago with sciatica, unspecified side: Secondary | ICD-10-CM | POA: Diagnosis not present

## 2017-08-31 ENCOUNTER — Other Ambulatory Visit: Payer: Self-pay | Admitting: Cardiovascular Disease

## 2017-09-24 DIAGNOSIS — G4733 Obstructive sleep apnea (adult) (pediatric): Secondary | ICD-10-CM | POA: Diagnosis not present

## 2017-10-03 ENCOUNTER — Other Ambulatory Visit: Payer: Self-pay | Admitting: Cardiovascular Disease

## 2017-10-21 ENCOUNTER — Other Ambulatory Visit: Payer: Self-pay | Admitting: Cardiovascular Disease

## 2017-10-21 NOTE — Telephone Encounter (Signed)
Rx request sent to pharmacy.  

## 2017-10-23 ENCOUNTER — Emergency Department (HOSPITAL_COMMUNITY)
Admission: EM | Admit: 2017-10-23 | Discharge: 2017-10-23 | Disposition: A | Discharge status: Home / Self Care | Payer: PPO | Attending: Emergency Medicine | Admitting: Emergency Medicine

## 2017-10-23 ENCOUNTER — Emergency Department (HOSPITAL_COMMUNITY): Payer: PPO

## 2017-10-23 ENCOUNTER — Other Ambulatory Visit: Payer: Self-pay

## 2017-10-23 ENCOUNTER — Encounter (HOSPITAL_COMMUNITY): Payer: Self-pay | Admitting: Emergency Medicine

## 2017-10-23 DIAGNOSIS — R109 Unspecified abdominal pain: Secondary | ICD-10-CM | POA: Diagnosis present

## 2017-10-23 DIAGNOSIS — I10 Essential (primary) hypertension: Secondary | ICD-10-CM | POA: Insufficient documentation

## 2017-10-23 DIAGNOSIS — R079 Chest pain, unspecified: Secondary | ICD-10-CM

## 2017-10-23 DIAGNOSIS — Z7982 Long term (current) use of aspirin: Secondary | ICD-10-CM | POA: Diagnosis not present

## 2017-10-23 DIAGNOSIS — Z87891 Personal history of nicotine dependence: Secondary | ICD-10-CM | POA: Diagnosis not present

## 2017-10-23 DIAGNOSIS — Z7902 Long term (current) use of antithrombotics/antiplatelets: Secondary | ICD-10-CM | POA: Diagnosis not present

## 2017-10-23 DIAGNOSIS — I2510 Atherosclerotic heart disease of native coronary artery without angina pectoris: Secondary | ICD-10-CM | POA: Diagnosis not present

## 2017-10-23 DIAGNOSIS — I252 Old myocardial infarction: Secondary | ICD-10-CM | POA: Insufficient documentation

## 2017-10-23 DIAGNOSIS — Z79899 Other long term (current) drug therapy: Secondary | ICD-10-CM | POA: Insufficient documentation

## 2017-10-23 DIAGNOSIS — N200 Calculus of kidney: Secondary | ICD-10-CM

## 2017-10-23 DIAGNOSIS — N202 Calculus of kidney with calculus of ureter: Secondary | ICD-10-CM | POA: Diagnosis not present

## 2017-10-23 HISTORY — DX: Interstitial pulmonary disease, unspecified: J84.9

## 2017-10-23 LAB — CBC WITH DIFFERENTIAL/PLATELET
BASOS ABS: 0 10*3/uL (ref 0.0–0.1)
Basophils Relative: 0 %
Eosinophils Absolute: 0 10*3/uL (ref 0.0–0.7)
Eosinophils Relative: 1 %
HEMATOCRIT: 44.5 % (ref 39.0–52.0)
Hemoglobin: 15.2 g/dL (ref 13.0–17.0)
LYMPHS PCT: 20 %
Lymphs Abs: 1 10*3/uL (ref 0.7–4.0)
MCH: 32.6 pg (ref 26.0–34.0)
MCHC: 34.2 g/dL (ref 30.0–36.0)
MCV: 95.5 fL (ref 78.0–100.0)
MONO ABS: 0.5 10*3/uL (ref 0.1–1.0)
Monocytes Relative: 10 %
NEUTROS ABS: 3.6 10*3/uL (ref 1.7–7.7)
Neutrophils Relative %: 69 %
PLATELETS: 143 10*3/uL — AB (ref 150–400)
RBC: 4.66 MIL/uL (ref 4.22–5.81)
RDW: 12.8 % (ref 11.5–15.5)
WBC: 5.2 10*3/uL (ref 4.0–10.5)

## 2017-10-23 LAB — URINALYSIS, ROUTINE W REFLEX MICROSCOPIC
BACTERIA UA: NONE SEEN
Bilirubin Urine: NEGATIVE
Glucose, UA: NEGATIVE mg/dL
KETONES UR: NEGATIVE mg/dL
LEUKOCYTES UA: NEGATIVE
Nitrite: NEGATIVE
PROTEIN: NEGATIVE mg/dL
Specific Gravity, Urine: 1.018 (ref 1.005–1.030)
pH: 5 (ref 5.0–8.0)

## 2017-10-23 LAB — BASIC METABOLIC PANEL
Anion gap: 6 (ref 5–15)
BUN: 16 mg/dL (ref 6–20)
CALCIUM: 8.7 mg/dL — AB (ref 8.9–10.3)
CO2: 25 mmol/L (ref 22–32)
CREATININE: 0.89 mg/dL (ref 0.61–1.24)
Chloride: 108 mmol/L (ref 98–111)
GFR calc Af Amer: >60 mL/min (ref >60)
GLUCOSE: 92 mg/dL (ref 70–99)
Potassium: 3.7 mmol/L (ref 3.5–5.1)
Sodium: 139 mmol/L (ref 135–145)

## 2017-10-23 LAB — TROPONIN I: Troponin I: <0.03 ng/mL (ref <0.03)

## 2017-10-23 MED ORDER — OXYCODONE-ACETAMINOPHEN 5-325 MG PO TABS: 1.0000 | ORAL_TABLET | ORAL | 0 refills | Status: DC | PRN

## 2017-10-23 MED ORDER — SODIUM CHLORIDE 0.9 % IV BOLUS
500.0000 mL | Freq: Once | INTRAVENOUS | Status: AC
  Administered 2017-10-23: 500 mL via INTRAVENOUS

## 2017-10-23 MED ORDER — ONDANSETRON HCL 4 MG PO TABS: 4.0000 mg | ORAL_TABLET | Freq: Four times a day (QID) | ORAL | 0 refills | Status: DC

## 2017-10-23 MED ORDER — MORPHINE SULFATE (PF) 4 MG/ML IV SOLN
4.0000 mg | Freq: Once | INTRAVENOUS | Status: AC
  Administered 2017-10-23: 4 mg via INTRAVENOUS
  Filled 2017-10-23: qty 1

## 2017-10-23 MED ORDER — ONDANSETRON HCL 4 MG/2ML IJ SOLN
4.0000 mg | Freq: Once | INTRAMUSCULAR | Status: AC
  Administered 2017-10-23: 4 mg via INTRAVENOUS
  Filled 2017-10-23: qty 2

## 2017-10-23 MED ORDER — NITROGLYCERIN 0.4 MG SL SUBL
SUBLINGUAL_TABLET | SUBLINGUAL | Status: AC
  Filled 2017-10-23: qty 1

## 2017-10-23 MED ORDER — NITROGLYCERIN 0.4 MG SL SUBL
0.4000 mg | SUBLINGUAL_TABLET | Freq: Once | SUBLINGUAL | Status: AC
  Administered 2017-10-23: 0.4 mg via SUBLINGUAL

## 2017-10-23 MED ORDER — TAMSULOSIN HCL 0.4 MG PO CAPS: 0.4000 mg | ORAL_CAPSULE | Freq: Every day | ORAL | 0 refills | Status: DC

## 2017-10-23 MED ORDER — KETOROLAC TROMETHAMINE 30 MG/ML IJ SOLN
30.0000 mg | Freq: Once | INTRAMUSCULAR | Status: AC
  Administered 2017-10-23: 30 mg via INTRAVENOUS
  Filled 2017-10-23: qty 1

## 2017-10-23 NOTE — ED Provider Notes (Signed)
[facility] EMERGENCY DEPARTMENT Provider Note   CSN: [id number] Arrival date & time: 10/23/17  0645     History   Chief Complaint Chief Complaint  Patient presents with  . Flank Pain    HPI Richard Simpson is a 46 y.o. male.  Patient presents with left flank pain radiating to the left genitalia since 1500 Saturday.  He has a known history of kidney stones.  Pain feels similar. Normal urinary output.  No hematuria, fever, sweats, chills.  Past medical history includes MI in 2016, ischemic cardiomyopathy, hypertension, dyslipidemia, rheumatoid arthritis, many others.  Severity of pain is moderate.  Nothing makes symptoms better or worse.     Past Medical History:  Diagnosis Date  . Anxiety   . CAD S/P percutaneous coronary angioplasty 10/2013   a. 10/2013 Cath: mod LAD dzs->nl FFR-> medically managed;  b. 06/2014 NSTEMI/PCi: LAD 99ost, 70p, 20m (entire area covered with a 3.5x20 Synergy DES), LCX nl, RCA 48m, 10d, EF 45-50%. c. 10/2015: cath showing patent LAD stent with 15% Prox LAD stenosis. EF 55-65%.   . DEGENERATIVE [place] DISEASE, THORACIC SPINE 09/16/2009   Qualifier: Diagnosis of  By: [name] MD, [name]    . Depression   . Dyslipidemia, goal LDL below 70   . Essential hypertension   . GERD (gastroesophageal reflux disease)   . Headache   . History of kidney stones   . Interstitial lung disease ([place])   . Ischemic cardiomyopathy - Resolved 06/2014   a. 06/2014 EF 45-50% by LV gram b. Echo July 2016: EF 60-65%. No RWMA. Gr 1 DD   . NSTEMI (non-ST elevated myocardial infarction) ([place]) 06/2014  . Obesity (BMI 30-39.9) 10/29/2013  . Primary snoring 10/29/2013  . Rheumatoid arthritis ([place]) 09/23/2013  . RLS (restless legs syndrome) 10/29/2013    Patient Active Problem List   Diagnosis Date Noted  . OSA (obstructive sleep apnea) 02/08/2017  . ILD (interstitial lung disease) ([place]) 02/08/2017  . Daytime sleepiness 01/11/2017  . Chest pain on exertion 07/19/2015  . Angina,  class III ([place]) 03/21/2015  . Hyperlipidemia LDL goal <70   . Abnormal CT scan, chest 02/24/2015  . CAD S/P percutaneous coronary angioplasty   . Coronary artery disease involving native coronary artery of native heart with angina pectoris ([place])   . Dyspnea 07/04/2014  . H/o NSTEMI 07/04/2014  . Fatigue 12/18/2013  . Essential hypertension   . RLS (restless legs syndrome) 10/29/2013  . Primary snoring 10/29/2013  . Obesity (BMI 30-39.9) 10/29/2013  . Rheumatoid arthritis ([place]) 09/23/2013  . GERD (gastroesophageal reflux disease) 09/23/2013  . Mass of finger of left hand 09/02/2010  . THORACIC DISC DISPLACEMENT 09/16/2009  . 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 BACK PAIN 09/16/2009    Past Surgical History:  Procedure Laterality Date  . CARDIAC CATHETERIZATION N/A 07/05/2014   Procedure: Left Heart Cath and Coronary Angiography;  Surgeon: Troy Sine, MD;  Location: [place] [facility];  Service: Cardiovascular;  NSTEMI --> Ostial LAd 99%, prox 80% & early mid 50% --> PCI  . CARDIAC CATHETERIZATION  07/05/2014   Procedure: Coronary Stent Intervention;  Surgeon: Troy Sine, MD;  Location: [place] [facility];  Service: Cardiovascular; Ostial-prox LAD tandem 99, 80 & 50% --> Synergy DES 3.5 x 20 (3.74)  . CARDIAC CATHETERIZATION N/A 03/24/2015   Procedure: Left Heart Cath and Coronary Angiography;  Surgeon: Leonie Man, MD;  Location: [place] [facility];  Service: Cardiovascular;  Laterality: N/A;  .  CARDIAC CATHETERIZATION N/A 11/20/2015   Procedure: Left Heart Cath and Coronary Angiography;  Surgeon: Troy Sine, MD;  Location: [place] [facility];  Service: Cardiovascular;  Laterality: N/A;  . CHOLECYSTECTOMY     10 yrs ago-jenkins  . COLONOSCOPY N/A 03/23/2013   Procedure: COLONOSCOPY;  Surgeon: Daneil Dolin, MD;  Location: [facility];  Service: Endoscopy;  Laterality: N/A;  2:15-moved to 1030 Staff notified pt  . COLONOSCOPY WITH PROPOFOL N/A  06/10/2016   Procedure: COLONOSCOPY WITH PROPOFOL;  Surgeon: Daneil Dolin, MD;  [facility]: [facility];  Service: Endoscopy;  Laterality: N/A;  730   . FRACTIONAL FLOW RESERVE WIRE  11/23/2013   Procedure: FRACTIONAL FLOW RESERVE WIRE;  Surgeon: Jettie Booze, MD;  Location: [facility] CATH LAB;  Service: Cardiovascular;;  . GALLBLADDER SURGERY    . LEFT HEART CATHETERIZATION WITH CORONARY ANGIOGRAM N/A 11/23/2013   Procedure: LEFT HEART CATHETERIZATION WITH CORONARY ANGIOGRAM;  Surgeon: Jettie Booze, MD;  Location: [facility] CATH LAB;  Service: Cardiovascular;  Laterality: N/A;  . MASS EXCISION  09/09/2010   Procedure: EXCISION MASS;  Surgeon: Arther Abbott, MD;  Location: [facility];  Service: Orthopedics;  Laterality: Left;  Excision Mass Left Long Finger  . POLYPECTOMY  06/10/2016   Procedure: POLYPECTOMY;  Surgeon: Daneil Dolin, MD;  [facility];  Service: Endoscopy;;  colon  . THROAT SURGERY     nodules removed from throat as child  . TRANSTHORACIC ECHOCARDIOGRAM   July 2016:    EF 60-65%. No RWMA. Gr 1 DD        Home Medications    Prior to Admission medications   Medication Sig Start Date End Date Taking? Authorizing Provider  amLODipine (NORVASC) 5 MG tablet TAKE 1/2 TABLET BY MOUTH ONCE A DAY. 10/21/17  Yes Troy Sine, MD  aspirin EC 81 MG tablet Take 1 tablet (81 mg total) by mouth daily. 12/04/13  Yes Burtis Junes, NP  atorvastatin (LIPITOR) 80 MG tablet TAKE (1) TABLET BY MOUTH ONCE DAILY. 02/15/17  Yes Troy Sine, MD  clonazePAM (KLONOPIN) 0.5 MG tablet Take 0.5 mg by mouth 2 (two) times daily as needed for anxiety (takes 1 time daily and 2 nd dose only if needed).    Yes [provider]  clopidogrel (PLAVIX) 75 MG tablet TAKE 1 TABLET BY MOUTH ONCE A DAY. 10/03/17  Yes Troy Sine, MD  furosemide (LASIX) 20 MG tablet TAKE (1) TABLET BY MOUTH ONCE DAILY. 01/21/17  Yes Troy Sine, MD  loratadine (CLARITIN) 10 MG tablet Take 10 mg by  mouth daily.    Yes [provider]  losartan (COZAAR) 25 MG tablet TAKE 1 TABLET BY MOUTH IN THE MORNING. 08/08/17  Yes Troy Sine, MD  metoprolol tartrate (LOPRESSOR) 25 MG tablet TAKE (1) TABLET BY MOUTH TWICE DAILY. 06/03/17  Yes Troy Sine, MD  mycophenolate (CELLCEPT) 500 MG tablet  04/28/17  Yes [provider]  nitroGLYCERIN (NITROSTAT) 0.4 MG SL tablet DISSOLVE 1 TABLET UNDER TONGUE FOR CHEST TIGHTNESS AND SHORTNESS OF BREATH. MAY TAKE EVERY 5 MINUTES UP TO 3 DOSES. IF NO RELIEF AFTER 3 DOS 12/28/16  Yes Almyra Deforest, PA  predniSONE (DELTASONE) 5 MG tablet Take 5 mg by mouth daily with breakfast.   Yes [provider]  traMADol (ULTRAM) 50 MG tablet Take 50 mg by mouth every 6 (six) hours as needed for moderate pain.    Yes [provider]  isosorbide  mononitrate (IMDUR) 30 MG 24 hr tablet Take 1 tablet (30 mg total) by mouth daily. Patient not taking: Reported on 10/23/2017 12/28/16   Almyra Deforest, PA  ondansetron (ZOFRAN) 4 MG tablet Take 1 tablet (4 mg total) by mouth every 6 (six) hours. 10/23/17   Nat Christen, MD  oxyCODONE-acetaminophen (PERCOCET) 5-325 MG tablet Take 1 tablet by mouth every 4 (four) hours as needed. 10/23/17   Nat Christen, MD  ranitidine (ZANTAC) 150 MG capsule Take 150 mg by mouth 2 (two) times daily.     [provider]  tamsulosin (FLOMAX) 0.4 MG CAPS capsule Take 1 capsule (0.4 mg total) by mouth daily. 10/23/17   Nat Christen, MD    Family History Family History  Problem Relation Age of Onset  . Hypertension Mother   . Diabetes Mother   . Arthritis Paternal Uncle   . Cancer Maternal Grandfather   . Hypertension Maternal Grandmother   . Diabetes Maternal Grandmother   . Diabetes Unknown   . Diabetes Unknown   . Diabetes Paternal Grandfather   . Anesthesia problems Neg Hx   . Hypotension Neg Hx   . Malignant hyperthermia Neg Hx   . Pseudochol deficiency Neg Hx   . Heart attack Neg Hx   . Stroke Neg Hx      Social History Social History   Tobacco Use  . Smoking status: Former Smoker    Packs/day: 0.25    Years: 15.00    Pack years: 3.75    Types: Cigarettes    Last attempt to quit: 09/04/2003    Years since quitting: 14.1  . Smokeless tobacco: Current User    Types: Chew  Substance Use Topics  . Alcohol use: Yes    Alcohol/week: 1.0 - 2.0 standard drinks    Types: 1 - 2 Cans of beer per week    Comment: rare  . Drug use: No     Allergies   Lisinopril and Prednisone   Review of Systems Review of Systems  All other systems reviewed and are negative.    Physical Exam Updated Vital Signs BP 112/84   Pulse (!) 50   Temp (!) 97.5 F (36.4 C) (Oral)   Resp 16   Ht 6\' 1"  (1.854 m)   Wt 108.9 kg   SpO2 94%   BMI 31.66 kg/m   Physical Exam  Constitutional: He is oriented to person, place, and time. He appears well-developed and well-nourished.  HENT:  Head: Normocephalic and atraumatic.  Eyes: Conjunctivae are normal.  Neck: Neck supple.  Cardiovascular: Normal rate and regular rhythm.  Pulmonary/Chest: Effort normal and breath sounds normal.  Abdominal: Soft. Bowel sounds are normal.  Genitourinary:  Genitourinary Comments: Minimal left flank tenderness.  Musculoskeletal: Normal range of motion.  Neurological: He is alert and oriented to person, place, and time. Sensory deficit:    Skin: Skin is warm and dry.  Psychiatric: He has a normal mood and affect. His behavior is normal.  Nursing note and vitals reviewed.    ED Treatments / Results  Labs (all labs ordered are listed, but only abnormal results are displayed) Labs Reviewed  CBC WITH DIFFERENTIAL/PLATELET - Abnormal; Notable for the following components:      Result Value   Platelets 143 (*)    All other components within normal limits  BASIC METABOLIC PANEL - Abnormal; Notable for the following components:   Calcium 8.7 (*)    All other components within normal limits  URINALYSIS, ROUTINE W  REFLEX MICROSCOPIC - Abnormal; Notable for the following components:   Hgb urine dipstick LARGE (*)    RBC / HPF >50 (*)    All other components within normal limits  TROPONIN I  TROPONIN I    EKG EKG Interpretation  Date/Time:  Sunday October 23 2017 09:32:13 EDT Ventricular Rate:  52 PR Interval:    QRS Duration: 98 QT Interval:  429 QTC Calculation: 399 R Axis:   63 Text Interpretation:  Sinus rhythm Low voltage, precordial leads Confirmed by Nat Christen [phone]) on 10/23/2017 10:08:26 AM   Radiology Dg Chest Port 1 View  Result Date: 10/23/2017 CLINICAL DATA:  Chest pain. EXAM: PORTABLE CHEST 1 VIEW COMPARISON:  None. FINDINGS: The heart size and mediastinal contours are within normal limits. Both lungs are clear. The visualized skeletal structures are unremarkable. IMPRESSION: No active disease. Electronically Signed   By: Kathreen Devoid   On: 10/23/2017 10:12   Ct Renal Stone Study  Result Date: 10/23/2017 CLINICAL DATA:  Left flank region pain EXAM: CT ABDOMEN AND PELVIS WITHOUT CONTRAST TECHNIQUE: Multidetector CT imaging of the abdomen and pelvis was performed following the standard protocol without oral or IV contrast. COMPARISON:  None. FINDINGS: Lower chest: There is bibasilar atelectatic change. No lung base edema or consolidation. Hepatobiliary: No focal liver lesions are appreciable on this noncontrast enhanced study. Gallbladder is absent. There is no evident biliary duct dilatation. Pancreas: No pancreatic mass or inflammatory focus. Spleen: No splenic lesions are appreciable on this noncontrast enhanced study. Adrenals/Urinary Tract: Adrenals bilaterally appear normal. Kidneys bilaterally show no evident mass. There is moderate hydronephrosis on the left. There is a 4 mm calculus in the upper to mid right kidney. There is a 4 x 2 mm calculus in the lower pole left kidney. There is a 3 mm calculus in the left ureter at the L4-5 level. No other ureteral calculi are evident.  There are several phleboliths in the pelvis near but separate from distal ureters. Urinary bladder is midline with wall thickness within normal limits. Stomach/Bowel: Most small bowel loops are fluid filled. There is moderate stool in the colon. There is equivocal wall thickening in multiple loops of small bowel. There is no evident small bowel obstruction. No appreciable free air or portal venous air. Vascular/Lymphatic: There is aortic atherosclerosis. No aneurysm evident. Major mesenteric arterial vessels appear patent. No adenopathy is appreciable in the abdomen or pelvis. Reproductive: Prostate and seminal vesicles are normal in size and contour. There is calcification in the leftward aspect of the prostate. No pelvic masses evident. Other: Appendix appears normal. No abscess or ascites is evident in the abdomen or pelvis. There is mild scarring in the region of the umbilicus. Musculoskeletal: There are no blastic or lytic bone lesions. There is fatty infiltration within the left sartorius muscle at the level of the left femoral neck. No intramuscular mass lesion beyond the fatty infiltration on the left. IMPRESSION: 1. 3 mm calculus in the left ureter at the L4-5 level with hydronephrosis, moderate, on the left. 2. Nonobstructing intrarenal calculi on each side. Prostatic calculi noted. 3. Fluid throughout most of the small bowel with mild generalized small bowel wall thickening. Suspect enteritis or a degree of ileus. No bowel obstruction. No evident abscess in the abdomen pelvis. Appendix appears normal. 4.  Aortic atherosclerosis. 5.  Gallbladder absent. Aortic Atherosclerosis (ICD10-I70.0). Electronically Signed   By: Lowella Grip III M.D.   On: 10/23/2017 08:57    Procedures Procedures (including critical care time)  Medications Ordered in ED Medications  sodium chloride 0.9 % bolus 500 mL (0 mLs Intravenous Stopped 10/23/17 0910)  morphine 4 MG/ML injection 4 mg (4 mg Intravenous Given  10/23/17 0807)  ketorolac (TORADOL) 30 MG/ML injection 30 mg (30 mg Intravenous Given 10/23/17 0807)  ondansetron (ZOFRAN) injection 4 mg (4 mg Intravenous Given 10/23/17 0806)  morphine 4 MG/ML injection 4 mg (4 mg Intravenous Given 10/23/17 0920)  morphine 4 MG/ML injection 4 mg (4 mg Intravenous Given 10/23/17 1126)  nitroGLYCERIN (NITROSTAT) SL tablet 0.4 mg (0.4 mg Sublingual Given 10/23/17 1125)     Initial Impression / Assessment and Plan / ED Course  I have reviewed the triage vital signs and the nursing notes.  Pertinent labs & imaging results that were available during my care of the patient were reviewed by me and considered in my medical decision making (see chart for details).     Patient presents with left flank pain.  CT renal reveals a 3 mm stone at approximately L4-5 area of the ureter.  Pain management in the ED.   During his ED course, patient complained of chest pain without dyspnea.  EKG and delta troponins negative.  One nitroglycerin tablet was given which seemed to help.   Patient was observed for several hours with no change in his status.  Discussed with patient and his wife.  Discharge medications Percocet, Zofran 4 mg, Flomax 0.4 mg.  He will follow-up with cardiology this week.   CRITICAL CARE Performed by: Nat Christen Total critical care time: 35 minutes Critical care time was exclusive of separately billable procedures and treating other patients. Critical care was necessary to treat or prevent imminent or life-threatening deterioration. Critical care was time spent personally by me on the following activities: development of treatment plan with patient and/or surrogate as well as nursing, discussions with consultants, evaluation of patient's response to treatment, examination of patient, obtaining history from patient or surrogate, ordering and performing treatments and interventions, ordering and review of laboratory studies, ordering and review of radiographic  studies, pulse oximetry and re-evaluation of patient's condition.  Final Clinical Impressions(s) / ED Diagnoses   Final diagnoses:  Kidney stone on left side  Chest pain, unspecified type    ED Discharge Orders         Ordered    oxyCODONE-acetaminophen (PERCOCET) 5-325 MG tablet  Every 4 hours PRN     10/23/17 1419    ondansetron (ZOFRAN) 4 MG tablet  Every 6 hours     10/23/17 1419    tamsulosin (FLOMAX) 0.4 MG CAPS capsule  Daily     10/23/17 1419           Nat Christen, MD 10/23/17 1528

## 2017-10-23 NOTE — Discharge Instructions (Signed)
You have a 3 mm kidney stone.  This should pass through your urinary system.  Prescription for pain medicine, nausea medicine, and medication to increase the flow of your urine.  Recommend follow-up with cardiologist for your chest pain.  Take nitroglycerin as needed.  If kidney stone pain does not improve, you should see the urologist.  Phone number given.

## 2017-10-23 NOTE — ED Notes (Signed)
Call to lab Re: trop

## 2017-10-23 NOTE — ED Notes (Signed)
PT reports intermittent left sided chest pain that just now started. EDP made aware with new orders placed.

## 2017-10-23 NOTE — ED Notes (Signed)
Pt complains of CP 6/10 repeat EKG, Dr C received, med orders

## 2017-10-23 NOTE — ED Triage Notes (Signed)
Pt reports left flank pain since around 3am.  Passed a kidney stone a few weeks ago, but pain is much worse this time.

## 2017-10-24 DIAGNOSIS — G4733 Obstructive sleep apnea (adult) (pediatric): Secondary | ICD-10-CM | POA: Diagnosis not present

## 2017-11-01 NOTE — Progress Notes (Signed)
Cardiology Office Note:    Date:  11/03/2017   ID:  Richard Simpson, DOB [birth year], MRN [id number]  PCP:  Caryl Bis, MD  Cardiologist:  [name], MD   Referring MD: Caryl Bis, MD   Chief Complaint  Patient presents with  . Follow-up    CAD    History of Present Illness:    Richard Simpson is a 47 y.o. male with a hx of CAD s/p DES to LAD ostium 99% stenosis followed by 70% and 50% tandem lesions 07/05/14. Follow up echo with normal EF and grade 1 DD. He underwent repeat heart cath 03/24/15 for recurrent chest pain and found to have widely patent LAD stent. Etiology thought to be microvascular disease vs diastolic dysfunction mediated wall stress. He underwent cardiopulmonary exercise test 10/09/15 that was essentially normal. He had another heart cath 11/19/16 for chest pain that again showed widely patent LAD and no significant residual disease. Chest pain was noted to be nitrate responsive. Follow up echo 12/2016 was stable. He was last seen in clinic by Dr. Claiborne Billings on 05/03/17. He was doing well at that time and was following with pulmonology for interstitial lung disease. He also has RA treated with steroids, remicade and now cellcept.  He was doing well on imdur, norvasc, lopressor and a low dose of losartan. He was maintained on ASA and plavix.   He returns today for 6 month follow up.  He was just hospitalized with kidney stones and passed stones this morning.  He had some mild chest pain while in the ER.  He ruled out with 2- troponins.  He walks 30 minutes 4-5 times each week.  He is following with pulmonology for his lung issues.  If he walks longer than 30 minutes he starts to become short of breath and this is generally accompanied with some chest heaviness.  This is baseline for him.  He does not take Imdur any longer because it was decreasing his blood pressure.  He takes 1 Lasix approximately 1 time each week.  He admits that his Lasix usage generally follows dietary  indiscretion.  Overall, he is following a low-sodium diet.  He has lost about 40 pounds since the beginning of the year.  He is not concerned about his heart today.  He will let us know if he starts getting chest pain more regularly.  We may be able to DC Norvasc and add back Imdur.   Past Medical History:  Diagnosis Date  . Anxiety   . CAD S/P percutaneous coronary angioplasty 10/2013   a. 10/2013 Cath: mod LAD dzs->nl FFR-> medically managed;  b. 06/2014 NSTEMI/PCi: LAD 99ost, 70p, 46m (entire area covered with a 3.5x20 Synergy DES), LCX nl, RCA 88m, 10d, EF 45-50%. c. 10/2015: cath showing patent LAD stent with 15% Prox LAD stenosis. EF 55-65%.   . DEGENERATIVE [place] DISEASE, THORACIC SPINE 09/16/2009   Qualifier: Diagnosis of  By: [name] MD, [name]    . Depression   . Dyslipidemia, goal LDL below 70   . Essential hypertension   . GERD (gastroesophageal reflux disease)   . Headache   . History of kidney stones   . Interstitial lung disease ([place])   . Ischemic cardiomyopathy - Resolved 06/2014   a. 06/2014 EF 45-50% by LV gram b. Echo July 2016: EF 60-65%. No RWMA. Gr 1 DD   . NSTEMI (non-ST elevated myocardial infarction) ([place]) 06/2014  . Obesity (BMI 30-39.9) 10/29/2013  . Primary snoring 10/29/2013  .  Rheumatoid arthritis ([place]) 09/23/2013  . RLS (restless legs syndrome) 10/29/2013    Past Surgical History:  Procedure Laterality Date  . CARDIAC CATHETERIZATION N/A 07/05/2014   Procedure: Left Heart Cath and Coronary Angiography;  Surgeon: Troy Sine, MD;  Location: [place] [facility];  Service: Cardiovascular;  NSTEMI --> Ostial LAd 99%, prox 80% & early mid 50% --> PCI  . CARDIAC CATHETERIZATION  07/05/2014   Procedure: Coronary Stent Intervention;  Surgeon: Troy Sine, MD;  Location: [place] [facility];  Service: Cardiovascular; Ostial-prox LAD tandem 99, 80 & 50% --> Synergy DES 3.5 x 20 (3.74)  . CARDIAC CATHETERIZATION N/A 03/24/2015   Procedure: Left Heart Cath and Coronary  Angiography;  Surgeon: Leonie Man, MD;  Location: [place] [facility];  Service: Cardiovascular;  Laterality: N/A;  . CARDIAC CATHETERIZATION N/A 11/20/2015   Procedure: Left Heart Cath and Coronary Angiography;  Surgeon: Troy Sine, MD;  Location: [place];  Service: Cardiovascular;  Laterality: N/A;  . CHOLECYSTECTOMY     10 yrs ago-jenkins  . COLONOSCOPY N/A 03/23/2013   Procedure: COLONOSCOPY;  Surgeon: Daneil Dolin, MD;  [facility];  Service: Endoscopy;  Laterality: N/A;  2:15-moved to 1030 Staff notified pt  . COLONOSCOPY WITH PROPOFOL N/A 06/10/2016   Procedure: COLONOSCOPY WITH PROPOFOL;  Surgeon: Daneil Dolin, MD;  [facility];  Service: Endoscopy;  Laterality: N/A;  730   . FRACTIONAL FLOW RESERVE WIRE  11/23/2013   Procedure: FRACTIONAL FLOW RESERVE WIRE;  Surgeon: Jettie Booze, MD;  Location: [facility];  Service: Cardiovascular;;  . GALLBLADDER SURGERY    . LEFT HEART CATHETERIZATION WITH CORONARY ANGIOGRAM N/A 11/23/2013   Procedure: LEFT HEART CATHETERIZATION WITH CORONARY ANGIOGRAM;  Surgeon: Jettie Booze, MD;  Location: [facility] CATH LAB;  Service: Cardiovascular;  Laterality: N/A;  . MASS EXCISION  09/09/2010   Procedure: EXCISION MASS;  Surgeon: Arther Abbott, MD;  Location: [facility];  Service: Orthopedics;  Laterality: Left;  Excision Mass Left Long Finger  . POLYPECTOMY  06/10/2016   Procedure: POLYPECTOMY;  Surgeon: Daneil Dolin, MD;  [facility];  Service: Endoscopy;;  colon  . THROAT SURGERY     nodules removed from throat as child  . TRANSTHORACIC ECHOCARDIOGRAM   July 2016:    EF 60-65%. No RWMA. Gr 1 DD    Current Medications: Current Meds  Medication Sig  . amLODipine (NORVASC) 5 MG tablet TAKE 1/2 TABLET BY MOUTH ONCE A DAY.  Marland Kitchen aspirin EC 81 MG tablet Take 1 tablet (81 mg total) by mouth daily.  Marland Kitchen atorvastatin (LIPITOR) 80 MG tablet TAKE (1) TABLET BY MOUTH ONCE DAILY.  . clonazePAM  (KLONOPIN) 0.5 MG tablet Take 0.5 mg by mouth 2 (two) times daily as needed for anxiety (takes 1 time daily and 2 nd dose only if needed).   . clopidogrel (PLAVIX) 75 MG tablet TAKE 1 TABLET BY MOUTH ONCE A DAY.  Marland Kitchen loratadine (CLARITIN) 10 MG tablet Take 10 mg by mouth daily.   Marland Kitchen losartan (COZAAR) 25 MG tablet TAKE 1 TABLET BY MOUTH IN THE MORNING.  . metoprolol tartrate (LOPRESSOR) 25 MG tablet TAKE (1) TABLET BY MOUTH TWICE DAILY.  . mycophenolate (CELLCEPT) 500 MG tablet 2 (two) times daily.   . nitroGLYCERIN (NITROSTAT) 0.4 MG SL tablet DISSOLVE 1 TABLET UNDER TONGUE FOR CHEST TIGHTNESS AND SHORTNESS OF BREATH. MAY TAKE EVERY 5 MINUTES UP TO 3 DOSES. IF NO RELIEF  AFTER 3 DOS  . ondansetron (ZOFRAN) 4 MG tablet Take 1 tablet (4 mg total) by mouth every 6 (six) hours.  Marland Kitchen oxyCODONE-acetaminophen (PERCOCET) 5-325 MG tablet Take 1 tablet by mouth every 4 (four) hours as needed.  . predniSONE (DELTASONE) 5 MG tablet Take 5 mg by mouth daily with breakfast.  . ranitidine (ZANTAC) 150 MG capsule Take 150 mg by mouth 2 (two) times daily.   . tamsulosin (FLOMAX) 0.4 MG CAPS capsule Take 1 capsule (0.4 mg total) by mouth daily.  . traMADol (ULTRAM) 50 MG tablet Take 50 mg by mouth every 6 (six) hours as needed for moderate pain.      Allergies:   Lisinopril and Prednisone   Social History   Socioeconomic History  . Marital status: Married    Spouse name: [name]  . Number of children: 2  . Years of education: 12th grade  . Highest education level: Not on file  Occupational History  . Occupation: Retired Chemical engineer: [employer]  Social Needs  . Financial resource strain: Not on file  . Food insecurity:    Worry: Not on file    Inability: Not on file  . Transportation needs:    Medical: Not on file    Non-medical: Not on file  Tobacco Use  . Smoking status: Former Smoker    Packs/day: 0.25    Years: 15.00    Pack years: 3.75    Types: Cigarettes    Last  attempt to quit: 09/04/2003    Years since quitting: 14.1  . Smokeless tobacco: Current User    Types: Chew  Substance and Sexual Activity  . Alcohol use: Yes    Alcohol/week: 1.0 - 2.0 standard drinks    Types: 1 - 2 Cans of beer per week    Comment: rare  . Drug use: No  . Sexual activity: Yes  Lifestyle  . Physical activity:    Days per week: Not on file    Minutes per session: Not on file  . Stress: Not on file  Relationships  . Social connections:    Talks on phone: Not on file    Gets together: Not on file    Attends religious service: Not on file    Active member of club or organization: Not on file    Attends meetings of clubs or organizations: Not on file    Relationship status: Not on file  Other Topics Concern  . Not on file  Social History Narrative   Patient lives at home with his wife [name]).    Patient works full time Dentist..   Education high school.   Right handed.   Caffeine one cup of coffee daily and one soda.     Family History: The patient's family history includes Arthritis in his paternal uncle; Cancer in his maternal grandfather; Diabetes in his maternal grandmother, mother, paternal grandfather, unknown relative, and unknown relative; Hypertension in his maternal grandmother and mother. There is no history of Anesthesia problems, Hypotension, Malignant hyperthermia, Pseudochol deficiency, Heart attack, or Stroke.  ROS:   Please see the history of present illness.     All other systems reviewed and are negative.  EKGs/Labs/Other Studies Reviewed:    The following studies were reviewed today:  Echo 01/06/17: Study Conclusions  - Left ventricle: The cavity size was normal. Wall thickness was   increased in a pattern of moderate LVH. There was focal basal   hypertrophy.  Systolic function was normal. The estimated ejection   fraction was in the range of 50% to 55%. Severe hypokinesis of   the apicalanteroseptal myocardium. Doppler  parameters are   consistent with abnormal left ventricular relaxation (grade 1   diastolic dysfunction). - Aortic valve: There was trivial regurgitation.  EKG:  EKG is not ordered today.   Recent Labs: 10/23/2017: BUN 16; Creatinine, Ser 0.89; Hemoglobin 15.2; Platelets 143; Potassium 3.7; Sodium 139  Recent Lipid Panel    Component Value Date/Time   CHOL 103 04/16/2015 0820   CHOL 114 08/06/2014 0746   TRIG 70 04/16/2015 0820   HDL 28 (L) 04/16/2015 0820   HDL 28 (L) 08/06/2014 0746   CHOLHDL 3.7 04/16/2015 0820   VLDL 14 04/16/2015 0820   LDLCALC 61 04/16/2015 0820   LDLCALC 63 08/06/2014 0746    Physical Exam:    VS:  BP 122/90   Pulse (!) 54   Ht 6\' 1"  (1.854 m)   Wt 249 lb (112.9 kg)   SpO2 96%   BMI 32.85 kg/m     Wt Readings from Last 3 Encounters:  11/03/17 249 lb (112.9 kg)  10/23/17 240 lb (108.9 kg)  06/27/17 241 lb 6.4 oz (109.5 kg)     GEN: Well nourished, well developed in no acute distress HEENT: Normal NECK: No JVD; No carotid bruits CARDIAC: RRR, no murmurs, rubs, gallops RESPIRATORY:  Clear to auscultation without rales, wheezing or rhonchi  ABDOMEN: Soft, non-tender, non-distended MUSCULOSKELETAL:  No edema; No deformity  SKIN: Warm and dry NEUROLOGIC:  Alert and oriented x 3 PSYCHIATRIC:  Normal affect   ASSESSMENT:    1. Coronary artery disease involving native coronary artery of native heart with angina pectoris ([place])   2. Essential hypertension   3. Hyperlipidemia LDL goal <70   4. Chronic diastolic heart failure (HCC)    PLAN:    In order of problems listed above:  Coronary artery disease involving native coronary artery of native heart with angina pectoris [facility]) He reports experiencing chest discomfort related to DOE that he states is due to his ILD. He is not concerned about new blockages. He was recently hospitalized with kidney stones and had CP, ruled out with 2 negative troponins.  He is doing well on aspirin and Plavix, with  no bleeding problems.   Essential hypertension He is doing well on amlodipine, losartan, and metoprolol.  He is no longer taking Imdur.  Hyperlipidemia LDL goal <70 Lipid profile collected 06/07/2017: Total cholesterol: 110 HDL: 32 LDL: 59 Triglycerides: 94 Continue statin.  Chronic diastolic heart failure He is euvolemic on exam.  He takes the Lasix about once per week.  He admits that his Lasix usage generally correlates to a dietary indiscretion.  He is generally following a low-sodium diet.  He weighs about 1-2 times per week.   Follow-up in 1 year.   Medication Adjustments/Labs and Tests Ordered: Current medicines are reviewed at length with the patient today.  Concerns regarding medicines are outlined above.  No orders of the defined types were placed in this encounter.  No orders of the defined types were placed in this encounter.   Signed, Ledora Bottcher, PA  11/03/2017 11:14 AM    [facility]

## 2017-11-03 ENCOUNTER — Encounter: Payer: Self-pay | Admitting: Physician Assistant

## 2017-11-03 ENCOUNTER — Ambulatory Visit (INDEPENDENT_AMBULATORY_CARE_PROVIDER_SITE_OTHER): Payer: PPO | Admitting: Physician Assistant

## 2017-11-03 VITALS — BP 122/90 | HR 54 | Ht 73.0 in | Wt 249.0 lb

## 2017-11-03 DIAGNOSIS — I5032 Chronic diastolic (congestive) heart failure: Secondary | ICD-10-CM | POA: Diagnosis not present

## 2017-11-03 DIAGNOSIS — I10 Essential (primary) hypertension: Secondary | ICD-10-CM

## 2017-11-03 DIAGNOSIS — E785 Hyperlipidemia, unspecified: Secondary | ICD-10-CM

## 2017-11-03 DIAGNOSIS — I25119 Atherosclerotic heart disease of native coronary artery with unspecified angina pectoris: Secondary | ICD-10-CM

## 2017-11-03 NOTE — Patient Instructions (Signed)
Medication Instructions:  NO CHANGES  If you need a refill on your cardiac medications before your next appointment, please call your pharmacy.   Lab work: NONE  If you have labs (blood work) drawn today and your tests are completely normal, you will receive your results only by: Marland Kitchen MyChart Message (if you have MyChart) OR . A paper copy in the mail If you have any lab test that is abnormal or we need to change your treatment, we will call you to review the results.  Testing/Procedures: NONE  Follow-Up: At [facility], you and your health needs are our priority.  As part of our continuing mission to provide you with exceptional heart care, we have created designated Provider Care Teams.  These Care Teams include your primary Cardiologist (physician) and Advanced Practice Providers (APPs -  Physician Assistants and Nurse Practitioners) who all work together to provide you with the care you need, when you need it. You will need a follow up appointment in 1 years.  Please call our office 2 months in advance to schedule this appointment.  You may see [name], MD or one of the following Advanced Practice Providers on your designated Care Team: [place], [place] . Fabian Sharp, PA-C  Any Other Special Instructions Will Be Listed Below (If Applicable). NONE

## 2017-11-04 ENCOUNTER — Ambulatory Visit (INDEPENDENT_AMBULATORY_CARE_PROVIDER_SITE_OTHER): Payer: PPO | Admitting: Pulmonary Disease

## 2017-11-04 ENCOUNTER — Encounter: Payer: Self-pay | Admitting: Pulmonary Disease

## 2017-11-04 DIAGNOSIS — J849 Interstitial pulmonary disease, unspecified: Secondary | ICD-10-CM

## 2017-11-04 DIAGNOSIS — G4733 Obstructive sleep apnea (adult) (pediatric): Secondary | ICD-10-CM

## 2017-11-04 DIAGNOSIS — Z23 Encounter for immunization: Secondary | ICD-10-CM | POA: Diagnosis not present

## 2017-11-04 LAB — PULMONARY FUNCTION TEST
DL/VA % PRED: 121 %
DL/VA: 5.71 ml/min/mmHg/L
DLCO cor % pred: 98 %
DLCO cor: 32.68 ml/min/mmHg
DLCO unc % pred: 99 %
DLCO unc: 33.22 ml/min/mmHg
FEF 25-75 PRE: 4.74 L/s
FEF2575-%Pred-Pre: 127 %
FEV1-%PRED-PRE: 92 %
FEV1-PRE: 3.79 L
FEV1FVC-%Pred-Pre: 109 %
FEV6-%Pred-Pre: 86 %
FEV6-PRE: 4.4 L
FEV6FVC-%PRED-PRE: 103 %
FVC-%Pred-Pre: 83 %
FVC-PRE: 4.4 L
PRE FEV1/FVC RATIO: 86 %
Pre FEV6/FVC Ratio: 100 %

## 2017-11-04 NOTE — Patient Instructions (Signed)
Flu shot today. Lung function looks good

## 2017-11-04 NOTE — Progress Notes (Signed)
   Subjective:    Patient ID: Richard Simpson, male    DOB: Sep 30, 1970, 47 y.o.   MRN: [id number]  HPI  47 year old ex-smoker for follow-up of RA- ILD and OSA.  He had a flareup 12/2016, improved with prednisone and was put on Remicade and then finally CellCept as a steroid sparing agent.  He has decreased to 5 mg daily he is able to get on the treadmill and walk for 30 minutes about 1.5 miles, breathing much better. Lung function appears improved today. he still complains of some stiffness in his joints but he feels he can tolerate this  CPAP is certainly helped improve his daytime somnolence and fatigue.  Denies any problems with mask or pressure.  He is compliant as noted on his last download on his last visit  I reviewed chest x-ray and CT abdomen from an ED visit for kidney stones and this does not show any ulnar infiltrates  Significant tests/ events reviewed  PFTs 10/2017 FVC 83%, DLCO 98%  HRCT 12/2016 >>worsening multifocal areas of ground-glass attenuation, most confluent in the left upper lobe, but with progressive involvement in the lower lobes of the lungs bilaterally as well.  5/31/2016CT angio chest >>Patchy regions of ground-glass attenuation opacity 29mm &6cm in the medial aspect of the left upper lobe with associated volume loss 09/2014 CT chest >Decrease in size of ground-glass attenuating opacity within the perihilar left upper lobe compatible with a benign, likely infectious or inflammatory process.  Split NPSG  12/2016 -wt 275 pounds -  AHI 60/hour with lowest desaturation 75%. This was corrected by CPAP 8 cm with nasal pillows  PFTs 12/2013 >>no obs,FVC 77%, nml DLCO -Mild restriction?effect of obesity  PFT12/2018moderate restriction .FEV1 70%, ratio 86, FVC 63%, DLCO 74%.  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 Pleasant, obese, in no distress ENT - no lesions, no post nasal drip Neck: No JVD, no thyromegaly, no carotid bruits Lungs: no use of accessory muscles, no dullness to percussion, decreased without rales or rhonchi  Cardiovascular: Rhythm regular, heart sounds  normal, no murmurs or gallops, no peripheral edema Musculoskeletal: No deformities, no cyanosis or clubbing , no tremors       Assessment & Plan:

## 2017-11-04 NOTE — Assessment & Plan Note (Signed)
If he continues to lose weight up to 20 pounds, would repeat home sleep study.  Weight loss encouraged, compliance with goal of at least 4-6 hrs every night is the expectation. Advised against medications with sedative side effects Cautioned against driving when sleepy - understanding that sleepiness will vary on a day to day basis

## 2017-11-04 NOTE — Assessment & Plan Note (Signed)
Appears to be in remission on CellCept and 5 mg of prednisone.  Will be to taper prednisone to off.  Endpoint of treatment would be to control disease and joints as well as in lungs Lung function has recovered

## 2017-11-04 NOTE — Progress Notes (Signed)
PFT completed today per order. 11/04/17

## 2017-11-15 ENCOUNTER — Other Ambulatory Visit: Payer: Self-pay | Admitting: Cardiovascular Disease

## 2017-11-24 DIAGNOSIS — G4733 Obstructive sleep apnea (adult) (pediatric): Secondary | ICD-10-CM | POA: Diagnosis not present

## 2017-11-28 DIAGNOSIS — I2510 Atherosclerotic heart disease of native coronary artery without angina pectoris: Secondary | ICD-10-CM | POA: Diagnosis not present

## 2017-11-28 DIAGNOSIS — J849 Interstitial pulmonary disease, unspecified: Secondary | ICD-10-CM | POA: Diagnosis not present

## 2017-11-28 DIAGNOSIS — E669 Obesity, unspecified: Secondary | ICD-10-CM | POA: Diagnosis not present

## 2017-11-28 DIAGNOSIS — M9902 Segmental and somatic dysfunction of thoracic region: Secondary | ICD-10-CM | POA: Diagnosis not present

## 2017-11-28 DIAGNOSIS — M0609 Rheumatoid arthritis without rheumatoid factor, multiple sites: Secondary | ICD-10-CM | POA: Diagnosis not present

## 2017-11-28 DIAGNOSIS — Z6834 Body mass index (BMI) 34.0-34.9, adult: Secondary | ICD-10-CM | POA: Diagnosis not present

## 2017-11-28 DIAGNOSIS — M9903 Segmental and somatic dysfunction of lumbar region: Secondary | ICD-10-CM | POA: Diagnosis not present

## 2017-11-28 DIAGNOSIS — M5440 Lumbago with sciatica, unspecified side: Secondary | ICD-10-CM | POA: Diagnosis not present

## 2017-11-28 DIAGNOSIS — M545 Low back pain: Secondary | ICD-10-CM | POA: Diagnosis not present

## 2017-11-28 DIAGNOSIS — M9905 Segmental and somatic dysfunction of pelvic region: Secondary | ICD-10-CM | POA: Diagnosis not present

## 2017-12-07 DIAGNOSIS — M9903 Segmental and somatic dysfunction of lumbar region: Secondary | ICD-10-CM | POA: Diagnosis not present

## 2017-12-07 DIAGNOSIS — M545 Low back pain: Secondary | ICD-10-CM | POA: Diagnosis not present

## 2017-12-07 DIAGNOSIS — M9905 Segmental and somatic dysfunction of pelvic region: Secondary | ICD-10-CM | POA: Diagnosis not present

## 2017-12-07 DIAGNOSIS — M9902 Segmental and somatic dysfunction of thoracic region: Secondary | ICD-10-CM | POA: Diagnosis not present

## 2017-12-09 DIAGNOSIS — Z0001 Encounter for general adult medical examination with abnormal findings: Secondary | ICD-10-CM | POA: Diagnosis not present

## 2017-12-12 DIAGNOSIS — I10 Essential (primary) hypertension: Secondary | ICD-10-CM | POA: Diagnosis not present

## 2017-12-12 DIAGNOSIS — Z0001 Encounter for general adult medical examination with abnormal findings: Secondary | ICD-10-CM | POA: Diagnosis not present

## 2017-12-12 DIAGNOSIS — Z6834 Body mass index (BMI) 34.0-34.9, adult: Secondary | ICD-10-CM | POA: Diagnosis not present

## 2017-12-12 DIAGNOSIS — M05741 Rheumatoid arthritis with rheumatoid factor of right hand without organ or systems involvement: Secondary | ICD-10-CM | POA: Diagnosis not present

## 2017-12-12 DIAGNOSIS — I2510 Atherosclerotic heart disease of native coronary artery without angina pectoris: Secondary | ICD-10-CM | POA: Diagnosis not present

## 2017-12-12 DIAGNOSIS — E782 Mixed hyperlipidemia: Secondary | ICD-10-CM | POA: Diagnosis not present

## 2017-12-12 DIAGNOSIS — Z23 Encounter for immunization: Secondary | ICD-10-CM | POA: Diagnosis not present

## 2017-12-12 DIAGNOSIS — R7301 Impaired fasting glucose: Secondary | ICD-10-CM | POA: Diagnosis not present

## 2017-12-24 DIAGNOSIS — G4733 Obstructive sleep apnea (adult) (pediatric): Secondary | ICD-10-CM | POA: Diagnosis not present

## 2018-01-24 DIAGNOSIS — G4733 Obstructive sleep apnea (adult) (pediatric): Secondary | ICD-10-CM | POA: Diagnosis not present

## 2018-02-03 DIAGNOSIS — G4733 Obstructive sleep apnea (adult) (pediatric): Secondary | ICD-10-CM | POA: Diagnosis not present

## 2018-02-24 DIAGNOSIS — G4733 Obstructive sleep apnea (adult) (pediatric): Secondary | ICD-10-CM | POA: Diagnosis not present

## 2018-03-08 ENCOUNTER — Other Ambulatory Visit: Payer: Self-pay | Admitting: Cardiovascular Disease

## 2018-03-24 DIAGNOSIS — E782 Mixed hyperlipidemia: Secondary | ICD-10-CM | POA: Diagnosis not present

## 2018-03-24 DIAGNOSIS — K219 Gastro-esophageal reflux disease without esophagitis: Secondary | ICD-10-CM | POA: Diagnosis not present

## 2018-03-24 DIAGNOSIS — I10 Essential (primary) hypertension: Secondary | ICD-10-CM | POA: Diagnosis not present

## 2018-03-24 DIAGNOSIS — E6609 Other obesity due to excess calories: Secondary | ICD-10-CM | POA: Diagnosis not present

## 2018-03-25 DIAGNOSIS — G4733 Obstructive sleep apnea (adult) (pediatric): Secondary | ICD-10-CM | POA: Diagnosis not present

## 2018-03-28 DIAGNOSIS — Z6835 Body mass index (BMI) 35.0-35.9, adult: Secondary | ICD-10-CM | POA: Diagnosis not present

## 2018-03-28 DIAGNOSIS — M5440 Lumbago with sciatica, unspecified side: Secondary | ICD-10-CM | POA: Diagnosis not present

## 2018-03-28 DIAGNOSIS — M0609 Rheumatoid arthritis without rheumatoid factor, multiple sites: Secondary | ICD-10-CM | POA: Diagnosis not present

## 2018-03-28 DIAGNOSIS — J849 Interstitial pulmonary disease, unspecified: Secondary | ICD-10-CM | POA: Diagnosis not present

## 2018-03-28 DIAGNOSIS — I2510 Atherosclerotic heart disease of native coronary artery without angina pectoris: Secondary | ICD-10-CM | POA: Diagnosis not present

## 2018-03-28 DIAGNOSIS — E669 Obesity, unspecified: Secondary | ICD-10-CM | POA: Diagnosis not present

## 2018-04-04 ENCOUNTER — Other Ambulatory Visit: Payer: Self-pay | Admitting: Cardiovascular Disease

## 2018-04-23 DIAGNOSIS — G4733 Obstructive sleep apnea (adult) (pediatric): Secondary | ICD-10-CM | POA: Diagnosis not present

## 2018-05-05 ENCOUNTER — Other Ambulatory Visit: Payer: Self-pay | Admitting: Cardiovascular Disease

## 2018-05-11 DIAGNOSIS — J019 Acute sinusitis, unspecified: Secondary | ICD-10-CM | POA: Diagnosis not present

## 2018-05-19 DIAGNOSIS — J019 Acute sinusitis, unspecified: Secondary | ICD-10-CM | POA: Diagnosis not present

## 2018-05-19 DIAGNOSIS — H9202 Otalgia, left ear: Secondary | ICD-10-CM | POA: Diagnosis not present

## 2018-05-24 DIAGNOSIS — G4733 Obstructive sleep apnea (adult) (pediatric): Secondary | ICD-10-CM | POA: Diagnosis not present

## 2018-05-31 ENCOUNTER — Other Ambulatory Visit: Payer: Self-pay | Admitting: Cardiovascular Disease

## 2018-05-31 NOTE — Telephone Encounter (Signed)
Lopressor 25 mg refilled 

## 2018-06-05 DIAGNOSIS — E782 Mixed hyperlipidemia: Secondary | ICD-10-CM | POA: Diagnosis not present

## 2018-06-05 DIAGNOSIS — K219 Gastro-esophageal reflux disease without esophagitis: Secondary | ICD-10-CM | POA: Diagnosis not present

## 2018-06-05 DIAGNOSIS — I10 Essential (primary) hypertension: Secondary | ICD-10-CM | POA: Diagnosis not present

## 2018-06-09 DIAGNOSIS — K219 Gastro-esophageal reflux disease without esophagitis: Secondary | ICD-10-CM | POA: Diagnosis not present

## 2018-06-09 DIAGNOSIS — Z6833 Body mass index (BMI) 33.0-33.9, adult: Secondary | ICD-10-CM | POA: Diagnosis not present

## 2018-06-09 DIAGNOSIS — E782 Mixed hyperlipidemia: Secondary | ICD-10-CM | POA: Diagnosis not present

## 2018-06-09 DIAGNOSIS — M05741 Rheumatoid arthritis with rheumatoid factor of right hand without organ or systems involvement: Secondary | ICD-10-CM | POA: Diagnosis not present

## 2018-06-09 DIAGNOSIS — J301 Allergic rhinitis due to pollen: Secondary | ICD-10-CM | POA: Diagnosis not present

## 2018-06-09 DIAGNOSIS — I10 Essential (primary) hypertension: Secondary | ICD-10-CM | POA: Diagnosis not present

## 2018-06-09 DIAGNOSIS — R7301 Impaired fasting glucose: Secondary | ICD-10-CM | POA: Diagnosis not present

## 2018-06-09 DIAGNOSIS — I2510 Atherosclerotic heart disease of native coronary artery without angina pectoris: Secondary | ICD-10-CM | POA: Diagnosis not present

## 2018-06-23 DIAGNOSIS — G4733 Obstructive sleep apnea (adult) (pediatric): Secondary | ICD-10-CM | POA: Diagnosis not present

## 2018-06-24 DIAGNOSIS — E782 Mixed hyperlipidemia: Secondary | ICD-10-CM | POA: Diagnosis not present

## 2018-06-24 DIAGNOSIS — E1165 Type 2 diabetes mellitus with hyperglycemia: Secondary | ICD-10-CM | POA: Diagnosis not present

## 2018-06-24 DIAGNOSIS — I10 Essential (primary) hypertension: Secondary | ICD-10-CM | POA: Diagnosis not present

## 2018-06-28 DIAGNOSIS — M0609 Rheumatoid arthritis without rheumatoid factor, multiple sites: Secondary | ICD-10-CM | POA: Diagnosis not present

## 2018-06-28 DIAGNOSIS — M5440 Lumbago with sciatica, unspecified side: Secondary | ICD-10-CM | POA: Diagnosis not present

## 2018-06-28 DIAGNOSIS — I2510 Atherosclerotic heart disease of native coronary artery without angina pectoris: Secondary | ICD-10-CM | POA: Diagnosis not present

## 2018-06-28 DIAGNOSIS — J849 Interstitial pulmonary disease, unspecified: Secondary | ICD-10-CM | POA: Diagnosis not present

## 2018-06-30 DIAGNOSIS — M0609 Rheumatoid arthritis without rheumatoid factor, multiple sites: Secondary | ICD-10-CM | POA: Diagnosis not present

## 2018-07-14 ENCOUNTER — Other Ambulatory Visit: Payer: Self-pay | Admitting: Cardiovascular Disease

## 2018-07-24 DIAGNOSIS — G4733 Obstructive sleep apnea (adult) (pediatric): Secondary | ICD-10-CM | POA: Diagnosis not present

## 2018-07-25 DIAGNOSIS — E782 Mixed hyperlipidemia: Secondary | ICD-10-CM | POA: Diagnosis not present

## 2018-07-25 DIAGNOSIS — I10 Essential (primary) hypertension: Secondary | ICD-10-CM | POA: Diagnosis not present

## 2018-08-16 DIAGNOSIS — R1031 Right lower quadrant pain: Secondary | ICD-10-CM | POA: Diagnosis not present

## 2018-08-16 DIAGNOSIS — Z6834 Body mass index (BMI) 34.0-34.9, adult: Secondary | ICD-10-CM | POA: Diagnosis not present

## 2018-08-16 DIAGNOSIS — R109 Unspecified abdominal pain: Secondary | ICD-10-CM | POA: Diagnosis not present

## 2018-08-17 DIAGNOSIS — R1031 Right lower quadrant pain: Secondary | ICD-10-CM | POA: Diagnosis not present

## 2018-08-17 DIAGNOSIS — N200 Calculus of kidney: Secondary | ICD-10-CM | POA: Diagnosis not present

## 2018-08-17 DIAGNOSIS — K760 Fatty (change of) liver, not elsewhere classified: Secondary | ICD-10-CM | POA: Diagnosis not present

## 2018-08-21 DIAGNOSIS — I700 Atherosclerosis of aorta: Secondary | ICD-10-CM | POA: Diagnosis not present

## 2018-08-21 DIAGNOSIS — R1031 Right lower quadrant pain: Secondary | ICD-10-CM | POA: Diagnosis not present

## 2018-08-21 DIAGNOSIS — Z6833 Body mass index (BMI) 33.0-33.9, adult: Secondary | ICD-10-CM | POA: Diagnosis not present

## 2018-08-23 DIAGNOSIS — G4733 Obstructive sleep apnea (adult) (pediatric): Secondary | ICD-10-CM | POA: Diagnosis not present

## 2018-08-25 ENCOUNTER — Telehealth: Payer: Self-pay | Admitting: Internal Medicine

## 2018-08-25 DIAGNOSIS — E782 Mixed hyperlipidemia: Secondary | ICD-10-CM | POA: Diagnosis not present

## 2018-08-25 DIAGNOSIS — I10 Essential (primary) hypertension: Secondary | ICD-10-CM | POA: Diagnosis not present

## 2018-08-25 NOTE — Telephone Encounter (Signed)
Patient called me at home 2 nights ago.  A week and a half history of vague mid right abdominal pain with a postprandial component.  Not associated with bowel function.  No fever or chills.  Gallbladder out history of GERD.  Saw Dr. Quillian Quince underwent a contrast CT of the abdomen pelvis in [place] had blood work done.  I reviewed the CT images with Dr. Darcella Cheshire yesterday.  Gallbladder out no biliary dilation appendix looks good specifically, pancreas look good and patient had patent mesenteric blood vessels.  No biliary dilation.  Also labs including a C met CBC amylase lipase also normal through Dr. Olena Heckle office.  At this point etiology of his symptoms are not clear certainly CT scan and lab work reassuring at this time.  I told patient if his symptoms become severe he should present to the ED.  If they do not resolve, he will need to be seen in our office.

## 2018-08-29 ENCOUNTER — Telehealth: Payer: Self-pay | Admitting: Gastroenterology

## 2018-08-29 ENCOUNTER — Encounter: Payer: Self-pay | Admitting: Gastroenterology

## 2018-08-29 ENCOUNTER — Other Ambulatory Visit: Payer: Self-pay

## 2018-08-29 ENCOUNTER — Ambulatory Visit: Payer: PPO | Admitting: Gastroenterology

## 2018-08-29 ENCOUNTER — Other Ambulatory Visit: Payer: Self-pay | Admitting: *Deleted

## 2018-08-29 DIAGNOSIS — R109 Unspecified abdominal pain: Secondary | ICD-10-CM | POA: Insufficient documentation

## 2018-08-29 MED ORDER — PANTOPRAZOLE SODIUM 40 MG PO TBEC: 40.0000 mg | DELAYED_RELEASE_TABLET | Freq: Every day | ORAL | 5 refills | Status: DC

## 2018-08-29 NOTE — Patient Instructions (Signed)
1. I will contact you in the next 24 hours with further instructions once discussed with Dr. Gala Romney.

## 2018-08-29 NOTE — Assessment & Plan Note (Signed)
Pleasant 48 y/o male with several week h/o right sided abd pain, worse postprandially, unrelated to bowel function. Initially RLQ only, now with migration of pain into RUQ. CT and labs unrevealing. Given postprandial component would be suspicious of upper gi source or biliary. Gallbladder has been removed but cannot exclude retained CBD stone/sludge/etc. No evidence of IBD, appendicitis, IBD on recent imaging. Cannot exclude PUD in setting of chronic steroids.   Discussed case with Dr. Gala Romney. Plan for labs (LFTs) 6 hours onset of significant pain (provocative test with fatty meal). Add pantoprazole 40mg  daily before breakfast. Tentatively plan for EGD with propofol in 3-4 weeks however if symptoms resolved prior to that, can cancel procedure. Patient voiced understanding  I have discussed the risks, alternatives, benefits with regards to but not limited to the risk of reaction to medication, bleeding, infection, perforation and the patient is agreeable to proceed. Written consent to be obtained.

## 2018-08-29 NOTE — H&P (View-Only) (Signed)
Primary Care Physician: Caryl Bis, MD  Primary Gastroenterologist:  Garfield Cornea, MD  Chief Complaint  Patient presents with  . Abdominal Pain    RLQ mostly for few weeks, constant pain, moved to RUQ in the past 3-4 days that is becoming constant  . Nausea    no vomting    HPI: Richard Simpson is a 48 y.o. male here for further evaluation of abdominal pain. He was last seen at time of colonoscopy in 2018. Came back from vacation on July 18th. Few days later, he noted RLQ pain associated with nausea. Due to persisting pain he was seen by PCP. CT with contrast on 08/17/18, fatty liver, bilateral nonobstructing right renal stones. Appendix normal.   Labs done through Dr. Olena Heckle office included negative H. pylori IgG, white blood cell count 6600, hemoglobin 15.9, platelets 108,000, glucose 91, BUN 21, creatinine 0.77, sodium 145, potassium 4.4, albumin 4.7, total bilirubin 1.2, alkaline phosphatase 68, AST 23, ALT 33, lipase 28, amylase 46.  Patient notes pain is constant around a 2 on pain scale of 1-10. Increased pain with meals. Nausea and diminished appetite associated with 5 pound weight loss. Pain has now migrated upwards to RUQ but some persistent RLQ pain. No fever. Unrelated to bowel function. Normal daily BMs with no melena, brbpr. First week of pain, increased number of stools but still formed.   Avoids breakfast. Eats protein shake at lunch. For supper he gets hungry and eats but "pays for it". Last night within one hour after eating sausage/eggs/gravy and biscuits he had stabbing RUQ pain. Pain is different than typical kidney stone pain. Two weeks before these symptoms, he was having some heartburn. Hasn't required antacids lately.   Changed remicade to cellcept several months back, approximately 8 months ago. H/O RA and interstitial lung disease. Only recent medication change in last few months was coming off prednisone. Transitioned from prednisone to Medrol.    Remote EGD over 15 years ago.    Current Outpatient Medications  Medication Sig Dispense Refill  . amLODipine (NORVASC) 5 MG tablet TAKE 1/2 TABLET BY MOUTH ONCE A DAY. 45 tablet 2  . aspirin EC 81 MG tablet Take 1 tablet (81 mg total) by mouth daily. 30 tablet 3  . atorvastatin (LIPITOR) 80 MG tablet TAKE (1) TABLET BY MOUTH ONCE DAILY. 90 tablet 3  . clonazePAM (KLONOPIN) 0.5 MG tablet Take 1 mg by mouth 2 (two) times daily as needed for anxiety (takes 1 time daily and 2 nd dose only if needed).     . clopidogrel (PLAVIX) 75 MG tablet TAKE 1 TABLET BY MOUTH ONCE A DAY. 30 tablet 5  . furosemide (LASIX) 20 MG tablet TAKE (1) TABLET BY MOUTH ONCE DAILY. (Patient taking differently: Take 20 mg by mouth as needed. ) 30 tablet 6  . loratadine (CLARITIN) 10 MG tablet Take 10 mg by mouth daily.     Marland Kitchen losartan (COZAAR) 25 MG tablet TAKE 1 TABLET BY MOUTH IN THE MORNING. 90 tablet 2  . methylPREDNISolone (MEDROL) 4 MG tablet Take 1 tablet by mouth daily.    . metoprolol tartrate (LOPRESSOR) 25 MG tablet TAKE (1) TABLET BY MOUTH TWICE DAILY. 60 tablet 1  . mycophenolate (CELLCEPT) 500 MG tablet 2 (two) times daily.     . nitroGLYCERIN (NITROSTAT) 0.4 MG SL tablet DISSOLVE 1 TABLET UNDER TONGUE FOR CHEST TIGHTNESS AND SHORTNESS OF BREATH. MAY TAKE EVERY 5 MINUTES UP TO 3 DOSES. IF NO RELIEF  AFTER 3 DOS 25 tablet 0  . traMADol (ULTRAM) 50 MG tablet Take 50 mg by mouth every 6 (six) hours as needed for moderate pain.      No current facility-administered medications for this visit.     Allergies as of 08/29/2018 - Review Complete 08/29/2018  Allergen Reaction Noted  . Lisinopril Cough 07/12/2014  . Prednisone Other (See Comments) 10/29/2013   Past Medical History:  Diagnosis Date  . Anxiety   . CAD S/P percutaneous coronary angioplasty 10/2013   a. 10/2013 Cath: mod LAD dzs->nl FFR-> medically managed;  b. 06/2014 NSTEMI/PCi: LAD 99ost, 70p, 3m (entire area covered with a 3.5x20 Synergy DES),  LCX nl, RCA 77m, 10d, EF 45-50%. c. 10/2015: cath showing patent LAD stent with 15% Prox LAD stenosis. EF 55-65%.   . 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 Depression   . Dyslipidemia, goal LDL below 70   . Essential hypertension   . GERD (gastroesophageal reflux disease)   . Headache   . History of kidney stones   . Interstitial lung disease ([place])   . Ischemic cardiomyopathy - Resolved 06/2014   a. 06/2014 EF 45-50% by LV gram b. Echo July 2016: EF 60-65%. No RWMA. Gr 1 DD   . NSTEMI (non-ST elevated myocardial infarction) ([place]) 06/2014  . Obesity (BMI 30-39.9) 10/29/2013  . Primary snoring 10/29/2013  . Rheumatoid arthritis ([place]) 09/23/2013  . RLS (restless legs syndrome) 10/29/2013   Past Surgical History:  Procedure Laterality Date  . CARDIAC CATHETERIZATION N/A 07/05/2014   Procedure: Left Heart Cath and Coronary Angiography;  Surgeon: Troy Sine, MD;  Location: [place] [facility];  Service: Cardiovascular;  NSTEMI --> Ostial LAd 99%, prox 80% & early mid 50% --> PCI  . CARDIAC CATHETERIZATION  07/05/2014   Procedure: Coronary Stent Intervention;  Surgeon: Troy Sine, MD;  Location: [place] [facility];  Service: Cardiovascular; Ostial-prox LAD tandem 99, 80 & 50% --> Synergy DES 3.5 x 20 (3.74)  . CARDIAC CATHETERIZATION N/A 03/24/2015   Procedure: Left Heart Cath and Coronary Angiography;  Surgeon: Leonie Man, MD;  Location: [place] [facility];  Service: Cardiovascular;  Laterality: N/A;  . CARDIAC CATHETERIZATION N/A 11/20/2015   Procedure: Left Heart Cath and Coronary Angiography;  Surgeon: Troy Sine, MD;  Location: [place] [facility];  Service: Cardiovascular;  Laterality: N/A;  . CHOLECYSTECTOMY     10 yrs ago-jenkins  . COLONOSCOPY N/A 03/23/2013   Procedure: COLONOSCOPY;  Surgeon: Daneil Dolin, MD;  [facility];  Service: Endoscopy;  Laterality: N/A;  2:15-moved to 1030 Staff  notified pt  . COLONOSCOPY WITH PROPOFOL N/A 06/10/2016   Procedure: COLONOSCOPY WITH PROPOFOL;  Surgeon: Daneil Dolin, MD;  [facility];  Service: Endoscopy;  Laterality: N/A;  730   . FRACTIONAL FLOW RESERVE WIRE  11/23/2013   Procedure: FRACTIONAL FLOW RESERVE WIRE;  Surgeon: Jettie Booze, MD;  Location: [facility];  Service: Cardiovascular;;  . GALLBLADDER SURGERY    . LEFT HEART CATHETERIZATION WITH CORONARY ANGIOGRAM N/A 11/23/2013   Procedure: LEFT HEART CATHETERIZATION WITH CORONARY ANGIOGRAM;  Surgeon: Jettie Booze, MD;  Location: [facility] CATH LAB;  Service: Cardiovascular;  Laterality: N/A;  . MASS EXCISION  09/09/2010   Procedure: EXCISION MASS;  Surgeon: Arther Abbott, MD;  Location: [facility];  Service: Orthopedics;  Laterality: Left;  Excision Mass Left Long Finger  .  POLYPECTOMY  06/10/2016   Procedure: POLYPECTOMY;  Surgeon: Daneil Dolin, MD;  [facility];  Service: Endoscopy;;  colon  . THROAT SURGERY     nodules removed from throat as child  . TRANSTHORACIC ECHOCARDIOGRAM   July 2016:    EF 60-65%. No RWMA. Gr 1 DD   Family History  Problem Relation Age of Onset  . Hypertension Mother   . Diabetes Mother   . Arthritis Paternal Uncle   . Cancer Maternal Grandfather   . Hypertension Maternal Grandmother   . Diabetes Maternal Grandmother   . Diabetes Other   . Diabetes Other   . Diabetes Paternal Grandfather   . Anesthesia problems Neg Hx   . Hypotension Neg Hx   . Malignant hyperthermia Neg Hx   . Pseudochol deficiency Neg Hx   . Heart attack Neg Hx   . Stroke Neg Hx    Social History   Tobacco Use  . Smoking status: Former Smoker    Packs/day: 0.25    Years: 15.00    Pack years: 3.75    Types: Cigarettes    Quit date: 09/04/2003    Years since quitting: 14.9  . Smokeless tobacco: Current User    Types: Chew  Substance Use Topics  . Alcohol use: Yes    Alcohol/week: 1.0 - 2.0 standard drinks    Types: 1 - 2 Cans of  beer per week    Comment: rare  . Drug use: No    ROS:  General: Negative for fever, chills, fatigue, weakness.see hpi ENT: Negative for hoarseness, difficulty swallowing , nasal congestion. CV: Negative for chest pain, angina, palpitations, dyspnea on exertion, peripheral edema.  Respiratory: Negative for dyspnea at rest, dyspnea on exertion, cough, sputum, wheezing.  GI: See history of present illness. GU:  Negative for dysuria, hematuria, urinary incontinence, urinary frequency, nocturnal urination.  Endo: Negative for unusual weight change. See hpi   Physical Examination:   BP 98/68   Pulse 61   Temp 97.8 F (36.6 C) (Oral)   Ht 6\' 1"  (1.854 m)   Wt 251 lb 3.2 oz (113.9 kg)   BMI 33.14 kg/m   General: Well-nourished, well-developed in no acute distress.  Eyes: No icterus. Mouth: Oropharyngeal mucosa moist and pink , no lesions erythema or exudate. Lungs: Clear to auscultation bilaterally.  Heart: Regular rate and rhythm, no murmurs rubs or gallops.  Abdomen: Bowel sounds are normal, mild rlq tenderness, moderate tenderness ruq, nondistended, no hepatosplenomegaly or masses, no abdominal bruits or hernia , no rebound or guarding.   Extremities: No lower extremity edema. No clubbing or deformities. Neuro: Alert and oriented x 4   Skin: Warm and dry, no jaundice.   Psych: Alert and cooperative, normal mood and affect.  Labs:  See HPI Imaging Studies: No results found.

## 2018-08-29 NOTE — Progress Notes (Signed)
Primary Care Physician: Caryl Bis, MD  Primary Gastroenterologist:  Garfield Cornea, MD  Chief Complaint  Patient presents with  . Abdominal Pain    RLQ mostly for few weeks, constant pain, moved to RUQ in the past 3-4 days that is becoming constant  . Nausea    no vomting    HPI: Richard Simpson is a 48 y.o. male here for further evaluation of abdominal pain. He was last seen at time of colonoscopy in 2018. Came back from vacation on July 18th. Few days later, he noted RLQ pain associated with nausea. Due to persisting pain he was seen by PCP. CT with contrast on 08/17/18, fatty liver, bilateral nonobstructing right renal stones. Appendix normal.   Labs done through Dr. Olena Heckle office included negative H. pylori IgG, white blood cell count 6600, hemoglobin 15.9, platelets 108,000, glucose 91, BUN 21, creatinine 0.77, sodium 145, potassium 4.4, albumin 4.7, total bilirubin 1.2, alkaline phosphatase 68, AST 23, ALT 33, lipase 28, amylase 46.  Patient notes pain is constant around a 2 on pain scale of 1-10. Increased pain with meals. Nausea and diminished appetite associated with 5 pound weight loss. Pain has now migrated upwards to RUQ but some persistent RLQ pain. No fever. Unrelated to bowel function. Normal daily BMs with no melena, brbpr. First week of pain, increased number of stools but still formed.   Avoids breakfast. Eats protein shake at lunch. For supper he gets hungry and eats but "pays for it". Last night within one hour after eating sausage/eggs/gravy and biscuits he had stabbing RUQ pain. Pain is different than typical kidney stone pain. Two weeks before these symptoms, he was having some heartburn. Hasn't required antacids lately.   Changed remicade to cellcept several months back, approximately 8 months ago. H/O RA and interstitial lung disease. Only recent medication change in last few months was coming off prednisone. Transitioned from prednisone to Medrol.    Remote EGD over 15 years ago.    Current Outpatient Medications  Medication Sig Dispense Refill  . amLODipine (NORVASC) 5 MG tablet TAKE 1/2 TABLET BY MOUTH ONCE A DAY. 45 tablet 2  . aspirin EC 81 MG tablet Take 1 tablet (81 mg total) by mouth daily. 30 tablet 3  . atorvastatin (LIPITOR) 80 MG tablet TAKE (1) TABLET BY MOUTH ONCE DAILY. 90 tablet 3  . clonazePAM (KLONOPIN) 0.5 MG tablet Take 1 mg by mouth 2 (two) times daily as needed for anxiety (takes 1 time daily and 2 nd dose only if needed).     . clopidogrel (PLAVIX) 75 MG tablet TAKE 1 TABLET BY MOUTH ONCE A DAY. 30 tablet 5  . furosemide (LASIX) 20 MG tablet TAKE (1) TABLET BY MOUTH ONCE DAILY. (Patient taking differently: Take 20 mg by mouth as needed. ) 30 tablet 6  . loratadine (CLARITIN) 10 MG tablet Take 10 mg by mouth daily.     Marland Kitchen losartan (COZAAR) 25 MG tablet TAKE 1 TABLET BY MOUTH IN THE MORNING. 90 tablet 2  . methylPREDNISolone (MEDROL) 4 MG tablet Take 1 tablet by mouth daily.    . metoprolol tartrate (LOPRESSOR) 25 MG tablet TAKE (1) TABLET BY MOUTH TWICE DAILY. 60 tablet 1  . mycophenolate (CELLCEPT) 500 MG tablet 2 (two) times daily.     . nitroGLYCERIN (NITROSTAT) 0.4 MG SL tablet DISSOLVE 1 TABLET UNDER TONGUE FOR CHEST TIGHTNESS AND SHORTNESS OF BREATH. MAY TAKE EVERY 5 MINUTES UP TO 3 DOSES. IF NO RELIEF  AFTER 3 DOS 25 tablet 0  . traMADol (ULTRAM) 50 MG tablet Take 50 mg by mouth every 6 (six) hours as needed for moderate pain.      No current facility-administered medications for this visit.     Allergies as of 08/29/2018 - Review Complete 08/29/2018  Allergen Reaction Noted  . Lisinopril Cough 07/12/2014  . Prednisone Other (See Comments) 10/29/2013   Past Medical History:  Diagnosis Date  . Anxiety   . CAD S/P percutaneous coronary angioplasty 10/2013   a. 10/2013 Cath: mod LAD dzs->nl FFR-> medically managed;  b. 06/2014 NSTEMI/PCi: LAD 99ost, 70p, 36m (entire area covered with a 3.5x20 Synergy DES),  LCX nl, RCA 58m, 10d, EF 45-50%. c. 10/2015: cath showing patent LAD stent with 15% Prox LAD stenosis. EF 55-65%.   . 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 Depression   . Dyslipidemia, goal LDL below 70   . Essential hypertension   . GERD (gastroesophageal reflux disease)   . Headache   . History of kidney stones   . Interstitial lung disease ([place])   . Ischemic cardiomyopathy - Resolved 06/2014   a. 06/2014 EF 45-50% by LV gram b. Echo July 2016: EF 60-65%. No RWMA. Gr 1 DD   . NSTEMI (non-ST elevated myocardial infarction) ([place]) 06/2014  . Obesity (BMI 30-39.9) 10/29/2013  . Primary snoring 10/29/2013  . Rheumatoid arthritis ([place]) 09/23/2013  . RLS (restless legs syndrome) 10/29/2013   Past Surgical History:  Procedure Laterality Date  . CARDIAC CATHETERIZATION N/A 07/05/2014   Procedure: Left Heart Cath and Coronary Angiography;  Surgeon: Troy Sine, MD;  Location: [place] [facility];  Service: Cardiovascular;  NSTEMI --> Ostial LAd 99%, prox 80% & early mid 50% --> PCI  . CARDIAC CATHETERIZATION  07/05/2014   Procedure: Coronary Stent Intervention;  Surgeon: Troy Sine, MD;  Location: [place] [facility];  Service: Cardiovascular; Ostial-prox LAD tandem 99, 80 & 50% --> Synergy DES 3.5 x 20 (3.74)  . CARDIAC CATHETERIZATION N/A 03/24/2015   Procedure: Left Heart Cath and Coronary Angiography;  Surgeon: Leonie Man, MD;  Location: [place] [facility];  Service: Cardiovascular;  Laterality: N/A;  . CARDIAC CATHETERIZATION N/A 11/20/2015   Procedure: Left Heart Cath and Coronary Angiography;  Surgeon: Troy Sine, MD;  Location: [place] [facility];  Service: Cardiovascular;  Laterality: N/A;  . CHOLECYSTECTOMY     10 yrs ago-jenkins  . COLONOSCOPY N/A 03/23/2013   Procedure: COLONOSCOPY;  Surgeon: Daneil Dolin, MD;  [facility];  Service: Endoscopy;  Laterality: N/A;  2:15-moved to 1030 Staff  notified pt  . COLONOSCOPY WITH PROPOFOL N/A 06/10/2016   Procedure: COLONOSCOPY WITH PROPOFOL;  Surgeon: Daneil Dolin, MD;  [facility];  Service: Endoscopy;  Laterality: N/A;  730   . FRACTIONAL FLOW RESERVE WIRE  11/23/2013   Procedure: FRACTIONAL FLOW RESERVE WIRE;  Surgeon: Jettie Booze, MD;  Location: [facility] CATH LAB;  Service: Cardiovascular;;  . GALLBLADDER SURGERY    . LEFT HEART CATHETERIZATION WITH CORONARY ANGIOGRAM N/A 11/23/2013   Procedure: LEFT HEART CATHETERIZATION WITH CORONARY ANGIOGRAM;  Surgeon: Jettie Booze, MD;  Location: [facility] C[facility] LAB;  Service: Cardiovascular;  Laterality: N/A;  . MASS EXCISION  09/09/2010   Procedure: EXCISION MASS;  Surgeon: Arther Abbott, MD;  [facility];  Service: Orthopedics;  Laterality: Left;  Excision Mass Left Long Finger  .  POLYPECTOMY  06/10/2016   Procedure: POLYPECTOMY;  Surgeon: Daneil Dolin, MD;  [facility];  Service: Endoscopy;;  colon  . THROAT SURGERY     nodules removed from throat as child  . TRANSTHORACIC ECHOCARDIOGRAM   July 2016:    EF 60-65%. No RWMA. Gr 1 DD   Family History  Problem Relation Age of Onset  . Hypertension Mother   . Diabetes Mother   . Arthritis Paternal Uncle   . Cancer Maternal Grandfather   . Hypertension Maternal Grandmother   . Diabetes Maternal Grandmother   . Diabetes Other   . Diabetes Other   . Diabetes Paternal Grandfather   . Anesthesia problems Neg Hx   . Hypotension Neg Hx   . Malignant hyperthermia Neg Hx   . Pseudochol deficiency Neg Hx   . Heart attack Neg Hx   . Stroke Neg Hx    Social History   Tobacco Use  . Smoking status: Former Smoker    Packs/day: 0.25    Years: 15.00    Pack years: 3.75    Types: Cigarettes    Quit date: 09/04/2003    Years since quitting: 14.9  . Smokeless tobacco: Current User    Types: Chew  Substance Use Topics  . Alcohol use: Yes    Alcohol/week: 1.0 - 2.0 standard drinks    Types: 1 - 2 Cans of  beer per week    Comment: rare  . Drug use: No    ROS:  General: Negative for fever, chills, fatigue, weakness.see hpi ENT: Negative for hoarseness, difficulty swallowing , nasal congestion. CV: Negative for chest pain, angina, palpitations, dyspnea on exertion, peripheral edema.  Respiratory: Negative for dyspnea at rest, dyspnea on exertion, cough, sputum, wheezing.  GI: See history of present illness. GU:  Negative for dysuria, hematuria, urinary incontinence, urinary frequency, nocturnal urination.  Endo: Negative for unusual weight change. See hpi   Physical Examination:   BP 98/68   Pulse 61   Temp 97.8 F (36.6 C) (Oral)   Ht 6\' 1"  (1.854 m)   Wt 251 lb 3.2 oz (113.9 kg)   BMI 33.14 kg/m   General: Well-nourished, well-developed in no acute distress.  Eyes: No icterus. Mouth: Oropharyngeal mucosa moist and pink , no lesions erythema or exudate. Lungs: Clear to auscultation bilaterally.  Heart: Regular rate and rhythm, no murmurs rubs or gallops.  Abdomen: Bowel sounds are normal, mild rlq tenderness, moderate tenderness ruq, nondistended, no hepatosplenomegaly or masses, no abdominal bruits or hernia , no rebound or guarding.   Extremities: No lower extremity edema. No clubbing or deformities. Neuro: Alert and oriented x 4   Skin: Warm and dry, no jaundice.   Psych: Alert and cooperative, normal mood and affect.  Labs:  See HPI Imaging Studies: No results found.

## 2018-08-29 NOTE — Telephone Encounter (Signed)
Called patient. He is scheduled for EGD 9/2 at 11:00am. Patient aware will also have to go for COVID-19 testing. appt scheduled for 8/31 at 3pm. Patient aware of testing location. Orders entered.

## 2018-08-29 NOTE — Telephone Encounter (Signed)
Spoke to patient.  Discussed recommendations as discussed with Dr. Gala Romney.  1. Per Dr. Gala Romney: Schedule for EGD with propofol in 3-4 weeks from now.  I have discussed the risks, alternatives, benefits with regards to but not limited to the risk of reaction to medication, bleeding, infection, perforation and the patient is agreeable to proceed. Written consent to be obtained. 2. Start pantoprazole 40mg  daily before breakfast. RX sent.  3. Patient will eat fatty meal for breakfast, if develops significant pain, then go to lab six hours later to [facility]. Patient aware.  4. Call in two weeks, if symptoms resolved, then per RMR, can cancel EGD if needed.

## 2018-08-31 DIAGNOSIS — R109 Unspecified abdominal pain: Secondary | ICD-10-CM | POA: Diagnosis not present

## 2018-09-01 LAB — HEPATIC FUNCTION PANEL
AG Ratio: 2.1 (calc) (ref 1.0–2.5)
ALT: 25 U/L (ref 9–46)
AST: 18 U/L (ref 10–40)
Albumin: 4.5 g/dL (ref 3.6–5.1)
Alkaline phosphatase (APISO): 60 U/L (ref 36–130)
Bilirubin, Direct: 0.2 mg/dL (ref 0.0–0.2)
Globulin: 2.1 g/dL (calc) (ref 1.9–3.7)
Indirect Bilirubin: 0.8 mg/dL (calc) (ref 0.2–1.2)
Total Bilirubin: 1 mg/dL (ref 0.2–1.2)
Total Protein: 6.6 g/dL (ref 6.1–8.1)

## 2018-09-01 LAB — LIPASE: Lipase: 33 U/L (ref 7–60)

## 2018-09-04 ENCOUNTER — Other Ambulatory Visit: Payer: Self-pay | Admitting: Cardiovascular Disease

## 2018-09-04 NOTE — Progress Notes (Signed)
CC'D TO PCP °

## 2018-09-23 DIAGNOSIS — G4733 Obstructive sleep apnea (adult) (pediatric): Secondary | ICD-10-CM | POA: Diagnosis not present

## 2018-09-25 ENCOUNTER — Other Ambulatory Visit (HOSPITAL_COMMUNITY)
Admission: RE | Admit: 2018-09-25 | Discharge: 2018-09-25 | Disposition: A | Discharge status: Home / Self Care | Payer: PPO | Source: Ambulatory Visit | Attending: Internal Medicine | Admitting: Internal Medicine

## 2018-09-25 ENCOUNTER — Other Ambulatory Visit: Payer: Self-pay

## 2018-09-25 DIAGNOSIS — M069 Rheumatoid arthritis, unspecified: Secondary | ICD-10-CM | POA: Diagnosis not present

## 2018-09-25 DIAGNOSIS — E782 Mixed hyperlipidemia: Secondary | ICD-10-CM | POA: Diagnosis not present

## 2018-09-25 DIAGNOSIS — Z20822 Contact with and (suspected) exposure to covid-19: Secondary | ICD-10-CM

## 2018-09-25 DIAGNOSIS — Z01812 Encounter for preprocedural laboratory examination: Secondary | ICD-10-CM | POA: Diagnosis not present

## 2018-09-25 DIAGNOSIS — Z20828 Contact with and (suspected) exposure to other viral communicable diseases: Secondary | ICD-10-CM | POA: Diagnosis not present

## 2018-09-25 DIAGNOSIS — I10 Essential (primary) hypertension: Secondary | ICD-10-CM | POA: Diagnosis not present

## 2018-09-25 LAB — SARS CORONAVIRUS 2 (TAT 6-24 HRS): SARS Coronavirus 2: NEGATIVE

## 2018-09-26 ENCOUNTER — Encounter (HOSPITAL_COMMUNITY)
Admission: RE | Admit: 2018-09-26 | Discharge: 2018-09-26 | Disposition: A | Discharge status: Home / Self Care | Payer: PPO | Source: Ambulatory Visit | Attending: Internal Medicine | Admitting: Internal Medicine

## 2018-09-26 ENCOUNTER — Encounter (HOSPITAL_COMMUNITY): Payer: Self-pay

## 2018-09-26 ENCOUNTER — Other Ambulatory Visit: Payer: Self-pay

## 2018-09-26 ENCOUNTER — Telehealth: Payer: Self-pay

## 2018-09-26 DIAGNOSIS — Z87891 Personal history of nicotine dependence: Secondary | ICD-10-CM | POA: Diagnosis not present

## 2018-09-26 DIAGNOSIS — E785 Hyperlipidemia, unspecified: Secondary | ICD-10-CM | POA: Diagnosis not present

## 2018-09-26 DIAGNOSIS — F329 Major depressive disorder, single episode, unspecified: Secondary | ICD-10-CM | POA: Diagnosis not present

## 2018-09-26 DIAGNOSIS — Z6833 Body mass index (BMI) 33.0-33.9, adult: Secondary | ICD-10-CM | POA: Diagnosis not present

## 2018-09-26 DIAGNOSIS — F419 Anxiety disorder, unspecified: Secondary | ICD-10-CM | POA: Diagnosis not present

## 2018-09-26 DIAGNOSIS — I10 Essential (primary) hypertension: Secondary | ICD-10-CM | POA: Diagnosis not present

## 2018-09-26 DIAGNOSIS — K210 Gastro-esophageal reflux disease with esophagitis: Secondary | ICD-10-CM | POA: Diagnosis not present

## 2018-09-26 DIAGNOSIS — G2581 Restless legs syndrome: Secondary | ICD-10-CM | POA: Diagnosis not present

## 2018-09-26 DIAGNOSIS — Z9049 Acquired absence of other specified parts of digestive tract: Secondary | ICD-10-CM | POA: Diagnosis not present

## 2018-09-26 DIAGNOSIS — E669 Obesity, unspecified: Secondary | ICD-10-CM | POA: Diagnosis not present

## 2018-09-26 DIAGNOSIS — Z8249 Family history of ischemic heart disease and other diseases of the circulatory system: Secondary | ICD-10-CM | POA: Diagnosis not present

## 2018-09-26 DIAGNOSIS — Z7982 Long term (current) use of aspirin: Secondary | ICD-10-CM | POA: Diagnosis not present

## 2018-09-26 DIAGNOSIS — R0683 Snoring: Secondary | ICD-10-CM | POA: Diagnosis not present

## 2018-09-26 DIAGNOSIS — M069 Rheumatoid arthritis, unspecified: Secondary | ICD-10-CM | POA: Diagnosis not present

## 2018-09-26 DIAGNOSIS — D175 Benign lipomatous neoplasm of intra-abdominal organs: Secondary | ICD-10-CM | POA: Diagnosis not present

## 2018-09-26 DIAGNOSIS — K760 Fatty (change of) liver, not elsewhere classified: Secondary | ICD-10-CM | POA: Diagnosis not present

## 2018-09-26 DIAGNOSIS — Z87442 Personal history of urinary calculi: Secondary | ICD-10-CM | POA: Diagnosis not present

## 2018-09-26 DIAGNOSIS — I252 Old myocardial infarction: Secondary | ICD-10-CM | POA: Diagnosis not present

## 2018-09-26 DIAGNOSIS — M5134 Other intervertebral disc degeneration, thoracic region: Secondary | ICD-10-CM | POA: Diagnosis not present

## 2018-09-26 DIAGNOSIS — J849 Interstitial pulmonary disease, unspecified: Secondary | ICD-10-CM | POA: Diagnosis not present

## 2018-09-26 DIAGNOSIS — R109 Unspecified abdominal pain: Secondary | ICD-10-CM

## 2018-09-26 DIAGNOSIS — R1011 Right upper quadrant pain: Secondary | ICD-10-CM | POA: Diagnosis not present

## 2018-09-26 DIAGNOSIS — Z79899 Other long term (current) drug therapy: Secondary | ICD-10-CM | POA: Diagnosis not present

## 2018-09-26 DIAGNOSIS — Z86010 Personal history of colonic polyps: Secondary | ICD-10-CM | POA: Diagnosis not present

## 2018-09-26 DIAGNOSIS — Z833 Family history of diabetes mellitus: Secondary | ICD-10-CM | POA: Diagnosis not present

## 2018-09-26 HISTORY — DX: Sleep apnea, unspecified: G47.30

## 2018-09-26 LAB — BASIC METABOLIC PANEL
Anion gap: 10 (ref 5–15)
BUN: 21 mg/dL — ABNORMAL HIGH (ref 6–20)
CO2: 23 mmol/L (ref 22–32)
Calcium: 8.9 mg/dL (ref 8.9–10.3)
Chloride: 106 mmol/L (ref 98–111)
Creatinine, Ser: 0.91 mg/dL (ref 0.61–1.24)
GFR calc Af Amer: >60 mL/min (ref >60)
GFR calc non Af Amer: >60 mL/min (ref >60)
Glucose, Bld: 94 mg/dL (ref 70–99)
Potassium: 3.3 mmol/L — ABNORMAL LOW (ref 3.5–5.1)
Sodium: 139 mmol/L (ref 135–145)

## 2018-09-26 NOTE — Telephone Encounter (Signed)
Melanie at [facility] called office, pt's last TCS was done with Propofol. EGD for tomorrow should be with Propofol. EGD can be done tomorrow but moved to 1:30pm. Endo scheduler said pt can have pre-op appt today at 1:45pm.   Called and informed pt of time change for procedure and advised him he can have clear liquids only tomorrow morning until 9:00am. NPO after 9:00am. He will go for pre-op appt today. Endo scheduler informed. New orders entered.

## 2018-09-26 NOTE — Patient Instructions (Signed)
DORANCE Richard Simpson  09/26/2018     @PREFPERIOPPHARMACY @   Your procedure is scheduled on 09/27/2018.  Report to [facility] at  [facility] this number if you have problems the morning of surgery:  [phone]   Remember:   Follow the diet instructions given to you by Dr Roseanne Kaufman office.                    Take these medicines the morning of surgery with A SIP OF WATER   Amlodipine, medrol, metoprolol, cellcept, protonix, tramadol(if needed).    Do not wear jewelry, make-up or nail polish.  Do not wear lotions, powders, or perfumes. Please wear deodorant and brush your teeth.  Do not shave 48 hours prior to surgery.  Men may shave face and neck.  Do not bring valuables to the hospital.  [facility] is not responsible for any belongings or valuables.  Contacts, dentures or bridgework may not be worn into surgery.  Leave your suitcase in the car.  After surgery it may be brought to your room.  For patients admitted to the hospital, discharge time will be determined by your treatment team.  Patients discharged the day of surgery will not be allowed to drive home.   Name and phone number of your driver:   family Special instructions:  None  Please read over the following fact sheets that you were given. Anesthesia Post-op Instructions and Care and Recovery After Surgery       Upper Endoscopy, Adult, Care After This sheet gives you information about how to care for yourself after your procedure. Your health care provider may also give you more specific instructions. If you have problems or questions, contact your health care provider. What can I expect after the procedure? After the procedure, it is common to have:  A sore throat.  Mild stomach pain or discomfort.  Bloating.  Nausea. Follow these instructions at home:   Follow instructions from your health care provider about what to eat or drink after your procedure.  Return to your normal activities as told  by your health care provider. Ask your health care provider what activities are safe for you.  Take over-the-counter and prescription medicines only as told by your health care provider.  Do not drive for 24 hours if you were given a sedative during your procedure.  Keep all follow-up visits as told by your health care provider. This is important. Contact a health care provider if you have:  A sore throat that lasts longer than one day.  Trouble swallowing. Get help right away if:  You vomit blood or your vomit looks like coffee grounds.  You have: ? A fever. ? Bloody, black, or tarry stools. ? A severe sore throat or you cannot swallow. ? Difficulty breathing. ? Severe pain in your chest or abdomen. Summary  After the procedure, it is common to have a sore throat, mild stomach discomfort, bloating, and nausea.  Do not drive for 24 hours if you were given a sedative during the procedure.  Follow instructions from your health care provider about what to eat or drink after your procedure.  Return to your normal activities as told by your health care provider. This information is not intended to replace advice given to you by your health care provider. Make sure you discuss any questions you have with your health care provider. Document Released: 07/13/2011 Document Revised: 07/05/2017 Document Reviewed:  06/13/2017 Elsevier Patient Education  [place] After These instructions provide you with information about caring for yourself after your procedure. Your health care provider may also give you more specific instructions. Your treatment has been planned according to current medical practices, but problems sometimes occur. Call your health care provider if you have any problems or questions after your procedure. What can I expect after the procedure? After your procedure, you may:  Feel sleepy for several hours.  Feel clumsy and have  poor balance for several hours.  Feel forgetful about what happened after the procedure.  Have poor judgment for several hours.  Feel nauseous or vomit.  Have a sore throat if you had a breathing tube during the procedure. Follow these instructions at home: For at least 24 hours after the procedure:      Have a responsible adult stay with you. It is important to have someone help care for you until you are awake and alert.  Rest as needed.  Do not: ? Participate in activities in which you could fall or become injured. ? Drive. ? Use heavy machinery. ? Drink alcohol. ? Take sleeping pills or medicines that cause drowsiness. ? Make important decisions or sign legal documents. ? Take care of children on your own. Eating and drinking  Follow the diet that is recommended by your health care provider.  If you vomit, drink water, juice, or soup when you can drink without vomiting.  Make sure you have little or no nausea before eating solid foods. General instructions  Take over-the-counter and prescription medicines only as told by your health care provider.  If you have sleep apnea, surgery and certain medicines can increase your risk for breathing problems. Follow instructions from your health care provider about wearing your sleep device: ? Anytime you are sleeping, including during daytime naps. ? While taking prescription pain medicines, sleeping medicines, or medicines that make you drowsy.  If you smoke, do not smoke without supervision.  Keep all follow-up visits as told by your health care provider. This is important. Contact a health care provider if:  You keep feeling nauseous or you keep vomiting.  You feel light-headed.  You develop a rash.  You have a fever. Get help right away if:  You have trouble breathing. Summary  For several hours after your procedure, you may feel sleepy and have poor judgment.  Have a responsible adult stay with you for at  least 24 hours or until you are awake and alert. This information is not intended to replace advice given to you by your health care provider. Make sure you discuss any questions you have with your health care provider. Document Released: 05/04/2015 Document Revised: 04/11/2017 Document Reviewed: 05/04/2015 Elsevier Patient Education  2020 [employer].

## 2018-09-27 ENCOUNTER — Ambulatory Visit (HOSPITAL_COMMUNITY): Payer: PPO | Admitting: Anesthesiology

## 2018-09-27 ENCOUNTER — Encounter (HOSPITAL_COMMUNITY): Admission: RE | Payer: Self-pay | Source: Home / Self Care

## 2018-09-27 ENCOUNTER — Ambulatory Visit (HOSPITAL_COMMUNITY)
Admission: RE | Admit: 2018-09-27 | Discharge: 2018-09-27 | Disposition: A | Discharge status: Home / Self Care | Payer: PPO | Attending: Internal Medicine | Admitting: Internal Medicine

## 2018-09-27 ENCOUNTER — Ambulatory Visit (HOSPITAL_COMMUNITY): Admission: RE | Admit: 2018-09-27 | Payer: PPO | Source: Home / Self Care | Admitting: Internal Medicine

## 2018-09-27 ENCOUNTER — Encounter (HOSPITAL_COMMUNITY)
Admission: RE | Disposition: A | Discharge status: Home / Self Care | Payer: Self-pay | Source: Home / Self Care | Attending: Internal Medicine

## 2018-09-27 ENCOUNTER — Encounter (HOSPITAL_COMMUNITY): Payer: Self-pay | Admitting: *Deleted

## 2018-09-27 DIAGNOSIS — J849 Interstitial pulmonary disease, unspecified: Secondary | ICD-10-CM | POA: Diagnosis not present

## 2018-09-27 DIAGNOSIS — F419 Anxiety disorder, unspecified: Secondary | ICD-10-CM | POA: Diagnosis not present

## 2018-09-27 DIAGNOSIS — Z7982 Long term (current) use of aspirin: Secondary | ICD-10-CM | POA: Insufficient documentation

## 2018-09-27 DIAGNOSIS — D175 Benign lipomatous neoplasm of intra-abdominal organs: Secondary | ICD-10-CM | POA: Diagnosis not present

## 2018-09-27 DIAGNOSIS — E785 Hyperlipidemia, unspecified: Secondary | ICD-10-CM | POA: Diagnosis not present

## 2018-09-27 DIAGNOSIS — F329 Major depressive disorder, single episode, unspecified: Secondary | ICD-10-CM | POA: Diagnosis not present

## 2018-09-27 DIAGNOSIS — M5134 Other intervertebral disc degeneration, thoracic region: Secondary | ICD-10-CM | POA: Insufficient documentation

## 2018-09-27 DIAGNOSIS — I25119 Atherosclerotic heart disease of native coronary artery with unspecified angina pectoris: Secondary | ICD-10-CM | POA: Insufficient documentation

## 2018-09-27 DIAGNOSIS — I10 Essential (primary) hypertension: Secondary | ICD-10-CM | POA: Diagnosis not present

## 2018-09-27 DIAGNOSIS — R51 Headache: Secondary | ICD-10-CM | POA: Insufficient documentation

## 2018-09-27 DIAGNOSIS — Z87442 Personal history of urinary calculi: Secondary | ICD-10-CM | POA: Insufficient documentation

## 2018-09-27 DIAGNOSIS — Z86010 Personal history of colonic polyps: Secondary | ICD-10-CM | POA: Insufficient documentation

## 2018-09-27 DIAGNOSIS — Z833 Family history of diabetes mellitus: Secondary | ICD-10-CM | POA: Insufficient documentation

## 2018-09-27 DIAGNOSIS — Z6833 Body mass index (BMI) 33.0-33.9, adult: Secondary | ICD-10-CM | POA: Insufficient documentation

## 2018-09-27 DIAGNOSIS — Z79899 Other long term (current) drug therapy: Secondary | ICD-10-CM | POA: Insufficient documentation

## 2018-09-27 DIAGNOSIS — K209 Esophagitis, unspecified: Secondary | ICD-10-CM

## 2018-09-27 DIAGNOSIS — K210 Gastro-esophageal reflux disease with esophagitis: Secondary | ICD-10-CM | POA: Insufficient documentation

## 2018-09-27 DIAGNOSIS — M069 Rheumatoid arthritis, unspecified: Secondary | ICD-10-CM | POA: Insufficient documentation

## 2018-09-27 DIAGNOSIS — Z8249 Family history of ischemic heart disease and other diseases of the circulatory system: Secondary | ICD-10-CM | POA: Insufficient documentation

## 2018-09-27 DIAGNOSIS — E669 Obesity, unspecified: Secondary | ICD-10-CM | POA: Insufficient documentation

## 2018-09-27 DIAGNOSIS — R1011 Right upper quadrant pain: Secondary | ICD-10-CM | POA: Diagnosis not present

## 2018-09-27 DIAGNOSIS — Z955 Presence of coronary angioplasty implant and graft: Secondary | ICD-10-CM | POA: Insufficient documentation

## 2018-09-27 DIAGNOSIS — R0683 Snoring: Secondary | ICD-10-CM | POA: Insufficient documentation

## 2018-09-27 DIAGNOSIS — K319 Disease of stomach and duodenum, unspecified: Secondary | ICD-10-CM | POA: Diagnosis not present

## 2018-09-27 DIAGNOSIS — I252 Old myocardial infarction: Secondary | ICD-10-CM | POA: Insufficient documentation

## 2018-09-27 DIAGNOSIS — Z809 Family history of malignant neoplasm, unspecified: Secondary | ICD-10-CM | POA: Insufficient documentation

## 2018-09-27 DIAGNOSIS — G2581 Restless legs syndrome: Secondary | ICD-10-CM | POA: Insufficient documentation

## 2018-09-27 DIAGNOSIS — Z8261 Family history of arthritis: Secondary | ICD-10-CM | POA: Insufficient documentation

## 2018-09-27 DIAGNOSIS — R109 Unspecified abdominal pain: Secondary | ICD-10-CM

## 2018-09-27 DIAGNOSIS — Z87891 Personal history of nicotine dependence: Secondary | ICD-10-CM | POA: Insufficient documentation

## 2018-09-27 DIAGNOSIS — G4730 Sleep apnea, unspecified: Secondary | ICD-10-CM | POA: Insufficient documentation

## 2018-09-27 DIAGNOSIS — Z9049 Acquired absence of other specified parts of digestive tract: Secondary | ICD-10-CM | POA: Insufficient documentation

## 2018-09-27 DIAGNOSIS — K760 Fatty (change of) liver, not elsewhere classified: Secondary | ICD-10-CM | POA: Insufficient documentation

## 2018-09-27 HISTORY — PX: BIOPSY: SHX5522

## 2018-09-27 HISTORY — PX: ESOPHAGOGASTRODUODENOSCOPY (EGD) WITH PROPOFOL: SHX5813

## 2018-09-27 SURGERY — ESOPHAGOGASTRODUODENOSCOPY (EGD) WITH PROPOFOL
Anesthesia: General

## 2018-09-27 SURGERY — EGD (ESOPHAGOGASTRODUODENOSCOPY)
Anesthesia: Moderate Sedation

## 2018-09-27 MED ORDER — MIDAZOLAM HCL 2 MG/2ML IJ SOLN: 0.5000 mg | Freq: Once | INTRAMUSCULAR | Status: DC | PRN

## 2018-09-27 MED ORDER — LIDOCAINE HCL (CARDIAC) PF 100 MG/5ML IV SOSY
PREFILLED_SYRINGE | INTRAVENOUS | Status: DC | PRN
  Administered 2018-09-27: 40 mg via INTRAVENOUS

## 2018-09-27 MED ORDER — CHLORHEXIDINE GLUCONATE CLOTH 2 % EX PADS: 6.0000 | MEDICATED_PAD | Freq: Once | CUTANEOUS | Status: DC

## 2018-09-27 MED ORDER — PROMETHAZINE HCL 25 MG/ML IJ SOLN: 6.2500 mg | INTRAMUSCULAR | Status: DC | PRN

## 2018-09-27 MED ORDER — PROPOFOL 500 MG/50ML IV EMUL
INTRAVENOUS | Status: DC | PRN
  Administered 2018-09-27: 13:00:00 via INTRAVENOUS
  Administered 2018-09-27: 150 ug/kg/min via INTRAVENOUS

## 2018-09-27 MED ORDER — HYDROCODONE-ACETAMINOPHEN 7.5-325 MG PO TABS: 1.0000 | ORAL_TABLET | Freq: Once | ORAL | Status: DC | PRN

## 2018-09-27 MED ORDER — GLYCOPYRROLATE 0.2 MG/ML IJ SOLN
INTRAMUSCULAR | Status: DC | PRN
  Administered 2018-09-27: 0.2 mg via INTRAVENOUS

## 2018-09-27 MED ORDER — HYDROMORPHONE HCL 1 MG/ML IJ SOLN: 0.2500 mg | INTRAMUSCULAR | Status: DC | PRN

## 2018-09-27 MED ORDER — PROPOFOL 10 MG/ML IV BOLUS
INTRAVENOUS | Status: DC | PRN
  Administered 2018-09-27: 20 mg via INTRAVENOUS

## 2018-09-27 MED ORDER — LACTATED RINGERS IV SOLN
INTRAVENOUS | Status: DC
  Administered 2018-09-27: 13:00:00 via INTRAVENOUS

## 2018-09-27 MED ORDER — SODIUM CHLORIDE 0.9 % IV SOLN: INTRAVENOUS | Status: DC

## 2018-09-27 MED ORDER — STERILE WATER FOR IRRIGATION IR SOLN
Status: DC | PRN
  Administered 2018-09-27: 1.5 mL

## 2018-09-27 NOTE — Transfer of Care (Signed)
Immediate Anesthesia Transfer of Care Note  Patient: YAZEN CHARVAT  Procedure(s) Performed: ESOPHAGOGASTRODUODENOSCOPY (EGD) WITH PROPOFOL (N/A ) BIOPSY  Patient Location: PACU  Anesthesia Type:General  Level of Consciousness: awake, alert , oriented and patient cooperative  Airway & Oxygen Therapy: Patient Spontanous Breathing  Post-op Assessment: Report given to RN and Post -op Vital signs reviewed and stable  Post vital signs: Reviewed and stable  Last Vitals:  Vitals Value Taken Time  BP    Temp    Pulse 74 09/27/18 1332  Resp    SpO2 94 % 09/27/18 1332  Vitals shown include unvalidated device data.  Last Pain:  Vitals:   09/27/18 1227  TempSrc: Oral  PainSc: 3       Patients Stated Pain Goal: 7 ([id number])  Complications: No apparent anesthesia complications

## 2018-09-27 NOTE — Anesthesia Postprocedure Evaluation (Signed)
Anesthesia Post Note  Patient: Richard Simpson  Procedure(s) Performed: ESOPHAGOGASTRODUODENOSCOPY (EGD) WITH PROPOFOL (N/A ) BIOPSY  Patient location during evaluation: PACU Anesthesia Type: General Level of consciousness: awake and alert, oriented and patient cooperative Pain management: pain level controlled Vital Signs Assessment: post-procedure vital signs reviewed and stable Respiratory status: spontaneous breathing Cardiovascular status: stable Postop Assessment: no apparent nausea or vomiting Anesthetic complications: no     Last Vitals:  Vitals:   09/27/18 1227  BP: 120/89  Pulse: 63  Resp: 13  Temp: 36.8 C  SpO2: 97%    Last Pain:  Vitals:   09/27/18 1227  TempSrc: Oral  PainSc: 3                  Indy Prestwood A

## 2018-09-27 NOTE — Interval H&P Note (Signed)
History and Physical Interval Note:  09/27/2018 12:35 PM  Richard Simpson  has presented today for surgery, with the diagnosis of right side abdominal pain.  The various methods of treatment have been discussed with the patient and family. After consideration of risks, benefits and other options for treatment, the patient has consented to  Procedure(s) with comments: ESOPHAGOGASTRODUODENOSCOPY (EGD) WITH PROPOFOL (N/A) - 1:30pm as a surgical intervention.  The patient's history has been reviewed, patient examined, no change in status, stable for surgery.  I have reviewed the patient's chart and labs.  Questions were answered to the patient's satisfaction.     Richard Simpson  Patient essentially unchanged.  No improvement with acid suppression therapy.  He does have localized epigastric medial right upper quadrant tenderness to palpation.  No dysphagia.  Diagnostic EGD today per plan.  The risks, benefits, limitations, alternatives and imponderables have been reviewed with the patient. Potential for esophageal dilation, biopsy, etc. have also been reviewed.  Questions have been answered. All parties agreeable.

## 2018-09-27 NOTE — Anesthesia Procedure Notes (Signed)
Procedure Name: General with mask airway Date/Time: 09/27/2018 12:56 PM Performed by: Andree Elk, Amy A, CRNA Pre-anesthesia Checklist: Timeout performed, Patient being monitored, Suction available, Emergency Drugs available and Patient identified Oxygen Delivery Method: Non-rebreather mask

## 2018-09-27 NOTE — Addendum Note (Signed)
Addendum  created 09/27/18 1344 by Mickel Baas, CRNA   Charge Capture section accepted

## 2018-09-27 NOTE — Discharge Instructions (Signed)
EGD Discharge instructions Please read the instructions outlined below and refer to this sheet in the next few weeks. These discharge instructions provide you with general information on caring for yourself after you leave the hospital. Your doctor may also give you specific instructions. While your treatment has been planned according to the most current medical practices available, unavoidable complications occasionally occur. If you have any problems or questions after discharge, please call your doctor. ACTIVITY  You may resume your regular activity but move at a slower pace for the next 24 hours.   Take frequent rest periods for the next 24 hours.   Walking will help expel (get rid of) the air and reduce the bloated feeling in your abdomen.   No driving for 24 hours (because of the anesthesia (medicine) used during the test).   You may shower.   Do not sign any important legal documents or operate any machinery for 24 hours (because of the anesthesia used during the test).  NUTRITION  Drink plenty of fluids.   You may resume your normal diet.   Begin with a light meal and progress to your normal diet.   Avoid alcoholic beverages for 24 hours or as instructed by your caregiver.  MEDICATIONS  You may resume your normal medications unless your caregiver tells you otherwise.  WHAT YOU CAN EXPECT TODAY  You may experience abdominal discomfort such as a feeling of fullness or gas pains.  FOLLOW-UP  Your doctor will discuss the results of your test with you.  SEEK IMMEDIATE MEDICAL ATTENTION IF ANY OF THE FOLLOWING OCCUR:  Excessive nausea (feeling sick to your stomach) and/or vomiting.   Severe abdominal pain and distention (swelling).   Trouble swallowing.   Temperature over 101 F (37.8 C).   Rectal bleeding or vomiting of blood.    Monitored Anesthesia Care, Care After These instructions provide you with information about caring for yourself after your procedure.  Your health care provider may also give you more specific instructions. Your treatment has been planned according to current medical practices, but problems sometimes occur. Call your health care provider if you have any problems or questions after your procedure. What can I expect after the procedure? After your procedure, you may:  Feel sleepy for several hours.  Feel clumsy and have poor balance for several hours.  Feel forgetful about what happened after the procedure.  Have poor judgment for several hours.  Feel nauseous or vomit.  Have a sore throat if you had a breathing tube during the procedure. Follow these instructions at home: For at least 24 hours after the procedure:      Have a responsible adult stay with you. It is important to have someone help care for you until you are awake and alert.  Rest as needed.  Do not: ? Participate in activities in which you could fall or become injured. ? Drive. ? Use heavy machinery. ? Drink alcohol. ? Take sleeping pills or medicines that cause drowsiness. ? Make important decisions or sign legal documents. ? Take care of children on your own. Eating and drinking  Follow the diet that is recommended by your health care provider.  If you vomit, drink water, juice, or soup when you can drink without vomiting.  Make sure you have little or no nausea before eating solid foods. General instructions  Take over-the-counter and prescription medicines only as told by your health care provider.  If you have sleep apnea, surgery and certain medicines can increase  your risk for breathing problems. Follow instructions from your health care provider about wearing your sleep device: ? Anytime you are sleeping, including during daytime naps. ? While taking prescription pain medicines, sleeping medicines, or medicines that make you drowsy.  If you smoke, do not smoke without supervision.  Keep all follow-up visits as told by your  health care provider. This is important. Contact a health care provider if:  You keep feeling nauseous or you keep vomiting.  You feel light-headed.  You develop a rash.  You have a fever. Get help right away if:  You have trouble breathing. Summary  For several hours after your procedure, you may feel sleepy and have poor judgment.  Have a responsible adult stay with you for at least 24 hours or until you are awake and alert. This information is not intended to replace advice given to you by your health care provider. Make sure you discuss any questions you have with your health care provider. Document Released: 05/04/2015 Document Revised: 04/11/2017 Document Reviewed: 05/04/2015 Elsevier Patient Education  [employer].   I will call you when I get the biopsies back for review  No change in medications for the time being  Discussed findings with son Rodman Key and left message on voicemail with wife, [name]

## 2018-09-27 NOTE — Anesthesia Preprocedure Evaluation (Signed)
Anesthesia Evaluation  Patient identified by MRN, date of birth, ID band Patient awake    Reviewed: Allergy & Precautions, NPO status , Patient's Chart, lab work & pertinent test results, reviewed documented beta blocker date and time   Airway Mallampati: II  TM Distance: >3 FB Neck ROM: Full    Dental no notable dental hx. (+) Teeth Intact   Pulmonary shortness of breath and with exertion, sleep apnea and Continuous Positive Airway Pressure Ventilation , former smoker,    Pulmonary exam normal breath sounds clear to auscultation       Cardiovascular Exercise Tolerance: Good hypertension, Pt. on home beta blockers and Pt. on medications + angina with exertion + CAD, + Past MI and + Cardiac Stents  Normal cardiovascular examI Rhythm:Regular Rate:Normal     Neuro/Psych  Headaches, Anxiety Depression negative psych ROS   GI/Hepatic Neg liver ROS, GERD  Medicated and Controlled,  Endo/Other  negative endocrine ROS  Renal/GU negative Renal ROS  negative genitourinary   Musculoskeletal  (+) Arthritis , Rheumatoid disorders,    Abdominal   Peds negative pediatric ROS (+)  Hematology negative hematology ROS (+)   Anesthesia Other Findings   Reproductive/Obstetrics negative OB ROS                             Anesthesia Physical Anesthesia Plan  ASA: III  Anesthesia Plan: General   Post-op Pain Management:    Induction: Intravenous  PONV Risk Score and Plan: 2 and TIVA, Propofol infusion and Treatment may vary due to age or medical condition  Airway Management Planned: Nasal Cannula and Simple Face Mask  Additional Equipment:   Intra-op Plan:   Post-operative Plan:   Informed Consent: I have reviewed the patients History and Physical, chart, labs and discussed the procedure including the risks, benefits and alternatives for the proposed anesthesia with the patient or authorized  representative who has indicated his/her understanding and acceptance.     Dental advisory given  Plan Discussed with: CRNA  Anesthesia Plan Comments: (Plan Full PPE use  Plan GA with GETA as needed d/w pt -WTP with same after Q&A)        Anesthesia Quick Evaluation

## 2018-09-27 NOTE — Op Note (Addendum)
[facility] Patient Name: Richard Simpson Procedure Date: 09/27/2018 12:27 PM MRN: [id number] Date of Birth: 1970/09/16 Attending MD: Norvel Richards , MD CSN: [id number] Age: 48 Admit Type: Inpatient Procedure:                Upper GI endoscopy Indications:              Abdominal pain in the right upper quadrant Providers:                Norvel Richards, MD, Gerome Sam, RN,                            Randa Spike, Technician Referring MD:              Medicines:                Propofol per Anesthesia Complications:            No immediate complications. Estimated Blood Loss:     Estimated blood loss was minimal. Procedure:                Pre-Anesthesia Assessment:                           - Prior to the procedure, a History and Physical                            was performed, and patient medications and                            allergies were reviewed. The patient's tolerance of                            previous anesthesia was also reviewed. The risks                            and benefits of the procedure and the sedation                            options and risks were discussed with the patient.                            All questions were answered, and informed consent                            was obtained. Prior Anticoagulants: The patient has                            taken no previous anticoagulant or antiplatelet                            agents. ASA Grade Assessment: II - A patient with                            mild systemic disease. After reviewing the risks  and benefits, the patient was deemed in                            satisfactory condition to undergo the procedure.                           After obtaining informed consent, the endoscope was                            passed under direct vision. Throughout the                            procedure, the patient's blood pressure, pulse, and           oxygen saturations were monitored continuously. The                            [id number]) scope was introduced through the                            mouth, and advanced to the third part of duodenum.                            The upper GI endoscopy was accomplished without                            difficulty. The patient tolerated the procedure                            well. Scope In: 1:03:41 PM Scope Out: 1:22:04 PM Total Procedure Duration: 0 hours 18 minutes 23 seconds  Findings:      Slightly irregular "ratty" Z line. Erosions within a couple of       millimeters of the line on either side..      The entire examined stomach was normal.      The duodenal bulb and second portion of the duodenum were normal. In the       distal third portion of the duodenum there was a 2 cm soft somewhat       submucosal appearing mass. Please see photos. I biopsied the duodenal       abnormality as well as the GE junction. Impression:               - Esophagitis -not typical of reflux. Query pill                            induced injury versus nonspecific.                           - Normal stomach.                           - Normal duodenal bulb and second portion of the                            duodenum.                           -  D3 mass status post biopsy. Patient now has                            waxing and waning unrelenting right upper quadrant                            abdominal pain; Right upper quadrant tenderness on                            examination. Gallbladder - long gone; lfts normal.                            Has not really improved with acid suppression                            therapy. D3 lesion may be nothing more than a                            lipoma. Further evaluation may be warranted. Moderate Sedation:      Moderate (conscious) sedation was personally administered by an       anesthesia professional. The following parameters were  monitored: oxygen       saturation, heart rate, blood pressure, respiratory rate, EKG, adequacy       of pulmonary ventilation, and response to care. Recommendation:           - Patient has a contact number available for                            emergencies. The signs and symptoms of potential                            delayed complications were discussed with the                            patient. Return to normal activities tomorrow.                            Written discharge instructions were provided to the                            patient.                           - Resume previous diet.                           - Continue present medications. Follow-up on                            pathology.                           - Return to my office (date not yet determined). Procedure Code(s):        --- Professional ---  [id number], Esophagogastroduodenoscopy, flexible,                            transoral; diagnostic, including collection of                            specimen(s) by brushing or washing, when performed                            (separate procedure) Diagnosis Code(s):        --- Professional ---                           K20.9, Esophagitis, unspecified                           R10.11, Right upper quadrant pain CPT copyright 2019 American Medical Association. All rights reserved. The codes documented in this report are preliminary and upon coder review may  be revised to meet current compliance requirements. [name]. [name], MD Norvel Richards, MD 09/27/2018 1:56:40 PM This report has been signed electronically. Number of Addenda: 0

## 2018-09-29 ENCOUNTER — Encounter (HOSPITAL_COMMUNITY): Payer: Self-pay | Admitting: Internal Medicine

## 2018-10-04 ENCOUNTER — Other Ambulatory Visit: Payer: Self-pay

## 2018-10-04 DIAGNOSIS — R1011 Right upper quadrant pain: Secondary | ICD-10-CM

## 2018-10-04 DIAGNOSIS — I2510 Atherosclerotic heart disease of native coronary artery without angina pectoris: Secondary | ICD-10-CM | POA: Diagnosis not present

## 2018-10-04 DIAGNOSIS — Z6834 Body mass index (BMI) 34.0-34.9, adult: Secondary | ICD-10-CM | POA: Diagnosis not present

## 2018-10-04 DIAGNOSIS — M5440 Lumbago with sciatica, unspecified side: Secondary | ICD-10-CM | POA: Diagnosis not present

## 2018-10-04 DIAGNOSIS — J849 Interstitial pulmonary disease, unspecified: Secondary | ICD-10-CM | POA: Diagnosis not present

## 2018-10-04 DIAGNOSIS — E669 Obesity, unspecified: Secondary | ICD-10-CM | POA: Diagnosis not present

## 2018-10-04 DIAGNOSIS — M0609 Rheumatoid arthritis without rheumatoid factor, multiple sites: Secondary | ICD-10-CM | POA: Diagnosis not present

## 2018-10-06 ENCOUNTER — Telehealth: Payer: Self-pay | Admitting: Gastroenterology

## 2018-10-06 ENCOUNTER — Other Ambulatory Visit: Payer: Self-pay | Admitting: Cardiovascular Disease

## 2018-10-06 NOTE — Telephone Encounter (Signed)
Labs from 10/04/2018: White blood cell count 5600, hemoglobin 15.3, hematocrit 44.5, platelets 173,000, sed rate 2, CRP less than 1, glucose 89, BUN 15, creatinine 0.91, albumin 4.3, total bilirubin 0.9, alkaline phosphatase 64, AST 21, ALT 27.  Placed on Dr. Roseanne Kaufman desk for review.  Labs all normal.

## 2018-10-08 NOTE — Telephone Encounter (Signed)
Labs noted

## 2018-10-25 DIAGNOSIS — I10 Essential (primary) hypertension: Secondary | ICD-10-CM | POA: Diagnosis not present

## 2018-10-25 DIAGNOSIS — E782 Mixed hyperlipidemia: Secondary | ICD-10-CM | POA: Diagnosis not present

## 2018-10-28 DIAGNOSIS — Z23 Encounter for immunization: Secondary | ICD-10-CM | POA: Diagnosis not present

## 2018-10-28 DIAGNOSIS — L570 Actinic keratosis: Secondary | ICD-10-CM | POA: Diagnosis not present

## 2018-11-07 ENCOUNTER — Other Ambulatory Visit: Payer: Self-pay | Admitting: Cardiovascular Disease

## 2018-11-30 DIAGNOSIS — M7712 Lateral epicondylitis, left elbow: Secondary | ICD-10-CM | POA: Diagnosis not present

## 2018-11-30 DIAGNOSIS — S46212A Strain of muscle, fascia and tendon of other parts of biceps, left arm, initial encounter: Secondary | ICD-10-CM | POA: Diagnosis not present

## 2018-11-30 DIAGNOSIS — Z6834 Body mass index (BMI) 34.0-34.9, adult: Secondary | ICD-10-CM | POA: Diagnosis not present

## 2018-11-30 DIAGNOSIS — S46012A Strain of muscle(s) and tendon(s) of the rotator cuff of left shoulder, initial encounter: Secondary | ICD-10-CM | POA: Diagnosis not present

## 2018-11-30 DIAGNOSIS — M069 Rheumatoid arthritis, unspecified: Secondary | ICD-10-CM | POA: Diagnosis not present

## 2018-12-06 ENCOUNTER — Other Ambulatory Visit: Payer: Self-pay | Admitting: Cardiovascular Disease

## 2018-12-15 ENCOUNTER — Other Ambulatory Visit: Payer: Self-pay | Admitting: Cardiovascular Disease

## 2018-12-19 DIAGNOSIS — I10 Essential (primary) hypertension: Secondary | ICD-10-CM | POA: Diagnosis not present

## 2018-12-19 DIAGNOSIS — M05741 Rheumatoid arthritis with rheumatoid factor of right hand without organ or systems involvement: Secondary | ICD-10-CM | POA: Diagnosis not present

## 2018-12-19 DIAGNOSIS — E782 Mixed hyperlipidemia: Secondary | ICD-10-CM | POA: Diagnosis not present

## 2018-12-19 DIAGNOSIS — K219 Gastro-esophageal reflux disease without esophagitis: Secondary | ICD-10-CM | POA: Diagnosis not present

## 2018-12-22 DIAGNOSIS — I2510 Atherosclerotic heart disease of native coronary artery without angina pectoris: Secondary | ICD-10-CM | POA: Diagnosis not present

## 2018-12-22 DIAGNOSIS — Z0001 Encounter for general adult medical examination with abnormal findings: Secondary | ICD-10-CM | POA: Diagnosis not present

## 2018-12-22 DIAGNOSIS — M05741 Rheumatoid arthritis with rheumatoid factor of right hand without organ or systems involvement: Secondary | ICD-10-CM | POA: Diagnosis not present

## 2018-12-22 DIAGNOSIS — R7301 Impaired fasting glucose: Secondary | ICD-10-CM | POA: Diagnosis not present

## 2018-12-22 DIAGNOSIS — K219 Gastro-esophageal reflux disease without esophagitis: Secondary | ICD-10-CM | POA: Diagnosis not present

## 2018-12-22 DIAGNOSIS — Z23 Encounter for immunization: Secondary | ICD-10-CM | POA: Diagnosis not present

## 2018-12-22 DIAGNOSIS — Z6833 Body mass index (BMI) 33.0-33.9, adult: Secondary | ICD-10-CM | POA: Diagnosis not present

## 2018-12-22 DIAGNOSIS — I10 Essential (primary) hypertension: Secondary | ICD-10-CM | POA: Diagnosis not present

## 2018-12-25 ENCOUNTER — Other Ambulatory Visit: Payer: Self-pay

## 2018-12-25 ENCOUNTER — Encounter: Payer: Self-pay | Admitting: Physician Assistant

## 2018-12-25 ENCOUNTER — Ambulatory Visit: Payer: PPO | Admitting: Physician Assistant

## 2018-12-25 VITALS — BP 116/78 | HR 66 | Temp 97.7°F | Ht 73.0 in | Wt 257.2 lb

## 2018-12-25 DIAGNOSIS — I2510 Atherosclerotic heart disease of native coronary artery without angina pectoris: Secondary | ICD-10-CM

## 2018-12-25 DIAGNOSIS — E785 Hyperlipidemia, unspecified: Secondary | ICD-10-CM | POA: Diagnosis not present

## 2018-12-25 DIAGNOSIS — I10 Essential (primary) hypertension: Secondary | ICD-10-CM | POA: Diagnosis not present

## 2018-12-25 DIAGNOSIS — E782 Mixed hyperlipidemia: Secondary | ICD-10-CM | POA: Diagnosis not present

## 2018-12-25 NOTE — Progress Notes (Signed)
Cardiology Office Note:    Date:  12/27/2018   ID:  FOY LIKENS, DOB 1970-11-25, MRN [id number]  PCP:  Caryl Bis, MD  Cardiologist:  [name], MD  Electrophysiologist:  None   Referring MD: Caryl Bis, MD   Chief Complaint  Patient presents with   Follow-up    seen for Dr. Claiborne Billings.     History of Present Illness:    Richard Simpson is a 48 y.o. male with a hx of CAD s/p DES to LAD in June 2016, anxiety, depression, HLD, HTN, GERD, interstitial lung disease, ICM, RA, restless leg syndrome, and obstructive sleep apnea.  He underwent repeat cardiac catheterization in February 2017 for recurrent chest pain and found to have widely patent LAD stent.  Etiology for angina was felt to be microvascular disease versus diastolic dysfunction.  He had another cardiac catheterization in October 2018 for chest pain which again showed patent LAD with no significant residual disease.  The chest pain was nitro responsive.  Follow-up echocardiogram in December 2018 was stable.  He was last seen by Fabian Sharp 11/03/2017 at which time he was doing well.  He presents today for cardiology office visit.  He says due to the COVID-19, he has gained some weight this year however denies any significant cardiac issue.  He continues to have some chest discomfort with more strenuous activities such as hunting, however no chest discomfort with regular daily activity.  I recommended him to continue on the current cardiac medications.  He recently obtain fasting blood work at his PCPs office, I do not have the record.  I will defer lipid panel to his PCP.  He denies any lower extremity edema and he does not have any health apnea or PND.  He can follow-up in a year.   Past Medical History:  Diagnosis Date   Anxiety    CAD S/P percutaneous coronary angioplasty 10/2013   a. 10/2013 Cath: mod LAD dzs->nl FFR-> medically managed;  b. 06/2014 NSTEMI/PCi: LAD 99ost, 70p, 3m (entire area covered with a 3.5x20  Synergy DES), LCX nl, RCA 63m, 10d, EF 45-50%. c. 10/2015: cath showing patent LAD stent with 15% Prox LAD stenosis. EF 55-65%.    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 06/2014 EF 45-50% by LV gram b. Echo July 2016: EF 60-65%. No RWMA. Gr 1 DD    NSTEMI (non-ST elevated myocardial infarction) ([place]) 06/2014   Obesity (BMI 30-39.9) 10/29/2013   Primary snoring 10/29/2013   Rheumatoid arthritis ([place]) 09/23/2013   RLS (restless legs syndrome) 10/29/2013   Sleep apnea     Past Surgical History:  Procedure Laterality Date   BIOPSY  09/27/2018   Procedure: BIOPSY;  Surgeon: Daneil Dolin, MD;  [facility];  Service: Endoscopy;;   CARDIAC CATHETERIZATION N/A 07/05/2014   Procedure: Left Heart Cath and Coronary Angiography;  Surgeon: Troy Sine, MD;  Location: [place] [facility];  Service: Cardiovascular;  NSTEMI --> Ostial LAd 99%, prox 80% & early mid 50% --> PCI   CARDIAC CATHETERIZATION  07/05/2014   Procedure: Coronary Stent Intervention;  Surgeon: Marcello Moores  Floyce Stakes, MD;  Location: [place] [facility];  Service: Cardiovascular; Ostial-prox LAD tandem 99, 80 & 50% --> Synergy DES 3.5 x 20 (3.74)   CARDIAC CATHETERIZATION N/A 03/24/2015   Procedure: Left Heart Cath and Coronary Angiography;  Surgeon: Leonie Man, MD;  Location: [place] [facility];  Service: Cardiovascular;  Laterality: N/A;   CARDIAC CATHETERIZATION N/A 11/20/2015   Procedure: Left Heart Cath and Coronary Angiography;  Surgeon: Troy Sine, MD;  Location: [place] [facility];  Service: Cardiovascular;  Laterality: N/A;   CHOLECYSTECTOMY     10 yrs ago-[facility]   COLONOSCOPY N/A 03/23/2013    Procedure: COLONOSCOPY;  Surgeon: Daneil Dolin, MD;  [facility];  Service: Endoscopy;  Laterality: N/A;  2:15-moved to 1030 Staff notified pt   COLONOSCOPY WITH PROPOFOL N/A 06/10/2016   Procedure: COLONOSCOPY WITH PROPOFOL;  Surgeon: Daneil Dolin, MD;  [facility];  Service: Endoscopy;  Laterality: N/A;  730    ESOPHAGOGASTRODUODENOSCOPY (EGD) WITH PROPOFOL N/A 09/27/2018   Procedure: ESOPHAGOGASTRODUODENOSCOPY (EGD) WITH PROPOFOL;  Surgeon: Daneil Dolin, MD;  [facility];  Service: Endoscopy;  Laterality: N/A;  1:30pm   FRACTIONAL FLOW RESERVE WIRE  11/23/2013   Procedure: FRACTIONAL FLOW RESERVE WIRE;  Surgeon: Jettie Booze, MD;  Location: [facility] C[facility];  Service: Cardiovascular;;   GALLBLADDER SURGERY     LEFT HEART CATHETERIZATION WITH CORONARY ANGIOGRAM N/A 11/23/2013   Procedure: LEFT HEART CATHETERIZATION WITH CORONARY ANGIOGRAM;  Surgeon: Jettie Booze, MD;  Location: [facility] C[facility];  Service: Cardiovascular;  Laterality: N/A;   MASS EXCISION  09/09/2010   Procedure: EXCISION MASS;  Surgeon: Arther Abbott, MD;  Location: [facility];  Service: Orthopedics;  Laterality: Left;  Excision Mass Left Long Finger   POLYPECTOMY  06/10/2016   Procedure: POLYPECTOMY;  Surgeon: Daneil Dolin, MD;  [facility];  Service: Endoscopy;;  colon   THROAT SURGERY     nodules removed from throat as child   TRANSTHORACIC ECHOCARDIOGRAM   July 2016:    EF 60-65%. No RWMA. Gr 1 DD    Current Medications: Current Meds  Medication Sig   amLODipine (NORVASC) 2.5 MG tablet Take 1 tablet daily . OFFICE VISIT NEEDED   aspirin EC 81 MG tablet Take 1 tablet (81 mg total) by mouth daily.   atorvastatin (LIPITOR) 80 MG tablet TAKE (1) TABLET BY MOUTH ONCE DAILY.   clonazePAM (KLONOPIN) 1 MG tablet Take 1 mg by mouth See admin instructions. Take 1 tablet (mg) by mouth scheduled at bedtime, may take an additional dose if needed for anxiety     clopidogrel (PLAVIX) 75 MG tablet TAKE 1 TABLET BY MOUTH ONCE A DAY. (Patient taking differently: Take 75 mg by mouth daily. )   furosemide (LASIX) 20 MG tablet TAKE (1) TABLET BY MOUTH ONCE DAILY. (Patient taking differently: Take 20 mg by mouth daily as needed for fluid. )   loratadine (CLARITIN) 10 MG tablet Take 10 mg by mouth daily.    losartan (COZAAR) 25 MG tablet Take 1 tablet (25 mg total) by mouth daily.   methylPREDNISolone (MEDROL) 4 MG tablet Take 4 mg by mouth daily.    metoprolol tartrate (LOPRESSOR) 25 MG tablet Take 1 tablet (25 mg total) by mouth 2 (two) times daily. OFFICE VISIT NEEDED   mycophenolate (CELLCEPT) 500 MG tablet Take 1,000 mg by mouth 2 (two) times daily.    nitroGLYCERIN (NITROSTAT) 0.4 MG SL tablet DISSOLVE 1  TABLET UNDER TONGUE FOR CHEST TIGHTNESS AND SHORTNESS OF BREATH. MAY TAKE EVERY 5 MINUTES UP TO 3 DOSES. IF NO RELIEF AFTER 3 DOS (Patient taking differently: Place 0.4 mg under the tongue every 5 (five) minutes x 3 doses as needed for chest pain. )   pantoprazole (PROTONIX) 40 MG tablet Take 1 tablet (40 mg total) by mouth daily before breakfast.   traMADol (ULTRAM) 50 MG tablet Take 50 mg by mouth every 6 (six) hours as needed for moderate pain.      Allergies:   Lisinopril and Prednisone   Social History   Socioeconomic History   Marital status: Married    Spouse name: Renee   Number of children: 2   Years of education: 12th grade   Highest education level: Not on file  Occupational History   Occupation: Retired Chemical engineer: [employer] resource strain: Not on file   Food insecurity    Worry: Not on file    Inability: Not on file   Transportation needs    Medical: Not on file    Non-medical: Not on file  Tobacco Use   Smoking status: Former Smoker    Packs/day: 0.25    Years: 15.00    Pack years: 3.75    Types: Cigarettes    Quit date: 09/04/2003    Years since  quitting: 15.3   Smokeless tobacco: Current User    Types: Chew  Substance and Sexual Activity   Alcohol use: Yes    Alcohol/week: 1.0 - 2.0 standard drinks    Types: 1 - 2 Cans of beer per week    Comment: rare   Drug use: No   Sexual activity: Yes  Lifestyle   Physical activity    Days per week: Not on file    Minutes per session: Not on file   Stress: Not on file  Relationships   Social connections    Talks on phone: Not on file    Gets together: Not on file    Attends religious service: Not on file    Active member of club or organization: Not on file    Attends meetings of clubs or organizations: Not on file    Relationship status: Not on file  Other Topics Concern   Not on file  Social History Narrative   Patient lives at home with his wife [name]).    Patient works full time Dentist..   Education high school.   Right handed.   Caffeine one cup of coffee daily and one soda.     Family History: The patient's family history includes Arthritis in his paternal uncle; Cancer in his maternal grandfather; Diabetes in his maternal grandmother, mother, paternal grandfather, and other family members; Hypertension in his maternal grandmother and mother. There is no history of Anesthesia problems, Hypotension, Malignant hyperthermia, Pseudochol deficiency, Heart attack, or Stroke.  ROS:   Please see the history of present illness.     All other systems reviewed and are negative.  EKGs/Labs/Other Studies Reviewed:    The following studies were reviewed today:  Echo 01/06/2017 LV EF: 50% -   55% Study Conclusions  - Left ventricle: The cavity size was normal. Wall thickness was   increased in a pattern of moderate LVH. There was focal basal   hypertrophy. Systolic function was normal. The estimated ejection   fraction was in the range of 50% to 55%. Severe hypokinesis  of   the apicalanteroseptal myocardium. Doppler parameters are   consistent with  abnormal left ventricular relaxation (grade 1   diastolic dysfunction). - Aortic valve: There was trivial regurgitation.   EKG:  EKG is ordered today.  The ekg ordered today demonstrates normal sinus rhythm, no significant ST-T wave changes  Recent Labs: 08/31/2018: ALT 25 09/26/2018: BUN 21; Creatinine, Ser 0.91; Potassium 3.3; Sodium 139  Recent Lipid Panel    Component Value Date/Time   CHOL 103 04/16/2015 0820   CHOL 114 08/06/2014 0746   TRIG 70 04/16/2015 0820   HDL 28 (L) 04/16/2015 0820   HDL 28 (L) 08/06/2014 0746   CHOLHDL 3.7 04/16/2015 0820   VLDL 14 04/16/2015 0820   LDLCALC 61 04/16/2015 0820   LDLCALC 63 08/06/2014 0746    Physical Exam:    VS:  BP 116/78    Pulse 66    Temp 97.7 F (36.5 C)    Ht 6\' 1"  (1.854 m)    Wt 257 lb 3.2 oz (116.7 kg)    SpO2 96%    BMI 33.93 kg/m     Wt Readings from Last 3 Encounters:  12/25/18 257 lb 3.2 oz (116.7 kg)  09/26/18 245 lb (111.1 kg)  08/29/18 251 lb 3.2 oz (113.9 kg)     GEN:  Well nourished, well developed in no acute distress HEENT: Normal NECK: No JVD; No carotid bruits LYMPHATICS: No lymphadenopathy CARDIAC: RRR, no murmurs, rubs, gallops RESPIRATORY:  Clear to auscultation without rales, wheezing or rhonchi  ABDOMEN: Soft, non-tender, non-distended MUSCULOSKELETAL:  No edema; No deformity  SKIN: Warm and dry NEUROLOGIC:  Alert and oriented x 3 PSYCHIATRIC:  Normal affect   ASSESSMENT:    1. Coronary artery disease involving native coronary artery of native heart without angina pectoris   2. Essential hypertension   3. Hyperlipidemia LDL goal <70    PLAN:    In order of problems listed above:  1. CAD: Denies any recent significant chest discomfort, he does have chest tightness with strenuous activity such as hunting, however he does not have any chest tightness with regular daily activity.  Continue to monitor and contact cardiology if frequency or duration of the chest pain increases with lighter  activity.  2. Hypertension: Blood pressure stable on current therapy  3. Hyperlipidemia: Continue statin therapy, he recently obtained a fasting lipid panel at his PCPs office.   Medication Adjustments/Labs and Tests Ordered: Current medicines are reviewed at length with the patient today.  Concerns regarding medicines are outlined above.  Orders Placed This Encounter  Procedures   EKG 12-Lead   No orders of the defined types were placed in this encounter.   Patient Instructions  Medication Instructions:   Your physician recommends that you continue on your current medications as directed. Please refer to the Current Medication list given to you today.  *If you need a refill on your cardiac medications before your next appointment, please call your pharmacy*  Lab Work:  NONE ordered at this time of appointment   If you have labs (blood work) drawn today and your tests are completely normal, you will receive your results only by:  [place] (if you have MyChart) OR  A paper copy in the mail If you have any lab test that is abnormal or we need to change your treatment, we will call you to review the results.  Testing/Procedures:  NONE ordered at this time of appointment   Follow-Up: At [facility], you  and your health needs are our priority.  As part of our continuing mission to provide you with exceptional heart care, we have created designated Provider Care Teams.  These Care Teams include your primary Cardiologist (physician) and Advanced Practice Providers (APPs -  Physician Assistants and Nurse Practitioners) who all work together to provide you with the care you need, when you need it.  Your next appointment:   12 month(s)   The format for your next appointment:   In Person  Provider:   [name], MD  Other Instructions      Signed, Almyra Deforest, [place]  12/27/2018 11:39 PM    [place]

## 2018-12-25 NOTE — Patient Instructions (Signed)
Medication Instructions:  Your physician recommends that you continue on your current medications as directed. Please refer to the Current Medication list given to you today.  *If you need a refill on your cardiac medications before your next appointment, please call your pharmacy*  Lab Work: NONE ordered at this time of appointment   If you have labs (blood work) drawn today and your tests are completely normal, you will receive your results only by: MyChart Message (if you have MyChart) OR A paper copy in the mail If you have any lab test that is abnormal or we need to change your treatment, we will call you to review the results.  Testing/Procedures: NONE ordered at this time of appointment   Follow-Up: At [facility], you and your health needs are our priority.  As part of our continuing mission to provide you with exceptional heart care, we have created designated Provider Care Teams.  These Care Teams include your primary Cardiologist (physician) and Advanced Practice Providers (APPs -  Physician Assistants and Nurse Practitioners) who all work together to provide you with the care you need, when you need it.  Your next appointment:   12 month(s)  The format for your next appointment:   In Person  Provider:   Thomas Kelly, MD   Other Instructions   

## 2018-12-27 ENCOUNTER — Encounter: Payer: Self-pay | Admitting: Physician Assistant

## 2019-01-03 DIAGNOSIS — E669 Obesity, unspecified: Secondary | ICD-10-CM | POA: Diagnosis not present

## 2019-01-03 DIAGNOSIS — M0609 Rheumatoid arthritis without rheumatoid factor, multiple sites: Secondary | ICD-10-CM | POA: Diagnosis not present

## 2019-01-03 DIAGNOSIS — J849 Interstitial pulmonary disease, unspecified: Secondary | ICD-10-CM | POA: Diagnosis not present

## 2019-01-03 DIAGNOSIS — M2550 Pain in unspecified joint: Secondary | ICD-10-CM | POA: Diagnosis not present

## 2019-01-03 DIAGNOSIS — Z6835 Body mass index (BMI) 35.0-35.9, adult: Secondary | ICD-10-CM | POA: Diagnosis not present

## 2019-01-03 DIAGNOSIS — M5440 Lumbago with sciatica, unspecified side: Secondary | ICD-10-CM | POA: Diagnosis not present

## 2019-01-03 DIAGNOSIS — I2510 Atherosclerotic heart disease of native coronary artery without angina pectoris: Secondary | ICD-10-CM | POA: Diagnosis not present

## 2019-01-20 ENCOUNTER — Other Ambulatory Visit: Payer: Self-pay | Admitting: Cardiovascular Disease

## 2019-01-30 DIAGNOSIS — R05 Cough: Secondary | ICD-10-CM | POA: Diagnosis not present

## 2019-01-30 DIAGNOSIS — Z20828 Contact with and (suspected) exposure to other viral communicable diseases: Secondary | ICD-10-CM | POA: Diagnosis not present

## 2019-01-30 DIAGNOSIS — J329 Chronic sinusitis, unspecified: Secondary | ICD-10-CM | POA: Diagnosis not present

## 2019-02-01 DIAGNOSIS — R05 Cough: Secondary | ICD-10-CM | POA: Diagnosis not present

## 2019-02-01 DIAGNOSIS — Z20828 Contact with and (suspected) exposure to other viral communicable diseases: Secondary | ICD-10-CM | POA: Diagnosis not present

## 2019-02-05 ENCOUNTER — Telehealth: Payer: Self-pay | Admitting: Pulmonary Disease

## 2019-02-05 NOTE — Telephone Encounter (Signed)
Spoke with the pt  He was last seen in 2019  I advised needs appt for any CPAP supplies  Appt scheduled for 02/12/2019

## 2019-02-06 ENCOUNTER — Other Ambulatory Visit: Payer: Self-pay | Admitting: Gastroenterology

## 2019-02-06 DIAGNOSIS — Z6835 Body mass index (BMI) 35.0-35.9, adult: Secondary | ICD-10-CM | POA: Diagnosis not present

## 2019-02-06 DIAGNOSIS — R0602 Shortness of breath: Secondary | ICD-10-CM | POA: Diagnosis not present

## 2019-02-06 DIAGNOSIS — R5383 Other fatigue: Secondary | ICD-10-CM | POA: Diagnosis not present

## 2019-02-06 DIAGNOSIS — Z20828 Contact with and (suspected) exposure to other viral communicable diseases: Secondary | ICD-10-CM | POA: Diagnosis not present

## 2019-02-08 ENCOUNTER — Telehealth: Payer: Self-pay | Admitting: Primary Care

## 2019-02-08 NOTE — Telephone Encounter (Signed)
I called and spoke with the patient and made him aware that I will change the visit to a Televisit for Monday the 18th at 2pm. I advised him that the nurse will call first and then he will be transferred to the provider for the visit. He verbalized understanding. He refused a video visit due to not being familiar with it.

## 2019-02-12 ENCOUNTER — Encounter: Payer: Self-pay | Admitting: Primary Care

## 2019-02-12 ENCOUNTER — Other Ambulatory Visit: Payer: Self-pay

## 2019-02-12 ENCOUNTER — Encounter: Payer: PPO | Admitting: Primary Care

## 2019-02-12 ENCOUNTER — Ambulatory Visit (INDEPENDENT_AMBULATORY_CARE_PROVIDER_SITE_OTHER): Payer: PPO | Admitting: Primary Care

## 2019-02-12 DIAGNOSIS — J849 Interstitial pulmonary disease, unspecified: Secondary | ICD-10-CM | POA: Diagnosis not present

## 2019-02-12 DIAGNOSIS — G4733 Obstructive sleep apnea (adult) (pediatric): Secondary | ICD-10-CM

## 2019-02-12 NOTE — Progress Notes (Signed)
Virtual Visit via Telephone Note  I connected with Richard Simpson on 02/12/19 at  3:30 PM EST by telephone and verified that I am speaking with the correct person using two identifiers.  Location: Patient: Home Provider: Office   I discussed the limitations, risks, security and privacy concerns of performing an evaluation and management service by telephone and the availability of in person appointments. I also discussed with the patient that there may be a patient responsible charge related to this service. The patient expressed understanding and agreed to proceed.  History of Present Illness: 49 year old male, former smoker quit in 2005 (3.75 pack year hx). PMH significant for  OSA, ILD, rheumatoid arthritis, NSTEMI in 2005 s/p PTCA to LAD, diastolic heart failure, HTN, GERD, obesity. Patient of Dr. Elsworth Soho, last seen on 11/04/2017. Maintained on cellcept 1,000mg  twice daily and methylprednisolone 4mg  daily.    02/12/2019 Patient contacted today for regular follow-up/televisit for OSA. He is compliant with CPAP use, feels he does not always getting enough pressure at night. Needing supplies renewed with DME company. His wife and children tested positive for covid on 01/31/19. He tested negative three times but was assumed covid positive d/t symptoms. He is doing a lot better, still having some shortness of breath. He was following closely with Primary care, O2 readings at his lowest were 89-90% RA. Reports that his CXR showed congestive heart failure. No evidence of pneumonia (image not available for review). He takes lasix 20mg  daily as needed, saw PCP last week and was instructed to take 80mg  for a couple of days. Denies fever, sweats, chills, chest pain/tightness, N/V/D.   [facility] 11/14/18- 02/11/19; Usage 88/90 days; 86% > 4 hours Average hours used 6 hours 18mins Pressure 8cm h20 AHI 2.0   Observations/Objective:  Patient reports O2 93-94% RA  No observed shortness of breath/wheezing or  cough during phone conversation   Pulmonary testing:  PFTs 10/2017 FVC 83%, DLCO 98%  PFTs 12/2013 >>no obs,FVC 77%, nml DLCO -Mild restriction?effect of obesity  PFT12/2018moderate restriction .FEV1 70%, ratio 86, FVC 63%, DLCO 74%.  Imaging: HRCT 12/2016 >>worsening multifocal areas of ground-glass attenuation, most confluent in the left upper lobe, but with progressive involvement in the lower lobes of the lungs bilaterally as well.  5/31/2016CT angio chest >>Patchy regions of ground-glass attenuation opacity 25mm &6cm in the medial aspect of the left upper lobe with associated volume loss 09/2014 CT chest >Decrease in size of ground-glass attenuating opacity within the perihilar left upper lobe compatible with a benign, likely infectious or inflammatory process.  Sleep testing: Split NPSG12/2018-wt 275 pounds -  AHI 60/hour with lowest desaturation 75%. 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 QuantiFERON test in 2015 (while on remicaid), but repeat testing was normal  Assessment and Plan:  OSA: - 100% Compliant and reports benefit from CPAP - Current pressure 8cm h20; AHI 2.0  - Plan change pressure to 9cm h20 for comfort  - Monitor nocturnal O2 readings, if <88-90% recommend ONO on CPAP  - Advised not to drive if experiencing excessive daytime fatigue or somnolence  - Continue to work on weight loss and staying physically active  - Renew CPAP supplies   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 Elsworth Soho   I discussed the assessment and treatment plan with the patient. The patient was provided an opportunity to ask questions and all were answered.  The patient agreed with the plan and demonstrated an understanding of the instructions.   The patient was advised to call back or seek an in-person evaluation if the symptoms worsen or if the condition fails to improve as anticipated.  I provided 22 minutes of non-face-to-face time during this encounter.   Martyn Ehrich, NP

## 2019-02-12 NOTE — Addendum Note (Signed)
Addended by: Tery Sanfilippo R on: 02/12/2019 04:29 PM   Modules accepted: Orders

## 2019-02-12 NOTE — Patient Instructions (Addendum)
Recommendations: Continue to wear CPAP every night for 4-6 hours or more If notice O2 <88% at night notify office and we will check over night oximeter for possible nocturnal oxygen  Order: HRCT- 8 weeks fu ILD Increase pressure 9cm h20 Renew CPAP supplies  Following- up: 3-4 month Dr. Elsworth Soho

## 2019-02-13 DIAGNOSIS — G4733 Obstructive sleep apnea (adult) (pediatric): Secondary | ICD-10-CM | POA: Diagnosis not present

## 2019-02-16 ENCOUNTER — Telehealth: Payer: Self-pay | Admitting: Primary Care

## 2019-02-16 NOTE — Telephone Encounter (Signed)
I put CT order in my folder to schedule for May/June per message from BW.  Nothing further needed.

## 2019-02-16 NOTE — Telephone Encounter (Signed)
Spoke with Dr. Elsworth Soho he recommend pushing out HRCT to 3-4 months (May/June)

## 2019-02-21 NOTE — Telephone Encounter (Signed)
Spoke with pt and since covid has noted chest heaviness and SOB  Per pt feels this may be improving slowly day by day but wanted to make sure heart was good Virtual visit scheduled for Friday and 9:20 am .Adonis Housekeeper

## 2019-02-23 ENCOUNTER — Encounter: Payer: Self-pay | Admitting: Cardiovascular Disease

## 2019-02-23 ENCOUNTER — Telehealth (INDEPENDENT_AMBULATORY_CARE_PROVIDER_SITE_OTHER): Payer: PPO | Admitting: Cardiovascular Disease

## 2019-02-23 VITALS — BP 124/94 | HR 68 | Ht 73.0 in | Wt 260.0 lb

## 2019-02-23 DIAGNOSIS — I10 Essential (primary) hypertension: Secondary | ICD-10-CM | POA: Diagnosis not present

## 2019-02-23 DIAGNOSIS — I25119 Atherosclerotic heart disease of native coronary artery with unspecified angina pectoris: Secondary | ICD-10-CM

## 2019-02-23 DIAGNOSIS — E785 Hyperlipidemia, unspecified: Secondary | ICD-10-CM

## 2019-02-23 DIAGNOSIS — R0600 Dyspnea, unspecified: Secondary | ICD-10-CM

## 2019-02-23 DIAGNOSIS — G4733 Obstructive sleep apnea (adult) (pediatric): Secondary | ICD-10-CM

## 2019-02-23 DIAGNOSIS — J849 Interstitial pulmonary disease, unspecified: Secondary | ICD-10-CM | POA: Diagnosis not present

## 2019-02-23 DIAGNOSIS — I5032 Chronic diastolic (congestive) heart failure: Secondary | ICD-10-CM

## 2019-02-23 DIAGNOSIS — E7849 Other hyperlipidemia: Secondary | ICD-10-CM | POA: Diagnosis not present

## 2019-02-23 DIAGNOSIS — R5383 Other fatigue: Secondary | ICD-10-CM

## 2019-02-23 MED ORDER — FUROSEMIDE 40 MG PO TABS: 40.0000 mg | ORAL_TABLET | Freq: Every day | ORAL | 3 refills | Status: DC

## 2019-02-23 NOTE — Progress Notes (Signed)
Virtual Visit via Telephone Note   This visit type was conducted due to national recommendations for restrictions regarding the COVID-19 Pandemic (e.g. social distancing) in an effort to limit this patient's exposure and mitigate transmission in our community.  Due to his co-morbid illnesses, this patient is at least at moderate risk for complications without adequate follow up.  This format is felt to be most appropriate for this patient at this time.  The patient did not have access to video technology/had technical difficulties with video requiring transitioning to audio format only (telephone).  All issues noted in this document were discussed and addressed.  No physical exam could be performed with this format.  Please refer to the patient's chart for his  consent to telehealth for [facility].   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 He developed severe chest pain the evening of July 04, 2014 leading to his [facility] hospitalization on the morning of June 10.  He was seen in consultation by Dr. Harl Bowie and transferred for urgent cardiac catheterization which was done by me. This revealed a subtotal 99+ percent ostial LAD stenosis followed by proximal 70% and 50% stenoses.  Initial ejection fraction was 45% with anterolateral hypocontractility.  He underwent successful intervention with insertion of Synergy DES 3.520 mm stent placed from the LAD ostium to cover all 3 lesions.  Subsequently, he noticed marked benefit in his previous exertional shortness of  breath symptomatology.  He was seen  In follow-up by Murray Hodgkins, NP and due to and ACE-induced cough was started on losartan in place of lisinopril. He has a history of arthritic disease and in the past had been treated for probable psoriatic arthritis but he is now felt to have rheumatoid arthritis.  An echo Doppler study on 08/09/2014 which mild focal basal hypertrophy of the septum.  There are no wall motion abnormalities.  Ejection fraction was 60-65%.  There was grade 1 diastolic dysfunction.  The mitral valve was mildly calcified at its annulus and there was trivial aortic insufficiency.  He participated in cardiac rehabilitation last year.  He developed recurrent episodes of chest tightness and underwent repeat cardiac catheterization on 03/24/2015 by Dr. Ellyn Hack which showed a widely patent stent at the ostium and proximal LAD segment.  There was mild 35% narrowing after the takeoff of the first diagonal vessel.  His circumflex and RCA were considered to be normal.  His LVEDP was mildly increased.  It was felt that there was no new evidence for a new CAD lesion to explain his symptoms.  It was felt that possibly his symptoms were related to microvascular disease versus diastolic dysfunction.  Subsequently, he was started on Lasix 20 mg.  He also has been taking amlodipine 5 mg, losartan 25 mg, metoprolol tartrate 25 mg twice a day.  He continues to be on aspirin 81 mg and Brilinta 90 mg twice a day.  His wife was very concerned about this 35% LAD stenosis.   When I saw him in March 2017, I  titrated his losartan from 25 mg to 37.5 mg and recommended addition of Zetia to his 80 mg atorvastatin in attempt to induce further plaque regression.  He underwent an echo Doppler study on 04/28/2015 which showed an ejection fraction at 60-65%.  He did not have any regional wall motion abnormalities.  He had mild left atrial dilatation.  He was felt to have normal diastolic function.  On subsequent  evaluation he continued, he has continued to experience episodes of chest discomfort which he believes occurs with activity.  Due to potential microvascular component to symptomatology I added nitrate therapy.  I referred him for a cardiopulmonary met test which did not reveal significant cardiac or pulmonary limitation.   He was  seen by Rosaria Ferries, PAC, and because of increasing recurrent chest pain.  He underwent repeat cardiac catheterization by me 11/20/2015.  This did not demonstrate any residual CAD and had a widely patent LAD stent extending from the ostium ending prior to the takeoff of the first diagonal vessel.  There was mild 15% narrowing in the LAD after diagonal vessel which was improved from previous evaluation.  His circumflex, intermediate, and dominant RCA vessels were normal.   He was seen in June 2018 by Melvyn Neth and was seen by Almyra Deforest on 12/28/2016.  His isosorbide was increased to 30 mg and he has noticed improvement and has not taken subungual nitroglycerin.  He had developed shortness of breath with hunting.  She referred him for an echo Doppler study on 01/06/2017, which showed an EF 50-55% with anteroapical, septal hypokinesis and grade 1 diastolic dysfunction.     I had not seen him since February 2017 saw him on January 13, 2017.  He also has undergone pulmonary evaluation and was found to have restrictive physiology on PFTs.  He has been followed by Dr. Elsworth Soho and a high resolution CT scan showed worsening of multifocal areas of groundglass attenuation most confluent in the left upper lobe but with progressive involvement in the lower lobes of the lungs bilaterally as well.  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  He is now off Remicade and is on CellCept.  When I last saw him in the office in April 2019 he was doing well and was closely followed by pulmonology for  interstitial lung disease.  He also has rheumatoid arthritis.  He was doing well on Imdur, Norvasc, metoprolol and low-dose losartan. He continued to be on aspirin and Plavix. He notes significant improvement in his shortness of breath.  He has lost over 40 pounds.  He is breathing better.  He denies recurrent anginal symptoms.   He was later seen in October 2019 by Fabian Sharp, [place] and remained stable and his last cardiology evaluation was with Almyra Deforest, PA in November 2020. Due to the COVID-19, he had gained some weight  However he denied any significant cardiac issue.  He continued to have some chest discomfort with more strenuous activities such as hunting, however no chest discomfort with regular daily activity.   It was recommended that he continue his current cardiac medications.  Mr. Serano was remaining stable from a cardiac standpoint however around [place] of this year his whole family as well as his son's girlfriends family all came down with Covid.  He states he never tested positive but he had similar symptomatology to all his family members and actually had the  worst symptoms.  His oxygen decreased slightly to 90.  He was advised hospitalization but declined.  He was treated with steroids and Z-Pak after chest x-ray showed changes and he was found to have some interstitial edema.  He did not receive remdemsivir.  He received Lasix 80 mg for several days with improvement in his shortness of breath.  Prior to his most likely Covid episode, he states he was only taking Lasix as needed for some shortness of breath.  Since his symptoms he has required daily Lasix of at least 40 or 60 mg ever since.  He continues to experience residual fatigue.  He has obstructive sleep apnea and is continued to use CPAP therapy.  He will be undergoing a follow-up high resolution chest CT for his interstitial lung disease.  He denies any of his previous anginal symptoms.  He admits to weight gain and fatigability with  exercise.  He has been trying to walk on a treadmill but often after 5 minutes he typically has to stop.  He presents for evaluation.   Past Medical History:  Diagnosis Date  . Anxiety   . CAD S/P percutaneous coronary angioplasty 10/2013   a. 10/2013 Cath: mod LAD dzs->nl FFR-> medically managed;  b. 06/2014 NSTEMI/PCi: LAD 99ost, 70p, 77m(entire area covered with a 3.5x20 Synergy DES), LCX nl, RCA 269m10d, EF 45-50%. c. 10/2015: cath showing patent LAD stent with 15% Prox LAD stenosis. EF 55-65%.   . [place]IVE [place], THORACIC SPINE 09/16/2009   Qualifier: Diagnosis of  By: HaAline BrochureD, StDorothyann Peng  . Depression   . Dyslipidemia, goal LDL below 70   . Essential hypertension   . GERD (gastroesophageal reflux disease)   . Headache   . History of kidney stones   . Interstitial lung disease ([place]  . Ischemic cardiomyopathy - Resolved 06/2014   a. 06/2014 EF 45-50% by LV gram b. Echo July 2016: EF 60-65%. No RWMA. Gr 1 DD   . NSTEMI (non-ST elevated myocardial infarction) ([place]6/2016  . Obesity (BMI 30-39.9) 10/29/2013  . Primary snoring 10/29/2013  . Rheumatoid arthritis ([place]8/30/2015  . RLS (restless legs syndrome) 10/29/2013  . Sleep apnea    Past Surgical History:  Procedure Laterality Date  . BIOPSY  09/27/2018   Procedure: BIOPSY;  Surgeon: RoDaneil DolinMD;  [facility];  Service: Endoscopy;;  . CARDIAC CATHETERIZATION N/A 07/05/2014   Procedure: Left Heart Cath and Coronary Angiography;  Surgeon: ThTroy SineMD;  [facility];  Service: Cardiovascular;  NSTEMI --> Ostial LAd 99%, prox 80% & early mid 50% --> PCI  . CARDIAC CATHETERIZATION  07/05/2014   Procedure: Coronary Stent Intervention;  Surgeon: ThTroy SineMD;  [facility];  Service: Cardiovascular; Ostial-prox LAD tandem 99, 80 & 50% --> Synergy DES 3.5 x 20 (3.74)  . CARDIAC CATHETERIZATION N/A 03/24/2015   Procedure: Left Heart Cath and Coronary Angiography;   Surgeon: DaLeonie ManMD;  [facility];  Service: Cardiovascular;  Laterality: N/A;  . CARDIAC CATHETERIZATION N/A 11/20/2015   Procedure: Left Heart Cath and Coronary Angiography;  Surgeon: ThTroy SineMD;  [facility];  Service: Cardiovascular;  Laterality: N/A;  . CHOLECYSTECTOMY     10 yrs ago-jenkins  . COLONOSCOPY N/A 03/23/2013   Procedure: COLONOSCOPY;  Surgeon: RoDaneil DolinMD;  [facility];  Service: Endoscopy;  Laterality: N/A;  2:15-moved  to 1030 Staff notified pt  . COLONOSCOPY WITH PROPOFOL N/A 06/10/2016   Procedure: COLONOSCOPY WITH PROPOFOL;  Surgeon: Daneil Dolin, MD;  [facility];  Service: Endoscopy;  Laterality: N/A;  730   . ESOPHAGOGASTRODUODENOSCOPY (EGD) WITH PROPOFOL N/A 09/27/2018   Procedure: ESOPHAGOGASTRODUODENOSCOPY (EGD) WITH PROPOFOL;  Surgeon: Daneil Dolin, MD;  [facility];  Service: Endoscopy;  Laterality: N/A;  1:30pm  . FRACTIONAL FLOW RESERVE WIRE  11/23/2013   Procedure: FRACTIONAL FLOW RESERVE WIRE;  Surgeon: Jettie Booze, MD;  Location: [facility] CATH LAB;  Service: Cardiovascular;;  . GALLBLADDER SURGERY    . LEFT HEART CATHETERIZATION WITH CORONARY ANGIOGRAM N/A 11/23/2013   Procedure: LEFT HEART CATHETERIZATION WITH CORONARY ANGIOGRAM;  Surgeon: Jettie Booze, MD;  [facility];  Service: Cardiovascular;  Laterality: N/A;  . MASS EXCISION  09/09/2010   Procedure: EXCISION MASS;  Surgeon: Arther Abbott, MD;  Location: [facility] ORS;  Service: Orthopedics;  Laterality: Left;  Excision Mass Left Long Finger  . POLYPECTOMY  06/10/2016   Procedure: POLYPECTOMY;  Surgeon: Daneil Dolin, MD;  [facility];  Service: Endoscopy;;  colon  . THROAT SURGERY     nodules removed from throat as child  . TRANSTHORACIC ECHOCARDIOGRAM   July 2016:    EF 60-65%. No RWMA. Gr 1 DD     Current Meds  Medication Sig  . amLODipine (NORVASC) 2.5 MG tablet Take 1  tablet daily . OFFICE VISIT NEEDED  . amLODipine (NORVASC) 5 MG tablet TAKE 1/2 TABLET BY MOUTH ONCE A DAY.  Marland Kitchen aspirin EC 81 MG tablet Take 1 tablet (81 mg total) by mouth daily.  Marland Kitchen atorvastatin (LIPITOR) 80 MG tablet TAKE (1) TABLET BY MOUTH ONCE DAILY.  . clonazePAM (KLONOPIN) 1 MG tablet Take 1 mg by mouth See admin instructions. Take 1 tablet (mg) by mouth scheduled at bedtime, may take an additional dose if needed for anxiety  . clopidogrel (PLAVIX) 75 MG tablet TAKE 1 TABLET BY MOUTH ONCE A DAY. (Patient taking differently: Take 75 mg by mouth daily. )  . furosemide (LASIX) 20 MG tablet TAKE (1) TABLET BY MOUTH ONCE DAILY. (Patient taking differently: Take 40 mg by mouth daily as needed for fluid. )  . loratadine (CLARITIN) 10 MG tablet Take 10 mg by mouth daily.   Marland Kitchen losartan (COZAAR) 25 MG tablet Take 1 tablet (25 mg total) by mouth daily.  . methylPREDNISolone (MEDROL) 4 MG tablet Take 4 mg by mouth daily.   . metoprolol tartrate (LOPRESSOR) 25 MG tablet Take 1 tablet (25 mg total) by mouth 2 (two) times daily. OFFICE VISIT NEEDED  . mycophenolate (CELLCEPT) 500 MG tablet Take 1,000 mg by mouth 2 (two) times daily.   . nitroGLYCERIN (NITROSTAT) 0.4 MG SL tablet DISSOLVE 1 TABLET UNDER TONGUE FOR CHEST TIGHTNESS AND SHORTNESS OF BREATH. MAY TAKE EVERY 5 MINUTES UP TO 3 DOSES. IF NO RELIEF AFTER 3 DOS (Patient taking differently: Place 0.4 mg under the tongue every 5 (five) minutes x 3 doses as needed for chest pain. )  . pantoprazole (PROTONIX) 40 MG tablet TAKE 1 TABLET BY MOUTH ONCE A DAY.  . traMADol (ULTRAM) 50 MG tablet Take 50 mg by mouth every 6 (six) hours as needed for moderate pain.      Allergies:   Lisinopril and Prednisone   Social History   Tobacco Use  . Smoking status: Former Smoker    Packs/day: 0.25    Years: 15.00  Pack years: 3.75    Types: Cigarettes    Quit date: 09/04/2003    Years since quitting: 15.4  . Smokeless tobacco: Current User    Types: Chew    Substance Use Topics  . Alcohol use: Yes    Alcohol/week: 1.0 - 2.0 standard drinks    Types: 1 - 2 Cans of beer per week    Comment: rare  . Drug use: No     Family Hx: The patient's family history includes Arthritis in his paternal uncle; Cancer in his maternal grandfather; Diabetes in his maternal grandmother, mother, paternal grandfather, and other family members; Hypertension in his maternal grandmother and mother. There is no history of Anesthesia problems, Hypotension, Malignant hyperthermia, Pseudochol deficiency, Heart attack, or Stroke.  ROS:   Please see the history of present illness.    Positive for probable recent Covid infection Positive for residual fatigue Increasing shortness of breath Mild edema of his legs and hands No anginal symptoms No bleeding No palpitations OSA on CPAP No paresthesias All other systems reviewed and are negative.   Prior CV studies:   The following studies were reviewed today:  -------------------------------------------------------------------  ECHO Study Conclusions: 01/06/2017  - Left ventricle: The cavity size was normal. Wall thickness was  increased in a pattern of moderate LVH. There was focal basal  hypertrophy. Systolic function was normal. The estimated ejection  fraction was in the range of 50% to 55%. Severe hypokinesis of  the apicalanteroseptal myocardium. Doppler parameters are  consistent with abnormal left ventricular relaxation (grade 1  diastolic dysfunction).  - Aortic valve: There was trivial regurgitation.   Labs/Other Tests and Data Reviewed:    EKG:  An ECG dated 12/15/2018 was personally reviewed today and demonstrated:  Normal sinus rhythm at 66.  No ectopy.  Normal intervals  Recent Labs: 08/31/2018: ALT 25 09/26/2018: BUN 21; Creatinine, Ser 0.91; Potassium 3.3; Sodium 139   Recent Lipid Panel Lab Results  Component Value Date/Time   CHOL 103 04/16/2015 08:20 AM   CHOL 114 08/06/2014  07:46 AM   TRIG 70 04/16/2015 08:20 AM   HDL 28 (L) 04/16/2015 08:20 AM   HDL 28 (L) 08/06/2014 07:46 AM   CHOLHDL 3.7 04/16/2015 08:20 AM   LDLCALC 61 04/16/2015 08:20 AM   LDLCALC 63 08/06/2014 07:46 AM    Wt Readings from Last 3 Encounters:  02/23/19 260 lb (117.9 kg)  12/25/18 257 lb 3.2 oz (116.7 kg)  09/26/18 245 lb (111.1 kg)     Objective:    Vital Signs:  BP (!) 124/94   Pulse 68   Ht '6\' 1"'  (1.854 m)   Wt 260 lb (117.9 kg)   BMI 34.30 kg/m    Since this was a telemedicine evaluation I could not personally examine the patient.  However, compared to his last office with me, he has gained 18 pounds. Breathing was normal and not labored There was no wheezing There was no chest pain He denied abdominal discomfort He has had issues with feet and hand swelling Positive fatigability Shortness of breath with activity Continues to use CPAP with 100% compliance   ASSESSMENT & PLAN:    1. CAD/NSTEM: He suffered a non-STEMI on July 05, 2014 with peak troponin 18.2 secondary to 99% ostial LAD stenosis.  He underwent successful insertion of a 3.5 x 20 mm Synergy DES stent which covered 3 LAD lesions.  Repeat catheterization in February 2017 due to recurrent chest pain revealed widely patent stent with only mild  35% nonobstructive LAD stenosis.  At present, he is not having anginal cytology.  He does experience increasing shortness of breath and fatigability and significantly reduced exercise capacity since his recent Covid infection. 2. Exertional dyspnea: He has a history of diastolic dysfunction as well as interstitial lung disease which may be playing a role in his dyspnea.  However he recently had a Covid infection even though he reportedly did not positive.  I have recommended he undergo a follow-up 2D echo Doppler study to reassess both systolic and diastolic lesion.  He has required increased diuresis with Lasix since his infection and recently has been taking either 40 or 60  mg daily due to swelling.  I will change his Lasix to 40 mg daily instead of his 20 mg pill.  He continues to be on losartan and dose may need to be further titrated depending upon blood pressure and LV function. 3. Interstitial lung disease: Followed by Dr.Alva.  Prior's CT scan had shown worsening multifocal areas of groundglass attenuation in multiple lung zones.  He is to have a follow-up probable high resolution CT of his chest.  He has been on CellCept. 4. Probable recent Covid infection: Followed by Dr. Kern Alberta.  Treated with Zithromax and prednisone in addition to increased  Diuresis.  We will recheck echo Doppler study to make certain no subsequent  myocarditis or LV dysfunction has developed. 5. Essential hypertension: Documentation of grade 1 diastolic dysfunction.  Currently on losartan, Lasix, amlodipine and metoprolol. 6. Hyperlipidemia: Target LDL less than 70 in this patient with CAD.  Currently on atorvastatin. 7. Rheumatoid arthritis: Followed by Dr. Kern Alberta. 8. Obesity: Since I last saw him in April 2019 weight gain has been 18 pounds.  BMI is now 34.3.  Weight loss, proper diet, and increased activity was recommended with ideal exercise at least 5 days/week for 30 minutes if at all possible. 9. OSA: On CPAP with 100% compliance  COVID-19 Education: The signs and symptoms of COVID-19 were discussed with the patient and how to seek care for testing (follow up with PCP or arrange E-visit).  The importance of social distancing was discussed today.  Time:   Today, I have spent 25  minutes with the patient with telehealth technology discussing the above problems.     Medication Adjustments/Labs and Tests Ordered: Current medicines are reviewed at length with the patient today.  Concerns regarding medicines are outlined above.   Tests Ordered: No orders of the defined types were placed in this encounter.   Medication Changes: No orders of the defined types were placed  in this encounter.   Follow Up: In person in 6 to 8 weeks  Signed, [name], MD  02/23/2019 9:51 AM    [place]

## 2019-02-23 NOTE — Patient Instructions (Addendum)
Medication Instructions:   Increase furosemide to 40 mg daily.  *If you need a refill on your cardiac medications before your next appointment, please call your pharmacy*  Lab Work: Your physician recommends that you return for a FASTING lipid profile, CMET, CBC, and TSH at your earliest convenience. You do not need an appointment to have blood work done in our office. Our lab is open from 8 AM-4:30 PM.  --Nothing to eat or drink after midnight the night before.  If you have labs (blood work) drawn today and your tests are completely normal, you will receive your results only by: Marland Kitchen MyChart Message (if you have MyChart) OR . A paper copy in the mail If you have any lab test that is abnormal or we need to change your treatment, we will call you to review the results.  Testing/Procedures: Your physician has requested that you have an echocardiogram. Echocardiography is a painless test that uses sound waves to create images of your heart. It provides your doctor with information about the size and shape of your heart and how well your heart's chambers and valves are working. This procedure takes approximately one hour. There are no restrictions for this procedure.  --A scheduler from our office will call you to schedule this test.  Follow-Up: At [facility], you and your health needs are our priority.  As part of our continuing mission to provide you with exceptional heart care, we have created designated Provider Care Teams.  These Care Teams include your primary Cardiologist (physician) and Advanced Practice Providers (APPs -  Physician Assistants and Nurse Practitioners) who all work together to provide you with the care you need, when you need it.  Your next appointment:   6 week(s)  The format for your next appointment:   In Person  Provider:   [name], MD

## 2019-03-02 ENCOUNTER — Other Ambulatory Visit: Payer: Self-pay | Admitting: Cardiovascular Disease

## 2019-03-07 ENCOUNTER — Ambulatory Visit (HOSPITAL_COMMUNITY): Payer: PPO | Attending: Cardiovascular Disease

## 2019-03-07 ENCOUNTER — Other Ambulatory Visit: Payer: Self-pay

## 2019-03-07 ENCOUNTER — Telehealth: Payer: Self-pay | Admitting: Primary Care

## 2019-03-07 DIAGNOSIS — I25119 Atherosclerotic heart disease of native coronary artery with unspecified angina pectoris: Secondary | ICD-10-CM | POA: Diagnosis not present

## 2019-03-07 DIAGNOSIS — J849 Interstitial pulmonary disease, unspecified: Secondary | ICD-10-CM | POA: Diagnosis not present

## 2019-03-07 DIAGNOSIS — R5383 Other fatigue: Secondary | ICD-10-CM | POA: Diagnosis not present

## 2019-03-07 DIAGNOSIS — G4733 Obstructive sleep apnea (adult) (pediatric): Secondary | ICD-10-CM

## 2019-03-07 DIAGNOSIS — E785 Hyperlipidemia, unspecified: Secondary | ICD-10-CM | POA: Diagnosis not present

## 2019-03-07 DIAGNOSIS — R0600 Dyspnea, unspecified: Secondary | ICD-10-CM | POA: Diagnosis not present

## 2019-03-07 DIAGNOSIS — I5032 Chronic diastolic (congestive) heart failure: Secondary | ICD-10-CM | POA: Diagnosis not present

## 2019-03-07 DIAGNOSIS — I10 Essential (primary) hypertension: Secondary | ICD-10-CM

## 2019-03-07 LAB — COMPREHENSIVE METABOLIC PANEL
ALT: 37 IU/L (ref 0–44)
AST: 24 IU/L (ref 0–40)
Albumin/Globulin Ratio: 2 (ref 1.2–2.2)
Albumin: 4.3 g/dL (ref 4.0–5.0)
Alkaline Phosphatase: 67 IU/L (ref 39–117)
BUN/Creatinine Ratio: 23 — ABNORMAL HIGH (ref 9–20)
BUN: 19 mg/dL (ref 6–24)
Bilirubin Total: 0.5 mg/dL (ref 0.0–1.2)
CO2: 22 mmol/L (ref 20–29)
Calcium: 9 mg/dL (ref 8.7–10.2)
Chloride: 105 mmol/L (ref 96–106)
Creatinine, Ser: 0.81 mg/dL (ref 0.76–1.27)
GFR calc Af Amer: 122 mL/min/{1.73_m2} (ref >59)
GFR calc non Af Amer: 106 mL/min/{1.73_m2} (ref >59)
Globulin, Total: 2.1 g/dL (ref 1.5–4.5)
Glucose: 82 mg/dL (ref 65–99)
Potassium: 3.8 mmol/L (ref 3.5–5.2)
Sodium: 142 mmol/L (ref 134–144)
Total Protein: 6.4 g/dL (ref 6.0–8.5)

## 2019-03-07 LAB — CBC
Hematocrit: 42.5 % (ref 37.5–51.0)
Hemoglobin: 15.3 g/dL (ref 13.0–17.7)
MCH: 33.1 pg — ABNORMAL HIGH (ref 26.6–33.0)
MCHC: 36 g/dL — ABNORMAL HIGH (ref 31.5–35.7)
MCV: 92 fL (ref 79–97)
Platelets: 173 10*3/uL (ref 150–450)
RBC: 4.62 x10E6/uL (ref 4.14–5.80)
RDW: 12.6 % (ref 11.6–15.4)
WBC: 4.8 10*3/uL (ref 3.4–10.8)

## 2019-03-07 LAB — LIPID PANEL
Chol/HDL Ratio: 3.3 ratio (ref 0.0–5.0)
Cholesterol, Total: 129 mg/dL (ref 100–199)
HDL: 39 mg/dL — ABNORMAL LOW (ref >39)
LDL Chol Calc (NIH): 73 mg/dL (ref 0–99)
Triglycerides: 87 mg/dL (ref 0–149)
VLDL Cholesterol Cal: 17 mg/dL (ref 5–40)

## 2019-03-07 LAB — TSH: TSH: 1.89 u[IU]/mL (ref 0.450–4.500)

## 2019-03-08 NOTE — Telephone Encounter (Signed)
Tried to call pt & goes straight to message stating pt has vm that has not been set up yet.  Tried to call his wife's # - she is his contact.  Her phone does the same thing.

## 2019-03-08 NOTE — Telephone Encounter (Addendum)
This note was on CT order originally placed for March -     Phone message received from [facility] with Dr. Elsworth Soho he recommend pushing out HRCT to 3-4 months (May/June)  -Put in my folder for 05/2019  I just tried to call pt to make him aware Dr Elsworth Soho suggested holding off on CT but he has vm that has not been set up.  Will try to call him later.

## 2019-03-09 ENCOUNTER — Telehealth: Payer: Self-pay | Admitting: Cardiovascular Disease

## 2019-03-09 NOTE — Telephone Encounter (Signed)
Patient calling for echo results 

## 2019-03-09 NOTE — Telephone Encounter (Signed)
Spoke to pt & made him aware RA wants him to wait til May/June to have CT. Told him I would be scheduling in April for May.  He states ok.  Nothing further needed.

## 2019-03-12 NOTE — Telephone Encounter (Signed)
Results released through mychart 

## 2019-03-13 NOTE — Telephone Encounter (Signed)
Left message to call back  

## 2019-03-13 NOTE — Telephone Encounter (Signed)
Patient calling back to have a nurse explain his echo results.

## 2019-03-13 NOTE — Telephone Encounter (Signed)
Follow Up  Patient returning call. Please give patient a call back.  

## 2019-03-13 NOTE — Telephone Encounter (Signed)
Spoke to pt who report last week he's taken an additional 20 mg of lasix daily to total 60 mg. He report when he take 60 mg, he doesn't have the SOB and chest tightness. He state he will try to cut back to 40 mg this week but inquiring recommendations from MD.

## 2019-03-15 NOTE — Telephone Encounter (Signed)
If patient is feeling better taking 60 mg instead of 40 mg with improvement in shortness of breath or swelling okay to continue but probably should check bmet in a week to make certain renal function is stable

## 2019-03-16 ENCOUNTER — Telehealth: Payer: Self-pay

## 2019-03-16 DIAGNOSIS — Z79899 Other long term (current) drug therapy: Secondary | ICD-10-CM

## 2019-03-16 DIAGNOSIS — I5032 Chronic diastolic (congestive) heart failure: Secondary | ICD-10-CM

## 2019-03-16 NOTE — Telephone Encounter (Signed)
Order placed for BMET. Will mail lab slip to pt.

## 2019-03-16 NOTE — Telephone Encounter (Signed)
Spoke with pt regarding Dr.Kelly's advice on lasix. Per dr. Claiborne Billings " If patient is feeling better taking 60mg  instead of 40 mg with improvement in shortness of breath or swelling okay to continue but probably should check bmet in a week to make certain renal function is stable."  Pt stated he had been taking the extra 20mg  of lasix since he had his echo and labs drawn. Notified pt that the renal function lab is called a BMET and that this was not drawn with his other labs so he would still need to have this drawn.  Reviewed Echo results with pt as well per Dr.Kelly "Hyperdynamic LV function with EF 65 to 70%.  Grade 1 diastolic dysfunction"   Meaning that Your ECHO showed that your heart function is above normal- this means the heart is pumping more forceful, good news is that you are already on medication (Metoprolol) to help this not cause more issues. The ECHO also showed that at times your heart may not relax the way it should causing it to be stiff. No changes to anything at this time, continue to come to follow up appointments.  Pt's concern now- is it normal to have to increase his Lasix to help him breathe if his Echo results are okay.   Pt states that since he has had covid he can tell he is retaining more of the fluid and that the Lasix helps with the heaviness in his chest. Pt wants to know why he is retaining this fluid. Pt states he also has follow up with his lung doctor in march. I reminded him he also has a follow up appt with Dr.Kelly at the end of March. Told pt to discuss concerns with his lung doctor and that I would route message to [facility] for review as well. Pt states it is not urgent. No other questions or concerns at this time. Will place order for BMET and mail lab slip to pt.

## 2019-03-20 ENCOUNTER — Other Ambulatory Visit: Payer: Self-pay | Admitting: Cardiovascular Disease

## 2019-03-21 DIAGNOSIS — Z79899 Other long term (current) drug therapy: Secondary | ICD-10-CM | POA: Diagnosis not present

## 2019-03-21 DIAGNOSIS — I5032 Chronic diastolic (congestive) heart failure: Secondary | ICD-10-CM | POA: Diagnosis not present

## 2019-03-21 LAB — BASIC METABOLIC PANEL
BUN/Creatinine Ratio: 17 (ref 9–20)
BUN: 17 mg/dL (ref 6–24)
CO2: 23 mmol/L (ref 20–29)
Calcium: 9.2 mg/dL (ref 8.7–10.2)
Chloride: 101 mmol/L (ref 96–106)
Creatinine, Ser: 1 mg/dL (ref 0.76–1.27)
GFR calc Af Amer: 103 mL/min/{1.73_m2} (ref >59)
GFR calc non Af Amer: 89 mL/min/{1.73_m2} (ref >59)
Glucose: 86 mg/dL (ref 65–99)
Potassium: 4 mmol/L (ref 3.5–5.2)
Sodium: 138 mmol/L (ref 134–144)

## 2019-03-26 ENCOUNTER — Telehealth: Payer: Self-pay | Admitting: Cardiovascular Disease

## 2019-03-26 NOTE — Telephone Encounter (Signed)
Called and spoke to pt - stated that he is retaining fluid and taking up to 60mg  Lasix daily. Stated that he's been this way since he was diagnosed with Covid. He states that he swells after he eats. Asked pt if he was weighing himself daily, denied. Educated importance of daily weights. He stated that he gets chest pressure that will radiate up his neck and he can feel his pulse. Asked pt how long that usually last - stated about 45 mins. Asked pt if he takes Nitro when he has this pain, denied and stated that he does not feel this is the same type of pain he got while having his heart attack. He stated that he was coughing a lot as well. Advised pt that he needs to be seen. Earliest appt was 3/8 with Coletta Memos. Advised pt to weigh himself daily, take daily BP's, and to try taking Nitro next time he has chest pain. Advised pt that if he takes 2 Nitro then call 911. Verbalized understanding.

## 2019-03-26 NOTE — Telephone Encounter (Signed)
New Message     Pts wife is calling to follow up on Mychart message.  Pt feels a heaviness on his chest. He is coughing more and is taking a large amount of Lasix.  Pts wife is concerned and says he is not getting any better  She is wondering if it is from him having covid or if he is in heart failure    Please call back

## 2019-03-28 ENCOUNTER — Telehealth: Payer: Self-pay | Admitting: Cardiovascular Disease

## 2019-03-28 NOTE — Telephone Encounter (Signed)
Patient called in requesting his wife to come with him to his appointment. I stated that we ask only the patient come to the appointment unless it is for a medical reason. He understood but states that his wife has some concerns she would like to discuss with the doctor as well and that she hears and understands what the doctor is saying better than he can at times. Please advise.

## 2019-03-28 NOTE — Telephone Encounter (Signed)
Called wife back, informed her that Denyse Amass will call her when he is in the room with the pt for her to interact and participate with his OV

## 2019-03-29 ENCOUNTER — Other Ambulatory Visit: Payer: Self-pay | Admitting: Cardiovascular Disease

## 2019-04-01 ENCOUNTER — Emergency Department (HOSPITAL_COMMUNITY)
Admission: EM | Admit: 2019-04-01 | Discharge: 2019-04-02 | Disposition: A | Discharge status: Home / Self Care | Payer: PPO | Attending: Emergency Medicine | Admitting: Emergency Medicine

## 2019-04-01 DIAGNOSIS — R079 Chest pain, unspecified: Secondary | ICD-10-CM | POA: Diagnosis not present

## 2019-04-01 DIAGNOSIS — Z7982 Long term (current) use of aspirin: Secondary | ICD-10-CM | POA: Diagnosis not present

## 2019-04-01 DIAGNOSIS — I2510 Atherosclerotic heart disease of native coronary artery without angina pectoris: Secondary | ICD-10-CM | POA: Diagnosis not present

## 2019-04-01 DIAGNOSIS — Z79899 Other long term (current) drug therapy: Secondary | ICD-10-CM | POA: Diagnosis not present

## 2019-04-01 DIAGNOSIS — Z87891 Personal history of nicotine dependence: Secondary | ICD-10-CM | POA: Insufficient documentation

## 2019-04-01 DIAGNOSIS — R0789 Other chest pain: Secondary | ICD-10-CM

## 2019-04-01 DIAGNOSIS — Z20822 Contact with and (suspected) exposure to covid-19: Secondary | ICD-10-CM | POA: Diagnosis not present

## 2019-04-01 DIAGNOSIS — R0602 Shortness of breath: Secondary | ICD-10-CM | POA: Diagnosis not present

## 2019-04-01 DIAGNOSIS — I5032 Chronic diastolic (congestive) heart failure: Secondary | ICD-10-CM | POA: Insufficient documentation

## 2019-04-01 DIAGNOSIS — I110 Hypertensive heart disease with heart failure: Secondary | ICD-10-CM | POA: Diagnosis not present

## 2019-04-01 NOTE — Progress Notes (Signed)
Cardiology Office Note*-   Date:  04/01/2019   ID:  Richard Simpson, DOB March 23, 1970, MRN [id number]  PCP:  Caryl Bis, MD  Cardiologist:  [facility]  No chief complaint on file.    History of Present Illness: Richard Simpson is a 49 y.o. male who presents for ongoing assessment and management of CAD with hx of NSTEMI in 2016, with LAD  99% ostial stenosis. He had placement of of Synergy DES which covered 3 LAD lesions. Repeat catheterization in February 2017 due to recurrent chest pain revealed widely patent stent with only mild 35% nonobstructive LAD stenosis.  At present, he is not having anginal cytology.  He does experience increasing shortness of breath and fatigability and significantly reduced exercise capacity since his recent COVID infection.  He also has a history of diastolic dysfunction and interstitial lung disease likely related to prior COVID infection, Obesity, RA, HL and OSA on CPAP. His lasix was changed to 40 mg daily, and was to continue losartan. Echocardiogram was ordered to check for changes in LV fx. No valvular abnormalities.   Echo completed on 03/07/2019 revealed hyperdynamic LV fx, mild LVH, with Grade I diastolic dysfunction.  He was in th ED of /07/2019 with recurrent chest pain and dyspnea. He was ruled out for ACS. Troponin was 6. He has been taking extra doses of Lasix due to fluid retention and also NTG for chest pain. He states he has had chest pain every day with minimal exertion. Last night he had chest pain at rest, prompting ED visit. He has become concerned that these symptoms are more cardiac in etiology as he had the same symptoms with prior MI, that was "missed" until they called him back with elevated troponin.    Past Medical History:  Diagnosis Date   Anxiety    CAD S/P percutaneous coronary angioplasty 10/2013   a. 10/2013 Cath: mod LAD dzs->nl FFR-> medically managed;  b. 06/2014 NSTEMI/PCi: LAD 99ost, 70p, 1m (entire area covered with a 3.5x20  Synergy DES), LCX nl, RCA 67m, 10d, EF 45-50%. c. 10/2015: cath showing patent LAD stent with 15% Prox LAD stenosis. EF 55-65%.    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 06/2014 EF 45-50% by LV gram b. Echo July 2016: EF 60-65%. No RWMA. Gr 1 DD    NSTEMI (non-ST elevated myocardial infarction) ([place]) 06/2014   Obesity (BMI 30-39.9) 10/29/2013   Primary snoring 10/29/2013   Rheumatoid arthritis ([place]) 09/23/2013   RLS (restless legs syndrome) 10/29/2013   Sleep apnea     Past Surgical History:  Procedure Laterality Date   BIOPSY  09/27/2018   Procedure: BIOPSY;  Surgeon: Daneil Dolin, MD;  [facility];  Service: Endoscopy;;   CARDIAC CATHETERIZATION N/A 07/05/2014   Procedure: Left Heart Cath and Coronary Angiography;  Surgeon: Troy Sine, MD;  Location: [place] [facility];  Service: Cardiovascular;  NSTEMI --> Ostial LAd 99%, prox 80% & early mid 50% --> PCI   CARDIAC CATHETERIZATION  07/05/2014   Procedure: Coronary Stent Intervention;  Surgeon: Troy Sine, MD;  Location: [place] [facility];  Service: Cardiovascular; Ostial-prox LAD tandem 99, 80 & 50% --> Synergy DES 3.5 x 20 (3.74)   CARDIAC CATHETERIZATION N/A 03/24/2015   Procedure: Left Heart Cath and Coronary Angiography;  Surgeon: Leonie Man, MD;  Location: [place] [facility] LAB;  Service: Cardiovascular;  Laterality: N/A;   CARDIAC CATHETERIZATION N/A 11/20/2015   Procedure: Left Heart Cath and Coronary Angiography;  Surgeon: Troy Sine, MD;  Location: [place] CV LAB;  Service: Cardiovascular;  Laterality: N/A;   CHOLECYSTECTOMY     10 yrs ago-[facility]   COLONOSCOPY N/A 03/23/2013   Procedure:  COLONOSCOPY;  Surgeon: Daneil Dolin, MD;  [facility];  Service: Endoscopy;  Laterality: N/A;  2:15-moved to 1030 Staff notified pt   COLONOSCOPY WITH PROPOFOL N/A 06/10/2016   Procedure: COLONOSCOPY WITH PROPOFOL;  Surgeon: Daneil Dolin, MD;  [facility];  Service: Endoscopy;  Laterality: N/A;  730    ESOPHAGOGASTRODUODENOSCOPY (EGD) WITH PROPOFOL N/A 09/27/2018   Procedure: ESOPHAGOGASTRODUODENOSCOPY (EGD) WITH PROPOFOL;  Surgeon: Daneil Dolin, MD;  [facility];  Service: Endoscopy;  Laterality: N/A;  1:30pm   FRACTIONAL FLOW RESERVE WIRE  11/23/2013   Procedure: FRACTIONAL FLOW RESERVE WIRE;  Surgeon: Jettie Booze, MD;  Location: [facility] C[facility];  Service: Cardiovascular;;   GALLBLADDER SURGERY     LEFT HEART CATHETERIZATION WITH CORONARY ANGIOGRAM N/A 11/23/2013   Procedure: LEFT HEART CATHETERIZATION WITH CORONARY ANGIOGRAM;  Surgeon: Jettie Booze, MD;  Location: [facility] CATH LAB;  Service: Cardiovascular;  Laterality: N/A;   MASS EXCISION  09/09/2010   Procedure: EXCISION MASS;  Surgeon: Arther Abbott, MD;  Location: [facility];  Service: Orthopedics;  Laterality: Left;  Excision Mass Left Long Finger   POLYPECTOMY  06/10/2016   Procedure: POLYPECTOMY;  Surgeon: Daneil Dolin, MD;  [facility];  Service: Endoscopy;;  colon   THROAT SURGERY     nodules removed from throat as child   TRANSTHORACIC ECHOCARDIOGRAM   July 2016:    EF 60-65%. No RWMA. Gr 1 DD     Current Outpatient Medications  Medication Sig Dispense Refill   amLODipine (NORVASC) 2.5 MG tablet Take 1 tablet daily . OFFICE VISIT NEEDED 30 tablet 2   amLODipine (NORVASC) 5 MG tablet TAKE 1/2 TABLET BY MOUTH ONCE A DAY. 45 tablet 3   aspirin EC 81 MG tablet Take 1 tablet (81 mg total) by mouth daily. 30 tablet 3   atorvastatin (LIPITOR) 80 MG tablet TAKE (1) TABLET BY MOUTH ONCE DAILY. 90 tablet 0   clonazePAM (KLONOPIN) 1 MG tablet Take 1 mg by mouth See admin  instructions. Take 1 tablet (mg) by mouth scheduled at bedtime, may take an additional dose if needed for anxiety     clopidogrel (PLAVIX) 75 MG tablet Take 1 tablet (75 mg total) by mouth daily. 90 tablet 3   furosemide (LASIX) 40 MG tablet Take 1 tablet (40 mg total) by mouth daily. 30 tablet 3   loratadine (CLARITIN) 10 MG tablet Take 10 mg by mouth daily.      losartan (COZAAR) 25 MG tablet Take 1 tablet (25 mg total) by mouth every morning. **Needs OV for future refills** 30 tablet 1   methylPREDNISolone (MEDROL) 4 MG tablet Take 4 mg by mouth daily.      metoprolol tartrate (LOPRESSOR) 25 MG tablet TAKE (1) TABLET BY MOUTH TWICE DAILY. 60 tablet 7   mycophenolate (CELLCEPT) 500 MG tablet Take 1,000 mg by mouth 2 (two)  times daily.      nitroGLYCERIN (NITROSTAT) 0.4 MG SL tablet DISSOLVE 1 TABLET UNDER TONGUE FOR CHEST TIGHTNESS AND SHORTNESS OF BREATH. MAY TAKE EVERY 5 MINUTES UP TO 3 DOSES. IF NO RELIEF AFTER 3 DOS (Patient taking differently: Place 0.4 mg under the tongue every 5 (five) minutes x 3 doses as needed for chest pain. ) 25 tablet 0   pantoprazole (PROTONIX) 40 MG tablet TAKE 1 TABLET BY MOUTH ONCE A DAY. 30 tablet 5   traMADol (ULTRAM) 50 MG tablet Take 50 mg by mouth every 6 (six) hours as needed for moderate pain.      No current facility-administered medications for this visit.    Allergies:   Lisinopril and Prednisone    Social History:  The patient  reports that he quit smoking about 15 years ago. His smoking use included cigarettes. He has a 3.75 pack-year smoking history. His smokeless tobacco use includes chew. He reports current alcohol use of about 1.0 - 2.0 standard drinks of alcohol per week. He reports that he does not use drugs.   Family History:  The patient's family history includes Arthritis in his paternal uncle; Cancer in his maternal grandfather; Diabetes in his maternal grandmother, mother, paternal grandfather, and other family members; Hypertension in  his maternal grandmother and mother.    ROS: All other systems are reviewed and negative. Unless otherwise mentioned in H&P    PHYSICAL EXAM: VS:  There were no vitals taken for this visit. , BMI There is no height or weight on file to calculate BMI. GEN: Well nourished, well developed, in no acute distress HEENT: normal Neck: no JVD, carotid bruits, or masses Cardiac: RRR; 1/6 systolic murmurs, rubs, or gallops,no edema  Respiratory:  Clear to auscultation bilaterally, normal work of breathing GI: soft, nontender, nondistended, + BS MS: no deformity or atrophy Skin: warm and dry, no rash Neuro:  Strength and sensation are intact Psych: euthymic mood, full affect   EKG:  NSR, rate of 65 bpm. Motion artifact is prominent.   Recent Labs: 31-Mar-2019: ALT 37; Hemoglobin 15.3; Platelets 173; TSH 1.890 03/21/2019: BUN 17; Creatinine, Ser 1.00; Potassium 4.0; Sodium 138    Lipid Panel    Component Value Date/Time   CHOL 129 03-31-2019 0901   TRIG 87 03/31/2019 0901   HDL 39 (L) 2019/03/31 0901   CHOLHDL 3.3 03/31/19 0901   CHOLHDL 3.7 04/16/2015 0820   VLDL 14 04/16/2015 0820   LDLCALC 73 03-31-2019 0901      Wt Readings from Last 3 Encounters:  02/23/19 260 lb (117.9 kg)  12/25/18 257 lb 3.2 oz (116.7 kg)  09/26/18 245 lb (111.1 kg)      Other studies Reviewed: Echocardiogram Mar 31, 2019  1. Left ventricular ejection fraction, by estimation, is 65 to 70%. The  left ventricle has normal function. The left ventrical is not well  visualized to evaluate regional wall motion. There is mildly increased  left ventricular hypertrophy. Left  ventricular diastolic parameters are consistent with Grade I diastolic  dysfunction (impaired relaxation.)   2. Right ventricular systolic function is normal. The right ventricular  size is normal. There is normal pulmonary artery systolic pressure.   3. The mitral valve is abnormal. trivial mitral valve regurgitation. No  evidence of  mitral stenosis.   4. The aortic valve is tricuspid. Aortic valve regurgitation is trivial .  No aortic stenosis is present.   5. The inferior vena cava is normal in size with greater than 50%  respiratory variability, suggesting right atrial pressure of 3 mmHg.    LHC 11/20/2015 Ost LAD lesion, 0 %stenosed. Prox LAD to Mid LAD lesion, 15 %stenosed. The left ventricular ejection fraction is 55-65% by visual estimate. LV end diastolic pressure is mildly elevated.   No residual significant CAD with a widely patent LAD stent extending from the ostium extending proximally and ending prior to the takeoff of the first diagonal vessel.  There is mild 15% narrowing in the LAD after the diagonal vessel, which is actually improved from previously. The left circumflex, ramus intermediate  and the dominant RCA vessels are normal.   Normal LV function with an ejection fraction at approximately 60-65% without focal segmental wall motion abnormalities.  LHC 2016  Mid RCA lesion, 20% stenosed. Dist RCA lesion, 10% stenosed. Ost LAD lesion, 99% stenosed. There is a 0% residual stenosis post intervention. A drug-eluting stent was placed. Ost LAD to Prox LAD lesion, 70% stenosed. There is a 0% residual stenosis post intervention. A drug-eluting stent was placed. Mid LAD lesion, 50% stenosed. There is a 0% residual stenosis post intervention. A drug-eluting stent was placed.   Mild LV dysfunction with anterolateral hypocontractility and an ejection fraction approximately 45%.   Predominant single-vessel CAD with 99% ostial stenosis secondary to an ulcerated plaque followed by 70% and 50% stenosis after the septal perforating artery; normal left circumflex coronary artery; nonobstructive mild 20 and 10% stenoses in the mid RCA.  ASSESSMENT AND PLAN:  1. CAD: Hx of DES to the LAD x 3 in 2016, follow up cath in 2017 revealed patent stents. He has been having worsienng DOE, chest pressure and chest pain,  with need to take NTG several times over the last 4 days. His stamina is reduced.  I think we may need to study him again, and include RHC as well as LHC to evaluate further, in the setting of interstitial lung disease.   I have spoken with Dr. [place], [place] in the [place] office today for his opinion on this. He agreed that he would benefit from diagnostic cath. This will be scheduled for Friday, April 06, 2019 with Dr. Martinique at 7:30 am.    In the interim I will begin isosorbide mononitrate 15 mg daily so that he can have some help in his nitrate responsive chest pain.  Continue DAPT with ASA and Plavix  The patient understands that risks include but are not limited to stroke (1 in 1000), death (1 in 1000), kidney failure [usually temporary] (1 in 500), bleeding (1 in 200), allergic reaction [possibly serious] (1 in 200), and agrees to proceed.    2. Hx of Interstitial lung disease: Due to see pulmonologist today after this office visit.   3. Hypertension:BP is well controlled today No changes in his regimen at this time.Continue losartan, amlodipine and metoprolol. Consider changing to bisoprolol if he is having bronchospasms.   4. Hyperlipidemia:  Continue rosuvastatin.  Goal of LDL < 70.   Current medicines are reviewed at length with the patient today.  I have spent 40 minutes  dedicated to the care of this patient on the date of this encounter to include pre-visit review of records, assessment, management and diagnostic testing,with shared decision making.  Labs/ tests ordered today include: COVID screen and LHC/RHC.   Phill Myron. [place] Pugh, ANP, AACC   04/01/2019 12:06 PM    [facility] [place] [address] Office [phone] Fax [phone]  Notice: This dictation was prepared with  Dragon dictation along with smaller Company secretary. Any transcriptional errors that result from this process are unintentional and may not be corrected upon review.

## 2019-04-01 NOTE — H&P (View-Only) (Signed)
Cardiology Office Note*-   Date:  04/01/2019   ID:  Richard Simpson, DOB 06-13-1970, MRN [id number]  PCP:  Caryl Bis, MD  Cardiologist:  [facility]  No chief complaint on file.    History of Present Illness: Richard Simpson is a 49 y.o. male who presents for ongoing assessment and management of CAD with hx of NSTEMI in 2016, with LAD  99% ostial stenosis. He had placement of of Synergy DES which covered 3 LAD lesions. Repeat catheterization in February 2017 due to recurrent chest pain revealed widely patent stent with only mild 35% nonobstructive LAD stenosis.  At present, he is not having anginal cytology.  He does experience increasing shortness of breath and fatigability and significantly reduced exercise capacity since his recent COVID infection.  He also has a history of diastolic dysfunction and interstitial lung disease likely related to prior COVID infection, Obesity, RA, HL and OSA on CPAP. His lasix was changed to 40 mg daily, and was to continue losartan. Echocardiogram was ordered to check for changes in LV fx. No valvular abnormalities.   Echo completed on 03/07/2019 revealed hyperdynamic LV fx, mild LVH, with Grade I diastolic dysfunction.  He was in th ED of /07/2019 with recurrent chest pain and dyspnea. He was ruled out for ACS. Troponin was 6. He has been taking extra doses of Lasix due to fluid retention and also NTG for chest pain. He states he has had chest pain every day with minimal exertion. Last night he had chest pain at rest, prompting ED visit. He has become concerned that these symptoms are more cardiac in etiology as he had the same symptoms with prior MI, that was "missed" until they called him back with elevated troponin.    Past Medical History:  Diagnosis Date   Anxiety    CAD S/P percutaneous coronary angioplasty 10/2013   a. 10/2013 Cath: mod LAD dzs->nl FFR-> medically managed;  b. 06/2014 NSTEMI/PCi: LAD 99ost, 70p, 20m (entire area covered with a 3.5x20  Synergy DES), LCX nl, RCA 43m, 10d, EF 45-50%. c. 10/2015: cath showing patent LAD stent with 15% Prox LAD stenosis. EF 55-65%.    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 06/2014 EF 45-50% by LV gram b. Echo July 2016: EF 60-65%. No RWMA. Gr 1 DD    NSTEMI (non-ST elevated myocardial infarction) ([place]) 06/2014   Obesity (BMI 30-39.9) 10/29/2013   Primary snoring 10/29/2013   Rheumatoid arthritis ([place]) 09/23/2013   RLS (restless legs syndrome) 10/29/2013   Sleep apnea     Past Surgical History:  Procedure Laterality Date   BIOPSY  09/27/2018   Procedure: BIOPSY;  Surgeon: Daneil Dolin, MD;  [facility];  Service: Endoscopy;;   CARDIAC CATHETERIZATION N/A 07/05/2014   Procedure: Left Heart Cath and Coronary Angiography;  Surgeon: Troy Sine, MD;  Location: [place] [facility];  Service: Cardiovascular;  NSTEMI --> Ostial LAd 99%, prox 80% & early mid 50% --> PCI   CARDIAC CATHETERIZATION  07/05/2014   Procedure: Coronary Stent Intervention;  Surgeon: Troy Sine, MD;  Location: [place] [facility];  Service: Cardiovascular; Ostial-prox LAD tandem 99, 80 & 50% --> Synergy DES 3.5 x 20 (3.74)   CARDIAC CATHETERIZATION N/A 03/24/2015   Procedure: Left Heart Cath and Coronary Angiography;  Surgeon: Leonie Man, MD;  Location: [place] [facility];  Service: Cardiovascular;  Laterality: N/A;   CARDIAC CATHETERIZATION N/A 11/20/2015   Procedure: Left Heart Cath and Coronary Angiography;  Surgeon: Troy Sine, MD;  Location: [place] [facility];  Service: Cardiovascular;  Laterality: N/A;   CHOLECYSTECTOMY     10 yrs ago-jenkins   COLONOSCOPY N/A 03/23/2013   Procedure:  COLONOSCOPY;  Surgeon: Daneil Dolin, MD;  [facility];  Service: Endoscopy;  Laterality: N/A;  2:15-moved to 1030 Staff notified pt   COLONOSCOPY WITH PROPOFOL N/A 06/10/2016   Procedure: COLONOSCOPY WITH PROPOFOL;  Surgeon: Daneil Dolin, MD;  [facility];  Service: Endoscopy;  Laterality: N/A;  730    ESOPHAGOGASTRODUODENOSCOPY (EGD) WITH PROPOFOL N/A 09/27/2018   Procedure: ESOPHAGOGASTRODUODENOSCOPY (EGD) WITH PROPOFOL;  Surgeon: Daneil Dolin, MD;  [facility];  Service: Endoscopy;  Laterality: N/A;  1:30pm   FRACTIONAL FLOW RESERVE WIRE  11/23/2013   Procedure: FRACTIONAL FLOW RESERVE WIRE;  Surgeon: Jettie Booze, MD;  [facility];  Service: Cardiovascular;;   GALLBLADDER SURGERY     LEFT HEART CATHETERIZATION WITH CORONARY ANGIOGRAM N/A 11/23/2013   Procedure: LEFT HEART CATHETERIZATION WITH CORONARY ANGIOGRAM;  Surgeon: Jettie Booze, MD;  Location: [facility] CATH LAB;  Service: Cardiovascular;  Laterality: N/A;   MASS EXCISION  09/09/2010   Procedure: EXCISION MASS;  Surgeon: Arther Abbott, MD;  Location: [facility];  Service: Orthopedics;  Laterality: Left;  Excision Mass Left Long Finger   POLYPECTOMY  06/10/2016   Procedure: POLYPECTOMY;  Surgeon: Daneil Dolin, MD;  [facility];  Service: Endoscopy;;  colon   THROAT SURGERY     nodules removed from throat as child   TRANSTHORACIC ECHOCARDIOGRAM   July 2016:    EF 60-65%. No RWMA. Gr 1 DD     Current Outpatient Medications  Medication Sig Dispense Refill   amLODipine (NORVASC) 2.5 MG tablet Take 1 tablet daily . OFFICE VISIT NEEDED 30 tablet 2   amLODipine (NORVASC) 5 MG tablet TAKE 1/2 TABLET BY MOUTH ONCE A DAY. 45 tablet 3   aspirin EC 81 MG tablet Take 1 tablet (81 mg total) by mouth daily. 30 tablet 3   atorvastatin (LIPITOR) 80 MG tablet TAKE (1) TABLET BY MOUTH ONCE DAILY. 90 tablet 0   clonazePAM (KLONOPIN) 1 MG tablet Take 1 mg by mouth See admin  instructions. Take 1 tablet (mg) by mouth scheduled at bedtime, may take an additional dose if needed for anxiety     clopidogrel (PLAVIX) 75 MG tablet Take 1 tablet (75 mg total) by mouth daily. 90 tablet 3   furosemide (LASIX) 40 MG tablet Take 1 tablet (40 mg total) by mouth daily. 30 tablet 3   loratadine (CLARITIN) 10 MG tablet Take 10 mg by mouth daily.      losartan (COZAAR) 25 MG tablet Take 1 tablet (25 mg total) by mouth every morning. **Needs OV for future refills** 30 tablet 1   methylPREDNISolone (MEDROL) 4 MG tablet Take 4 mg by mouth daily.      metoprolol tartrate (LOPRESSOR) 25 MG tablet TAKE (1) TABLET BY MOUTH TWICE DAILY. 60 tablet 7   mycophenolate (CELLCEPT) 500 MG tablet Take 1,000 mg by mouth 2 (two)  times daily.      nitroGLYCERIN (NITROSTAT) 0.4 MG SL tablet DISSOLVE 1 TABLET UNDER TONGUE FOR CHEST TIGHTNESS AND SHORTNESS OF BREATH. MAY TAKE EVERY 5 MINUTES UP TO 3 DOSES. IF NO RELIEF AFTER 3 DOS (Patient taking differently: Place 0.4 mg under the tongue every 5 (five) minutes x 3 doses as needed for chest pain. ) 25 tablet 0   pantoprazole (PROTONIX) 40 MG tablet TAKE 1 TABLET BY MOUTH ONCE A DAY. 30 tablet 5   traMADol (ULTRAM) 50 MG tablet Take 50 mg by mouth every 6 (six) hours as needed for moderate pain.      No current facility-administered medications for this visit.    Allergies:   Lisinopril and Prednisone    Social History:  The patient  reports that he quit smoking about 15 years ago. His smoking use included cigarettes. He has a 3.75 pack-year smoking history. His smokeless tobacco use includes chew. He reports current alcohol use of about 1.0 - 2.0 standard drinks of alcohol per week. He reports that he does not use drugs.   Family History:  The patient's family history includes Arthritis in his paternal uncle; Cancer in his maternal grandfather; Diabetes in his maternal grandmother, mother, paternal grandfather, and other family members; Hypertension in  his maternal grandmother and mother.    ROS: All other systems are reviewed and negative. Unless otherwise mentioned in H&P    PHYSICAL EXAM: VS:  There were no vitals taken for this visit. , BMI There is no height or weight on file to calculate BMI. GEN: Well nourished, well developed, in no acute distress HEENT: normal Neck: no JVD, carotid bruits, or masses Cardiac: RRR; 1/6 systolic murmurs, rubs, or gallops,no edema  Respiratory:  Clear to auscultation bilaterally, normal work of breathing GI: soft, nontender, nondistended, + BS MS: no deformity or atrophy Skin: warm and dry, no rash Neuro:  Strength and sensation are intact Psych: euthymic mood, full affect   EKG:  NSR, rate of 65 bpm. Motion artifact is prominent.   Recent Labs: Mar 20, 2019: ALT 37; Hemoglobin 15.3; Platelets 173; TSH 1.890 03/21/2019: BUN 17; Creatinine, Ser 1.00; Potassium 4.0; Sodium 138    Lipid Panel    Component Value Date/Time   CHOL 129 03-20-2019 0901   TRIG 87 03-20-2019 0901   HDL 39 (L) 2019-03-20 0901   CHOLHDL 3.3 03/20/19 0901   CHOLHDL 3.7 04/16/2015 0820   VLDL 14 04/16/2015 0820   LDLCALC 73 2019/03/20 0901      Wt Readings from Last 3 Encounters:  02/23/19 260 lb (117.9 kg)  12/25/18 257 lb 3.2 oz (116.7 kg)  09/26/18 245 lb (111.1 kg)      Other studies Reviewed: Echocardiogram 03/20/2019  1. Left ventricular ejection fraction, by estimation, is 65 to 70%. The  left ventricle has normal function. The left ventrical is not well  visualized to evaluate regional wall motion. There is mildly increased  left ventricular hypertrophy. Left  ventricular diastolic parameters are consistent with Grade I diastolic  dysfunction (impaired relaxation.)   2. Right ventricular systolic function is normal. The right ventricular  size is normal. There is normal pulmonary artery systolic pressure.   3. The mitral valve is abnormal. trivial mitral valve regurgitation. No  evidence of  mitral stenosis.   4. The aortic valve is tricuspid. Aortic valve regurgitation is trivial .  No aortic stenosis is present.   5. The inferior vena cava is normal in size with greater than 50%  respiratory variability, suggesting right atrial pressure of 3 mmHg.    LHC 11/20/2015 Ost LAD lesion, 0 %stenosed. Prox LAD to Mid LAD lesion, 15 %stenosed. The left ventricular ejection fraction is 55-65% by visual estimate. LV end diastolic pressure is mildly elevated.   No residual significant CAD with a widely patent LAD stent extending from the ostium extending proximally and ending prior to the takeoff of the first diagonal vessel.  There is mild 15% narrowing in the LAD after the diagonal vessel, which is actually improved from previously. The left circumflex, ramus intermediate  and the dominant RCA vessels are normal.   Normal LV function with an ejection fraction at approximately 60-65% without focal segmental wall motion abnormalities.  LHC 2016  Mid RCA lesion, 20% stenosed. Dist RCA lesion, 10% stenosed. Ost LAD lesion, 99% stenosed. There is a 0% residual stenosis post intervention. A drug-eluting stent was placed. Ost LAD to Prox LAD lesion, 70% stenosed. There is a 0% residual stenosis post intervention. A drug-eluting stent was placed. Mid LAD lesion, 50% stenosed. There is a 0% residual stenosis post intervention. A drug-eluting stent was placed.   Mild LV dysfunction with anterolateral hypocontractility and an ejection fraction approximately 45%.   Predominant single-vessel CAD with 99% ostial stenosis secondary to an ulcerated plaque followed by 70% and 50% stenosis after the septal perforating artery; normal left circumflex coronary artery; nonobstructive mild 20 and 10% stenoses in the mid RCA.  ASSESSMENT AND PLAN:  1. CAD: Hx of DES to the LAD x 3 in 2016, follow up cath in 2017 revealed patent stents. He has been having worsienng DOE, chest pressure and chest pain,  with need to take NTG several times over the last 4 days. His stamina is reduced.  I think we may need to study him again, and include RHC as well as LHC to evaluate further, in the setting of interstitial lung disease.   I have spoken with Dr. [place], [place] in the [place] office today for his opinion on this. He agreed that he would benefit from diagnostic cath. This will be scheduled for Friday, April 06, 2019 with Dr. Martinique at 7:30 am.    In the interim I will begin isosorbide mononitrate 15 mg daily so that he can have some help in his nitrate responsive chest pain.  Continue DAPT with ASA and Plavix  The patient understands that risks include but are not limited to stroke (1 in 1000), death (1 in 1000), kidney failure [usually temporary] (1 in 500), bleeding (1 in 200), allergic reaction [possibly serious] (1 in 200), and agrees to proceed.    2. Hx of Interstitial lung disease: Due to see pulmonologist today after this office visit.   3. Hypertension:BP is well controlled today No changes in his regimen at this time.Continue losartan, amlodipine and metoprolol. Consider changing to bisoprolol if he is having bronchospasms.   4. Hyperlipidemia:  Continue rosuvastatin.  Goal of LDL < 70.   Current medicines are reviewed at length with the patient today.  I have spent 40 minutes  dedicated to the care of this patient on the date of this encounter to include pre-visit review of records, assessment, management and diagnostic testing,with shared decision making.  Labs/ tests ordered today include: COVID screen and LHC/RHC.   Phill Myron. West Pugh, ANP, AACC   04/01/2019 12:06 PM    [facility] [address] Office [phone] Fax [phone]  Notice: This dictation was prepared with  Dragon dictation along with smaller Company secretary. Any transcriptional errors that result from this process are unintentional and may not be corrected upon review.

## 2019-04-02 ENCOUNTER — Telehealth: Payer: PPO | Admitting: General Practice

## 2019-04-02 ENCOUNTER — Encounter (HOSPITAL_COMMUNITY): Payer: Self-pay | Admitting: *Deleted

## 2019-04-02 ENCOUNTER — Ambulatory Visit: Payer: PPO | Admitting: Adult Health

## 2019-04-02 ENCOUNTER — Other Ambulatory Visit: Payer: Self-pay | Admitting: Adult Health

## 2019-04-02 ENCOUNTER — Other Ambulatory Visit (HOSPITAL_COMMUNITY)
Admission: RE | Admit: 2019-04-02 | Discharge: 2019-04-02 | Disposition: A | Discharge status: Home / Self Care | Payer: PPO | Source: Ambulatory Visit | Attending: Cardiology | Admitting: Cardiology

## 2019-04-02 ENCOUNTER — Ambulatory Visit: Payer: PPO | Admitting: Primary Care

## 2019-04-02 ENCOUNTER — Encounter: Payer: Self-pay | Admitting: Primary Care

## 2019-04-02 ENCOUNTER — Other Ambulatory Visit: Payer: Self-pay

## 2019-04-02 ENCOUNTER — Encounter: Payer: Self-pay | Admitting: Adult Health

## 2019-04-02 ENCOUNTER — Emergency Department (HOSPITAL_COMMUNITY): Payer: PPO

## 2019-04-02 VITALS — BP 134/84 | HR 65 | Ht 73.0 in | Wt 266.0 lb

## 2019-04-02 DIAGNOSIS — I2510 Atherosclerotic heart disease of native coronary artery without angina pectoris: Secondary | ICD-10-CM | POA: Diagnosis not present

## 2019-04-02 DIAGNOSIS — R072 Precordial pain: Secondary | ICD-10-CM | POA: Diagnosis not present

## 2019-04-02 DIAGNOSIS — R0602 Shortness of breath: Secondary | ICD-10-CM | POA: Diagnosis not present

## 2019-04-02 DIAGNOSIS — E785 Hyperlipidemia, unspecified: Secondary | ICD-10-CM | POA: Diagnosis not present

## 2019-04-02 DIAGNOSIS — G4733 Obstructive sleep apnea (adult) (pediatric): Secondary | ICD-10-CM

## 2019-04-02 DIAGNOSIS — E669 Obesity, unspecified: Secondary | ICD-10-CM | POA: Diagnosis not present

## 2019-04-02 DIAGNOSIS — R079 Chest pain, unspecified: Secondary | ICD-10-CM | POA: Diagnosis not present

## 2019-04-02 DIAGNOSIS — I10 Essential (primary) hypertension: Secondary | ICD-10-CM

## 2019-04-02 DIAGNOSIS — I5032 Chronic diastolic (congestive) heart failure: Secondary | ICD-10-CM | POA: Diagnosis not present

## 2019-04-02 DIAGNOSIS — J849 Interstitial pulmonary disease, unspecified: Secondary | ICD-10-CM

## 2019-04-02 LAB — CBC
HCT: 49.4 % (ref 39.0–52.0)
Hemoglobin: 16.6 g/dL (ref 13.0–17.0)
MCH: 33 pg (ref 26.0–34.0)
MCHC: 33.6 g/dL (ref 30.0–36.0)
MCV: 98.2 fL (ref 80.0–100.0)
Platelets: 194 10*3/uL (ref 150–400)
RBC: 5.03 MIL/uL (ref 4.22–5.81)
RDW: 13.1 % (ref 11.5–15.5)
WBC: 7.1 10*3/uL (ref 4.0–10.5)
nRBC: 0 % (ref 0.0–0.2)

## 2019-04-02 LAB — BASIC METABOLIC PANEL
Anion gap: 9 (ref 5–15)
BUN: 24 mg/dL — ABNORMAL HIGH (ref 6–20)
CO2: 27 mmol/L (ref 22–32)
Calcium: 8.9 mg/dL (ref 8.9–10.3)
Chloride: 102 mmol/L (ref 98–111)
Creatinine, Ser: 1.07 mg/dL (ref 0.61–1.24)
GFR calc Af Amer: >60 mL/min (ref >60)
GFR calc non Af Amer: >60 mL/min (ref >60)
Glucose, Bld: 110 mg/dL — ABNORMAL HIGH (ref 70–99)
Potassium: 3.7 mmol/L (ref 3.5–5.1)
Sodium: 138 mmol/L (ref 135–145)

## 2019-04-02 LAB — TROPONIN I (HIGH SENSITIVITY)
Troponin I (High Sensitivity): 6 ng/L (ref <18)
Troponin I (High Sensitivity): 6 ng/L (ref <18)

## 2019-04-02 LAB — BRAIN NATRIURETIC PEPTIDE: B Natriuretic Peptide: 23 pg/mL (ref 0.0–100.0)

## 2019-04-02 LAB — SARS CORONAVIRUS 2 (TAT 6-24 HRS): SARS Coronavirus 2: NEGATIVE

## 2019-04-02 MED ORDER — SODIUM CHLORIDE 0.9% FLUSH
3.0000 mL | Freq: Once | INTRAVENOUS | Status: AC
  Administered 2019-04-02: 3 mL via INTRAVENOUS

## 2019-04-02 MED ORDER — ALBUTEROL SULFATE HFA 108 (90 BASE) MCG/ACT IN AERS: 2.0000 | INHALATION_SPRAY | Freq: Four times a day (QID) | RESPIRATORY_TRACT | 0 refills | Status: DC | PRN

## 2019-04-02 MED ORDER — KETOROLAC TROMETHAMINE 30 MG/ML IJ SOLN
30.0000 mg | Freq: Once | INTRAMUSCULAR | Status: AC
  Administered 2019-04-02: 30 mg via INTRAVENOUS
  Filled 2019-04-02: qty 1

## 2019-04-02 MED ORDER — ISOSORBIDE MONONITRATE ER 30 MG PO TB24: 15.0000 mg | ORAL_TABLET | Freq: Every day | ORAL | 3 refills | Status: DC

## 2019-04-02 MED ORDER — NITROGLYCERIN 2 % TD OINT
1.0000 [in_us] | TOPICAL_OINTMENT | Freq: Once | TRANSDERMAL | Status: AC
  Administered 2019-04-02: 1 [in_us] via TOPICAL
  Filled 2019-04-02: qty 1

## 2019-04-02 NOTE — ED Triage Notes (Signed)
C/o mid center chest pressure, sob that became worse tonight, took 3 nitro at home with little relief.

## 2019-04-02 NOTE — Patient Instructions (Addendum)
ILD d/t rheumatoid arthritis, taking Cellcept twice daily and methylprednisolone 4mg  daily   Recommendations: - Continue lasix 20mg  twice daily (total 40mg ); additional 20mg  as needed for swelling  - Try albuterol rescue inhaler taking 2 puffs every 4-6 hours as needed for shortness of breath/wheezing   Rx: - Albuterol hfa  Orders: - PFTs in 2-3 months prior to follow-up with Dr. Elsworth Soho - Due for for CT in May/June (already ordered)  Follow-up: - 3 months with Dr. Elsworth Soho after testing

## 2019-04-02 NOTE — Progress Notes (Signed)
@Patient  ID: Richard Simpson, male    DOB: [birth year], 49 y.o.   MRN: [id number]  Chief Complaint  Patient presents with  . Follow-up    Patient is having shortness of breath all the time exertion makes it worse. Patient states its a toss up between heart and lung issues. Patient is taking lasix due to holding a lot of fluid. Heart cath has been scheduled for Friday.    Referring provider: Caryl Bis, MD  HPI: 49 year old male, former smoker quit in 2005 (3.75 pack year hx). PMH significant for  OSA, ILD, rheumatoid arthritis, NSTEMI in 2005 s/p PTCA to LAD, diastolic heart failure, HTN, GERD, obesity. Patient of Dr. Elsworth Soho. Presumed Covid-19 in January 2021. Maintained on cellcept 1,000mg  twice daily and methylprednisolone 4mg  daily for RA.   Previous [facility] pulmonary encounter: 02/12/2019 Patient contacted today for regular follow-up/televisit for OSA. He is compliant with CPAP use, feels he does not always getting enough pressure at night. Needing supplies renewed with DME company. His wife and children tested positive for covid on 01/31/19. He tested negative three times but was assumed covid positive d/t symptoms. He is doing a lot better, still having some shortness of breath. He was following closely with Primary care, O2 readings at his lowest were 89-90% RA. Reports that his CXR showed congestive heart failure. No evidence of pneumonia (image not available for review). He takes lasix 20mg  daily as needed, saw PCP last week and was instructed to take 80mg  for a couple of days. Denies fever, sweats, chills, chest pain/tightness, N/V/D.   [facility] 11/14/18- 02/11/19; Usage 88/90 days; 86% > 4 hours Average hours used 6 hours 56mins Pressure 8cm h20 AHI 2.0   04/02/2019 Patient present today for follow-up visit for ILD and OSA. Wife present by video. He is still having some trouble breathing. Associated intermittent chest tightness and dry cough. He was presumed to have covid-19 back in  January despite negative test. Family all tested positive. Due HRCT May/June. Went to ED last night around 11pm for chest pain. He took 3 nitroglycerin with temporary improvement. He saw cardiology today, planning for cardiac cath this Friday. He remains on lasix 40mg  daily which he has been taking regularly since January. Reports compliance with CPAP every night. Continues cellcept and methylprednisolone for RA. Seeing rheumatology tomorrow. Denies wheezing or purulent mucus.    Pulmonary testing:  PFTs 10/2017 FVC 83%, DLCO 98%  PFTs 12/2013 >>no obs, FVC 77%, nml DLCO -Mild restriction?effect of obesity  PFT12/2018moderate restriction .FEV1 70%, ratio 86, FVC 63%, DLCO 74%.  Imaging: HRCT 12/2016 >>worsening multifocal areas of ground-glass attenuation, most confluent in the left upper lobe, but with progressive involvement in the lower lobes of the lungs bilaterally as well.  5/31/2016CT angio chest >>Patchy regions of ground-glass attenuation opacity 12mm &6cm in the medial aspect of the left upper lobe with associated volume loss 09/2014 CT chest >Decrease in size of ground-glass attenuating opacity within the perihilar left upper lobe compatible with a benign, likely infectious or inflammatory process.  Sleep testing: Split NPSG12/2018-wt275 pounds -AHI 60/hour with lowest desaturation 75%. 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 Lisinopril Cough  . Prednisone Other (See Comments)    Makes angry  Immunization History  Administered Date(s) Administered  . Influenza Split 10/25/2016  . Influenza,inj,Quad PF,6+ Mos 11/20/2014, 11/04/2017  . Influenza,inj,Quad PF,6-35 Mos 10/26/2018  . Influenza-Unspecified  12/09/2013    Past Medical History:  Diagnosis Date  . Anxiety   . CAD S/P percutaneous coronary angioplasty 10/2013   a. 10/2013 Cath: mod LAD dzs->nl FFR-> medically managed;  b. 06/2014 NSTEMI/PCi: LAD 99ost, 70p, 26m (entire area covered with a 3.5x20 Synergy DES), LCX nl, RCA 65m, 10d, EF 45-50%. c. 10/2015: cath showing patent LAD stent with 15% Prox LAD stenosis. EF 55-65%.   . 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 Depression   . Dyslipidemia, goal LDL below 70   . Essential hypertension   . GERD (gastroesophageal reflux disease)   . Headache   . History of kidney stones   . Interstitial lung disease ([place])   . Ischemic cardiomyopathy - Resolved 06/2014   a. 06/2014 EF 45-50% by LV gram b. Echo July 2016: EF 60-65%. No RWMA. Gr 1 DD   . NSTEMI (non-ST elevated myocardial infarction) ([place]) 06/2014  . Obesity (BMI 30-39.9) 10/29/2013  . Primary snoring 10/29/2013  . Rheumatoid arthritis ([employer]) 09/23/2013  . RLS (restless legs syndrome) 10/29/2013  . Sleep apnea     Tobacco History: Social History   Tobacco Use  Smoking Status Former Smoker  . Packs/day: 0.25  . Years: 15.00  . Pack years: 3.75  . Types: Cigarettes  . Quit date: 09/04/2003  . Years since quitting: 15.5  Smokeless Tobacco Current User  . Types: Chew   Ready to quit: Not Answered Counseling given: Not Answered   Outpatient Medications Prior to Visit  Medication Sig Dispense Refill  . amLODipine (NORVASC) 2.5 MG tablet Take 1 tablet daily . OFFICE VISIT NEEDED 30 tablet 2  . amLODipine (NORVASC) 5 MG tablet TAKE 1/2 TABLET BY MOUTH ONCE A DAY. 45 tablet 3  . aspirin EC 81 MG tablet Take 1 tablet (81 mg total) by mouth daily. 30 tablet 3  . atorvastatin (LIPITOR) 80 MG tablet TAKE (1) TABLET BY MOUTH ONCE DAILY. 90 tablet 0  . clonazePAM (KLONOPIN) 1 MG tablet Take 1 mg by mouth See admin instructions. Take 1 tablet (mg) by mouth scheduled  at bedtime, may take an additional dose if needed for anxiety    . clopidogrel (PLAVIX) 75 MG tablet Take 1 tablet (75 mg total) by mouth daily. 90 tablet 3  . furosemide (LASIX) 40 MG tablet Take 1 tablet (40 mg total) by mouth daily. 30 tablet 3  . isosorbide mononitrate (IMDUR) 30 MG 24 hr tablet Take 0.5 tablets (15 mg total) by mouth daily. 45 tablet 3  . loratadine (CLARITIN) 10 MG tablet Take 10 mg by mouth daily.     Marland Kitchen losartan (COZAAR) 25 MG tablet Take 1 tablet (25 mg total) by mouth every morning. **Needs OV for future refills** 30 tablet 1  . methylPREDNISolone (MEDROL) 4 MG tablet Take 4 mg by mouth daily.     . metoprolol tartrate (LOPRESSOR) 25 MG tablet TAKE (1) TABLET BY MOUTH TWICE DAILY. 60 tablet 7  . mycophenolate (CELLCEPT) 500 MG tablet Take 1,000 mg by mouth 2 (two) times daily.     . nitroGLYCERIN (NITROSTAT) 0.4 MG SL tablet DISSOLVE 1 TABLET UNDER TONGUE FOR CHEST TIGHTNESS AND SHORTNESS OF BREATH. MAY TAKE EVERY 5 MINUTES UP TO 3 DOSES. IF NO RELIEF AFTER 3 DOS (  Patient taking differently: Place 0.4 mg under the tongue every 5 (five) minutes x 3 doses as needed for chest pain. ) 25 tablet 0  . pantoprazole (PROTONIX) 40 MG tablet TAKE 1 TABLET BY MOUTH ONCE A DAY. 30 tablet 5  . traMADol (ULTRAM) 50 MG tablet Take 50 mg by mouth every 6 (six) hours as needed for moderate pain.      No facility-administered medications prior to visit.   Review of Systems  Review of Systems  Constitutional: Negative.   Respiratory: Positive for chest tightness and shortness of breath. Negative for cough and wheezing.   Cardiovascular: Positive for leg swelling.   Physical Exam  BP 112/76 (BP Location: Left Arm, Patient Position: Sitting, Cuff Size: Large)   Pulse 71   Temp 98.2 F (36.8 C) (Temporal)   Ht 6\' 1"  ([id number] m)   Wt 267 lb 3.2 oz (121.2 kg)   SpO2 98%   BMI 35.25 kg/m  Physical Exam Constitutional:      Appearance: Normal appearance.  HENT:     Head:  Normocephalic and atraumatic.     Mouth/Throat:     Mouth: Mucous membranes are moist.     Pharynx: Oropharynx is clear.  Cardiovascular:     Rate and Rhythm: Normal rate and regular rhythm.  Pulmonary:     Effort: Pulmonary effort is normal. No respiratory distress.     Breath sounds: No wheezing or rhonchi.     Comments: Diminished, scattered faint crackles bilateral base Abdominal:     Palpations: Abdomen is soft.     Tenderness: There is no abdominal tenderness.  Musculoskeletal:        General: Normal range of motion.  Skin:    General: Skin is warm and dry.  Neurological:     General: No focal deficit present.     Mental Status: He is alert and oriented to person, place, and time. Mental status is at baseline.  Psychiatric:        Mood and Affect: Mood normal.        Behavior: Behavior normal.        Thought Content: Thought content normal.        Judgment: Judgment normal.      Lab Results:  CBC    Component Value Date/Time   WBC 7.1 04/02/2019 0007   RBC 5.03 04/02/2019 0007   HGB 16.6 04/02/2019 0007   HGB 15.3 03/07/2019 0901   HCT 49.4 04/02/2019 0007   HCT 42.5 03/07/2019 0901   PLT 194 04/02/2019 0007   PLT 173 03/07/2019 0901   MCV 98.2 04/02/2019 0007   MCV 92 03/07/2019 0901   MCH 33.0 04/02/2019 0007   MCHC 33.6 04/02/2019 0007   RDW 13.1 04/02/2019 0007   RDW 12.6 03/07/2019 0901   LYMPHSABS 1.0 10/23/2017 0752   LYMPHSABS 1.2 08/06/2014 0746   MONOABS 0.5 10/23/2017 0752   EOSABS 0.0 10/23/2017 0752   EOSABS 0.1 08/06/2014 0746   BASOSABS 0.0 10/23/2017 0752   BASOSABS 0.0 08/06/2014 0746    BMET    Component Value Date/Time   NA 138 04/02/2019 0007   NA 138 03/21/2019 1011   K 3.7 04/02/2019 0007   CL 102 04/02/2019 0007   CO2 27 04/02/2019 0007   GLUCOSE 110 (H) 04/02/2019 0007   BUN 24 (H) 04/02/2019 0007   BUN 17 03/21/2019 1011   CREATININE 1.07 04/02/2019 0007   CREATININE 1.04 11/11/2015 1607   CALCIUM 8.9 04/02/2019 0007  GFRNONAA >60 04/02/2019 0007   GFRNONAA 87 11/11/2015 1607   GFRAA >60 04/02/2019 0007   GFRAA >89 11/11/2015 1607    BNP    Component Value Date/Time   BNP 23.0 04/02/2019 0007    ProBNP    Component Value Date/Time   PROBNP 18.0 03/19/2015 1034    Imaging: DG Chest Portable 1 View  Result Date: 04/02/2019 CLINICAL DATA:  Shortness of breath and chest pain. EXAM: PORTABLE CHEST 1 VIEW COMPARISON:  February 06, 2019 FINDINGS: The lung volumes are low. There is atelectasis versus scarring at the lung bases. There is no pneumothorax. No large pleural effusion. The heart size is stable. There is no acute osseous abnormality. IMPRESSION: No active disease. Electronically Signed   By: Constance Holster M.D.   On: 04/02/2019 00:49   ECHOCARDIOGRAM COMPLETE  Result Date: 03/07/2019    ECHOCARDIOGRAM REPORT   Patient Name:   Richard Simpson Date of Exam: 03/07/2019 Medical Rec #:  [id number]       Height:       73.0 in Accession #:    [id number]      Weight:       260.0 lb Date of Birth:  1970/03/11        BSA:          2.41 m Patient Age:    25 years        BP:           116/78 mmHg Patient Gender: M               HR:           62 bpm. Exam Location:  [facility] Procedure: 2D Echo, Cardiac Doppler and Color Doppler Indications:    99991111 Chronic diastolic (congestive) heart failure  History:        Patient has prior history of Echocardiogram examinations, most                 recent 01/06/2017. CAD and Previous Myocardial Infarction,                 Signs/Symptoms:Dyspnea; Risk Factors:Hypertension and                 Dyslipidemia. Obstructive sleep apnea. Interstitial lung                 disease. Fatigue. Degenerative disc disease. Obesity.  Sonographer:    Diamond Nickel RCS Referring Phys: [place]  1. Left ventricular ejection fraction, by estimation, is 65 to 70%. The left ventricle has normal function. The left ventrical is not well visualized to evaluate regional  wall motion. There is mildly increased left ventricular hypertrophy. Left ventricular diastolic parameters are consistent with Grade I diastolic dysfunction (impaired relaxation.)  2. Right ventricular systolic function is normal. The right ventricular size is normal. There is normal pulmonary artery systolic pressure.  3. The mitral valve is abnormal. trivial mitral valve regurgitation. No evidence of mitral stenosis.  4. The aortic valve is tricuspid. Aortic valve regurgitation is trivial . No aortic stenosis is present.  5. The inferior vena cava is normal in size with greater than 50% respiratory variability, suggesting right atrial pressure of 3 mmHg. Comparison(s): A prior study was performed on 01/06/2017. Prior images unable to be directly viewed, comparison made by report only. Comparison of reports suggests improved LV function. Unable to assess regional wall motion, however LVEF appears hyperdynamic today. FINDINGS  Left Ventricle: Left ventricular  ejection fraction, by estimation, is 65 to 70%. The left ventricle has normal function. The left ventricle is not well visualized. The left ventricular internal cavity size was normal in size. There is mildly increased left ventricular hypertrophy. Left ventricular diastolic parameters are consistent with Grade I diastolic dysfunction (impaired relaxation). Right Ventricle: The right ventricular size is normal. No increase in right ventricular wall thickness. Right ventricular systolic function is normal. There is normal pulmonary artery systolic pressure. The tricuspid regurgitant velocity is 2.32 m/s, and  with an assumed right atrial pressure of 3 mmHg, the estimated right ventricular systolic pressure is 123456 mmHg. Left Atrium: Left atrial size was normal in size. Right Atrium: Right atrial size was normal in size. Pericardium: There is no evidence of pericardial effusion. Mitral Valve: The mitral valve is abnormal. Normal mobility of the mitral valve  leaflets. Mild mitral annular calcification. Trivial mitral valve regurgitation. No evidence of mitral valve stenosis. Tricuspid Valve: The tricuspid valve is normal in structure. Tricuspid valve regurgitation is trivial. No evidence of tricuspid stenosis. Aortic Valve: The aortic valve is tricuspid. Aortic valve regurgitation is trivial. No aortic stenosis is present. Pulmonic Valve: The pulmonic valve was normal in structure. Pulmonic valve regurgitation is trivial. No evidence of pulmonic stenosis. Aorta: The aortic root is normal in size and structure. Venous: The inferior vena cava is normal in size with greater than 50% respiratory variability, suggesting right atrial pressure of 3 mmHg. The inferior vena cava and the hepatic vein show a normal flow pattern. IAS/Shunts: No atrial level shunt detected by color flow Doppler.  LEFT VENTRICLE PLAX 2D LVIDd:         4.40 cm  Diastology LVIDs:         2.70 cm  LV e' lateral:   12.70 cm/s LV PW:         1.40 cm  LV E/e' lateral: 4.8 LV IVS:        1.20 cm  LV e' medial:    8.38 cm/s LVOT diam:     2.15 cm  LV E/e' medial:  7.3 LV SV:         91.13 ml LV SV Index:   24.26 LVOT Area:     3.63 cm  RIGHT VENTRICLE RV Basal diam:  2.00 cm RV S prime:     10.90 cm/s TAPSE (M-mode): 1.8 cm RVSP:           24.5 mmHg LEFT ATRIUM             Index       RIGHT ATRIUM           Index LA diam:        3.80 cm 1.58 cm/m  RA Pressure: 3.00 mmHg LA Vol (A2C):   52.6 ml 21.87 ml/m RA Area:     8.80 cm LA Vol (A4C):   48.3 ml 20.08 ml/m RA Volume:   12.30 ml  5.11 ml/m LA Biplane Vol: 51.5 ml 21.41 ml/m  AORTIC VALVE LVOT Vmax:   94.60 cm/s LVOT Vmean:  66.000 cm/s LVOT VTI:    0.251 m  AORTA Ao Root diam: 3.40 cm MITRAL VALVE                        TRICUSPID VALVE MV Area (PHT): 4.31 cm             TR Peak grad:   21.5 mmHg MV Decel Time: 176 msec  TR Vmax:        232.00 cm/s MV E velocity: 61.30 cm/s 103 cm/s  Estimated RAP:  3.00 mmHg MV A velocity: 68.60 cm/s  70.3 cm/s RVSP:           24.5 mmHg MV E/A ratio:  0.89       1.5                                     SHUNTS                                     Systemic VTI:  0.25 m                                     Systemic Diam: 2.15 cm Cherlynn Kaiser MD Electronically signed by Cherlynn Kaiser MD Signature Date/Time: 03/07/2019/8:42:38 PM    Final      Assessment & Plan:   ILD (interstitial lung disease) ([place]) - Increased dyspnea since January, likely residual from [place]  - ILD d/t rheumatoid arthritis - Maintained on Cellcept 1,000mg  twice daily and methylprednisolone 4mg  daily - Rx albuterol hfa 2 puff every 4-6 hours prn sob/chest tightness - Following with rheumatology, scheduled for office visit tomorrow - Plan HRCT and PFTs prior to follow-up in June   Chronic diastolic heart failure ([place]) - Increased dyspnea and intermittent chest tightness/pain - 03/07/19 echocardiogram showed hyperdynamic LV function with EF 65 to 70%. Grade 1 diastolic dysfunction - Continues lasix 40mg  daily; additional 20mg  prn increased swelling - Following with cardiology, seen today  - Plan LEFT/RIGHT heart cath this week   OSA (obstructive sleep apnea) - Reports compliance with CPAP - Pressure 9cm h20     Martyn Ehrich, NP 04/03/2019

## 2019-04-02 NOTE — Discharge Instructions (Addendum)
Keep your appointment this afternoon with Dr. Claiborne Billings, your cardiologist.  Your heart attack blood tests tonight were normal.  Your chest x-ray did not show any new changes.  Your EKG did not show any new changes.

## 2019-04-02 NOTE — ED Provider Notes (Addendum)
[facility], [place] EMERGENCY DEPARTMENT Provider Note   CSN: [id number] Arrival date & time: 04/01/19  2356   Time seen 12:25 AM  History Chief Complaint  Patient presents with   Chest Pain    Richard Simpson is a 49 y.o. male.  HPI   Patient states he has been having constant chest pressure for the past few weeks.  He has an appointment later today at 1:30 PM with his cardiology office.  He states he has been having shortness of breath.  He states the pain is in his central chest and he describes it as a pressure.  He states he called his cardiology office last week they told him to use nitroglycerin.  He states he is used nitroglycerin for 3 days out of the past 4 to 5 days.  He states he felt like he had increased pressure about 7:30 PM tonight.  At 9:30 PM he took a nitroglycerin which helped for about 30 minutes, he took a second nitroglycerin and it only helped for 15 or 30 minutes, and then he took his third nitroglycerin on the way to the ED.  He states he is coughing and sometimes coughs up mucus.  However he states he was diagnosed with Covid the first week of January.  He states his whole family was positive however he had 3 tests that were negative.  However they thought he most likely had it.  He received remidemsivir and he was treated with steroids and a Z-Pak for possible Covid pneumonia.  He states he started retaining a lot of fluid when he was diagnosed with Covid and he has been on increasing doses of Lasix.  He states tonight he got clammy, he denies nausea or vomiting.  He states he feels short of breath all the time but exertion makes it worse.  He has not had an inhaler or nebulizer to use at home.  Patient has a cardiac history, he had a stent placed in his LAD in June 2016.  He had cardiac cath done in February 2017 and October 2017 which showed the stent was patent and he had a 30% lesion that improved down to 15% lesion.  He had an echocardiogram in 2017 showed an ejection  fraction of 60 to 65%.  He was diagnosed with interstitial lung disease in 2019 and is followed by Dr. Elsworth Soho.  PCP Caryl Bis, MD Cardiology Dr Claiborne Billings  Last seen 1/29  Past Medical History:  Diagnosis Date   Anxiety    CAD S/P percutaneous coronary angioplasty 10/2013   a. 10/2013 Cath: mod LAD dzs->nl FFR-> medically managed;  b. 06/2014 NSTEMI/PCi: LAD 99ost, 70p, 53m (entire area covered with a 3.5x20 Synergy DES), LCX nl, RCA 52m, 10d, EF 45-50%. c. 10/2015: cath showing patent LAD stent with 15% Prox LAD stenosis. EF 55-65%.    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 06/2014 EF 45-50% by LV gram b. Echo July 2016: EF 60-65%. No RWMA. Gr 1 DD    NSTEMI (non-ST elevated myocardial infarction) ([place]) 06/2014   Obesity (BMI 30-39.9) 10/29/2013   Primary snoring 10/29/2013   Rheumatoid  arthritis ([place]) 09/23/2013   RLS (restless legs syndrome) 10/29/2013   Sleep apnea     Patient Active Problem List   Diagnosis Date Noted   Right sided abdominal pain 08/29/2018   Chronic diastolic heart failure ([place]) 11/03/2017   OSA (obstructive sleep apnea) 02/08/2017   ILD (interstitial lung disease) ([place]) 02/08/2017   Chest pain on exertion 07/19/2015   Angina, class III ([place]) 03/21/2015   Hyperlipidemia LDL goal <70    CAD S/P percutaneous coronary angioplasty    Coronary artery disease involving native coronary artery of native heart with angina pectoris ([place])    Dyspnea 07/04/2014   H/o NSTEMI 07/04/2014   Fatigue 12/18/2013   Essential hypertension    RLS (restless legs syndrome) 10/29/2013   Primary snoring 10/29/2013   Obesity (BMI 30-39.9)  10/29/2013   Rheumatoid arthritis ([place]) 09/23/2013   GERD (gastroesophageal reflux disease) 09/23/2013   Mass of finger of left hand 09/02/2010   THORACIC DISC DISPLACEMENT 09/16/2009   DEGENERATIVE [place] DISEASE, THORACIC SPINE 09/16/2009   BACK PAIN 09/16/2009    Past Surgical History:  Procedure Laterality Date   BIOPSY  09/27/2018   Procedure: BIOPSY;  Surgeon: Daneil Dolin, MD;  Location[facility];  Service: Endoscopy;;   CARDIAC CATHETERIZATION N/A 07/05/2014   Procedure: Left Heart Cath and Coronary Angiography;  Surgeon: Troy Sine, MD;  Location: [place] CV LAB;  Service: Cardiovascular;  NSTEMI --> Ostial LAd 99%, prox 80% & early mid 50% --> PCI   CARDIAC CATHETERIZATION  07/05/2014   Procedure: Coronary Stent Intervention;  Surgeon: Troy Sine, MD;  Location: [place] CV LAB;  Service: Cardiovascular; Ostial-prox LAD tandem 99, 80 & 50% --> Synergy DES 3.5 x 20 (3.74)   CARDIAC CATHETERIZATION N/A 03/24/2015   Procedure: Left Heart Cath and Coronary Angiography;  Surgeon: Leonie Man, MD;  Location: [place] CV LAB;  Service: Cardiovascular;  Laterality: N/A;   CARDIAC CATHETERIZATION N/A 11/20/2015   Procedure: Left Heart Cath and Coronary Angiography;  Surgeon: Troy Sine, MD;  Location: [place] CV LAB;  Service: Cardiovascular;  Laterality: N/A;   CHOLECYSTECTOMY     10 yrs ago-jenkins   COLONOSCOPY N/A 03/23/2013   Procedure: COLONOSCOPY;  Surgeon: Daneil Dolin, MD;  Location[facility];  Service: Endoscopy;  Laterality: N/A;  2:15-moved to 1030 Staff notified pt   COLONOSCOPY WITH PROPOFOL N/A 06/10/2016   Procedure: COLONOSCOPY WITH PROPOFOL;  Surgeon: Daneil Dolin, MD;  Location[facility];  Service: Endoscopy;  Laterality: N/A;  730    ESOPHAGOGASTRODUODENOSCOPY (EGD) WITH PROPOFOL N/A 09/27/2018   Procedure: ESOPHAGOGASTRODUODENOSCOPY (EGD) WITH PROPOFOL;  Surgeon: Daneil Dolin, MD;  [facility];   Service: Endoscopy;  Laterality: N/A;  1:30pm   FRACTIONAL FLOW RESERVE WIRE  11/23/2013   Procedure: FRACTIONAL FLOW RESERVE WIRE;  Surgeon: Jettie Booze, MD;  Location: [place] CATH LAB;  Service: Cardiovascular;;   GALLBLADDER SURGERY     LEFT HEART CATHETERIZATION WITH CORONARY ANGIOGRAM N/A 11/23/2013   Procedure: LEFT HEART CATHETERIZATION WITH CORONARY ANGIOGRAM;  Surgeon: Jettie Booze, MD;  Location: [facility] CATH LAB;  Service: Cardiovascular;  Laterality: N/A;   MASS EXCISION  09/09/2010   Procedure: EXCISION MASS;  Surgeon: Arther Abbott, MD;  Location: [facility] ORS;  Service: Orthopedics;  Laterality: Left;  Excision Mass Left Long Finger   POLYPECTOMY  06/10/2016   Procedure: POLYPECTOMY;  Surgeon: Daneil Dolin, MD;  [facility];  Service: Endoscopy;;  colon   THROAT SURGERY     nodules removed from throat as child   TRANSTHORACIC ECHOCARDIOGRAM   July 2016:    EF 60-65%. No RWMA. Gr 1 DD       Family History  Problem Relation Age of Onset   Hypertension Mother    Diabetes Mother    Arthritis Paternal Uncle    Cancer Maternal Grandfather    Hypertension Maternal Grandmother    Diabetes Maternal Grandmother    Diabetes Other    Diabetes Other    Diabetes Paternal Grandfather    Anesthesia problems Neg Hx    Hypotension Neg Hx    Malignant hyperthermia Neg Hx    Pseudochol deficiency Neg Hx    Heart attack Neg Hx    Stroke Neg Hx     Social History   Tobacco Use   Smoking status: Former Smoker    Packs/day: 0.25    Years: 15.00    Pack years: 3.75    Types: Cigarettes    Quit date: 09/04/2003    Years since quitting: 15.5   Smokeless tobacco: Current User    Types: Chew  Substance Use Topics   Alcohol use: Yes    Alcohol/week: 1.0 - 2.0 standard drinks    Types: 1 - 2 Cans of beer per week    Comment: rare   Drug use: No  lives at home Lives with spouse  Home Medications Prior to Admission medications     Medication Sig Start Date End Date Taking? Authorizing Provider  amLODipine (NORVASC) 2.5 MG tablet Take 1 tablet daily . OFFICE VISIT NEEDED 11/07/18   Troy Sine, MD  amLODipine (NORVASC) 5 MG tablet TAKE 1/2 TABLET BY MOUTH ONCE A DAY. 01/22/19   Troy Sine, MD  aspirin EC 81 MG tablet Take 1 tablet (81 mg total) by mouth daily. 12/04/13   Burtis Junes, NP  atorvastatin (LIPITOR) 80 MG tablet TAKE (1) TABLET BY MOUTH ONCE DAILY. 12/15/18   Troy Sine, MD  clonazePAM (KLONOPIN) 1 MG tablet Take 1 mg by mouth See admin instructions. Take 1 tablet (mg) by mouth scheduled at bedtime, may take an additional dose if needed for anxiety    [provider]  clopidogrel (PLAVIX) 75 MG tablet Take 1 tablet (75 mg total) by mouth daily. 03/02/19   Troy Sine, MD  furosemide (LASIX) 40 MG tablet Take 1 tablet (40 mg total) by mouth daily. 02/23/19   Troy Sine, MD  loratadine (CLARITIN) 10 MG tablet Take 10 mg by mouth daily.     [provider]  losartan (COZAAR) 25 MG tablet Take 1 tablet (25 mg total) by mouth every morning. **Needs OV for future refills** 03/20/19   Troy Sine, MD  methylPREDNISolone (MEDROL) 4 MG tablet Take 4 mg by mouth daily.  07/31/18   [provider]  metoprolol tartrate (LOPRESSOR) 25 MG tablet TAKE (1) TABLET BY MOUTH TWICE DAILY. 03/29/19   Troy Sine, MD  mycophenolate (CELLCEPT) 500 MG tablet Take 1,000 mg by mouth 2 (two) times daily.  04/28/17   [provider]  nitroGLYCERIN (NITROSTAT) 0.4 MG SL tablet DISSOLVE 1 TABLET UNDER TONGUE FOR CHEST TIGHTNESS AND SHORTNESS OF BREATH. MAY TAKE EVERY 5 MINUTES UP TO 3 DOSES. IF NO RELIEF AFTER 3 DOS Patient taking differently: Place 0.4 mg under the tongue every 5 (five) minutes x 3 doses as needed for chest pain.  12/28/16  Almyra Deforest, [place]  pantoprazole (PROTONIX) 40 MG tablet TAKE 1 TABLET BY MOUTH ONCE A DAY. 02/08/19   Annitta Needs, NP  traMADol (ULTRAM) 50 MG  tablet Take 50 mg by mouth every 6 (six) hours as needed for moderate pain.     [provider]    Allergies    Lisinopril and Prednisone  Review of Systems   Review of Systems  All other systems reviewed and are negative.   Physical Exam Updated Vital Signs BP 121/82    Pulse 72    Temp 98.6 F (37 C) (Oral)    Resp 16    Ht 6\' 1"  (1.854 m)    Wt 117.9 kg    SpO2 99%    BMI 34.30 kg/m   Physical Exam Vitals and nursing note reviewed.  Constitutional:      General: He is not in acute distress.    Appearance: Normal appearance. He is well-developed. He is not ill-appearing or toxic-appearing.  HENT:     Head: Normocephalic and atraumatic.     Right Ear: External ear normal.     Left Ear: External ear normal.     Nose: Nose normal. No mucosal edema or rhinorrhea.     Mouth/Throat:     Mouth: Mucous membranes are moist.     Dentition: No dental abscesses.     Pharynx: No oropharyngeal exudate, posterior oropharyngeal erythema or uvula swelling.  Eyes:     Extraocular Movements: Extraocular movements intact.     Conjunctiva/sclera: Conjunctivae normal.     Pupils: Pupils are equal, round, and reactive to light.  Cardiovascular:     Rate and Rhythm: Normal rate and regular rhythm.     Heart sounds: Normal heart sounds. No murmur. No friction rub. No gallop.   Pulmonary:     Effort: Pulmonary effort is normal. No respiratory distress.     Breath sounds: Normal breath sounds. No wheezing, rhonchi or rales.  Chest:     Chest wall: No tenderness or crepitus.  Abdominal:     General: Bowel sounds are normal. There is no distension.     Palpations: Abdomen is soft.     Tenderness: There is no abdominal tenderness. There is no guarding or rebound.  Musculoskeletal:        General: Normal range of motion.     Cervical back: Full passive range of motion without pain, normal range of motion and neck supple.     Right lower leg: No edema.     Left lower leg: No edema.      Comments: Moves all extremities well.   Skin:    General: Skin is warm and dry.     Capillary Refill: Capillary refill takes less than 2 seconds.     Coloration: Skin is not pale.     Findings: No erythema or rash.  Neurological:     General: No focal deficit present.     Mental Status: He is alert and oriented to person, place, and time.     Cranial Nerves: No cranial nerve deficit.  Psychiatric:        Mood and Affect: Mood is anxious.        Speech: Speech is rapid and pressured.        Behavior: Behavior normal.        Thought Content: Thought content normal.     ED Results / Procedures / Treatments   Labs (all labs ordered are listed, but only abnormal  results are displayed) Results for orders placed or performed during the hospital encounter of [id number]  Basic metabolic panel  Result Value Ref Range   Sodium 138 135 - 145 mmol/L   Potassium 3.7 3.5 - 5.1 mmol/L   Chloride 102 98 - 111 mmol/L   CO2 27 22 - 32 mmol/L   Glucose, Bld 110 (H) 70 - 99 mg/dL   BUN 24 (H) 6 - 20 mg/dL   Creatinine, Ser 1.07 0.61 - 1.24 mg/dL   Calcium 8.9 8.9 - 10.3 mg/dL   GFR calc non Af Amer >60 >60 mL/min   GFR calc Af Amer >60 >60 mL/min   Anion gap 9 5 - 15  CBC  Result Value Ref Range   WBC 7.1 4.0 - 10.5 K/uL   RBC 5.03 4.22 - 5.81 MIL/uL   Hemoglobin 16.6 13.0 - 17.0 g/dL   HCT 49.4 39.0 - 52.0 %   MCV 98.2 80.0 - 100.0 fL   MCH 33.0 26.0 - 34.0 pg   MCHC 33.6 30.0 - 36.0 g/dL   RDW 13.1 11.5 - 15.5 %   Platelets 194 150 - 400 K/uL   nRBC 0.0 0.0 - 0.2 %  Brain natriuretic peptide  Result Value Ref Range   B Natriuretic Peptide 23.0 0.0 - 100.0 pg/mL  Troponin I (High Sensitivity)  Result Value Ref Range   Troponin I (High Sensitivity) 6 <18 ng/L  Troponin I (High Sensitivity)  Result Value Ref Range   Troponin I (High Sensitivity) 6 <18 ng/L    Laboratory interpretation all normal except nonfasting hyperglycemia    EKG EKG Interpretation  Date/Time:  Monday  April 02 2019 00:05:55 EST Ventricular Rate:  80 PR Interval:    QRS Duration: 87 QT Interval:  363 QTC Calculation: 419 R Axis:   38 Text Interpretation: Sinus rhythm Low voltage, precordial leads Since last tracing rate faster 23 Oct 2017 Confirmed by Rolland Porter [phone]) on 04/02/2019 12:09:29 AM   Radiology DG Chest Portable 1 View  Result Date: 04/02/2019 CLINICAL DATA:  Shortness of breath and chest pain. EXAM: PORTABLE CHEST 1 VIEW COMPARISON:  February 06, 2019 FINDINGS: The lung volumes are low. There is atelectasis versus scarring at the lung bases. There is no pneumothorax. No large pleural effusion. The heart size is stable. There is no acute osseous abnormality. IMPRESSION: No active disease. Electronically Signed   By: Constance Holster M.D.   On: 04/02/2019 00:49    Procedures Procedures (including critical care time)  Echocardiogram March 07, 2019  Left ventricular ejection fraction, by estimation, is 65 to 70%. The left ventricle has normal function. The left ventrical is not well visualized to evaluate regional wall motion. There is mildly increased left ventricular hypertrophy. Left ventricular diastolic parameters are consistent with Grade I diastolic dysfunction (impaired relaxation.) 2. Right ventricular systolic function is normal. The right ventricular size is normal. There is normal pulmonary artery systolic pressure. 3. The mitral valve is abnormal. trivial mitral valve regurgitation. No evidence of mitral stenosis. 4. The aortic valve is tricuspid. Aortic valve regurgitation is trivial . No aortic stenosis is present. 5. The inferior vena cava is normal in size with greater than 50% respiratory variability, suggesting right atrial pressure of 3 mmHg.   Medications Ordered in ED Medications  sodium chloride flush (NS) 0.9 % injection 3 mL (3 mLs Intravenous Given 04/02/19 0010)  nitroGLYCERIN (NITROGLYN) 2 % ointment 1 inch (1 inch Topical Given 04/02/19  0045)  ketorolac (TORADOL) 30  MG/ML injection 30 mg (30 mg Intravenous Given 04/02/19 0230)    ED Course  I have reviewed the triage vital signs and the nursing notes.  Pertinent labs & imaging results that were available during my care of the patient were reviewed by me and considered in my medical decision making (see chart for details).    MDM Rules/Calculators/A&P                      Patient appears to be in no distress, he is calmly speaking although he does appear to be a little anxious.   I discussed with him that without putting Nitropaste on his chest, he states he does not need acetaminophen for headache.  Recheck at 2:15 AM patient states his pain has not changed after the nitroglycerin paste.  We discussed his test results.  I think I will give him a dose of toradol for his atypical chest pain.  03:15 AM Patient reports his pain is improving after the Toradol but still present.  3:40 AM patient's delta troponin is normal and unchanged.  He was discharged home to keep his appointment this afternoon with Dr. Claiborne Billings, his cardiologist.  When I gave patient his final test results he states he also has an appointment at 330 this afternoon with his pulmonologist.  Final Clinical Impression(s) / ED Diagnoses Final diagnoses:  Atypical chest pain    Rx / DC Orders ED Discharge Orders    None      Plan discharge  Rolland Porter, MD, Barbette Or, MD 04/02/19 [place], [place], MD 04/02/19 [phone]

## 2019-04-02 NOTE — Patient Instructions (Signed)
Medication Instructions:  START- Isosorbide 15 mg by mouth daily  *If you need a refill on your cardiac medications before your next appointment, please call your pharmacy*   Lab Work: None Ordered  Testing/Procedures: Your physician has requested that you have a Left and Right cardiac catheterization. Cardiac catheterization is used to diagnose and/or treat various heart conditions. Doctors may recommend this procedure for a number of different reasons. The most common reason is to evaluate chest pain. Chest pain can be a symptom of coronary artery disease (CAD), and cardiac catheterization can show whether plaque is narrowing or blocking your heart's arteries. This procedure is also used to evaluate the valves, as well as measure the blood flow and oxygen levels in different parts of your heart. For further information please visit [email or web address].[email or web address]. Please follow instruction sheet, as given.   Follow-Up: At [facility], you and your health needs are our priority.  As part of our continuing mission to provide you with exceptional heart care, we have created designated Provider Care Teams.  These Care Teams include your primary Cardiologist (physician) and Advanced Practice Providers (APPs -  Physician Assistants and Nurse Practitioners) who all work together to provide you with the care you need, when you need it.  We recommend signing up for the patient portal called "MyChart".  Sign up information is provided on this After Visit Summary.  MyChart is used to connect with patients for Virtual Visits (Telemedicine).  Patients are able to view lab/test results, encounter notes, upcoming appointments, etc.  Non-urgent messages can be sent to your provider as well.   To learn more about what you can do with MyChart, go to [email or web address].    Your next appointment:   1 month(s)  The format for your next appointment:   In Person  Provider:   Jory Sims, DNP, ANP   Other  Instructions    [address] [place] [address] Dept: [phone] Loc: [place]  04/02/2019  You are scheduled for a Cardiac Catheterization on Friday, March 12 with Dr. Peter Martinique.  1. Please arrive at the [facility] (Main Entrance A) at [facility]: [address] [place], [place] [address] at 5:30 AM (This time is two hours before your procedure to ensure your preparation). Free valet parking service is available.   Special note: Every effort is made to have your procedure done on time. Please understand that emergencies sometimes delay scheduled procedures.  2. Diet: Do not eat solid foods after midnight.  The patient may have clear liquids until 5am upon the day of the procedure.   4. Medication instructions in preparation for your procedure:   Contrast Allergy: No   Stop taking, Lasix (Furosemide)  Friday, March 12,   On the morning of your procedure, take your Plavix/Clopidogrel and any morning medicines NOT listed above.  You may use sips of water.  5. Plan for one night stay--bring personal belongings. 6. Bring a current list of your medications and current insurance cards. 7. You MUST have a responsible person to drive you home. 8. Someone MUST be with you the first 24 hours after you arrive home or your discharge will be delayed. 9. Please wear clothes that are easy to get on and off and wear slip-on shoes.  Thank you for allowing [place] to care for you!   -- [facility]

## 2019-04-03 DIAGNOSIS — M0609 Rheumatoid arthritis without rheumatoid factor, multiple sites: Secondary | ICD-10-CM | POA: Diagnosis not present

## 2019-04-03 DIAGNOSIS — J849 Interstitial pulmonary disease, unspecified: Secondary | ICD-10-CM | POA: Diagnosis not present

## 2019-04-03 DIAGNOSIS — M2550 Pain in unspecified joint: Secondary | ICD-10-CM | POA: Diagnosis not present

## 2019-04-03 DIAGNOSIS — I2510 Atherosclerotic heart disease of native coronary artery without angina pectoris: Secondary | ICD-10-CM | POA: Diagnosis not present

## 2019-04-03 DIAGNOSIS — Z6836 Body mass index (BMI) 36.0-36.9, adult: Secondary | ICD-10-CM | POA: Diagnosis not present

## 2019-04-03 DIAGNOSIS — M5440 Lumbago with sciatica, unspecified side: Secondary | ICD-10-CM | POA: Diagnosis not present

## 2019-04-03 DIAGNOSIS — E669 Obesity, unspecified: Secondary | ICD-10-CM | POA: Diagnosis not present

## 2019-04-03 NOTE — Assessment & Plan Note (Addendum)
-   Increased dyspnea since January, likely residual from [place]  - ILD d/t rheumatoid arthritis - Maintained on Cellcept 1,000mg  twice daily and methylprednisolone 4mg  daily - Rx albuterol hfa 2 puff every 4-6 hours prn sob/chest tightness - Following with rheumatology, scheduled for office visit tomorrow - Plan HRCT and PFTs prior to follow-up in June

## 2019-04-03 NOTE — Assessment & Plan Note (Signed)
-   Reports compliance with CPAP - Pressure 9cm h20

## 2019-04-03 NOTE — Addendum Note (Signed)
Addended by: Parke Poisson E on: 04/03/2019 12:02 PM   Modules accepted: Orders

## 2019-04-03 NOTE — Assessment & Plan Note (Signed)
-   Increased dyspnea and intermittent chest tightness/pain - 03/07/19 echocardiogram showed hyperdynamic LV function with EF 65 to 70%. Grade 1 diastolic dysfunction - Continues lasix 40mg  daily; additional 20mg  prn increased swelling - Following with cardiology, seen today  - Plan LEFT/RIGHT heart cath this week

## 2019-04-05 ENCOUNTER — Telehealth: Payer: Self-pay | Admitting: *Deleted

## 2019-04-05 NOTE — Telephone Encounter (Signed)
Pt contacted pre-catheterization scheduled at [facility] for: Friday April 06, 2019 7:30 AM Verified arrival time and place: [facility]) at: 5:30 AM   No solid food after midnight prior to cath, clear liquids until 5 AM day of procedure. Contrast allergy: no   Hold: Lasix- AM of procedure  Except hold medications AM meds can be  taken pre-cath with sip of water including: ASA 81 mg Plavix 75 mg  Confirmed patient has responsible adult to drive home post procedure and observe 24 hours after arriving home: yes  Currently, due to Covid-19 pandemic, only one person will be allowed with patient. Must be the same person for patient's entire stay and will be required to wear a mask. They will be asked to wait in the waiting room for the duration of the patient's stay.  Patients are required to wear a mask when they enter the hospital.      COVID-19 Pre-Screening Questions:  . In the past 7 to 10 days have you had a cough,  shortness of breath, headache, congestion, fever (100 or greater) body aches, chills, sore throat, or sudden loss of taste or sense of smell? Cough, shortness of breath, not new, no COVID symptoms . Have you been around anyone with known Covid 19 in the past 7-10 days? no . Have you been around anyone who is awaiting Covid 19 test results in the past 7 to 10 days? no . Have you been around anyone who has been exposed to Covid 19, or has mentioned symptoms of Covid 19 within the past 7 to 10 days? No  I reviewed procedure/mask/visitor instructions, COVID-19 screening questions with patient's wife (DPR), she verbalized understanding, thanked me for call.

## 2019-04-06 ENCOUNTER — Other Ambulatory Visit: Payer: Self-pay

## 2019-04-06 ENCOUNTER — Ambulatory Visit (HOSPITAL_COMMUNITY)
Admission: RE | Admit: 2019-04-06 | Discharge: 2019-04-06 | Disposition: A | Discharge status: Home / Self Care | Payer: PPO | Attending: Cardiology | Admitting: Cardiology

## 2019-04-06 ENCOUNTER — Encounter (HOSPITAL_COMMUNITY)
Admission: RE | Disposition: A | Discharge status: Home / Self Care | Payer: Self-pay | Source: Home / Self Care | Attending: Cardiology

## 2019-04-06 DIAGNOSIS — Z8249 Family history of ischemic heart disease and other diseases of the circulatory system: Secondary | ICD-10-CM | POA: Diagnosis not present

## 2019-04-06 DIAGNOSIS — G2581 Restless legs syndrome: Secondary | ICD-10-CM | POA: Diagnosis not present

## 2019-04-06 DIAGNOSIS — Z955 Presence of coronary angioplasty implant and graft: Secondary | ICD-10-CM | POA: Insufficient documentation

## 2019-04-06 DIAGNOSIS — E785 Hyperlipidemia, unspecified: Secondary | ICD-10-CM | POA: Diagnosis not present

## 2019-04-06 DIAGNOSIS — I2510 Atherosclerotic heart disease of native coronary artery without angina pectoris: Secondary | ICD-10-CM | POA: Insufficient documentation

## 2019-04-06 DIAGNOSIS — R0600 Dyspnea, unspecified: Secondary | ICD-10-CM

## 2019-04-06 DIAGNOSIS — J849 Interstitial pulmonary disease, unspecified: Secondary | ICD-10-CM | POA: Diagnosis not present

## 2019-04-06 DIAGNOSIS — Z6834 Body mass index (BMI) 34.0-34.9, adult: Secondary | ICD-10-CM | POA: Diagnosis not present

## 2019-04-06 DIAGNOSIS — M069 Rheumatoid arthritis, unspecified: Secondary | ICD-10-CM | POA: Diagnosis not present

## 2019-04-06 DIAGNOSIS — R079 Chest pain, unspecified: Secondary | ICD-10-CM | POA: Diagnosis not present

## 2019-04-06 DIAGNOSIS — Z7952 Long term (current) use of systemic steroids: Secondary | ICD-10-CM | POA: Diagnosis not present

## 2019-04-06 DIAGNOSIS — Z7982 Long term (current) use of aspirin: Secondary | ICD-10-CM | POA: Insufficient documentation

## 2019-04-06 DIAGNOSIS — Z8616 Personal history of COVID-19: Secondary | ICD-10-CM | POA: Insufficient documentation

## 2019-04-06 DIAGNOSIS — Z87891 Personal history of nicotine dependence: Secondary | ICD-10-CM | POA: Insufficient documentation

## 2019-04-06 DIAGNOSIS — Z79899 Other long term (current) drug therapy: Secondary | ICD-10-CM | POA: Diagnosis not present

## 2019-04-06 DIAGNOSIS — Z888 Allergy status to other drugs, medicaments and biological substances status: Secondary | ICD-10-CM | POA: Diagnosis not present

## 2019-04-06 DIAGNOSIS — I25119 Atherosclerotic heart disease of native coronary artery with unspecified angina pectoris: Secondary | ICD-10-CM | POA: Diagnosis present

## 2019-04-06 DIAGNOSIS — Z7902 Long term (current) use of antithrombotics/antiplatelets: Secondary | ICD-10-CM | POA: Diagnosis not present

## 2019-04-06 DIAGNOSIS — F329 Major depressive disorder, single episode, unspecified: Secondary | ICD-10-CM | POA: Diagnosis not present

## 2019-04-06 DIAGNOSIS — F419 Anxiety disorder, unspecified: Secondary | ICD-10-CM | POA: Diagnosis not present

## 2019-04-06 DIAGNOSIS — I200 Unstable angina: Secondary | ICD-10-CM

## 2019-04-06 DIAGNOSIS — G4730 Sleep apnea, unspecified: Secondary | ICD-10-CM | POA: Insufficient documentation

## 2019-04-06 DIAGNOSIS — K219 Gastro-esophageal reflux disease without esophagitis: Secondary | ICD-10-CM | POA: Diagnosis not present

## 2019-04-06 DIAGNOSIS — E669 Obesity, unspecified: Secondary | ICD-10-CM | POA: Diagnosis present

## 2019-04-06 DIAGNOSIS — I10 Essential (primary) hypertension: Secondary | ICD-10-CM | POA: Diagnosis present

## 2019-04-06 DIAGNOSIS — I252 Old myocardial infarction: Secondary | ICD-10-CM | POA: Insufficient documentation

## 2019-04-06 HISTORY — PX: RIGHT/LEFT HEART CATH AND CORONARY ANGIOGRAPHY: CATH118266

## 2019-04-06 LAB — POCT I-STAT 7, (LYTES, BLD GAS, ICA,H+H)
Acid-base deficit: 1 mmol/L (ref 0.0–2.0)
Bicarbonate: 24.7 mmol/L (ref 20.0–28.0)
Calcium, Ion: 1.23 mmol/L (ref 1.15–1.40)
HCT: 43 % (ref 39.0–52.0)
Hemoglobin: 14.6 g/dL (ref 13.0–17.0)
O2 Saturation: 99 %
Potassium: 3.9 mmol/L (ref 3.5–5.1)
Sodium: 142 mmol/L (ref 135–145)
TCO2: 26 mmol/L (ref 22–32)
pCO2 arterial: 41.7 mmHg (ref 32.0–48.0)
pH, Arterial: 7.381 (ref 7.350–7.450)
pO2, Arterial: 150 mmHg — ABNORMAL HIGH (ref 83.0–108.0)

## 2019-04-06 LAB — POCT I-STAT EG7
Bicarbonate: 26.1 mmol/L (ref 20.0–28.0)
Calcium, Ion: 1.25 mmol/L (ref 1.15–1.40)
HCT: 42 % (ref 39.0–52.0)
Hemoglobin: 14.3 g/dL (ref 13.0–17.0)
O2 Saturation: 77 %
Potassium: 3.9 mmol/L (ref 3.5–5.1)
Sodium: 142 mmol/L (ref 135–145)
TCO2: 28 mmol/L (ref 22–32)
pCO2, Ven: 46.1 mmHg (ref 44.0–60.0)
pH, Ven: 7.361 (ref 7.250–7.430)
pO2, Ven: 44 mmHg (ref 32.0–45.0)

## 2019-04-06 SURGERY — RIGHT/LEFT HEART CATH AND CORONARY ANGIOGRAPHY
Anesthesia: LOCAL

## 2019-04-06 MED ORDER — LIDOCAINE HCL (PF) 1 % IJ SOLN
INTRAMUSCULAR | Status: DC | PRN
  Administered 2019-04-06 (×2): 2 mL

## 2019-04-06 MED ORDER — VERAPAMIL HCL 2.5 MG/ML IV SOLN
INTRAVENOUS | Status: AC
  Filled 2019-04-06: qty 2

## 2019-04-06 MED ORDER — SODIUM CHLORIDE 0.9 % IV SOLN: 250.0000 mL | INTRAVENOUS | Status: DC | PRN

## 2019-04-06 MED ORDER — IOHEXOL 350 MG/ML SOLN
INTRAVENOUS | Status: DC | PRN
  Administered 2019-04-06: 45 mL

## 2019-04-06 MED ORDER — SODIUM CHLORIDE 0.9% FLUSH: 3.0000 mL | INTRAVENOUS | Status: DC | PRN

## 2019-04-06 MED ORDER — ACETAMINOPHEN 325 MG PO TABS: 650.0000 mg | ORAL_TABLET | ORAL | Status: DC | PRN

## 2019-04-06 MED ORDER — FENTANYL CITRATE (PF) 100 MCG/2ML IJ SOLN
INTRAMUSCULAR | Status: AC
  Filled 2019-04-06: qty 2

## 2019-04-06 MED ORDER — SODIUM CHLORIDE 0.9 % WEIGHT BASED INFUSION: 1.0000 mL/kg/h | INTRAVENOUS | Status: DC

## 2019-04-06 MED ORDER — FENTANYL CITRATE (PF) 100 MCG/2ML IJ SOLN
INTRAMUSCULAR | Status: DC | PRN
  Administered 2019-04-06 (×2): 25 ug via INTRAVENOUS

## 2019-04-06 MED ORDER — VERAPAMIL HCL 2.5 MG/ML IV SOLN
INTRAVENOUS | Status: DC | PRN
  Administered 2019-04-06: 10 mL via INTRA_ARTERIAL

## 2019-04-06 MED ORDER — LIDOCAINE HCL (PF) 1 % IJ SOLN
INTRAMUSCULAR | Status: AC
  Filled 2019-04-06: qty 30

## 2019-04-06 MED ORDER — ONDANSETRON HCL 4 MG/2ML IJ SOLN: 4.0000 mg | Freq: Four times a day (QID) | INTRAMUSCULAR | Status: DC | PRN

## 2019-04-06 MED ORDER — MIDAZOLAM HCL 2 MG/2ML IJ SOLN
INTRAMUSCULAR | Status: DC | PRN
  Administered 2019-04-06 (×2): 1 mg via INTRAVENOUS

## 2019-04-06 MED ORDER — HEPARIN (PORCINE) IN NACL 1000-0.9 UT/500ML-% IV SOLN
INTRAVENOUS | Status: DC | PRN
  Administered 2019-04-06 (×2): 500 mL

## 2019-04-06 MED ORDER — SODIUM CHLORIDE 0.9% FLUSH: 3.0000 mL | Freq: Two times a day (BID) | INTRAVENOUS | Status: DC

## 2019-04-06 MED ORDER — SODIUM CHLORIDE 0.9 % WEIGHT BASED INFUSION
3.0000 mL/kg/h | INTRAVENOUS | Status: AC
  Administered 2019-04-06: 3 mL/kg/h via INTRAVENOUS

## 2019-04-06 MED ORDER — HEPARIN SODIUM (PORCINE) 1000 UNIT/ML IJ SOLN
INTRAMUSCULAR | Status: AC
  Filled 2019-04-06: qty 1

## 2019-04-06 MED ORDER — SODIUM CHLORIDE 0.9 % WEIGHT BASED INFUSION
1.0000 mL/kg/h | INTRAVENOUS | Status: DC
  Administered 2019-04-06: 1 mL/kg/h via INTRAVENOUS

## 2019-04-06 MED ORDER — HEPARIN (PORCINE) IN NACL 1000-0.9 UT/500ML-% IV SOLN
INTRAVENOUS | Status: AC
  Filled 2019-04-06: qty 1000

## 2019-04-06 MED ORDER — HEPARIN SODIUM (PORCINE) 1000 UNIT/ML IJ SOLN
INTRAMUSCULAR | Status: DC | PRN
  Administered 2019-04-06: 6000 [IU] via INTRAVENOUS

## 2019-04-06 MED ORDER — MIDAZOLAM HCL 2 MG/2ML IJ SOLN
INTRAMUSCULAR | Status: AC
  Filled 2019-04-06: qty 2

## 2019-04-06 SURGICAL SUPPLY — 13 items
CATH BALLN WEDGE 5F 110CM (CATHETERS) ×1 IMPLANT
CATH INFINITI 5FR MULTPACK ANG (CATHETERS) ×1 IMPLANT
DEVICE RAD COMP TR BAND LRG (VASCULAR PRODUCTS) ×1 IMPLANT
GLIDESHEATH SLEND SS 6F .021 (SHEATH) ×1 IMPLANT
GUIDEWIRE .025 260CM (WIRE) ×1 IMPLANT
GUIDEWIRE INQWIRE 1.5J.035X260 (WIRE) IMPLANT
INQWIRE 1.5J .035X260CM (WIRE) ×2
KIT HEART LEFT (KITS) ×2 IMPLANT
PACK CARDIAC CATHETERIZATION (CUSTOM PROCEDURE TRAY) ×2 IMPLANT
SHEATH GLIDE SLENDER 4/5FR (SHEATH) ×1 IMPLANT
SHEATH PROBE COVER 6X72 (BAG) ×1 IMPLANT
TRANSDUCER W/STOPCOCK (MISCELLANEOUS) ×2 IMPLANT
TUBING CIL FLEX 10 FLL-RA (TUBING) ×2 IMPLANT

## 2019-04-06 NOTE — Interval H&P Note (Signed)
History and Physical Interval Note:  04/06/2019 7:06 AM  Richard Simpson  has presented today for surgery, with the diagnosis of chest pain - cad.  The various methods of treatment have been discussed with the patient and family. After consideration of risks, benefits and other options for treatment, the patient has consented to  Procedure(s): RIGHT/LEFT HEART CATH AND CORONARY ANGIOGRAPHY (N/A) as a surgical intervention.  The patient's history has been reviewed, patient examined, no change in status, stable for surgery.  I have reviewed the patient's chart and labs.  Questions were answered to the patient's satisfaction.   Cath Lab Visit (complete for each Cath Lab visit)  Clinical Evaluation Leading to the Procedure:   ACS: Yes.    Non-ACS:    Anginal Classification: CCS IV  Anti-ischemic medical therapy: Maximal Therapy (2 or more classes of medications)  Non-Invasive Test Results: No non-invasive testing performed  Prior CABG: No previous CABG        [facility] 04/06/2019 7:06 AM

## 2019-04-06 NOTE — Discharge Instructions (Signed)
Radial Site Care  This sheet gives you information about how to care for yourself after your procedure. Your health care provider may also give you more specific instructions. If you have problems or questions, contact your health care provider. What can I expect after the procedure? After the procedure, it is common to have:  Bruising and tenderness at the catheter insertion area. Follow these instructions at home: Medicines  Take over-the-counter and prescription medicines only as told by your health care provider. Insertion site care  Follow instructions from your health care provider about how to take care of your insertion site. Make sure you: ? Wash your hands with soap and water before you change your bandage (dressing). If soap and water are not available, use hand sanitizer. ? Change your dressing as told by your health care provider. ? Leave stitches (sutures), skin glue, or adhesive strips in place. These skin closures may need to stay in place for 2 weeks or longer. If adhesive strip edges start to loosen and curl up, you may trim the loose edges. Do not remove adhesive strips completely unless your health care provider tells you to do that.  Check your insertion site every day for signs of infection. Check for: ? Redness, swelling, or pain. ? Fluid or blood. ? Pus or a bad smell. ? Warmth.  Do not take baths, swim, or use a hot tub until your health care provider approves.  You may shower 24-48 hours after the procedure, or as directed by your health care provider. ? Remove the dressing and gently wash the site with plain soap and water. ? Pat the area dry with a clean towel. ? Do not rub the site. That could cause bleeding.  Do not apply powder or lotion to the site. Activity   For 24 hours after the procedure, or as directed by your health care provider: ? Do not flex or bend the affected arm. ? Do not push or pull heavy objects with the affected arm. ? Do not  drive yourself home from the hospital or clinic. You may drive 24 hours after the procedure unless your health care provider tells you not to. ? Do not operate machinery or power tools.  Do not lift anything that is heavier than 10 lb (4.5 kg), or the limit that you are told, until your health care provider says that it is safe.  Ask your health care provider when it is okay to: ? Return to work or school. ? Resume usual physical activities or sports. ? Resume sexual activity. General instructions  If the catheter site starts to bleed, raise your arm and put firm pressure on the site. If the bleeding does not stop, get help right away. This is a medical emergency.  If you went home on the same day as your procedure, a responsible adult should be with you for the first 24 hours after you arrive home.  Keep all follow-up visits as told by your health care provider. This is important. Contact a health care provider if:  You have a fever.  You have redness, swelling, or yellow drainage around your insertion site. Get help right away if:  You have unusual pain at the radial site.  The catheter insertion area swells very fast.  The insertion area is bleeding, and the bleeding does not stop when you hold steady pressure on the area.  Your arm or hand becomes pale, cool, tingly, or numb. These symptoms may represent a serious problem   that is an emergency. Do not wait to see if the symptoms will go away. Get medical help right away. Call your local emergency services (911 in the [place] Do not drive yourself to the hospital. Summary  After the procedure, it is common to have bruising and tenderness at the site.  Follow instructions from your health care provider about how to take care of your radial site wound. Check the wound every day for signs of infection.  Do not lift anything that is heavier than 10 lb (4.5 kg), or the limit that you are told, until your health care provider says  that it is safe. This information is not intended to replace advice given to you by your health care provider. Make sure you discuss any questions you have with your health care provider. Document Revised: 02/16/2017 Document Reviewed: 02/16/2017 [employer] Patient Education  2020 [employer]  

## 2019-04-07 NOTE — Progress Notes (Signed)
Thank you! ° °KL °

## 2019-04-17 ENCOUNTER — Encounter: Payer: Self-pay | Admitting: Cardiovascular Disease

## 2019-04-17 ENCOUNTER — Other Ambulatory Visit: Payer: Self-pay

## 2019-04-17 ENCOUNTER — Ambulatory Visit (INDEPENDENT_AMBULATORY_CARE_PROVIDER_SITE_OTHER): Payer: PPO | Admitting: Cardiovascular Disease

## 2019-04-17 VITALS — BP 138/78 | HR 68 | Temp 96.0°F | Ht 73.0 in | Wt 265.0 lb

## 2019-04-17 DIAGNOSIS — G4733 Obstructive sleep apnea (adult) (pediatric): Secondary | ICD-10-CM

## 2019-04-17 DIAGNOSIS — I5032 Chronic diastolic (congestive) heart failure: Secondary | ICD-10-CM | POA: Diagnosis not present

## 2019-04-17 DIAGNOSIS — E785 Hyperlipidemia, unspecified: Secondary | ICD-10-CM

## 2019-04-17 DIAGNOSIS — I10 Essential (primary) hypertension: Secondary | ICD-10-CM

## 2019-04-17 DIAGNOSIS — J849 Interstitial pulmonary disease, unspecified: Secondary | ICD-10-CM

## 2019-04-17 DIAGNOSIS — I2510 Atherosclerotic heart disease of native coronary artery without angina pectoris: Secondary | ICD-10-CM | POA: Diagnosis not present

## 2019-04-17 DIAGNOSIS — R072 Precordial pain: Secondary | ICD-10-CM

## 2019-04-17 MED ORDER — FUROSEMIDE 20 MG PO TABS: 20.0000 mg | ORAL_TABLET | Freq: Every day | ORAL | 3 refills | Status: DC

## 2019-04-17 NOTE — Progress Notes (Signed)
Cardiology Office Note    Date:  04/20/2019   ID:  Richard Simpson, DOB [birth year], MRN [id number]  PCP:  Caryl Bis, MD  Cardiologist:  [name], MD   No chief complaint on file.   History of Present Illness:  Richard Simpson is a 49 y.o. male I last evaluated in a telemedicine visit on February 23, 2019.  He presents for a 3-monthfollow-up evaluation.  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 He developed severe chest pain the evening of July 04, 2014 leading to his AForestine Nahospitalization on the morning of June 10. He was seen in consultation by Dr. BHarl Bowieand transferred for urgent cardiac catheterization which was done by me. This revealed a subtotal 99+ percent ostial LAD stenosis followed by proximal 70% and 50% stenoses. Initial ejection fraction was 45% with anterolateral hypocontractility. He underwent successful intervention with insertion of Synergy DES 3.520 mm stent placed from the LAD ostium to cover all 3 lesions. Subsequently, he noticed marked benefit in his previous exertional shortness of breath symptomatology. He was seen In follow-up by CMurray Hodgkins NP and due to and ACE-induced cough was started on losartan in place of lisinopril. He has a history of arthritic disease and in the past had been treated for probable psoriatic arthritis but he is now felt to have rheumatoid arthritis.  An echo Doppler study on 08/09/2014 which mild focal basal hypertrophy of the septum. There are no wall motion abnormalities. Ejection fraction was 60-65%. There was grade 1 diastolic dysfunction. The mitral valve was mildly calcified at its annulus and there was trivial aortic insufficiency. He participated in cardiac rehabilitation last year.  He developed recurrent episodes of chest tightness and underwent repeat cardiac catheterization on 03/24/2015 by Dr.  HEllyn Hackwhich showed a widely patent stent at the ostium and proximal LAD segment. There was mild 35% narrowing after the takeoff of the first diagonal vessel. His circumflex and RCA were considered to be normal. His LVEDP was mildly increased. It was felt that there was no new evidence for a new CAD lesion to explain his symptoms. It was felt that possibly his symptoms were related to microvascular disease versus diastolic dysfunction. Subsequently, he was started on Lasix 20 mg. He also has been taking amlodipine 5 mg, losartan 25 mg, metoprolol tartrate 25 mg twice a day. He continues to be on aspirin 81 mg and Brilinta 90 mg twice a day. His wife was very concerned about this 35% LAD stenosis.   When I saw him in March 2017, I titrated his losartan from 25 mg to 37.5 mg and recommended addition of Zetia to his 80 mg atorvastatin in attempt to induce further plaque regression. He underwent an echo Doppler study on 04/28/2015 which showed an ejection fraction at 60-65%. He did not have any regional wall motion abnormalities. He had mild left atrial dilatation. He was felt to have normal diastolic function. On subsequent evaluation he continued, he has continued to experience episodes of chest discomfort which he believes occurs with activity. Due to potential microvascular component to symptomatology I added nitrate therapy. I referred him for a cardiopulmonary met test which did not reveal significant cardiac or pulmonary limitation.   He was seen by RRosaria Ferries PAC, and because of increasing recurrent chest pain. He underwent repeat cardiac catheterization by me 11/20/2015. This did not demonstrate any residual CAD  and had a widely patent LAD stent extending from the ostium ending prior to the takeoff of the first diagonal vessel. There was mild 15% narrowing in the LAD after diagonal vessel which was improved from previous evaluation. His circumflex, intermediate, and dominant RCA  vessels were normal.   He was seen in June 2018 by Hughes Better seen by Almyra Deforest on 12/28/2016. His isosorbide was increased to 30 mg and he has noticed improvement and has not taken subungual nitroglycerin. He had developed shortness of breath with hunting. She referred him for an echo Doppler study on 01/06/2017, which showed an EF 50-55% with anteroapical, septal hypokinesis and grade 1 diastolic dysfunction.   I had not seen him since February 2017 saw him on January 13, 2017.He also has undergone pulmonary evaluation and was found to have restrictive physiology on PFTs. He has been followed by Dr. Elsworth Soho and a high resolution CT scan showed worsening of multifocal areas of groundglass attenuation most confluent in the left upper lobe but with progressive involvement in the lower lobes of the lungs bilaterally as well. 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 He is now off Remicade and is on CellCept.  When I last saw him in the office in April 2019 he was doing well and was closely followed by pulmonology for interstitial lung disease.  He also has rheumatoid arthritis.  He was doing well on Imdur, Norvasc, metoprolol and low-dose losartan.He continued to be on aspirin and Plavix. He notes significant improvement in his shortness of breath. He has lost over 40 pounds. He is breathing better. He denies recurrent anginal symptoms.   He was later seen in October 2019 by Fabian Sharp, [place] and remained stable and his last cardiology evaluation was with Almyra Deforest, PA in November 2020. Due to the COVID-19, he had gained some weight  However he denied any significant cardiac issue. He continued to have some chest discomfort with more strenuous activities such as hunting, however no chest discomfort with regular daily activity.  It was recommended that he continue his current cardiac  medications.  [name] was remaining stable from a cardiac standpoint however around [place] of this year his whole family as well as his son's girlfriends family all came down with Covid.  He states he never tested positive but he had similar symptomatology to all his family members and actually had the worst symptoms.  His oxygen decreased slightly to 90.  He was advised hospitalization but declined.  He was treated with steroids and Z-Pak after chest x-ray showed changes and he was found to have some interstitial edema.  He did not receive remdemsivir.  He received Lasix 80 mg for several days with improvement in his shortness of breath.  Prior to his most likely Covid episode, he states he was only taking Lasix as needed for some shortness of breath.  Since his symptoms he has required daily Lasix of at least 40 or 60 mg ever since.  He continues to experience residual fatigue.  He has obstructive sleep apnea and is continued to use CPAP therapy.  He will be undergoing a follow-up high resolution chest CT for his interstitial lung disease.  He denies any of his previous anginal symptoms.  He admits to weight gain and fatigability with exercise.  He has been trying to walk on a treadmill but often after 5 minutes he typically has  to stop.  Since his last telemedicine visit with me on February 23, 2019 he underwent an echo Doppler study on March 07, 2019 which showed an EF of 65 to 70%.  There was grade 1 diastolic dysfunction.  He was seen by Bunnie Domino, NP on April 02, 2019 with complaints of almost daily chest pain with minimal exertion.  He had been evaluated in the emergency room.  In light of recurrent symptomatology he was scheduled for an outpatient cardiac catheterization which was done on April 06, 2019 by Dr. Martinique and showed widely patent proximal LAD stent and proximal to mid 35% LAD stenosis.  Subsequently, he has felt relieved and has had improvement in his prior chest pain.  He  had been increasing his Lasix for fluid retention and also is undergoing evaluation for interstitial lung disease and is to have a high-resolution CT scan.  He presents for evaluation.   Past Medical History:  Diagnosis Date  . Anxiety   . CAD S/P percutaneous coronary angioplasty 10/2013   a. 10/2013 Cath: mod LAD dzs->nl FFR-> medically managed;  b. 06/2014 NSTEMI/PCi: LAD 99ost, 70p, 34m(entire area covered with a 3.5x20 Synergy DES), LCX nl, RCA 255m10d, EF 45-50%. c. 10/2015: cath showing patent LAD stent with 15% Prox LAD stenosis. EF 55-65%.   . 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 Depression   . Dyslipidemia, goal LDL below 70   . Essential hypertension   . GERD (gastroesophageal reflux disease)   . Headache   . History of kidney stones   . Interstitial lung disease ([facility]  . Ischemic cardiomyopathy - Resolved 06/2014   a. 06/2014 EF 45-50% by LV gram b. Echo July 2016: EF 60-65%. No RWMA. Gr 1 DD   . NSTEMI (non-ST elevated myocardial infarction) ([facility] [place]  . Obesity (BMI 30-39.9) 10/29/2013  . Primary snoring 10/29/2013  . Rheumatoid arthritis ([place]8/30/2015  . RLS (restless legs syndrome) 10/29/2013  . Sleep apnea     Past Surgical History:  Procedure Laterality Date  . BIOPSY  09/27/2018   Procedure: BIOPSY;  Surgeon: RoDaneil DolinMD;  [facility];  Service: Endoscopy;;  . CARDIAC CATHETERIZATION N/A 07/05/2014   Procedure: Left Heart Cath and Coronary Angiography;  Surgeon: ThTroy SineMD;  [facility];  Service: Cardiovascular;  NSTEMI --> Ostial LAd 99%, prox 80% & early mid 50% --> PCI  . CARDIAC CATHETERIZATION  07/05/2014   Procedure: Coronary Stent Intervention;  Surgeon: ThTroy SineMD;  [facility];  Service: Cardiovascular; Ostial-prox LAD tandem 99, 80 & 50% --> Synergy DES 3.5 x 20 (3.74)  . CARDIAC CATHETERIZATION N/A 03/24/2015    Procedure: Left Heart Cath and Coronary Angiography;  Surgeon: DaLeonie ManMD;  [facility];  Service: Cardiovascular;  Laterality: N/A;  . CARDIAC CATHETERIZATION N/A 11/20/2015   Procedure: Left Heart Cath and Coronary Angiography;  Surgeon: ThTroy SineMD;  [facility];  Service: Cardiovascular;  Laterality: N/A;  . CHOLECYSTECTOMY     10 yrs ago-jenkins  . COLONOSCOPY N/A 03/23/2013   Procedure: COLONOSCOPY;  Surgeon: RoDaneil DolinMD;  [facility];  Service: Endoscopy;  Laterality: N/A;  2:15-moved to 1030 Staff notified pt  . COLONOSCOPY WITH PROPOFOL N/A 06/10/2016   Procedure: COLONOSCOPY WITH PROPOFOL;  Surgeon: RoDaneil DolinMD;  [facility];  Service: Endoscopy;  Laterality: N/A;  730   . ESOPHAGOGASTRODUODENOSCOPY (EGD) WITH PROPOFOL N/A 09/27/2018   Procedure: ESOPHAGOGASTRODUODENOSCOPY (EGD) WITH PROPOFOL;  Surgeon: Daneil Dolin, MD;  [facility];  Service: Endoscopy;  Laterality: N/A;  1:30pm  . FRACTIONAL FLOW RESERVE WIRE  11/23/2013   Procedure: FRACTIONAL FLOW RESERVE WIRE;  Surgeon: Jettie Booze, MD;  Location: [facility] CATH LAB;  Service: Cardiovascular;;  . GALLBLADDER SURGERY    . LEFT HEART CATHETERIZATION WITH CORONARY ANGIOGRAM N/A 11/23/2013   Procedure: LEFT HEART CATHETERIZATION WITH CORONARY ANGIOGRAM;  Surgeon: Jettie Booze, MD;  Location: [facility] CATH LAB;  Service: Cardiovascular;  Laterality: N/A;  . MASS EXCISION  09/09/2010   Procedure: EXCISION MASS;  Surgeon: Arther Abbott, MD;  Location: [facility];  Service: Orthopedics;  Laterality: Left;  Excision Mass Left Long Finger  . POLYPECTOMY  06/10/2016   Procedure: POLYPECTOMY;  Surgeon: Daneil Dolin, MD;  [facility];  Service: Endoscopy;;  colon  . RIGHT/LEFT HEART CATH AND CORONARY ANGIOGRAPHY N/A 04/06/2019   Procedure: RIGHT/LEFT HEART CATH AND CORONARY ANGIOGRAPHY;  Surgeon: Martinique, Peter M, MD;  Location: [place] CV LAB;  Service: Cardiovascular;  Laterality: N/A;  . THROAT SURGERY     nodules removed from throat as child  . TRANSTHORACIC ECHOCARDIOGRAM   July 2016:    EF 60-65%. No RWMA. Gr 1 DD    Current Medications: Outpatient Medications Prior to Visit  Medication Sig Dispense Refill  . albuterol (PROAIR HFA) 108 (90 Base) MCG/ACT inhaler Inhale 2 puffs into the lungs every 6 (six) hours as needed for wheezing or shortness of breath. 8 g 0  . amLODipine (NORVASC) 2.5 MG tablet Take 1 tablet daily . OFFICE VISIT NEEDED (Patient taking differently: Take 2.5 mg by mouth daily. ) 30 tablet 2  . aspirin EC 81 MG tablet Take 1 tablet (81 mg total) by mouth daily. 30 tablet 3  . atorvastatin (LIPITOR) 80 MG tablet TAKE (1) TABLET BY MOUTH ONCE DAILY. (Patient taking differently: Take 80 mg by mouth at bedtime. ) 90 tablet 0  . clonazePAM (KLONOPIN) 1 MG tablet Take 1 mg by mouth 3 (three) times daily as needed for anxiety. Take 44m tabletby mouth scheduled at bedtime, may take an additional dose if needed for anxiety    . clopidogrel (PLAVIX) 75 MG tablet Take 1 tablet (75 mg total) by mouth daily. 90 tablet 3  . loratadine (CLARITIN) 10 MG tablet Take 10 mg by mouth daily.     .Marland Kitchenlosartan (COZAAR) 25 MG tablet Take 1 tablet (25 mg total) by mouth every morning. **Needs OV for future refills** (Patient taking differently: Take 25 mg by mouth daily. ) 30 tablet 1  . methylPREDNISolone (MEDROL) 4 MG tablet Take 4 mg by mouth daily.     . metoprolol tartrate (LOPRESSOR) 25 MG tablet TAKE (1) TABLET BY MOUTH TWICE DAILY. (Patient taking differently: Take 25 mg by mouth 2 (two) times daily. ) 60 tablet 7  . mycophenolate (CELLCEPT) 500 MG tablet Take 1,000 mg by mouth 2 (two) times daily.     . nitroGLYCERIN (NITROSTAT) 0.4 MG SL tablet DISSOLVE 1 TABLET UNDER TONGUE FOR CHEST TIGHTNESS AND SHORTNESS OF BREATH. MAY TAKE EVERY 5 MINUTES UP TO 3 DOSES. IF NO RELIEF AFTER 3 DOS (Patient taking differently:  Place 0.4 mg under the tongue every 5 (five) minutes x 3 doses as needed for chest pain. ) 25 tablet  0  . pantoprazole (PROTONIX) 40 MG tablet TAKE 1 TABLET BY MOUTH ONCE A DAY. (Patient taking differently: Take 40 mg by mouth daily. ) 30 tablet 5  . traMADol (ULTRAM) 50 MG tablet Take 50 mg by mouth every 6 (six) hours as needed for moderate pain.     . furosemide (LASIX) 40 MG tablet Take 1 tablet (40 mg total) by mouth daily. 30 tablet 3  . isosorbide mononitrate (IMDUR) 30 MG 24 hr tablet Take 0.5 tablets (15 mg total) by mouth daily. 45 tablet 3   No facility-administered medications prior to visit.     Allergies:   Lisinopril and Prednisone   Social History   Socioeconomic History  . Marital status: Married    Spouse name: [name]  . Number of children: 2  . Years of education: 12th grade  . Highest education level: Not on file  Occupational History  . Occupation: Retired Chemical engineer: [employer]  Tobacco Use  . Smoking status: Former Smoker    Packs/day: 0.25    Years: 15.00    Pack years: 3.75    Types: Cigarettes    Quit date: 09/04/2003    Years since quitting: 15.6  . Smokeless tobacco: Current User    Types: Chew  Substance and Sexual Activity  . Alcohol use: Yes    Alcohol/week: 1.0 - 2.0 standard drinks    Types: 1 - 2 Cans of beer per week    Comment: rare  . Drug use: No  . Sexual activity: Yes  Other Topics Concern  . Not on file  Social History Narrative   Patient lives at home with his wife [name]).    Patient works full time Dentist..   Education high school.   Right handed.   Caffeine one cup of coffee daily and one soda.   Social Determinants of Health   Financial Resource Strain:   . Difficulty of Paying Living Expenses:   Food Insecurity:   . Worried About Charity fundraiser in the Last Year:   . Arboriculturist in the Last Year:   Transportation Needs:   . Film/video editor (Medical):   Marland Kitchen Lack  of Transportation (Non-Medical):   Physical Activity:   . Days of Exercise per Week:   . Minutes of Exercise per Session:   Stress:   . Feeling of Stress :   Social Connections:   . Frequency of Communication with Friends and Family:   . Frequency of Social Gatherings with Friends and Family:   . Attends Religious Services:   . Active Member of Clubs or Organizations:   . Attends Archivist Meetings:   Marland Kitchen Marital Status:      Family History:  The patient's family history includes Arthritis in his paternal uncle; Cancer in his maternal grandfather; Diabetes in his maternal grandmother, mother, paternal grandfather, and other family members; Hypertension in his maternal grandmother and mother.   ROS General: Negative; No fevers, chills, or night sweats;  HEENT: Negative; No changes in vision or hearing, sinus congestion, difficulty swallowing Pulmonary: Positive for shortness of breath; history of Covid infection, interstitial lung disease Cardiovascular: See HPI GI: Negative; No nausea, vomiting, diarrhea, or abdominal pain GU: Negative; No dysuria, hematuria, or difficulty voiding Musculoskeletal: Negative; no myalgias, joint pain, or weakness Hematologic/Oncology: Negative; no easy bruising, bleeding Endocrine: Negative; no heat/cold intolerance; no diabetes Neuro: Negative; no changes in balance, headaches Skin: Negative;  No rashes or skin lesions Psychiatric: Negative; No behavioral problems, depression Sleep: Positive for OSA on CPAP followed by  pulmonary Other comprehensive 14 point system review is negative.   PHYSICAL EXAM:   VS:  BP 138/78   Pulse 68   Temp (!) 96 F (35.6 C)   Ht '6\' 1"'  (1.854 m)   Wt 265 lb (120.2 kg)   SpO2 98%   BMI 34.96 kg/m     Repeat blood pressure by me 104/60  Wt Readings from Last 3 Encounters:  04/17/19 265 lb (120.2 kg)  04/06/19 260 lb (117.9 kg)  04/02/19 267 lb 3.2 oz (121.2 kg)    General: Alert, oriented, no  distress.  Skin: normal turgor, no rashes, warm and dry HEENT: Normocephalic, atraumatic. Pupils equal round and reactive to light; sclera anicteric; extraocular muscles intact; Nose without nasal septal hypertrophy Mouth/Parynx benign; Mallinpatti scale 3 Neck: No JVD, no carotid bruits; normal carotid upstroke Lungs: clear to ausculatation and percussion; no wheezing or rales Chest wall: without tenderness to palpitation Heart: PMI not displaced, RRR, s1 s2 normal, 1/6 systolic murmur, no diastolic murmur, no rubs, gallops, thrills, or heaves Abdomen: soft, nontender; no hepatosplenomehaly, BS+; abdominal aorta nontender and not dilated by palpation. Back: no CVA tenderness Pulses 2+ Musculoskeletal: full range of motion, normal strength, no joint deformities Extremities: no clubbing cyanosis or edema, Homan's sign negative  Neurologic: grossly nonfocal; Cranial nerves grossly wnl Psychologic: Normal mood and affect   Studies/Labs Reviewed:   EKG:  EKG is ordered today.  ECG (independently read by me): NSR at 68; no ectopy.  Early transition normal intervals  December 15, 2018 ECG (independently read by me): Normal sinus rhythm at 66 bpm, no ectopy.  Normal intervals.  Recent Labs: BMP Latest Ref Rng & Units 04/06/2019 04/06/2019 04/02/2019  Glucose 70 - 99 mg/dL - - 110(H)  BUN 6 - 20 mg/dL - - 24(H)  Creatinine 0.61 - 1.24 mg/dL - - 1.07  BUN/Creat Ratio 9 - 20 - - -  Sodium 135 - 145 mmol/L 142 142 138  Potassium 3.5 - 5.1 mmol/L 3.9 3.9 3.7  Chloride 98 - 111 mmol/L - - 102  CO2 22 - 32 mmol/L - - 27  Calcium 8.9 - 10.3 mg/dL - - 8.9     Hepatic Function Latest Ref Rng & Units 03/07/2019 08/31/2018 04/16/2015  Total Protein 6.0 - 8.5 g/dL 6.4 6.6 7.0  Albumin 4.0 - 5.0 g/dL 4.3 - 4.2  AST 0 - 40 IU/L '24 18 31  ' ALT 0 - 44 IU/L 37 25 40  Alk Phosphatase 39 - 117 IU/L 67 - 69  Total Bilirubin 0.0 - 1.2 mg/dL 0.5 1.0 1.2  Bilirubin, Direct 0.0 - 0.2 mg/dL - 0.2 -    CBC  Latest Ref Rng & Units 04/06/2019 04/06/2019 04/02/2019  WBC 4.0 - 10.5 K/uL - - 7.1  Hemoglobin 13.0 - 17.0 g/dL 14.6 14.3 16.6  Hematocrit 39.0 - 52.0 % 43.0 42.0 49.4  Platelets 150 - 400 K/uL - - 194   Lab Results  Component Value Date   MCV 98.2 04/02/2019   MCV 92 03/07/2019   MCV 95.5 10/23/2017   Lab Results  Component Value Date   TSH 1.890 03/07/2019   No results found for: HGBA1C   BNP    Component Value Date/Time   BNP 23.0 04/02/2019 0007    ProBNP    Component Value Date/Time   PROBNP 18.0 03/19/2015 1034  Lipid Panel     Component Value Date/Time   CHOL 129 03/07/2019 0901   TRIG 87 03/07/2019 0901   HDL 39 (L) 03/07/2019 0901   CHOLHDL 3.3 03/07/2019 0901   CHOLHDL 3.7 04/16/2015 0820   VLDL 14 04/16/2015 0820   LDLCALC 73 03/07/2019 0901   LABVLDL 17 03/07/2019 0901     RADIOLOGY: CARDIAC CATHETERIZATION  Result Date: 04/06/2019  Previously placed Ost LAD to Prox LAD stent (unknown type) is widely patent.  Prox LAD to Mid LAD lesion is 35% stenosed.  LV end diastolic pressure is normal.  LV end diastolic pressure is normal.  1. Nonobstructive CAD. Prior stent in the proximal LAD is widely patent 2. Normal LV filling pressures 3. Normal right heart pressures 4. Normal cardiac output Plan: based on these findings I suspect his symptoms are more related to his pulmonary disease. Continue medical management.   DG Chest Portable 1 View  Result Date: 04/02/2019 CLINICAL DATA:  Shortness of breath and chest pain. EXAM: PORTABLE CHEST 1 VIEW COMPARISON:  February 06, 2019 FINDINGS: The lung volumes are low. There is atelectasis versus scarring at the lung bases. There is no pneumothorax. No large pleural effusion. The heart size is stable. There is no acute osseous abnormality. IMPRESSION: No active disease. Electronically Signed   By: Constance Holster M.D.   On: 04/02/2019 00:49     Additional studies/ records that were reviewed today include:    Echo 03/07/2019 IMPRESSIONS  1. Left ventricular ejection fraction, by estimation, is 65 to 70%. The  left ventricle has normal function. The left ventrical is not well  visualized to evaluate regional wall motion. There is mildly increased  left ventricular hypertrophy. Left  ventricular diastolic parameters are consistent with Grade I diastolic  dysfunction (impaired relaxation.)  2. Right ventricular systolic function is normal. The right ventricular  size is normal. There is normal pulmonary artery systolic pressure.  3. The mitral valve is abnormal. trivial mitral valve regurgitation. No  evidence of mitral stenosis.  4. The aortic valve is tricuspid. Aortic valve regurgitation is trivial .  No aortic stenosis is present.  5. The inferior vena cava is normal in size with greater than 50%  respiratory variability, suggesting right atrial pressure of 3 mmHg.   Comparison(s): A prior study was performed on 01/06/2017. Prior images  unable to be directly viewed, comparison made by report only. Comparison  of reports suggests improved LV function. Unable to assess regional wall  motion, however LVEF appears  hyperdynamic today.    ASSESSMENT:    1. Coronary artery disease involving native coronary artery of native heart without angina pectoris   2. Precordial pain   3. Essential hypertension   4. Hyperlipidemia LDL goal <70   5. ILD (interstitial lung disease) ([place])   6. OSA (obstructive sleep apnea)   7. Chronic diastolic heart failure (HCC)      PLAN:  1.  CAD: Mr. Hubers suffered a non-ST segment elevation MI on July 05, 2014 secondary to 99% ostial LAD stenosis.  He underwent successful DES stenting with a 3.5 x 20 mm Synergy stent which covered 3 LAD lesions.  Repeat catheterization in February 2017 due to recurrent chest pain revealed widely patent stent with mild 35% nonobstructive LAD disease.  He had recurrent chest pain symptomatology earlier this year and  underwent most recent catheterization on April 06, 2019 which essentially was unchanged from his prior study.  LAD stent remains widely patent.  2.  Exertional dyspnea: Probably contributed by diastolic dysfunction as well as his interstitial lung disease.  He had a Covid infection presumed in early January.  Follow-up echo Doppler study continues to show normal LV function with EF hyperdynamic at 65 to 70% and grade 1 diastolic dysfunction.  3.  Interstitial lung disease: Currently under evaluation by pulmonary department.  Plan is to have future PFTs as well as high-resolution CT  4.  Essential hypertension: Blood pressure is low today on his regimen consisting of amlodipine 2.5 mg, furosemide 40 mg daily, losartan 25 mg and metoprolol tartrate 12.5 mg twice a day.  I have recommended he reduce his furosemide to 20 mg and also will discontinue isosorbide which had been started with his recurrent chest pain symptomatology.  5.  Hyperlipidemia: Target LDL less than 70 in this patient with CAD, currently on atorvastatin 80 mg, post recent LDL cholesterol February 2021 at 67.  6.  OSA: On CPAP with 100% compliance  7.  Obesity: BMI 35 and weight loss and exercise recommended.  Discussed Mediterranean diet.   Medication Adjustments/Labs and Tests Ordered: Current medicines are reviewed at length with the patient today.  Concerns regarding medicines are outlined above.  Medication changes, Labs and Tests ordered today are listed in the Patient Instructions below. Patient Instructions  Medication Instructions:  DECREASE LASIX TO 20 MG DAILY  STOP TAKING ISOSORBIDE  *If you need a refill on your cardiac medications before your next appointment, please call your pharmacy*    Follow-Up: At [facility], you and your health needs are our priority.  As part of our continuing mission to provide you with exceptional heart care, we have created designated Provider Care Teams.  These Care Teams  include your primary Cardiologist (physician) and Advanced Practice Providers (APPs -  Physician Assistants and Nurse Practitioners) who all work together to provide you with the care you need, when you need it.  We recommend signing up for the patient portal called "MyChart".  Sign up information is provided on this After Visit Summary.  MyChart is used to connect with patients for Virtual Visits (Telemedicine).  Patients are able to view lab/test results, encounter notes, upcoming appointments, etc.  Non-urgent messages can be sent to your provider as well.   To learn more about what you can do with MyChart, go to [email or web address].    Your next appointment:   6 month(s)  The format for your next appointment:   In Person  Provider:   [name], MD       Signed, [name], MD  04/20/2019 3:00 PM    [facility] [address] [place], [place], [place]  [address] Phone: [phone]

## 2019-04-17 NOTE — Patient Instructions (Signed)
Medication Instructions:  DECREASE LASIX TO 20 MG DAILY  STOP TAKING ISOSORBIDE  *If you need a refill on your cardiac medications before your next appointment, please call your pharmacy*    Follow-Up: At [facility], you and your health needs are our priority.  As part of our continuing mission to provide you with exceptional heart care, we have created designated Provider Care Teams.  These Care Teams include your primary Cardiologist (physician) and Advanced Practice Providers (APPs -  Physician Assistants and Nurse Practitioners) who all work together to provide you with the care you need, when you need it.  We recommend signing up for the patient portal called "MyChart".  Sign up information is provided on this After Visit Summary.  MyChart is used to connect with patients for Virtual Visits (Telemedicine).  Patients are able to view lab/test results, encounter notes, upcoming appointments, etc.  Non-urgent messages can be sent to your provider as well.   To learn more about what you can do with MyChart, go to [email or web address].    Your next appointment:   6 month(s)  The format for your next appointment:   In Person  Provider:   [name], MD

## 2019-04-20 ENCOUNTER — Encounter: Payer: Self-pay | Admitting: Cardiovascular Disease

## 2019-04-25 DIAGNOSIS — I10 Essential (primary) hypertension: Secondary | ICD-10-CM | POA: Diagnosis not present

## 2019-04-25 DIAGNOSIS — E7849 Other hyperlipidemia: Secondary | ICD-10-CM | POA: Diagnosis not present

## 2019-04-25 NOTE — Telephone Encounter (Signed)
Pt had follow up with Jory Sims NP on 3/8 and follow up with Dr.Kelly on 3/23

## 2019-05-21 ENCOUNTER — Other Ambulatory Visit: Payer: Self-pay | Admitting: Cardiovascular Disease

## 2019-05-24 NOTE — Telephone Encounter (Signed)
*  STAT* If patient is at the pharmacy, call can be transferred to refill team.    1. Which medications need to be refilled? (please list name of each medication and dose if known)  Losartan Potassium 25 MG   2. Which pharmacy/location (including street and city if local pharmacy) is medication to be sent to?[facility]  3. Do they need a 30 day or 90 day supply? 90 day  Patient is out of medication

## 2019-06-12 ENCOUNTER — Other Ambulatory Visit: Payer: Self-pay | Admitting: Cardiovascular Disease

## 2019-06-13 DIAGNOSIS — Z0001 Encounter for general adult medical examination with abnormal findings: Secondary | ICD-10-CM | POA: Diagnosis not present

## 2019-06-18 ENCOUNTER — Ambulatory Visit (HOSPITAL_COMMUNITY)
Admission: RE | Admit: 2019-06-18 | Discharge: 2019-06-18 | Disposition: A | Discharge status: Home / Self Care | Payer: PPO | Source: Ambulatory Visit | Attending: Primary Care | Admitting: Primary Care

## 2019-06-18 ENCOUNTER — Other Ambulatory Visit (HOSPITAL_COMMUNITY): Payer: PPO

## 2019-06-18 ENCOUNTER — Other Ambulatory Visit: Payer: Self-pay

## 2019-06-18 ENCOUNTER — Other Ambulatory Visit (HOSPITAL_COMMUNITY)
Admission: RE | Admit: 2019-06-18 | Discharge: 2019-06-18 | Disposition: A | Discharge status: Home / Self Care | Payer: PPO | Source: Ambulatory Visit | Attending: Primary Care | Admitting: Primary Care

## 2019-06-18 DIAGNOSIS — R0602 Shortness of breath: Secondary | ICD-10-CM | POA: Diagnosis not present

## 2019-06-18 DIAGNOSIS — I2510 Atherosclerotic heart disease of native coronary artery without angina pectoris: Secondary | ICD-10-CM | POA: Insufficient documentation

## 2019-06-18 DIAGNOSIS — J849 Interstitial pulmonary disease, unspecified: Secondary | ICD-10-CM | POA: Diagnosis not present

## 2019-06-18 DIAGNOSIS — Z20822 Contact with and (suspected) exposure to covid-19: Secondary | ICD-10-CM | POA: Diagnosis not present

## 2019-06-18 DIAGNOSIS — Z8616 Personal history of COVID-19: Secondary | ICD-10-CM | POA: Diagnosis not present

## 2019-06-18 DIAGNOSIS — Z01812 Encounter for preprocedural laboratory examination: Secondary | ICD-10-CM | POA: Insufficient documentation

## 2019-06-18 LAB — SARS CORONAVIRUS 2 (TAT 6-24 HRS): SARS Coronavirus 2: NEGATIVE

## 2019-06-20 ENCOUNTER — Ambulatory Visit: Payer: PPO | Admitting: Primary Care

## 2019-06-20 DIAGNOSIS — E782 Mixed hyperlipidemia: Secondary | ICD-10-CM | POA: Diagnosis not present

## 2019-06-20 DIAGNOSIS — I2510 Atherosclerotic heart disease of native coronary artery without angina pectoris: Secondary | ICD-10-CM | POA: Diagnosis not present

## 2019-06-20 DIAGNOSIS — K219 Gastro-esophageal reflux disease without esophagitis: Secondary | ICD-10-CM | POA: Diagnosis not present

## 2019-06-20 DIAGNOSIS — R7301 Impaired fasting glucose: Secondary | ICD-10-CM | POA: Diagnosis not present

## 2019-06-20 DIAGNOSIS — M05741 Rheumatoid arthritis with rheumatoid factor of right hand without organ or systems involvement: Secondary | ICD-10-CM | POA: Diagnosis not present

## 2019-06-20 DIAGNOSIS — R4582 Worries: Secondary | ICD-10-CM | POA: Diagnosis not present

## 2019-06-20 DIAGNOSIS — Z6834 Body mass index (BMI) 34.0-34.9, adult: Secondary | ICD-10-CM | POA: Diagnosis not present

## 2019-06-20 DIAGNOSIS — I10 Essential (primary) hypertension: Secondary | ICD-10-CM | POA: Diagnosis not present

## 2019-06-21 ENCOUNTER — Ambulatory Visit: Payer: PPO | Admitting: Primary Care

## 2019-06-21 ENCOUNTER — Ambulatory Visit (INDEPENDENT_AMBULATORY_CARE_PROVIDER_SITE_OTHER): Payer: PPO | Admitting: Pulmonary Disease

## 2019-06-21 ENCOUNTER — Other Ambulatory Visit: Payer: Self-pay

## 2019-06-21 ENCOUNTER — Encounter: Payer: Self-pay | Admitting: Primary Care

## 2019-06-21 VITALS — BP 100/60 | HR 56 | Temp 98.0°F | Ht 73.0 in | Wt 257.0 lb

## 2019-06-21 DIAGNOSIS — J849 Interstitial pulmonary disease, unspecified: Secondary | ICD-10-CM | POA: Diagnosis not present

## 2019-06-21 LAB — PULMONARY FUNCTION TEST
DL/VA % pred: 114 %
DL/VA: 5.1 ml/min/mmHg/L
DLCO cor % pred: 99 %
DLCO cor: 32.62 ml/min/mmHg
DLCO unc % pred: 99 %
DLCO unc: 32.62 ml/min/mmHg
FEF 25-75 Post: 5.3 L/sec
FEF 25-75 Pre: 4.73 L/sec
FEF2575-%Change-Post: 12 %
FEF2575-%Pred-Post: 135 %
FEF2575-%Pred-Pre: 120 %
FEV1-%Change-Post: 4 %
FEV1-%Pred-Post: 88 %
FEV1-%Pred-Pre: 85 %
FEV1-Post: 3.91 L
FEV1-Pre: 3.75 L
FEV1FVC-%Change-Post: 1 %
FEV1FVC-%Pred-Pre: 110 %
FEV6-%Change-Post: 2 %
FEV6-%Pred-Post: 80 %
FEV6-%Pred-Pre: 78 %
FEV6-Post: 4.43 L
FEV6-Pre: 4.32 L
FEV6FVC-%Pred-Post: 103 %
FEV6FVC-%Pred-Pre: 103 %
FVC-%Change-Post: 2 %
FVC-%Pred-Post: 78 %
FVC-%Pred-Pre: 76 %
FVC-Post: 4.44 L
FVC-Pre: 4.32 L
Post FEV1/FVC ratio: 88 %
Post FEV6/FVC ratio: 100 %
Pre FEV1/FVC ratio: 87 %
Pre FEV6/FVC Ratio: 100 %
RV % pred: 50 %
RV: 1.08 L
TLC % pred: 85 %
TLC: 6.49 L

## 2019-06-21 NOTE — Progress Notes (Signed)
Full PFT performed today. °

## 2019-06-21 NOTE — Progress Notes (Signed)
@Patient  ID: Richard Simpson, male    DOB: 1970/07/10, 49 y.o.   MRN: [id number]  Chief Complaint  Patient presents with  . Follow-up    PFT today-sob better    Referring provider: Caryl Bis, MD   HPI: 49 year old male, former smoker quit in 2005 (3.75 pack year hx). PMH significant for  OSA on CPAP, ILD d/t rheumatoid arthritis, NSTEMI in 2005 s/p PTCA to LAD, diastolic heart failure, HTN, GERD, obesity. Patient of Dr. Elsworth Soho. Presumed Covid-19 in January 2021. Maintained on cellcept 1,000mg  twice daily and methylprednisolone 4mg  daily for RA.   Previous [facility] pulmonary encounter: 04/02/2019 Patient present today for follow-up visit for ILD and OSA. Wife present by video. He is still having some trouble breathing. Associated intermittent chest tightness and dry cough. He was presumed to have covid-19 back in January despite negative test. Family all tested positive. Due HRCT May/June. Went to ED last night around 11pm for chest pain. He took 3 nitroglycerin with temporary improvement. He saw cardiology today, planning for cardiac cath this Friday. He remains on lasix 40mg  daily which he has been taking regularly since January. Reports compliance with CPAP every night. Continues cellcept and methylprednisolone for RA. Seeing rheumatology tomorrow. Denies wheezing or purulent mucus.   06/21/2019 Patient presents today for 2-3 month follow-up with PFTs. States that his breathing has been alot better over the last month. He has been exercising and working on losing weight.  He is down 10 pounds since last office visit. Reports that he has been a little more comfortable going out and going to working but still has to be cautious not to over due it. PFTs today showed stable lung function and diffusion capacity.  Denies acute respiratory symptoms.    Pulmonary testing:  > PFTs 10/2017 FVC 83%, DLCO 98% > PFTs 12/2013 >>no obs, FVC 77%, nml DLCO -Mild restriction?effect of obesity >  PFT12/2018moderate restriction .FEV1 70%, ratio 86, FVC 63%, DLCO 74%. > PFTs 06/21/2019- Post bronchodilator FVC 4.44 (78%), FEV1 3.91 (88%), ratio 88, DLCO 32.62 ( 99%)  Imaging: 06/18/19 HRCT - 1. There is minimal dependent bibasilar irregular and ground-glass opacity, significantly improved in comparison to CT dated 01/19/2017, and additionally with complete resolution of previously seen left upper lobe airspace opacity seen at that time. Findings are consistent with minimal nonspecific post infectious or inflammatory scarring without specific evidence of fibrotic interstitial lung disease and no new evidence of fibrosis. 2. No acute airspace disease. 3. No significant air trapping on expiratory phase imaging. 4. Coronary artery disease.  Allergies  Allergen Reactions  . Lisinopril Cough  . Prednisone Other (See Comments)    Makes angry    Immunization History  Administered Date(s) Administered  . Influenza Split 10/25/2016  . Influenza,inj,Quad PF,6+ Mos 11/20/2014, 11/04/2017  . Influenza,inj,Quad PF,6-35 Mos 10/26/2018  . Influenza-Unspecified 12/09/2013    Past Medical History:  Diagnosis Date  . Anxiety   . CAD S/P percutaneous coronary angioplasty 10/2013   a. 10/2013 Cath: mod LAD dzs->nl FFR-> medically managed;  b. 06/2014 NSTEMI/PCi: LAD 99ost, 70p, 39m (entire area covered with a 3.5x20 Synergy DES), LCX nl, RCA 34m, 10d, EF 45-50%. c. 10/2015: cath showing patent LAD stent with 15% Prox LAD stenosis. EF 55-65%.   . 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 Depression   . Dyslipidemia, goal LDL below 70   . Essential hypertension   .  GERD (gastroesophageal reflux disease)   . Headache   . History of kidney stones   . Interstitial lung disease ([place])   . Ischemic cardiomyopathy - Resolved 06/2014   a. 06/2014 EF 45-50% by LV gram b. Echo July 2016: EF 60-65%. No RWMA. Gr 1 DD   . NSTEMI  (non-ST elevated myocardial infarction) ([place]) 06/2014  . Obesity (BMI 30-39.9) 10/29/2013  . Primary snoring 10/29/2013  . Rheumatoid arthritis ([place]) 09/23/2013  . RLS (restless legs syndrome) 10/29/2013  . Sleep apnea     Tobacco History: Social History   Tobacco Use  Smoking Status Former Smoker  . Packs/day: 0.25  . Years: 15.00  . Pack years: 3.75  . Types: Cigarettes  . Quit date: 09/04/2003  . Years since quitting: 15.8  Smokeless Tobacco Current User  . Types: Chew   Ready to quit: Not Answered Counseling given: Not Answered   Outpatient Medications Prior to Visit  Medication Sig Dispense Refill  . albuterol (PROAIR HFA) 108 (90 Base) MCG/ACT inhaler Inhale 2 puffs into the lungs every 6 (six) hours as needed for wheezing or shortness of breath. 8 g 0  . amLODipine (NORVASC) 2.5 MG tablet Take 1 tablet daily . OFFICE VISIT NEEDED (Patient taking differently: Take 2.5 mg by mouth daily. ) 30 tablet 2  . aspirin EC 81 MG tablet Take 1 tablet (81 mg total) by mouth daily. 30 tablet 3  . atorvastatin (LIPITOR) 80 MG tablet Take 1 tablet (80 mg total) by mouth at bedtime. 90 tablet 0  . clonazePAM (KLONOPIN) 1 MG tablet Take 1 mg by mouth 3 (three) times daily as needed for anxiety. Take 1mg  tabletby mouth scheduled at bedtime, may take an additional dose if needed for anxiety    . clopidogrel (PLAVIX) 75 MG tablet Take 1 tablet (75 mg total) by mouth daily. 90 tablet 3  . furosemide (LASIX) 20 MG tablet Take 1 tablet (20 mg total) by mouth daily. 90 tablet 3  . loratadine (CLARITIN) 10 MG tablet Take 10 mg by mouth daily.     Marland Kitchen losartan (COZAAR) 25 MG tablet TAKE 1 TABLET BY MOUTH IN THE MORNING. 90 tablet 1  . methylPREDNISolone (MEDROL) 4 MG tablet Take 4 mg by mouth daily.     . metoprolol tartrate (LOPRESSOR) 25 MG tablet TAKE (1) TABLET BY MOUTH TWICE DAILY. (Patient taking differently: Take 25 mg by mouth 2 (two) times daily. ) 60 tablet 7  . mycophenolate (CELLCEPT) 500 MG  tablet Take 1,000 mg by mouth 2 (two) times daily.     . nitroGLYCERIN (NITROSTAT) 0.4 MG SL tablet DISSOLVE 1 TABLET UNDER TONGUE FOR CHEST TIGHTNESS AND SHORTNESS OF BREATH. MAY TAKE EVERY 5 MINUTES UP TO 3 DOSES. IF NO RELIEF AFTER 3 DOS (Patient taking differently: Place 0.4 mg under the tongue every 5 (five) minutes x 3 doses as needed for chest pain. ) 25 tablet 0  . pantoprazole (PROTONIX) 40 MG tablet TAKE 1 TABLET BY MOUTH ONCE A DAY. (Patient taking differently: Take 40 mg by mouth daily. ) 30 tablet 5  . traMADol (ULTRAM) 50 MG tablet Take 50 mg by mouth every 6 (six) hours as needed for moderate pain.      No facility-administered medications prior to visit.    Review of Systems  Review of Systems  Constitutional: Negative.   Respiratory: Negative for cough, shortness of breath and wheezing.   Psychiatric/Behavioral: Negative for sleep disturbance.   Physical Exam  BP 100/60 (BP  Location: Left Arm, Cuff Size: Large)   Pulse (!) 56   Temp 98 F (36.7 C) (Temporal)   Ht 6\' 1"  (1.854 m)   Wt 257 lb (116.6 kg)   SpO2 96%   BMI 33.91 kg/m  Physical Exam Constitutional:      Appearance: Normal appearance.  HENT:     Head: Normocephalic and atraumatic.     Mouth/Throat:     Mouth: Mucous membranes are moist.     Pharynx: Oropharynx is clear.  Cardiovascular:     Rate and Rhythm: Normal rate and regular rhythm.  Pulmonary:     Effort: Pulmonary effort is normal.     Breath sounds: No wheezing or rhonchi.     Comments: LS diminished, clear Musculoskeletal:        General: Normal range of motion.  Skin:    General: Skin is warm and dry.  Neurological:     General: No focal deficit present.     Mental Status: He is alert and oriented to person, place, and time. Mental status is at baseline.  Psychiatric:        Mood and Affect: Mood normal.        Behavior: Behavior normal.        Thought Content: Thought content normal.        Judgment: Judgment normal.       Lab Results:  CBC    Component Value Date/Time   WBC 7.1 04/02/2019 0007   RBC 5.03 04/02/2019 0007   HGB 14.6 04/06/2019 0803   HGB 15.3 03/07/2019 0901   HCT 43.0 04/06/2019 0803   HCT 42.5 03/07/2019 0901   PLT 194 04/02/2019 0007   PLT 173 03/07/2019 0901   MCV 98.2 04/02/2019 0007   MCV 92 03/07/2019 0901   MCH 33.0 04/02/2019 0007   MCHC 33.6 04/02/2019 0007   RDW 13.1 04/02/2019 0007   RDW 12.6 03/07/2019 0901   LYMPHSABS 1.0 10/23/2017 0752   LYMPHSABS 1.2 08/06/2014 0746   MONOABS 0.5 10/23/2017 0752   EOSABS 0.0 10/23/2017 0752   EOSABS 0.1 08/06/2014 0746   BASOSABS 0.0 10/23/2017 0752   BASOSABS 0.0 08/06/2014 0746    BMET    Component Value Date/Time   NA 142 04/06/2019 0803   NA 138 03/21/2019 1011   K 3.9 04/06/2019 0803   CL 102 04/02/2019 0007   CO2 27 04/02/2019 0007   GLUCOSE 110 (H) 04/02/2019 0007   BUN 24 (H) 04/02/2019 0007   BUN 17 03/21/2019 1011   CREATININE 1.07 04/02/2019 0007   CREATININE 1.04 11/11/2015 1607   CALCIUM 8.9 04/02/2019 0007   GFRNONAA >60 04/02/2019 0007   GFRNONAA 87 11/11/2015 1607   GFRAA >60 04/02/2019 0007   GFRAA >89 11/11/2015 1607    BNP    Component Value Date/Time   BNP 23.0 04/02/2019 0007    ProBNP    Component Value Date/Time   PROBNP 18.0 03/19/2015 1034    Imaging: CT Chest High Resolution  Result Date: 06/18/2019 CLINICAL DATA:  Interstitial lung disease, history of IPF, COVID January 2021, persistent shortness of breath EXAM: CT CHEST WITHOUT CONTRAST TECHNIQUE: Multidetector CT imaging of the chest was performed following the standard protocol without intravenous contrast. High resolution imaging of the lungs, as well as inspiratory and expiratory imaging, was performed. COMPARISON:  01/19/2017 FINDINGS: Cardiovascular: LAD coronary artery stents and scattered coronary calcifications. Normal heart size. No pericardial effusion. Mediastinum/Nodes: No enlarged mediastinal, hilar, or axillary  lymph nodes.  Thyroid gland, trachea, and esophagus demonstrate no significant findings. Lungs/Pleura: There is minimal dependent bibasilar irregular and ground-glass opacity with some suggestion of subpleural sparing (series 8, image 144). This is significantly improved in comparison to CT dated 01/19/2017, and additionally there is complete resolution of previously seen medial left upper lobe airspace opacity seen at that time. No significant air trapping on expiratory phase imaging. No pleural effusion or pneumothorax. Upper Abdomen: No acute abnormality. Musculoskeletal: No chest wall mass or suspicious bone lesions identified. IMPRESSION: 1. There is minimal dependent bibasilar irregular and ground-glass opacity, significantly improved in comparison to CT dated 01/19/2017, and additionally with complete resolution of previously seen left upper lobe airspace opacity seen at that time. Findings are consistent with minimal nonspecific post infectious or inflammatory scarring without specific evidence of fibrotic interstitial lung disease and no new evidence of fibrosis. 2. No acute airspace disease. 3. No significant air trapping on expiratory phase imaging. 4. Coronary artery disease. Electronically Signed   By: Eddie Candle M.D.   On: 06/18/2019 10:54     Assessment & Plan:   ILD (interstitial lung disease) ([place]) - Patient had presumed COVID in January 2021. He has a hx RA-ILD. Breathing has improved over the last month with 10lb weight loss - Testing: PFTs 06/21/19- FEV1 3.91 (88%), ratio 88, DLCO 32.62 ( 99%) - stable lung function and diffusion capacity compared to 2018 / HRCT 06/18/19 minimal ground glass opacity significantly improved from 2018. Previous LUL opacity has resolved. Minimal nonspecific post infectious scarring without evidence of new fibrosis. - Plan: Continue Cellcept 1,00mg  twice daily and methylprednisolone 4mg  for RA per rheumatology - Follow-up: 6 months OV / Spirometry with DLCO  in 1 year    Martyn Ehrich, NP 06/24/2019

## 2019-06-21 NOTE — Patient Instructions (Signed)
Pleasure seeing you today Mr Mabra, great work on weight loss  Testing results: -- Pulmonary function testing is stable  -- CT chest showed no evidence of new fibrosis, scarring noted.   Recommendations: -- Continue Cellcept and Methylprednisolone for rheumatoid arthritis  -- Recommend getting incentive spirometer and using 2-3 times a day for deep breathing exercises  ($7 dollars teleflex medical voldyne volumetric exerciser on [employer])  Orders: -- Spirometry with DLCO in 1 year   Follow-up: -- 6 months with Dr. Elsworth Soho

## 2019-06-24 NOTE — Assessment & Plan Note (Addendum)
-   Patient had presumed COVID in January 2021. He has a hx RA-ILD. Breathing has improved over the last month with 10lb weight loss - Testing: PFTs 06/21/19- FEV1 3.91 (88%), ratio 88, DLCO 32.62 ( 99%) - stable lung function and diffusion capacity compared to 2018 / HRCT 06/18/19 minimal ground glass opacity significantly improved from 2018. Previous LUL opacity has resolved. Minimal nonspecific post infectious scarring without evidence of new fibrosis. - Plan: Continue Cellcept 1,00mg  twice daily and methylprednisolone 4mg  for RA per rheumatology - Follow-up: 6 months OV / Spirometry with DLCO in 1 year

## 2019-06-25 DIAGNOSIS — M05741 Rheumatoid arthritis with rheumatoid factor of right hand without organ or systems involvement: Secondary | ICD-10-CM | POA: Diagnosis not present

## 2019-06-25 DIAGNOSIS — I10 Essential (primary) hypertension: Secondary | ICD-10-CM | POA: Diagnosis not present

## 2019-06-25 DIAGNOSIS — I2510 Atherosclerotic heart disease of native coronary artery without angina pectoris: Secondary | ICD-10-CM | POA: Diagnosis not present

## 2019-06-25 DIAGNOSIS — Z87891 Personal history of nicotine dependence: Secondary | ICD-10-CM | POA: Diagnosis not present

## 2019-07-04 DIAGNOSIS — E669 Obesity, unspecified: Secondary | ICD-10-CM | POA: Diagnosis not present

## 2019-07-04 DIAGNOSIS — Z6835 Body mass index (BMI) 35.0-35.9, adult: Secondary | ICD-10-CM | POA: Diagnosis not present

## 2019-07-04 DIAGNOSIS — M0609 Rheumatoid arthritis without rheumatoid factor, multiple sites: Secondary | ICD-10-CM | POA: Diagnosis not present

## 2019-07-04 DIAGNOSIS — J849 Interstitial pulmonary disease, unspecified: Secondary | ICD-10-CM | POA: Diagnosis not present

## 2019-07-04 DIAGNOSIS — I2510 Atherosclerotic heart disease of native coronary artery without angina pectoris: Secondary | ICD-10-CM | POA: Diagnosis not present

## 2019-07-04 DIAGNOSIS — M5440 Lumbago with sciatica, unspecified side: Secondary | ICD-10-CM | POA: Diagnosis not present

## 2019-07-04 DIAGNOSIS — M2550 Pain in unspecified joint: Secondary | ICD-10-CM | POA: Diagnosis not present

## 2019-08-23 ENCOUNTER — Other Ambulatory Visit: Payer: Self-pay | Admitting: Gastroenterology

## 2019-08-24 DIAGNOSIS — I2510 Atherosclerotic heart disease of native coronary artery without angina pectoris: Secondary | ICD-10-CM | POA: Diagnosis not present

## 2019-08-24 DIAGNOSIS — I10 Essential (primary) hypertension: Secondary | ICD-10-CM | POA: Diagnosis not present

## 2019-08-24 DIAGNOSIS — M05741 Rheumatoid arthritis with rheumatoid factor of right hand without organ or systems involvement: Secondary | ICD-10-CM | POA: Diagnosis not present

## 2019-08-24 DIAGNOSIS — Z87891 Personal history of nicotine dependence: Secondary | ICD-10-CM | POA: Diagnosis not present

## 2019-09-12 ENCOUNTER — Other Ambulatory Visit: Payer: Self-pay | Admitting: Cardiovascular Disease

## 2019-10-04 DIAGNOSIS — I2510 Atherosclerotic heart disease of native coronary artery without angina pectoris: Secondary | ICD-10-CM | POA: Diagnosis not present

## 2019-10-04 DIAGNOSIS — J849 Interstitial pulmonary disease, unspecified: Secondary | ICD-10-CM | POA: Diagnosis not present

## 2019-10-04 DIAGNOSIS — M2550 Pain in unspecified joint: Secondary | ICD-10-CM | POA: Diagnosis not present

## 2019-10-04 DIAGNOSIS — M0609 Rheumatoid arthritis without rheumatoid factor, multiple sites: Secondary | ICD-10-CM | POA: Diagnosis not present

## 2019-10-04 DIAGNOSIS — M5440 Lumbago with sciatica, unspecified side: Secondary | ICD-10-CM | POA: Diagnosis not present

## 2019-10-04 DIAGNOSIS — Z6835 Body mass index (BMI) 35.0-35.9, adult: Secondary | ICD-10-CM | POA: Diagnosis not present

## 2019-10-04 DIAGNOSIS — E669 Obesity, unspecified: Secondary | ICD-10-CM | POA: Diagnosis not present

## 2019-10-12 DIAGNOSIS — Z23 Encounter for immunization: Secondary | ICD-10-CM | POA: Diagnosis not present

## 2019-11-02 DIAGNOSIS — M9902 Segmental and somatic dysfunction of thoracic region: Secondary | ICD-10-CM | POA: Diagnosis not present

## 2019-11-02 DIAGNOSIS — M9905 Segmental and somatic dysfunction of pelvic region: Secondary | ICD-10-CM | POA: Diagnosis not present

## 2019-11-02 DIAGNOSIS — M5136 Other intervertebral disc degeneration, lumbar region: Secondary | ICD-10-CM | POA: Diagnosis not present

## 2019-11-02 DIAGNOSIS — M9903 Segmental and somatic dysfunction of lumbar region: Secondary | ICD-10-CM | POA: Diagnosis not present

## 2019-11-05 DIAGNOSIS — M9905 Segmental and somatic dysfunction of pelvic region: Secondary | ICD-10-CM | POA: Diagnosis not present

## 2019-11-05 DIAGNOSIS — M5136 Other intervertebral disc degeneration, lumbar region: Secondary | ICD-10-CM | POA: Diagnosis not present

## 2019-11-05 DIAGNOSIS — M9903 Segmental and somatic dysfunction of lumbar region: Secondary | ICD-10-CM | POA: Diagnosis not present

## 2019-11-05 DIAGNOSIS — M9902 Segmental and somatic dysfunction of thoracic region: Secondary | ICD-10-CM | POA: Diagnosis not present

## 2019-11-09 DIAGNOSIS — M9903 Segmental and somatic dysfunction of lumbar region: Secondary | ICD-10-CM | POA: Diagnosis not present

## 2019-11-09 DIAGNOSIS — M9902 Segmental and somatic dysfunction of thoracic region: Secondary | ICD-10-CM | POA: Diagnosis not present

## 2019-11-09 DIAGNOSIS — M9905 Segmental and somatic dysfunction of pelvic region: Secondary | ICD-10-CM | POA: Diagnosis not present

## 2019-11-09 DIAGNOSIS — M5136 Other intervertebral disc degeneration, lumbar region: Secondary | ICD-10-CM | POA: Diagnosis not present

## 2019-11-12 DIAGNOSIS — M9905 Segmental and somatic dysfunction of pelvic region: Secondary | ICD-10-CM | POA: Diagnosis not present

## 2019-11-12 DIAGNOSIS — M5136 Other intervertebral disc degeneration, lumbar region: Secondary | ICD-10-CM | POA: Diagnosis not present

## 2019-11-12 DIAGNOSIS — M9903 Segmental and somatic dysfunction of lumbar region: Secondary | ICD-10-CM | POA: Diagnosis not present

## 2019-11-12 DIAGNOSIS — M9902 Segmental and somatic dysfunction of thoracic region: Secondary | ICD-10-CM | POA: Diagnosis not present

## 2019-11-23 DIAGNOSIS — M5136 Other intervertebral disc degeneration, lumbar region: Secondary | ICD-10-CM | POA: Diagnosis not present

## 2019-11-23 DIAGNOSIS — M9905 Segmental and somatic dysfunction of pelvic region: Secondary | ICD-10-CM | POA: Diagnosis not present

## 2019-11-23 DIAGNOSIS — M9903 Segmental and somatic dysfunction of lumbar region: Secondary | ICD-10-CM | POA: Diagnosis not present

## 2019-11-23 DIAGNOSIS — M9902 Segmental and somatic dysfunction of thoracic region: Secondary | ICD-10-CM | POA: Diagnosis not present

## 2019-11-24 DIAGNOSIS — I10 Essential (primary) hypertension: Secondary | ICD-10-CM | POA: Diagnosis not present

## 2019-11-24 DIAGNOSIS — M05741 Rheumatoid arthritis with rheumatoid factor of right hand without organ or systems involvement: Secondary | ICD-10-CM | POA: Diagnosis not present

## 2019-11-24 DIAGNOSIS — Z87891 Personal history of nicotine dependence: Secondary | ICD-10-CM | POA: Diagnosis not present

## 2019-11-24 DIAGNOSIS — I2510 Atherosclerotic heart disease of native coronary artery without angina pectoris: Secondary | ICD-10-CM | POA: Diagnosis not present

## 2019-12-07 ENCOUNTER — Other Ambulatory Visit: Payer: Self-pay | Admitting: Cardiovascular Disease

## 2019-12-18 ENCOUNTER — Other Ambulatory Visit: Payer: Self-pay | Admitting: Cardiovascular Disease

## 2019-12-24 ENCOUNTER — Other Ambulatory Visit: Payer: Self-pay | Admitting: Cardiovascular Disease

## 2019-12-24 DIAGNOSIS — I10 Essential (primary) hypertension: Secondary | ICD-10-CM | POA: Diagnosis not present

## 2019-12-24 DIAGNOSIS — R5383 Other fatigue: Secondary | ICD-10-CM | POA: Diagnosis not present

## 2019-12-24 DIAGNOSIS — K219 Gastro-esophageal reflux disease without esophagitis: Secondary | ICD-10-CM | POA: Diagnosis not present

## 2019-12-24 DIAGNOSIS — E782 Mixed hyperlipidemia: Secondary | ICD-10-CM | POA: Diagnosis not present

## 2019-12-24 DIAGNOSIS — Z0001 Encounter for general adult medical examination with abnormal findings: Secondary | ICD-10-CM | POA: Diagnosis not present

## 2019-12-24 DIAGNOSIS — R7301 Impaired fasting glucose: Secondary | ICD-10-CM | POA: Diagnosis not present

## 2019-12-25 DIAGNOSIS — I10 Essential (primary) hypertension: Secondary | ICD-10-CM | POA: Diagnosis not present

## 2019-12-25 DIAGNOSIS — Z87891 Personal history of nicotine dependence: Secondary | ICD-10-CM | POA: Diagnosis not present

## 2019-12-25 DIAGNOSIS — I2510 Atherosclerotic heart disease of native coronary artery without angina pectoris: Secondary | ICD-10-CM | POA: Diagnosis not present

## 2019-12-28 DIAGNOSIS — R5383 Other fatigue: Secondary | ICD-10-CM | POA: Diagnosis not present

## 2019-12-28 DIAGNOSIS — R509 Fever, unspecified: Secondary | ICD-10-CM | POA: Diagnosis not present

## 2019-12-28 DIAGNOSIS — R52 Pain, unspecified: Secondary | ICD-10-CM | POA: Diagnosis not present

## 2019-12-28 DIAGNOSIS — Z20828 Contact with and (suspected) exposure to other viral communicable diseases: Secondary | ICD-10-CM | POA: Diagnosis not present

## 2020-01-10 DIAGNOSIS — G4733 Obstructive sleep apnea (adult) (pediatric): Secondary | ICD-10-CM | POA: Diagnosis not present

## 2020-01-11 DIAGNOSIS — I10 Essential (primary) hypertension: Secondary | ICD-10-CM | POA: Diagnosis not present

## 2020-01-11 DIAGNOSIS — R4582 Worries: Secondary | ICD-10-CM | POA: Diagnosis not present

## 2020-01-11 DIAGNOSIS — I2510 Atherosclerotic heart disease of native coronary artery without angina pectoris: Secondary | ICD-10-CM | POA: Diagnosis not present

## 2020-01-11 DIAGNOSIS — E782 Mixed hyperlipidemia: Secondary | ICD-10-CM | POA: Diagnosis not present

## 2020-01-11 DIAGNOSIS — Z6834 Body mass index (BMI) 34.0-34.9, adult: Secondary | ICD-10-CM | POA: Diagnosis not present

## 2020-01-11 DIAGNOSIS — R7301 Impaired fasting glucose: Secondary | ICD-10-CM | POA: Diagnosis not present

## 2020-01-11 DIAGNOSIS — M05741 Rheumatoid arthritis with rheumatoid factor of right hand without organ or systems involvement: Secondary | ICD-10-CM | POA: Diagnosis not present

## 2020-01-11 DIAGNOSIS — Z0001 Encounter for general adult medical examination with abnormal findings: Secondary | ICD-10-CM | POA: Diagnosis not present

## 2020-01-25 DIAGNOSIS — I2510 Atherosclerotic heart disease of native coronary artery without angina pectoris: Secondary | ICD-10-CM | POA: Diagnosis not present

## 2020-01-25 DIAGNOSIS — I10 Essential (primary) hypertension: Secondary | ICD-10-CM | POA: Diagnosis not present

## 2020-01-25 DIAGNOSIS — Z87891 Personal history of nicotine dependence: Secondary | ICD-10-CM | POA: Diagnosis not present

## 2020-02-18 DIAGNOSIS — I2510 Atherosclerotic heart disease of native coronary artery without angina pectoris: Secondary | ICD-10-CM | POA: Diagnosis not present

## 2020-02-18 DIAGNOSIS — M0609 Rheumatoid arthritis without rheumatoid factor, multiple sites: Secondary | ICD-10-CM | POA: Diagnosis not present

## 2020-02-18 DIAGNOSIS — E669 Obesity, unspecified: Secondary | ICD-10-CM | POA: Diagnosis not present

## 2020-02-18 DIAGNOSIS — J849 Interstitial pulmonary disease, unspecified: Secondary | ICD-10-CM | POA: Diagnosis not present

## 2020-02-18 DIAGNOSIS — M2550 Pain in unspecified joint: Secondary | ICD-10-CM | POA: Diagnosis not present

## 2020-02-18 DIAGNOSIS — Z6835 Body mass index (BMI) 35.0-35.9, adult: Secondary | ICD-10-CM | POA: Diagnosis not present

## 2020-02-18 DIAGNOSIS — M5440 Lumbago with sciatica, unspecified side: Secondary | ICD-10-CM | POA: Diagnosis not present

## 2020-03-17 ENCOUNTER — Other Ambulatory Visit: Payer: Self-pay | Admitting: Cardiovascular Disease

## 2020-03-31 ENCOUNTER — Other Ambulatory Visit: Payer: Self-pay | Admitting: Cardiovascular Disease

## 2020-04-04 DIAGNOSIS — M5136 Other intervertebral disc degeneration, lumbar region: Secondary | ICD-10-CM | POA: Diagnosis not present

## 2020-04-04 DIAGNOSIS — M9905 Segmental and somatic dysfunction of pelvic region: Secondary | ICD-10-CM | POA: Diagnosis not present

## 2020-04-04 DIAGNOSIS — M9903 Segmental and somatic dysfunction of lumbar region: Secondary | ICD-10-CM | POA: Diagnosis not present

## 2020-04-04 DIAGNOSIS — M9902 Segmental and somatic dysfunction of thoracic region: Secondary | ICD-10-CM | POA: Diagnosis not present

## 2020-05-06 ENCOUNTER — Other Ambulatory Visit: Payer: Self-pay | Admitting: Gastroenterology

## 2020-05-06 ENCOUNTER — Other Ambulatory Visit: Payer: Self-pay | Admitting: Cardiovascular Disease

## 2020-05-07 NOTE — Telephone Encounter (Signed)
Patient needs ov 08/2020 to continue with refills.

## 2020-05-08 ENCOUNTER — Encounter: Payer: Self-pay | Admitting: Internal Medicine

## 2020-05-21 DIAGNOSIS — M2550 Pain in unspecified joint: Secondary | ICD-10-CM | POA: Diagnosis not present

## 2020-05-21 DIAGNOSIS — J849 Interstitial pulmonary disease, unspecified: Secondary | ICD-10-CM | POA: Diagnosis not present

## 2020-05-21 DIAGNOSIS — M0609 Rheumatoid arthritis without rheumatoid factor, multiple sites: Secondary | ICD-10-CM | POA: Diagnosis not present

## 2020-05-21 DIAGNOSIS — I2510 Atherosclerotic heart disease of native coronary artery without angina pectoris: Secondary | ICD-10-CM | POA: Diagnosis not present

## 2020-05-21 DIAGNOSIS — E669 Obesity, unspecified: Secondary | ICD-10-CM | POA: Diagnosis not present

## 2020-05-21 DIAGNOSIS — Z6835 Body mass index (BMI) 35.0-35.9, adult: Secondary | ICD-10-CM | POA: Diagnosis not present

## 2020-05-21 DIAGNOSIS — M5440 Lumbago with sciatica, unspecified side: Secondary | ICD-10-CM | POA: Diagnosis not present

## 2020-06-04 ENCOUNTER — Other Ambulatory Visit: Payer: Self-pay | Admitting: Cardiovascular Disease

## 2020-06-23 DIAGNOSIS — Z87891 Personal history of nicotine dependence: Secondary | ICD-10-CM | POA: Diagnosis not present

## 2020-06-23 DIAGNOSIS — M05741 Rheumatoid arthritis with rheumatoid factor of right hand without organ or systems involvement: Secondary | ICD-10-CM | POA: Diagnosis not present

## 2020-06-23 DIAGNOSIS — I2510 Atherosclerotic heart disease of native coronary artery without angina pectoris: Secondary | ICD-10-CM | POA: Diagnosis not present

## 2020-06-23 DIAGNOSIS — I10 Essential (primary) hypertension: Secondary | ICD-10-CM | POA: Diagnosis not present

## 2020-07-03 DIAGNOSIS — E7849 Other hyperlipidemia: Secondary | ICD-10-CM | POA: Diagnosis not present

## 2020-07-03 DIAGNOSIS — K2100 Gastro-esophageal reflux disease with esophagitis, without bleeding: Secondary | ICD-10-CM | POA: Diagnosis not present

## 2020-07-03 DIAGNOSIS — R5383 Other fatigue: Secondary | ICD-10-CM | POA: Diagnosis not present

## 2020-07-03 DIAGNOSIS — R7301 Impaired fasting glucose: Secondary | ICD-10-CM | POA: Diagnosis not present

## 2020-07-03 DIAGNOSIS — E782 Mixed hyperlipidemia: Secondary | ICD-10-CM | POA: Diagnosis not present

## 2020-07-09 DIAGNOSIS — I10 Essential (primary) hypertension: Secondary | ICD-10-CM | POA: Diagnosis not present

## 2020-07-09 DIAGNOSIS — Z6835 Body mass index (BMI) 35.0-35.9, adult: Secondary | ICD-10-CM | POA: Diagnosis not present

## 2020-07-09 DIAGNOSIS — M05741 Rheumatoid arthritis with rheumatoid factor of right hand without organ or systems involvement: Secondary | ICD-10-CM | POA: Diagnosis not present

## 2020-07-09 DIAGNOSIS — I25111 Atherosclerotic heart disease of native coronary artery with angina pectoris with documented spasm: Secondary | ICD-10-CM | POA: Diagnosis not present

## 2020-07-09 DIAGNOSIS — J301 Allergic rhinitis due to pollen: Secondary | ICD-10-CM | POA: Diagnosis not present

## 2020-07-09 DIAGNOSIS — R7301 Impaired fasting glucose: Secondary | ICD-10-CM | POA: Diagnosis not present

## 2020-07-09 DIAGNOSIS — E7849 Other hyperlipidemia: Secondary | ICD-10-CM | POA: Diagnosis not present

## 2020-07-09 DIAGNOSIS — K2100 Gastro-esophageal reflux disease with esophagitis, without bleeding: Secondary | ICD-10-CM | POA: Diagnosis not present

## 2020-07-10 DIAGNOSIS — M9902 Segmental and somatic dysfunction of thoracic region: Secondary | ICD-10-CM | POA: Diagnosis not present

## 2020-07-10 DIAGNOSIS — M5136 Other intervertebral disc degeneration, lumbar region: Secondary | ICD-10-CM | POA: Diagnosis not present

## 2020-07-10 DIAGNOSIS — M9903 Segmental and somatic dysfunction of lumbar region: Secondary | ICD-10-CM | POA: Diagnosis not present

## 2020-07-10 DIAGNOSIS — M9905 Segmental and somatic dysfunction of pelvic region: Secondary | ICD-10-CM | POA: Diagnosis not present

## 2020-07-15 ENCOUNTER — Other Ambulatory Visit: Payer: Self-pay | Admitting: Cardiovascular Disease

## 2020-07-18 DIAGNOSIS — M9902 Segmental and somatic dysfunction of thoracic region: Secondary | ICD-10-CM | POA: Diagnosis not present

## 2020-07-18 DIAGNOSIS — M5136 Other intervertebral disc degeneration, lumbar region: Secondary | ICD-10-CM | POA: Diagnosis not present

## 2020-07-18 DIAGNOSIS — M9903 Segmental and somatic dysfunction of lumbar region: Secondary | ICD-10-CM | POA: Diagnosis not present

## 2020-07-18 DIAGNOSIS — M9905 Segmental and somatic dysfunction of pelvic region: Secondary | ICD-10-CM | POA: Diagnosis not present

## 2020-07-24 DIAGNOSIS — M05741 Rheumatoid arthritis with rheumatoid factor of right hand without organ or systems involvement: Secondary | ICD-10-CM | POA: Diagnosis not present

## 2020-07-24 DIAGNOSIS — Z87891 Personal history of nicotine dependence: Secondary | ICD-10-CM | POA: Diagnosis not present

## 2020-07-24 DIAGNOSIS — I2510 Atherosclerotic heart disease of native coronary artery without angina pectoris: Secondary | ICD-10-CM | POA: Diagnosis not present

## 2020-07-24 DIAGNOSIS — I10 Essential (primary) hypertension: Secondary | ICD-10-CM | POA: Diagnosis not present

## 2020-07-29 DIAGNOSIS — Z6835 Body mass index (BMI) 35.0-35.9, adult: Secondary | ICD-10-CM | POA: Diagnosis not present

## 2020-07-29 DIAGNOSIS — J329 Chronic sinusitis, unspecified: Secondary | ICD-10-CM | POA: Diagnosis not present

## 2020-07-31 ENCOUNTER — Other Ambulatory Visit: Payer: Self-pay | Admitting: Cardiovascular Disease

## 2020-08-05 ENCOUNTER — Other Ambulatory Visit: Payer: Self-pay

## 2020-08-05 ENCOUNTER — Ambulatory Visit: Payer: PPO | Admitting: Physician Assistant

## 2020-08-05 ENCOUNTER — Encounter: Payer: Self-pay | Admitting: Physician Assistant

## 2020-08-05 VITALS — BP 116/70 | HR 58 | Ht 73.0 in | Wt 262.8 lb

## 2020-08-05 DIAGNOSIS — I10 Essential (primary) hypertension: Secondary | ICD-10-CM | POA: Diagnosis not present

## 2020-08-05 DIAGNOSIS — R079 Chest pain, unspecified: Secondary | ICD-10-CM

## 2020-08-05 DIAGNOSIS — E785 Hyperlipidemia, unspecified: Secondary | ICD-10-CM

## 2020-08-05 DIAGNOSIS — I2510 Atherosclerotic heart disease of native coronary artery without angina pectoris: Secondary | ICD-10-CM

## 2020-08-05 NOTE — Patient Instructions (Signed)
Medication Instructions:  Your physician recommends that you continue on your current medications as directed. Please refer to the Current Medication list given to you today.  *If you need a refill on your cardiac medications before your next appointment, please call your pharmacy*  Lab Work: NONE ordered at this time of appointment   If you have labs (blood work) drawn today and your tests are completely normal, you will receive your results only by: [place] (if you have MyChart) OR A paper copy in the mail If you have any lab test that is abnormal or we need to change your treatment, we will call you to review the results.  Testing/Procedures: Your physician has requested that you have a lexiscan myoview. For further information please visit [email or web address]. Please follow instruction sheet, as given.  Please schedule for 2-3 weeks at [facility] office   Follow-Up: At [facility], you and your health needs are our priority.  As part of our continuing mission to provide you with exceptional heart care, we have created designated Provider Care Teams.  These Care Teams include your primary Cardiologist (physician) and Advanced Practice Providers (APPs -  Physician Assistants and Nurse Practitioners) who all work together to provide you with the care you need, when you need it.  Your next appointment:   2 month(s)  The format for your next appointment:   In Person  Provider:   [name], MD or APP  Other Instructions

## 2020-08-05 NOTE — Progress Notes (Signed)
Cardiology Office Note:    Date:  08/07/2020   ID:  Richard Simpson, DOB 08-26-1970, MRN [id number]  PCP:  [facility] [place] [facility] Cardiologist:  [name], MD     Referring MD: Loa Socks, MD   Chief Complaint  Patient presents with   Follow-up    Seen for Dr. Claiborne Billings    History of Present Illness:    Richard Simpson is a 50 y.o. male with a hx of CAD s/p DES to LAD in June 2016, anxiety, depression, HLD, HTN, GERD, interstitial lung disease, ICM, RA, restless leg syndrome, and obstructive sleep apnea.  He underwent repeat cardiac catheterization in February 2017 for recurrent chest pain and found to have widely patent LAD stent.  Etiology for angina was felt to be microvascular disease versus diastolic dysfunction.  He had another cardiac catheterization in October 2018 for chest pain which again showed patent LAD with no significant residual disease.  The chest pain was nitro responsive.  Last echocardiogram obtained on 03/07/2019 showed EF 65 to 70%, grade 1 DD, normal PASP, trivial AI.  He underwent a repeat cardiac catheterization on 04/06/2019 that showed widely patent proximal LAD stent, 35% lesion in mid LAD, no other significant coronary artery disease.  Right heart pressure was normal.  LVEDP was normal.  His chest discomfort and shortness of breath was felt to be related to pulmonary disease.  Medical therapy was recommended.  He is being closely followed by pulmonology service for interstitial lung disease.  Patient has been doing well until few weeks ago.  [place] he was at the church, he had a jaw pain and left throat pain along with flushing sensation and the dizziness.  He went home and the symptom recurred again, this time lasting somewhere between 30 minutes to a hour.  Since then, he has had a multiple episode of throat tightness.  With the reassuring cardiac catheterization last year, I am hesitant to pursue any invasive work-up.  I recommend  a Myoview.  If negative, would not recommend any further work-up.  He is fairly active, suspicion for PE very unlikely.  He denies any recent orthopnea or PND.  Previously he has been using Lasix on as-needed basis, in the past week, he has been using Lasix on a daily basis due to lower extremity edema.  On physical exam, I do not see any lower extremity edema.  Past Medical History:  Diagnosis Date   Anxiety    CAD S/P percutaneous coronary angioplasty 10/2013   a. 10/2013 Cath: mod LAD dzs->nl FFR-> medically managed;  b. 06/2014 NSTEMI/PCi: LAD 99ost, 70p, 28m (entire area covered with a 3.5x20 Synergy DES), LCX nl, RCA 76m, 10d, EF 45-50%. c. 10/2015: cath showing patent LAD stent with 15% Prox LAD stenosis. EF 55-65%.    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 06/2014 EF 45-50% by LV gram b. Echo July 2016: EF 60-65%. No RWMA. Gr 1 DD    NSTEMI (non-ST elevated myocardial infarction) ([place]) 06/2014   Obesity (BMI 30-39.9) 10/29/2013   Primary snoring 10/29/2013   Rheumatoid arthritis ([place]) 09/23/2013  RLS (restless legs syndrome) 10/29/2013   Sleep apnea     Past Surgical History:  Procedure Laterality Date   BIOPSY  09/27/2018   Procedure: BIOPSY;  Surgeon: Daneil Dolin, MD;  [facility];  Service: Endoscopy;;   CARDIAC CATHETERIZATION N/A 07/05/2014   Procedure: Left Heart Cath and Coronary Angiography;  Surgeon: Troy Sine, MD;  Location: [facility];  Service: Cardiovascular;  NSTEMI --> Ostial LAd 99%, prox 80% & early mid 50% --> PCI   CARDIAC CATHETERIZATION  07/05/2014   Procedure: Coronary Stent Intervention;  Surgeon: Troy Sine, MD;  Location: [facility];  Service: Cardiovascular; Ostial-prox LAD tandem 99, 80 & 50% --> Synergy DES 3.5 x 20 (3.74)   CARDIAC CATHETERIZATION N/A 03/24/2015   Procedure: Left Heart Cath and Coronary Angiography;  Surgeon: Leonie Man, MD;  Location: [facility];  Service: Cardiovascular;  Laterality: N/A;   CARDIAC CATHETERIZATION N/A 11/20/2015   Procedure: Left Heart Cath and Coronary Angiography;  Surgeon: Troy Sine, MD;  Location: [facility];  Service: Cardiovascular;  Laterality: N/A;   CHOLECYSTECTOMY     10 yrs ago-[facility]   COLONOSCOPY N/A 03/23/2013   Procedure: COLONOSCOPY;  Surgeon: Daneil Dolin, MD;  [facility];  Service: Endoscopy;  Laterality: N/A;  2:15-moved to 1030 Staff notified pt   COLONOSCOPY WITH PROPOFOL N/A 06/10/2016   Procedure: COLONOSCOPY WITH PROPOFOL;  Surgeon: Daneil Dolin, MD;  [facility];  Service: Endoscopy;  Laterality: N/A;  730    ESOPHAGOGASTRODUODENOSCOPY (EGD) WITH PROPOFOL N/A 09/27/2018   Procedure: ESOPHAGOGASTRODUODENOSCOPY (EGD) WITH PROPOFOL;  Surgeon: Daneil Dolin, MD;  [facility];  Service: Endoscopy;  Laterality: N/A;  1:30pm   FRACTIONAL FLOW RESERVE WIRE  11/23/2013   Procedure: FRACTIONAL FLOW RESERVE WIRE;  Surgeon: Jettie Booze, MD;  Location: [facility];  Service: Cardiovascular;;   GALLBLADDER SURGERY     LEFT HEART CATHETERIZATION WITH CORONARY ANGIOGRAM N/A 11/23/2013   Procedure: LEFT HEART CATHETERIZATION WITH CORONARY ANGIOGRAM;  Surgeon: Jettie Booze, MD;  Location: [facility];  Service: Cardiovascular;  Laterality: N/A;   MASS EXCISION  09/09/2010   Procedure: EXCISION MASS;  Surgeon: Arther Abbott, MD;  Location: [facility];  Service: Orthopedics;  Laterality: Left;  Excision Mass Left Long Finger   POLYPECTOMY  06/10/2016   Procedure: POLYPECTOMY;  Surgeon: Daneil Dolin, MD;  [facility];  Service: Endoscopy;;  colon   RIGHT/LEFT HEART CATH  AND CORONARY ANGIOGRAPHY N/A 04/06/2019   Procedure: RIGHT/LEFT HEART CATH AND CORONARY ANGIOGRAPHY;  Surgeon: Martinique, Peter M, MD;  Location: [place] [facility];  Service: Cardiovascular;  Laterality: N/A;   THROAT SURGERY     nodules removed from throat as child   TRANSTHORACIC ECHOCARDIOGRAM   July 2016:    EF 60-65%. No RWMA. Gr 1 DD    Current Medications: Current Meds  Medication Sig   albuterol (PROAIR HFA) 108 (90 Base) MCG/ACT inhaler Inhale 2 puffs into the lungs every 6 (six) hours as needed for wheezing or shortness of breath.   amLODipine (NORVASC) 5 MG tablet TAKE 1/2 TABLET BY MOUTH ONCE A DAY.   amoxicillin-clavulanate (AUGMENTIN) 875-125 MG tablet Take 1 tablet by mouth 2 (two) times daily.   aspirin EC 81 MG tablet Take 1 tablet (81 mg total) by mouth daily.   atorvastatin (LIPITOR) 80 MG tablet TAKE (1) TABLET BY MOUTH  ONCE DAILY.   clonazePAM (KLONOPIN) 1 MG tablet Take 1 mg by mouth 3 (three) times daily as needed for anxiety. Take 1mg  tabletby mouth scheduled at bedtime, may take an additional dose if needed for anxiety   clopidogrel (PLAVIX) 75 MG tablet Take 1 tablet (75 mg total) by mouth daily. NEED OV.   furosemide (LASIX) 20 MG tablet Take 1 tablet (20 mg total) by mouth daily.   loratadine (CLARITIN) 10 MG tablet Take 10 mg by mouth daily.    losartan (COZAAR) 25 MG tablet TAKE 1 TABLET BY MOUTH IN THE MORNING.   methylPREDNISolone (MEDROL) 4 MG tablet Take 4 mg by mouth daily.    metoprolol tartrate (LOPRESSOR) 25 MG tablet TAKE (1) TABLET BY MOUTH TWICE DAILY.   mycophenolate (CELLCEPT) 500 MG tablet Take 1,000 mg by mouth 2 (two) times daily.    nitroGLYCERIN (NITROSTAT) 0.4 MG SL tablet DISSOLVE 1 TABLET UNDER TONGUE FOR CHEST TIGHTNESS AND SHORTNESS OF BREATH. MAY TAKE EVERY 5 MINUTES UP TO 3 DOSES. IF NO RELIEF AFTER 3 DOS (Patient taking differently: Place 0.4 mg under the tongue every 5 (five) minutes x 3 doses as needed for chest pain.)   pantoprazole  (PROTONIX) 40 MG tablet TAKE 1 TABLET BY MOUTH ONCE A DAY.   traMADol (ULTRAM) 50 MG tablet Take 50 mg by mouth every 6 (six) hours as needed for moderate pain.      Allergies:   Lisinopril and Prednisone   Social History   Socioeconomic History   Marital status: Married    Spouse name: Renee   Number of children: 2   Years of education: 12th grade   Highest education level: Not on file  Occupational History   Occupation: Retired Chemical engineer: [employer]  Tobacco Use   Smoking status: Former    Packs/day: 0.25    Years: 15.00    Pack years: 3.75    Types: Cigarettes    Quit date: 09/04/2003    Years since quitting: 16.9   Smokeless tobacco: Current    Types: Chew  Vaping Use   Vaping Use: Never used  Substance and Sexual Activity   Alcohol use: Yes    Alcohol/week: 1.0 - 2.0 standard drink    Types: 1 - 2 Cans of beer per week    Comment: rare   Drug use: No   Sexual activity: Yes  Other Topics Concern   Not on file  Social History Narrative   Patient lives at home with his wife Richard Simpson).    Patient works full time Dentist..   Education high school.   Right handed.   Caffeine one cup of coffee daily and one soda.   Social Determinants of Health   Financial Resource Strain: Not on file  Food Insecurity: Not on file  Transportation Needs: Not on file  Physical Activity: Not on file  Stress: Not on file  Social Connections: Not on file     Family History: The patient's family history includes Arthritis in his paternal uncle; Cancer in his maternal grandfather; Diabetes in his maternal grandmother, mother, paternal grandfather, and other family members; Hypertension in his maternal grandmother and mother. There is no history of Anesthesia problems, Hypotension, Malignant hyperthermia, Pseudochol deficiency, Heart attack, or Stroke.  ROS:   Please see the history of present illness.     All other systems reviewed and are  negative.  EKGs/Labs/Other Studies Reviewed:    The following studies  were reviewed today:  Echo 03/07/2019 IMPRESSIONS     1. Left ventricular ejection fraction, by estimation, is 65 to 70%. The  left ventricle has normal function. The left ventrical is not well  visualized to evaluate regional wall motion. There is mildly increased  left ventricular hypertrophy. Left  ventricular diastolic parameters are consistent with Grade I diastolic  dysfunction (impaired relaxation.)   2. Right ventricular systolic function is normal. The right ventricular  size is normal. There is normal pulmonary artery systolic pressure.   3. The mitral valve is abnormal. trivial mitral valve regurgitation. No  evidence of mitral stenosis.   4. The aortic valve is tricuspid. Aortic valve regurgitation is trivial .  No aortic stenosis is present.   5. The inferior vena cava is normal in size with greater than 50%  respiratory variability, suggesting right atrial pressure of 3 mmHg.   EKG:  EKG is ordered today.  The ekg ordered today demonstrates NSR without significant ST-T wave changes  Recent Labs: No results found for requested labs within last 8760 hours.  Recent Lipid Panel    Component Value Date/Time   CHOL 129 03/07/2019 0901   TRIG 87 03/07/2019 0901   HDL 39 (L) 03/07/2019 0901   CHOLHDL 3.3 03/07/2019 0901   CHOLHDL 3.7 04/16/2015 0820   VLDL 14 04/16/2015 0820   LDLCALC 73 03/07/2019 0901     Risk Assessment/Calculations:           Physical Exam:    VS:  BP 116/70 (BP Location: Right Arm, Patient Position: Sitting, Cuff Size: Large)   Pulse (!) 58   Ht 6\' 1"  (1.854 m)   Wt 262 lb 12.8 oz (119.2 kg)   SpO2 97%   BMI 34.67 kg/m     Wt Readings from Last 3 Encounters:  08/05/20 262 lb 12.8 oz (119.2 kg)  06/21/19 257 lb (116.6 kg)  04/17/19 265 lb (120.2 kg)     GEN:  Well nourished, well developed in no acute distress HEENT: Normal NECK: No JVD; No carotid  bruits LYMPHATICS: No lymphadenopathy CARDIAC: RRR, no murmurs, rubs, gallops RESPIRATORY:  Clear to auscultation without rales, wheezing or rhonchi  ABDOMEN: Soft, non-tender, non-distended MUSCULOSKELETAL:  No edema; No deformity  SKIN: Warm and dry NEUROLOGIC:  Alert and oriented x 3 PSYCHIATRIC:  Normal affect   ASSESSMENT:    1. Chest pain, unspecified type   2. Coronary artery disease involving native coronary artery of native heart without angina pectoris   3. Essential hypertension   4. Hyperlipidemia LDL goal <70    PLAN:    In order of problems listed above:  Chest pain: Recent symptoms occurred at rest, does not seems to have a direct correlation with exertion. Proceed with myoview.   CAD: ASA and lipitor  HTN: continue on the current therapy  HLD: on lipitor   Shared Decision Making/Informed Consent The risks [chest pain, shortness of breath, cardiac arrhythmias, dizziness, blood pressure fluctuations, myocardial infarction, stroke/transient ischemic attack, nausea, vomiting, allergic reaction, radiation exposure, metallic taste sensation and life-threatening complications (estimated to be 1 in 10,000)], benefits (risk stratification, diagnosing coronary artery disease, treatment guidance) and alternatives of a nuclear stress test were discussed in detail with Richard Simpson and he agrees to proceed.    Medication Adjustments/Labs and Tests Ordered: Current medicines are reviewed at length with the patient today.  Concerns regarding medicines are outlined above.  Orders Placed This Encounter  Procedures   Cardiac Stress Test: Informed Consent  Details: Physician/Practitioner Attestation; Transcribe to consent form and obtain patient signature   MYOCARDIAL PERFUSION IMAGING   EKG 12-Lead   No orders of the defined types were placed in this encounter.   Patient Instructions  Medication Instructions:  Your physician recommends that you continue on your current  medications as directed. Please refer to the Current Medication list given to you today.  *If you need a refill on your cardiac medications before your next appointment, please call your pharmacy*  Lab Work: NONE ordered at this time of appointment   If you have labs (blood work) drawn today and your tests are completely normal, you will receive your results only by: [place] (if you have MyChart) OR A paper copy in the mail If you have any lab test that is abnormal or we need to change your treatment, we will call you to review the results.  Testing/Procedures: Your physician has requested that you have a lexiscan myoview. For further information please visit [email or web address]. Please follow instruction sheet, as given.  Please schedule for 2-3 weeks at [facility] office   Follow-Up: At [facility], you and your health needs are our priority.  As part of our continuing mission to provide you with exceptional heart care, we have created designated Provider Care Teams.  These Care Teams include your primary Cardiologist (physician) and Advanced Practice Providers (APPs -  Physician Assistants and Nurse Practitioners) who all work together to provide you with the care you need, when you need it.  Your next appointment:   2 month(s)  The format for your next appointment:   In Person  Provider:   [name], MD or APP  Other Instructions    Signed, Almyra Deforest, [place]  08/07/2020 4:39 PM    [place]

## 2020-08-06 ENCOUNTER — Other Ambulatory Visit: Payer: Self-pay | Admitting: Cardiovascular Disease

## 2020-08-07 ENCOUNTER — Encounter: Payer: Self-pay | Admitting: Physician Assistant

## 2020-08-14 ENCOUNTER — Telehealth (HOSPITAL_COMMUNITY): Payer: Self-pay

## 2020-08-14 NOTE — Telephone Encounter (Signed)
Spoke with the patient, detailed instructions given. He stated that he would be here for his test. Asked to call back with any questions. [name] EMTP 

## 2020-08-18 ENCOUNTER — Other Ambulatory Visit: Payer: Self-pay | Admitting: Cardiovascular Disease

## 2020-08-19 ENCOUNTER — Other Ambulatory Visit: Payer: Self-pay

## 2020-08-19 ENCOUNTER — Ambulatory Visit (HOSPITAL_COMMUNITY): Payer: PPO | Attending: Physician Assistant

## 2020-08-19 DIAGNOSIS — R079 Chest pain, unspecified: Secondary | ICD-10-CM | POA: Diagnosis not present

## 2020-08-19 LAB — MYOCARDIAL PERFUSION IMAGING
LV dias vol: 96 mL (ref 62–150)
LV sys vol: 45 mL
Peak HR: 81 {beats}/min
Rest HR: 57 {beats}/min
SDS: 2
SRS: 0
SSS: 2
TID: 1.04

## 2020-08-19 MED ORDER — TECHNETIUM TC 99M TETROFOSMIN IV KIT
32.7000 | PACK | Freq: Once | INTRAVENOUS | Status: AC | PRN
  Administered 2020-08-19: 32.7 via INTRAVENOUS
  Filled 2020-08-19: qty 33

## 2020-08-19 MED ORDER — REGADENOSON 0.4 MG/5ML IV SOLN
0.4000 mg | Freq: Once | INTRAVENOUS | Status: AC
  Administered 2020-08-19: 0.4 mg via INTRAVENOUS

## 2020-08-19 MED ORDER — TECHNETIUM TC 99M TETROFOSMIN IV KIT
11.0000 | PACK | Freq: Once | INTRAVENOUS | Status: AC | PRN
  Administered 2020-08-19: 11 via INTRAVENOUS
  Filled 2020-08-19: qty 11

## 2020-09-01 NOTE — Telephone Encounter (Signed)
Called Renee Riva(wife) she states that pt has been having SOB first that this progresses thru the day and starts having chest pain which is relieved by taking the 1 nitro with no headache after. She states that pt is telling her that this is what if felt like before his last MI/procedure. This scares the pt and makes the wife anxious. This has been happening 3+ times a week. He has a lung issue too, interstitial lung disease. So it hard to tell if it is this vs "heart issue". Informed wife that we do not have any sooner appointments and if pt is having CP this often he will need to go to the ER for evaluation. Verbalizes understanding she will take him to the ER next time. I have entered wait list request

## 2020-09-22 DIAGNOSIS — Z79899 Other long term (current) drug therapy: Secondary | ICD-10-CM | POA: Diagnosis not present

## 2020-09-22 DIAGNOSIS — M0609 Rheumatoid arthritis without rheumatoid factor, multiple sites: Secondary | ICD-10-CM | POA: Diagnosis not present

## 2020-10-02 ENCOUNTER — Other Ambulatory Visit: Payer: Self-pay | Admitting: Cardiovascular Disease

## 2020-10-06 ENCOUNTER — Other Ambulatory Visit: Payer: Self-pay | Admitting: Cardiovascular Disease

## 2020-10-08 NOTE — Progress Notes (Signed)
Cardiology Office Note:    Date:  10/09/2020   ID:  Richard Simpson, DOB [birth year], MRN [id number]  PCP:  [facility] [place] Cardiologist: [name], MD   Reason for visit: 62-monthfollow-up  History of Present Illness:    Richard GRATTONis a 50y.o. male with a hx of CAD s/p DES to LAD in June 2016, anxiety, depression, HLD, HTN, GERD, interstitial lung disease, ICM, RA, restless leg syndrome, and obstructive sleep apnea.  LHC February 2017 for recurrent chest pain and found to have widely patent LAD stent.  Etiology for angina was felt to be microvascular disease versus diastolic dysfunction.  He had another cardiac catheterization in October 2018 for chest pain which again showed patent LAD with no significant residual disease.  The chest pain was nitro responsive.  Last echocardiogram obtained on 03/07/2019 showed EF 65 to 70%, grade 1 DD, normal PASP, trivial AI.  He underwent a repeat cardiac catheterization on 04/06/2019 that showed widely patent proximal LAD stent, 35% lesion in mid LAD, no other significant coronary artery disease.  Right heart pressure was normal.  LVEDP was normal.  His chest discomfort and shortness of breath was felt to be related to pulmonary disease.  Medical therapy was recommended.  He is being closely followed by pulmonology service for interstitial lung disease.   Patient saw HAlmyra DeforestPA on 08/05/2020 and noted episode of jaw and left throat pain associated with a flushing sensation dizziness.  Following this, he had recurrent episodes of throat tightness.  Myoview stress test was ordered and showed no evidence of ischemia or previous infarction.  Normal LV function.  Patient's wife called the office on August 8 and mentioned that the patient has been having shortness of breath that progresses to the day started having chest pain relieved by 1 nitro.  The patient felt like this was similar to what he had before his last heart  attack/procedure.  She mentioned it is hard to tell if this is a heart issue versus his interstitial lung disease.  Today, the patient comes in concerned about his anginal episodes (~3 episodes/week).  He does say overall he feels like it is a little better now that the weather is cooler.  He has episodes of a squeezing pain in the middle of his chest that can get intense and radiate to his back.  These occur sometimes at rest and other times after exertion like cleaning.  He is remodeling his son's house and sometimes can do activities such as laying down floor without symptoms.  When symptoms do come on, initially he feels like he cannot catch his breath and then he starts with chest pain.  Episodes can last a few minutes but if they last more than 15 minutes he takes a nitro.  Typically after nitro, his chest pain resolves within 10 minutes.  Looking through his old notes, he did attempt Imdur in 2017 that had some dizziness and headache.     Past Medical History:  Diagnosis Date   Anxiety    CAD S/P percutaneous coronary angioplasty 10/2013   a. 10/2013 Cath: mod LAD dzs->nl FFR-> medically managed;  b. 06/2014 NSTEMI/PCi: LAD 99ost, 70p, 570mentire area covered with a 3.5x20 Synergy DES), LCX nl, RCA 2037m0d, EF 45-50%. c. 10/2015: cath showing patent LAD stent with 15% Prox LAD stenosis. EF 55-65%.    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 06/2014 EF 45-50% by LV gram b. Echo July 2016: EF 60-65%. No RWMA. Gr 1 DD    NSTEMI (non-ST elevated myocardial infarction) ([place]) 06/2014   Obesity (BMI 30-39.9) 10/29/2013   Primary snoring 10/29/2013   Rheumatoid arthritis ([place]) 09/23/2013   RLS  (restless legs syndrome) 10/29/2013   Sleep apnea     Past Surgical History:  Procedure Laterality Date   BIOPSY  09/27/2018   Procedure: BIOPSY;  Surgeon: Daneil Dolin, MD;  [facility];  Service: Endoscopy;;   CARDIAC CATHETERIZATION N/A 07/05/2014   Procedure: Left Heart Cath and Coronary Angiography;  Surgeon: Troy Sine, MD;  Location: [place] [facility];  Service: Cardiovascular;  NSTEMI --> Ostial LAd 99%, prox 80% & early mid 50% --> PCI   CARDIAC CATHETERIZATION  07/05/2014   Procedure: Coronary Stent Intervention;  Surgeon: Troy Sine, MD;  Location: [place] [facility];  Service: Cardiovascular; Ostial-prox LAD tandem 99, 80 & 50% --> Synergy DES 3.5 x 20 (3.74)   CARDIAC CATHETERIZATION N/A 03/24/2015   Procedure: Left Heart Cath and Coronary Angiography;  Surgeon: Leonie Man, MD;  Location: [place] [facility];  Service: Cardiovascular;  Laterality: N/A;   CARDIAC CATHETERIZATION N/A 11/20/2015   Procedure: Left Heart Cath and Coronary Angiography;  Surgeon: Troy Sine, MD;  Location: [place] [facility];  Service: Cardiovascular;  Laterality: N/A;   CHOLECYSTECTOMY     10 yrs ago-[facility]   COLONOSCOPY N/A 03/23/2013   Procedure: COLONOSCOPY;  Surgeon: Daneil Dolin, MD;  [facility];  Service: Endoscopy;  Laterality: N/A;  2:15-moved to 1030 Staff notified pt   COLONOSCOPY WITH PROPOFOL N/A 06/10/2016   Procedure: COLONOSCOPY WITH PROPOFOL;  Surgeon: Daneil Dolin, MD;  [facility];  Service: Endoscopy;  Laterality: N/A;  730    ESOPHAGOGASTRODUODENOSCOPY (EGD) WITH PROPOFOL N/A 09/27/2018   Procedure: ESOPHAGOGASTRODUODENOSCOPY (EGD) WITH PROPOFOL;  Surgeon: Daneil Dolin, MD;  [facility];  Service: Endoscopy;  Laterality: N/A;  1:30pm   FRACTIONAL FLOW RESERVE WIRE  11/23/2013   Procedure: FRACTIONAL FLOW RESERVE WIRE;  Surgeon: Jettie Booze, MD;  Location: [facility] C[facility];  Service: Cardiovascular;;    GALLBLADDER SURGERY     LEFT HEART CATHETERIZATION WITH CORONARY ANGIOGRAM N/A 11/23/2013   Procedure: LEFT HEART CATHETERIZATION WITH CORONARY ANGIOGRAM;  Surgeon: Jettie Booze, MD;  Location: [facility] CATH LAB;  Service: Cardiovascular;  Laterality: N/A;   MASS EXCISION  09/09/2010   Procedure: EXCISION MASS;  Surgeon: Arther Abbott, MD;  [facility];  Service: Orthopedics;  Laterality: Left;  Excision Mass Left Long Finger   POLYPECTOMY  06/10/2016   Procedure: POLYPECTOMY;  Surgeon: Daneil Dolin, MD;  [facility];  Service: Endoscopy;;  colon   RIGHT/LEFT HEART CATH AND CORONARY ANGIOGRAPHY N/A 04/06/2019   Procedure: RIGHT/LEFT HEART CATH AND CORONARY ANGIOGRAPHY;  Surgeon: Martinique, Peter M, MD;  Location: [place] [facility];  Service: Cardiovascular;  Laterality: N/A;   THROAT SURGERY     nodules removed from throat as child   TRANSTHORACIC ECHOCARDIOGRAM   July 2016:    EF 60-65%. No  RWMA. Gr 1 DD    Current Medications: Current Meds  Medication Sig   albuterol (PROAIR HFA) 108 (90 Base) MCG/ACT inhaler Inhale 2 puffs into the lungs every 6 (six) hours as needed for wheezing or shortness of breath.   amoxicillin-clavulanate (AUGMENTIN) 875-125 MG tablet Take 1 tablet by mouth 2 (two) times daily.   aspirin EC 81 MG tablet Take 1 tablet (81 mg total) by mouth daily.   atorvastatin (LIPITOR) 80 MG tablet TAKE (1) TABLET BY MOUTH ONCE DAILY.   clonazePAM (KLONOPIN) 1 MG tablet Take 1 mg by mouth 3 (three) times daily as needed for anxiety. Take '1mg'$  tabletby mouth scheduled at bedtime, may take an additional dose if needed for anxiety   clopidogrel (PLAVIX) 75 MG tablet TAKE 1 TABLET BY MOUTH ONCE A DAY.   ezetimibe (ZETIA) 10 MG tablet Take 1 tablet (10 mg total) by mouth daily.   furosemide (LASIX) 20 MG tablet Take 1 tablet (20 mg total) by mouth daily.   loratadine (CLARITIN) 10 MG tablet Take 10 mg by mouth daily.    methylPREDNISolone (MEDROL) 4 MG tablet Take  4 mg by mouth daily.    mycophenolate (CELLCEPT) 500 MG tablet Take 1,000 mg by mouth 2 (two) times daily.    nitroGLYCERIN (NITROSTAT) 0.4 MG SL tablet DISSOLVE 1 TABLET UNDER TONGUE FOR CHEST TIGHTNESS AND SHORTNESS OF BREATH. MAY TAKE EVERY 5 MINUTES UP TO 3 DOSES. IF NO RELIEF AFTER 3 DOS (Patient taking differently: Place 0.4 mg under the tongue every 5 (five) minutes x 3 doses as needed for chest pain.)   pantoprazole (PROTONIX) 40 MG tablet TAKE 1 TABLET BY MOUTH ONCE A DAY.   traMADol (ULTRAM) 50 MG tablet Take 50 mg by mouth every 6 (six) hours as needed for moderate pain.    [DISCONTINUED] amLODipine (NORVASC) 5 MG tablet TAKE 1/2 TABLET BY MOUTH ONCE A DAY.   [DISCONTINUED] losartan (COZAAR) 25 MG tablet TAKE 1 TABLET BY MOUTH IN THE MORNING.   [DISCONTINUED] metoprolol tartrate (LOPRESSOR) 25 MG tablet TAKE (1) TABLET BY MOUTH TWICE DAILY.     Allergies:   Lisinopril and Prednisone   Social History   Socioeconomic History   Marital status: Married    Spouse name: Renee   Number of children: 2   Years of education: 12th grade   Highest education level: Not on file  Occupational History   Occupation: Retired Chemical engineer: [employer]  Tobacco Use   Smoking status: Former    Packs/day: 0.25    Years: 15.00    Pack years: 3.75    Types: Cigarettes    Quit date: 09/04/2003    Years since quitting: 17.1   Smokeless tobacco: Current    Types: Chew  Vaping Use   Vaping Use: Never used  Substance and Sexual Activity   Alcohol use: Yes    Alcohol/week: 1.0 - 2.0 standard drink    Types: 1 - 2 Cans of beer per week    Comment: rare   Drug use: No   Sexual activity: Yes  Other Topics Concern   Not on file  Social History Narrative   Patient lives at home with his wife [name]).    Patient works full time Dentist..   Education high school.   Right handed.   Caffeine one cup of coffee daily and one soda.   Social Determinants of  Health   Financial Resource Strain: Not on file  Food Insecurity: Not on file  Transportation Needs: Not on file  Physical Activity: Not on file  Stress: Not on file  Social Connections: Not on file     Family History: The patient's family history includes Arthritis in his paternal uncle; Cancer in his maternal grandfather; Diabetes in his maternal grandmother, mother, paternal grandfather, and other family members; Hypertension in his maternal grandmother and mother. There is no history of Anesthesia problems, Hypotension, Malignant hyperthermia, Pseudochol deficiency, Heart attack, or Stroke.  ROS:   Please see the history of present illness.     EKGs/Labs/Other Studies Reviewed:    Recent Labs: No results found for requested labs within last 8760 hours.  Recent Lipid Panel    Component Value Date/Time   CHOL 129 03/07/2019 0901   TRIG 87 03/07/2019 0901   HDL 39 (L) 03/07/2019 0901   CHOLHDL 3.3 03/07/2019 0901   CHOLHDL 3.7 04/16/2015 0820   VLDL 14 04/16/2015 0820   LDLCALC 73 03/07/2019 0901    Physical Exam:    VS:  BP 130/80   Pulse 60   Ht '6\' 1"'$  (1.854 m)   Wt 263 lb 3.2 oz (119.4 kg)   SpO2 97%   BMI 34.73 kg/m     Wt Readings from Last 3 Encounters:  10/09/20 263 lb 3.2 oz (119.4 kg)  08/19/20 262 lb (118.8 kg)  08/05/20 262 lb 12.8 oz (119.2 kg)     GEN:  Well nourished, well developed in no acute distress HEENT: Normal NECK: No JVD; No carotid bruits CARDIAC: RRR, no murmurs, rubs, gallops RESPIRATORY:  Clear to auscultation without rales, wheezing or rhonchi  ABDOMEN: Soft, non-tender, non-distended MUSCULOSKELETAL: No edema; No deformity  SKIN: Warm and dry NEUROLOGIC:  Alert and oriented PSYCHIATRIC:  Normal affect   ASSESSMENT AND PLAN    Chronic angina -Overall improving with weather change -Prefer optimizing antianginal therapy before discussing repeat left heart catheterization. -Increase Norvasc to 5 mg every morning.  To avoid  hypotension, decrease losartan to 12.'5mg'$  at nighttime. -Increase metoprolol tartrate to 37.5 mg twice daily.  With current baseline heart rate of 60, will monitor for bradycardia. -Follow-up in 6 weeks to reassess angina burden.  CAD -Continue aspirin, statin and beta-blocker therapy.  Hypertension, well controlled - Medications as above. - Goal BP is <130/80.  Recommend DASH diet (high in vegetables, fruits, low-fat dairy products, whole grains, poultry, fish, and nuts and low in sweets, sugar-sweetened beverages, and red meats), salt restriction and increase physical activity.  Hyperlipidemia -LDL 78 in June 2022.  Patient is on Lipitor 80 mg daily. -Add Zetia 10 mg daily. -Discussed cholesterol lowering diets - Mediterranean diet, DASH diet, vegetarian diet, low-carbohydrate diet and avoidance of trans fats.  Discussed healthier choice substitutes.  Nuts, high-fiber foods, and fiber supplements may also improve lipids.    Disposition - Follow-up in 6 weeks to assess angina and lipids.      Medication Adjustments/Labs and Tests Ordered: Current medicines are reviewed at length with the patient today.  Concerns regarding medicines are outlined above.  Orders Placed This Encounter  Procedures   Lipid panel   Meds ordered this encounter  Medications   amLODipine (NORVASC) 5 MG tablet    Sig: Take 1 tablet (5 mg total) by mouth daily.    Dispense:  45 tablet    Refill:  2   ezetimibe (ZETIA) 10 MG tablet    Sig: Take 1 tablet (10 mg total) by mouth daily.    Dispense:  90 tablet    Refill:  3   metoprolol tartrate (LOPRESSOR) 25 MG tablet    Sig: Take 1.5 tablets (37.5 mg total) by mouth 2 (two) times daily.    Dispense:  60 tablet    Refill:  0   losartan (COZAAR) 25 MG tablet    Sig: Take 0.5 tablets (12.5 mg total) by mouth daily. Take in the evening    Dispense:  135 tablet    Refill:  1    Patient Instructions  Medication Instructions:  Start Zetia 10 mg (1 Tablet  Daily). Increase Amlodipine 5 mg ( 1 Tablet Daily). Increase Metoprolol 37.5 mg (1.5 Tablet Twice  Daily). Decrease Losartan 12.5 mg ( 0.5 Tablets Daily). *If you need a refill on your cardiac medications before your next appointment, please call your pharmacy*   Lab Work: Lipid Panel: ( To Be Done In 6 Weeks). If you have labs (blood work) drawn today and your tests are completely normal, you will receive your results only by: [place] (if you have MyChart) OR A paper copy in the mail If you have any lab test that is abnormal or we need to change your treatment, we will call you to review the results.   Testing/Procedures: No Testing    Follow-Up: At [facility], you and your health needs are our priority.  As part of our continuing mission to provide you with exceptional heart care, we have created designated Provider Care Teams.  These Care Teams include your primary Cardiologist (physician) and Advanced Practice Providers (APPs -  Physician Assistants and Nurse Practitioners) who all work together to provide you with the care you need, when you need it.  Your next appointment:   6 week(s)  The format for your next appointment:   In Person  Provider:   Caron Presume, PA-C       Signed, Warren Lacy, PA-C  10/09/2020 11:31 AM    [place]

## 2020-10-09 ENCOUNTER — Ambulatory Visit (INDEPENDENT_AMBULATORY_CARE_PROVIDER_SITE_OTHER): Payer: PPO | Admitting: Physician Assistant

## 2020-10-09 ENCOUNTER — Encounter: Payer: Self-pay | Admitting: Physician Assistant

## 2020-10-09 ENCOUNTER — Other Ambulatory Visit: Payer: Self-pay

## 2020-10-09 VITALS — BP 130/80 | HR 60 | Ht 73.0 in | Wt 263.2 lb

## 2020-10-09 DIAGNOSIS — R072 Precordial pain: Secondary | ICD-10-CM

## 2020-10-09 DIAGNOSIS — E785 Hyperlipidemia, unspecified: Secondary | ICD-10-CM | POA: Diagnosis not present

## 2020-10-09 DIAGNOSIS — I10 Essential (primary) hypertension: Secondary | ICD-10-CM | POA: Diagnosis not present

## 2020-10-09 DIAGNOSIS — I2510 Atherosclerotic heart disease of native coronary artery without angina pectoris: Secondary | ICD-10-CM | POA: Diagnosis not present

## 2020-10-09 MED ORDER — EZETIMIBE 10 MG PO TABS: 10.0000 mg | ORAL_TABLET | Freq: Every day | ORAL | 3 refills | Status: DC

## 2020-10-09 MED ORDER — LOSARTAN POTASSIUM 25 MG PO TABS
12.5000 mg | ORAL_TABLET | Freq: Every day | ORAL | 1 refills | Status: DC
Start: 2020-10-09 — End: 2021-12-15

## 2020-10-09 MED ORDER — AMLODIPINE BESYLATE 5 MG PO TABS
5.0000 mg | ORAL_TABLET | Freq: Every day | ORAL | 2 refills | Status: DC
Start: 2020-10-09 — End: 2021-10-20

## 2020-10-09 MED ORDER — METOPROLOL TARTRATE 25 MG PO TABS: 37.5000 mg | ORAL_TABLET | Freq: Two times a day (BID) | ORAL | 0 refills | Status: DC

## 2020-10-09 NOTE — Patient Instructions (Addendum)
Medication Instructions:  Start Zetia 10 mg (1 Tablet Daily). Increase Amlodipine 5 mg ( 1 Tablet Daily). Increase Metoprolol 37.5 mg (1.5 Tablet Twice  Daily). Decrease Losartan 12.5 mg ( 0.5 Tablets Daily). *If you need a refill on your cardiac medications before your next appointment, please call your pharmacy*   Lab Work: Lipid Panel: ( To Be Done In 6 Weeks). If you have labs (blood work) drawn today and your tests are completely normal, you will receive your results only by: [place] (if you have MyChart) OR A paper copy in the mail If you have any lab test that is abnormal or we need to change your treatment, we will call you to review the results.   Testing/Procedures: No Testing    Follow-Up: At [facility], you and your health needs are our priority.  As part of our continuing mission to provide you with exceptional heart care, we have created designated Provider Care Teams.  These Care Teams include your primary Cardiologist (physician) and Advanced Practice Providers (APPs -  Physician Assistants and Nurse Practitioners) who all work together to provide you with the care you need, when you need it.  Your next appointment:   6 week(s)  The format for your next appointment:   In Person  Provider:   Caron Presume, PA-C

## 2020-10-13 ENCOUNTER — Other Ambulatory Visit: Payer: Self-pay | Admitting: Cardiovascular Disease

## 2020-10-24 DIAGNOSIS — Z87891 Personal history of nicotine dependence: Secondary | ICD-10-CM | POA: Diagnosis not present

## 2020-10-24 DIAGNOSIS — I10 Essential (primary) hypertension: Secondary | ICD-10-CM | POA: Diagnosis not present

## 2020-10-24 DIAGNOSIS — I2510 Atherosclerotic heart disease of native coronary artery without angina pectoris: Secondary | ICD-10-CM | POA: Diagnosis not present

## 2020-10-24 DIAGNOSIS — M05741 Rheumatoid arthritis with rheumatoid factor of right hand without organ or systems involvement: Secondary | ICD-10-CM | POA: Diagnosis not present

## 2020-11-03 MED ORDER — FUROSEMIDE 20 MG PO TABS: 20.0000 mg | ORAL_TABLET | Freq: Every day | ORAL | 3 refills | Status: DC

## 2020-11-17 DIAGNOSIS — I10 Essential (primary) hypertension: Secondary | ICD-10-CM | POA: Diagnosis not present

## 2020-11-17 DIAGNOSIS — E785 Hyperlipidemia, unspecified: Secondary | ICD-10-CM | POA: Diagnosis not present

## 2020-11-17 DIAGNOSIS — R072 Precordial pain: Secondary | ICD-10-CM | POA: Diagnosis not present

## 2020-11-17 DIAGNOSIS — I2510 Atherosclerotic heart disease of native coronary artery without angina pectoris: Secondary | ICD-10-CM | POA: Diagnosis not present

## 2020-11-18 LAB — LIPID PANEL
Chol/HDL Ratio: 3.4 ratio (ref 0.0–5.0)
Cholesterol, Total: 102 mg/dL (ref 100–199)
HDL: 30 mg/dL — ABNORMAL LOW (ref >39)
LDL Chol Calc (NIH): 49 mg/dL (ref 0–99)
Triglycerides: 129 mg/dL (ref 0–149)
VLDL Cholesterol Cal: 23 mg/dL (ref 5–40)

## 2020-11-18 NOTE — Progress Notes (Signed)
Cardiology Office Note:    Date:  11/19/2020   ID:  Richard Simpson, DOB 1970/08/30, MRN [id number]  PCP:  [facility] [place] Cardiologist: [name], MD   Reason for visit: 6-week follow-up.  History of Present Illness:    Richard Simpson is a 50 y.o. male with a hx of CAD s/p DES to LAD in June 2016, anxiety, depression, HLD, HTN, GERD, interstitial lung disease, ICM, RA, restless leg syndrome, and obstructive sleep apnea.  LHC February 2017 for recurrent chest pain and found to have widely patent LAD stent.  Etiology for angina was felt to be microvascular disease versus diastolic dysfunction.  He had another cardiac catheterization in October 2018 for chest pain which again showed patent LAD with no significant residual disease.  The chest pain was nitro responsive.  Last echocardiogram obtained on 03/07/2019 showed EF 65 to 70%, grade 1 DD, normal PASP, trivial AI.  He underwent a repeat cardiac catheterization on 04/06/2019 that showed widely patent proximal LAD stent, 35% lesion in mid LAD, no other significant coronary artery disease.  Right heart pressure was normal.  LVEDP was normal.  His chest discomfort and shortness of breath was felt to be related to pulmonary disease.  Medical therapy was recommended.  He is being closely followed by pulmonology service for interstitial lung disease.  He last saw me in October 09, 2020 and mentioned anginal episodes approximately 3 times per week (squeezing chest pain radiating to his back with associated SOB, better with nitro).  We increase his Norvasc to 5 mg every morning.  To avoid hypotension we decreased his losartan to 12.5 mg at nighttime.  We also increased his metoprolol tartrate to 37.5 mg twice daily.  We also added Zetia with LDL over 70.  Today, he states he is doing better.  He walked about half a block uphill the other day with no chest pain.  He still feels some shortness of breath with moderate  exertion.  He has not needed to take nitroglycerin since his last visit.  He has had 2-3 episodes in the last 6 weeks where he feels a wave of lightheadedness/clamminess, but none in the last 2 to 3 weeks & this is less than previously.  He thinks he feels better overall with our medication changes.  His primary complaint is fatigue which he does think is worse.  He feels like he sleeps well with his CPAP.  He has not been going to the gym as much lately.  He is still remodeling his kids house.  He has had 2 children get married over the last 6 weeks.  He states he is committed to lose 20 to 30 pounds in the next 3 months.  He is retired Engineer, structural and plans to do more fishing and hunting the next couple months.     Past Medical History:  Diagnosis Date   Anxiety    CAD S/P percutaneous coronary angioplasty 10/2013   a. 10/2013 Cath: mod LAD dzs->nl FFR-> medically managed;  b. 06/2014 NSTEMI/PCi: LAD 99ost, 70p, 62m (entire area covered with a 3.5x20 Synergy DES), LCX nl, RCA 61m, 10d, EF 45-50%. c. 10/2015: cath showing patent LAD stent with 15% Prox LAD stenosis. EF 55-65%.    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 06/2014 EF 45-50% by LV gram b. Echo July 2016: EF 60-65%. No RWMA. Gr 1 DD    NSTEMI (non-ST elevated myocardial infarction) ([place]) 06/2014   Obesity (BMI 30-39.9) 10/29/2013   Primary snoring 10/29/2013   Rheumatoid arthritis ([place]) 09/23/2013   RLS (restless legs syndrome) 10/29/2013   Sleep apnea     Past Surgical History:  Procedure Laterality Date   BIOPSY  09/27/2018   Procedure: BIOPSY;  Surgeon: Daneil Dolin, MD;  [facility];  Service: Endoscopy;;    CARDIAC CATHETERIZATION N/A 07/05/2014   Procedure: Left Heart Cath and Coronary Angiography;  Surgeon: Troy Sine, MD;  Location: [place] [facility];  Service: Cardiovascular;  NSTEMI --> Ostial LAd 99%, prox 80% & early mid 50% --> PCI   CARDIAC CATHETERIZATION  07/05/2014   Procedure: Coronary Stent Intervention;  Surgeon: Troy Sine, MD;  Location: [place] [facility];  Service: Cardiovascular; Ostial-prox LAD tandem 99, 80 & 50% --> Synergy DES 3.5 x 20 (3.74)   CARDIAC CATHETERIZATION N/A 03/24/2015   Procedure: Left Heart Cath and Coronary Angiography;  Surgeon: Leonie Man, MD;  Location: [place] [facility];  Service: Cardiovascular;  Laterality: N/A;   CARDIAC CATHETERIZATION N/A 11/20/2015   Procedure: Left Heart Cath and Coronary Angiography;  Surgeon: Troy Sine, MD;  Location: [place] [facility];  Service: Cardiovascular;  Laterality: N/A;   CHOLECYSTECTOMY     10 yrs ago-jenkins   COLONOSCOPY N/A 03/23/2013   Procedure: COLONOSCOPY;  Surgeon: Daneil Dolin, MD;  [facility];  Service: Endoscopy;  Laterality: N/A;  2:15-moved to 1030 Staff notified pt   COLONOSCOPY WITH PROPOFOL N/A 06/10/2016   Procedure: COLONOSCOPY WITH PROPOFOL;  Surgeon: Daneil Dolin, MD;  [facility];  Service: Endoscopy;  Laterality: N/A;  730    ESOPHAGOGASTRODUODENOSCOPY (EGD) WITH PROPOFOL N/A 09/27/2018   Procedure: ESOPHAGOGASTRODUODENOSCOPY (EGD) WITH PROPOFOL;  Surgeon: Daneil Dolin, MD;  [facility];  Service: Endoscopy;  Laterality: N/A;  1:30pm   FRACTIONAL FLOW RESERVE WIRE  11/23/2013   Procedure: FRACTIONAL FLOW RESERVE WIRE;  Surgeon: Jettie Booze, MD;  Location: [facility] C[facility] LAB;  Service: Cardiovascular;;   GALLBLADDER SURGERY     LEFT HEART CATHETERIZATION WITH CORONARY ANGIOGRAM N/A 11/23/2013   Procedure: LEFT HEART CATHETERIZATION WITH CORONARY ANGIOGRAM;  Surgeon: Jettie Booze, MD;  Location: [facility] CATH LAB;  Service:  Cardiovascular;  Laterality: N/A;   MASS EXCISION  09/09/2010   Procedure: EXCISION MASS;  Surgeon: Arther Abbott, MD;  Location: [facility];  Service: Orthopedics;  Laterality: Left;  Excision Mass Left Long Finger   POLYPECTOMY  06/10/2016   Procedure: POLYPECTOMY;  Surgeon: Daneil Dolin, MD;  [facility];  Service: Endoscopy;;  colon   RIGHT/LEFT HEART CATH AND CORONARY ANGIOGRAPHY N/A 04/06/2019   Procedure: RIGHT/LEFT HEART CATH AND CORONARY ANGIOGRAPHY;  Surgeon: Martinique, Peter M, MD;  Location: [place] [facility];  Service: Cardiovascular;  Laterality: N/A;   THROAT SURGERY     nodules removed from throat as child   TRANSTHORACIC ECHOCARDIOGRAM   July 2016:    EF 60-65%. No RWMA. Gr 1 DD    Current Medications: Current Meds  Medication Sig   albuterol (PROAIR HFA) 108 (90 Base) MCG/ACT inhaler  Inhale 2 puffs into the lungs every 6 (six) hours as needed for wheezing or shortness of breath.   amLODipine (NORVASC) 5 MG tablet Take 1 tablet (5 mg total) by mouth daily.   amoxicillin-clavulanate (AUGMENTIN) 875-125 MG tablet Take 1 tablet by mouth 2 (two) times daily.   aspirin EC 81 MG tablet Take 1 tablet (81 mg total) by mouth daily.   atorvastatin (LIPITOR) 80 MG tablet TAKE (1) TABLET BY MOUTH ONCE DAILY.   clonazePAM (KLONOPIN) 1 MG tablet Take 1 mg by mouth 3 (three) times daily as needed for anxiety. Take 1mg  tabletby mouth scheduled at bedtime, may take an additional dose if needed for anxiety   clopidogrel (PLAVIX) 75 MG tablet TAKE 1 TABLET BY MOUTH ONCE A DAY.   ezetimibe (ZETIA) 10 MG tablet Take 1 tablet (10 mg total) by mouth daily.   furosemide (LASIX) 20 MG tablet Take 1 tablet (20 mg total) by mouth daily.   loratadine (CLARITIN) 10 MG tablet Take 10 mg by mouth daily.    losartan (COZAAR) 25 MG tablet Take 0.5 tablets (12.5 mg total) by mouth daily. Take in the evening   methylPREDNISolone (MEDROL) 4 MG tablet Take 4 mg by mouth daily.    metoprolol tartrate  (LOPRESSOR) 25 MG tablet Take 1.5 tablets (37.5 mg total) by mouth 2 (two) times daily.   metoprolol tartrate (LOPRESSOR) 25 MG tablet Take 25 mg by mouth 2 (two) times daily. Take 1 Tablet Twice Daily   mycophenolate (CELLCEPT) 500 MG tablet Take 1,000 mg by mouth 2 (two) times daily.    nitroGLYCERIN (NITROSTAT) 0.4 MG SL tablet DISSOLVE 1 TABLET UNDER TONGUE FOR CHEST TIGHTNESS AND SHORTNESS OF BREATH. MAY TAKE EVERY 5 MINUTES UP TO 3 DOSES. IF NO RELIEF AFTER 3 DOS (Patient taking differently: Place 0.4 mg under the tongue every 5 (five) minutes x 3 doses as needed for chest pain.)   pantoprazole (PROTONIX) 40 MG tablet TAKE 1 TABLET BY MOUTH ONCE A DAY.   traMADol (ULTRAM) 50 MG tablet Take 50 mg by mouth every 6 (six) hours as needed for moderate pain.      Allergies:   Lisinopril and Prednisone   Social History   Socioeconomic History   Marital status: Married    Spouse name: Renee   Number of children: 2   Years of education: 12th grade   Highest education level: Not on file  Occupational History   Occupation: Retired Chemical engineer: [employer]  Tobacco Use   Smoking status: Former    Packs/day: 0.25    Years: 15.00    Pack years: 3.75    Types: Cigarettes    Quit date: 09/04/2003    Years since quitting: 17.2   Smokeless tobacco: Current    Types: Chew  Vaping Use   Vaping Use: Never used  Substance and Sexual Activity   Alcohol use: Yes    Alcohol/week: 1.0 - 2.0 standard drink    Types: 1 - 2 Cans of beer per week    Comment: rare   Drug use: No   Sexual activity: Yes  Other Topics Concern   Not on file  Social History Narrative   Patient lives at home with his wife [name]).    Patient works full time Dentist..   Education high school.   Right handed.   Caffeine one cup of coffee daily and one soda.   Social Determinants of Health   Financial  Resource Strain: Not on file  Food Insecurity: Not on file  Transportation  Needs: Not on file  Physical Activity: Not on file  Stress: Not on file  Social Connections: Not on file     Family History: The patient's family history includes Arthritis in his paternal uncle; Cancer in his maternal grandfather; Diabetes in his maternal grandmother, mother, paternal grandfather, and other family members; Hypertension in his maternal grandmother and mother. There is no history of Anesthesia problems, Hypotension, Malignant hyperthermia, Pseudochol deficiency, Heart attack, or Stroke.  ROS:   Please see the history of present illness.     EKGs/Labs/Other Studies Reviewed:    Recent Labs: No results found for requested labs within last 8760 hours.   Recent Lipid Panel Lab Results  Component Value Date/Time   CHOL 102 11/17/2020 08:19 AM   TRIG 129 11/17/2020 08:19 AM   HDL 30 (L) 11/17/2020 08:19 AM   LDLCALC 49 11/17/2020 08:19 AM    Physical Exam:    VS:  BP 118/82 (BP Location: Left Arm, Patient Position: Sitting, Cuff Size: Large)   Pulse 64   Ht 6\' 1"  (1.854 m)   Wt 263 lb (119.3 kg)   BMI 34.70 kg/m    No data found.  Wt Readings from Last 3 Encounters:  11/19/20 263 lb (119.3 kg)  10/09/20 263 lb 3.2 oz (119.4 kg)  08/19/20 262 lb (118.8 kg)     GEN:  Well nourished, well developed in no acute distress, muscular  HEENT: Normal NECK: No JVD; No carotid bruits CARDIAC: RRR, no murmurs, rubs, gallops RESPIRATORY:  Clear to auscultation without rales, wheezing or rhonchi  ABDOMEN: Soft, non-tender, non-distended MUSCULOSKELETAL: No edema; No deformity  SKIN: Warm and dry NEUROLOGIC:  Alert and oriented PSYCHIATRIC:  Normal affect     ASSESSMENT AND PLAN   Chronic angina, improved -Decrease his metoprolol tartrate back to 25 mg twice daily given worsened fatigue. -Continue Norvasc 5 mg daily. -History of headache dizziness with Imdur in 2017.   CAD -Continue aspirin, statin and beta-blocker therapy.   Hypertension, well  controlled -Medications as above. -Goal BP is <130/80.  Recommend DASH diet, salt restriction and increase physical activity.   Hyperlipidemia, now controlled -LDL 49 in 10/2020 after adding Zetia.  Continue Lipitor and Zetia.  Obesity -BMI 34.  His goal is to lose 20 to 30 pounds in the next 3 months.  He plans to get back to the gym more regularly.   Disposition - Follow-up in to assess angina burden, fatigue and weight loss efforts.       Medication Adjustments/Labs and Tests Ordered: Current medicines are reviewed at length with the patient today.  Concerns regarding medicines are outlined above.  No orders of the defined types were placed in this encounter.  No orders of the defined types were placed in this encounter.   Patient Instructions  Medication Instructions:  Decrease Metoprolol 37.5 mg to 25 mg (1 Tablet Twice Daily). *If you need a refill on your cardiac medications before your next appointment, please call your pharmacy*   Lab Work: No Labs If you have labs (blood work) drawn today and your tests are completely normal, you will receive your results only by: [place] (if you have MyChart) OR A paper copy in the mail If you have any lab test that is abnormal or we need to change your treatment, we will call you to review the results.   Testing/Procedures: No Testing   Follow-Up: At [facility], you and your health needs are our priority.  As part of our continuing mission to provide you with exceptional heart care, we have created designated Provider Care Teams.  These Care Teams include your primary Cardiologist (physician) and Advanced Practice Providers (APPs -  Physician Assistants and Nurse Practitioners) who all work together to provide you with the care you need, when you need it.  We recommend signing up for the patient portal called "MyChart".  Sign up information is provided on this After Visit Summary.  MyChart is used to connect with  patients for Virtual Visits (Telemedicine).  Patients are able to view lab/test results, encounter notes, upcoming appointments, etc.  Non-urgent messages can be sent to your provider as well.   To learn more about what you can do with MyChart, go to [email or web address].    Your next appointment:   3 month(s)  The format for your next appointment:   In Person  Provider:   Caron Presume, PA-C      Signed, Warren Lacy, PA-C  11/19/2020 11:07 AM    [place]

## 2020-11-19 ENCOUNTER — Ambulatory Visit (INDEPENDENT_AMBULATORY_CARE_PROVIDER_SITE_OTHER): Payer: PPO | Admitting: Physician Assistant

## 2020-11-19 ENCOUNTER — Telehealth: Payer: Self-pay

## 2020-11-19 ENCOUNTER — Other Ambulatory Visit: Payer: Self-pay

## 2020-11-19 ENCOUNTER — Encounter: Payer: Self-pay | Admitting: Physician Assistant

## 2020-11-19 VITALS — BP 118/82 | HR 64 | Ht 73.0 in | Wt 263.0 lb

## 2020-11-19 DIAGNOSIS — I25119 Atherosclerotic heart disease of native coronary artery with unspecified angina pectoris: Secondary | ICD-10-CM

## 2020-11-19 DIAGNOSIS — I10 Essential (primary) hypertension: Secondary | ICD-10-CM | POA: Diagnosis not present

## 2020-11-19 DIAGNOSIS — E785 Hyperlipidemia, unspecified: Secondary | ICD-10-CM | POA: Diagnosis not present

## 2020-11-19 DIAGNOSIS — R072 Precordial pain: Secondary | ICD-10-CM | POA: Diagnosis not present

## 2020-11-19 NOTE — Telephone Encounter (Addendum)
Called patient regarding Lab results----- Message from Warren Lacy, PA-C sent at 11/18/2020  3:45 PM EDT ----- Your bad cholesterol (LDL) has improved to 49.  This is awesome!  Zetia has helped bring your LDL to goal now (<70).  Continue current meds.

## 2020-11-19 NOTE — Patient Instructions (Signed)
Medication Instructions:  Decrease Metoprolol 37.5 mg to 25 mg (1 Tablet Twice Daily). *If you need a refill on your cardiac medications before your next appointment, please call your pharmacy*   Lab Work: No Labs If you have labs (blood work) drawn today and your tests are completely normal, you will receive your results only by: [place] (if you have MyChart) OR A paper copy in the mail If you have any lab test that is abnormal or we need to change your treatment, we will call you to review the results.   Testing/Procedures: No Testing   Follow-Up: At [facility], you and your health needs are our priority.  As part of our continuing mission to provide you with exceptional heart care, we have created designated Provider Care Teams.  These Care Teams include your primary Cardiologist (physician) and Advanced Practice Providers (APPs -  Physician Assistants and Nurse Practitioners) who all work together to provide you with the care you need, when you need it.  We recommend signing up for the patient portal called "MyChart".  Sign up information is provided on this After Visit Summary.  MyChart is used to connect with patients for Virtual Visits (Telemedicine).  Patients are able to view lab/test results, encounter notes, upcoming appointments, etc.  Non-urgent messages can be sent to your provider as well.   To learn more about what you can do with MyChart, go to [email or web address].    Your next appointment:   3 month(s)  The format for your next appointment:   In Person  Provider:   Caron Presume, PA-C

## 2020-11-20 DIAGNOSIS — J849 Interstitial pulmonary disease, unspecified: Secondary | ICD-10-CM | POA: Diagnosis not present

## 2020-11-20 DIAGNOSIS — I2510 Atherosclerotic heart disease of native coronary artery without angina pectoris: Secondary | ICD-10-CM | POA: Diagnosis not present

## 2020-11-20 DIAGNOSIS — M0609 Rheumatoid arthritis without rheumatoid factor, multiple sites: Secondary | ICD-10-CM | POA: Diagnosis not present

## 2020-11-20 DIAGNOSIS — M5440 Lumbago with sciatica, unspecified side: Secondary | ICD-10-CM | POA: Diagnosis not present

## 2020-11-20 DIAGNOSIS — E669 Obesity, unspecified: Secondary | ICD-10-CM | POA: Diagnosis not present

## 2020-11-20 DIAGNOSIS — Z6835 Body mass index (BMI) 35.0-35.9, adult: Secondary | ICD-10-CM | POA: Diagnosis not present

## 2020-11-26 ENCOUNTER — Encounter: Payer: Self-pay | Admitting: Internal Medicine

## 2020-11-26 ENCOUNTER — Other Ambulatory Visit: Payer: Self-pay | Admitting: Gastroenterology

## 2020-11-26 NOTE — Telephone Encounter (Signed)
Patient has not been seen since August 2020.  He needs an office visit for additional refills as previously recommended.

## 2020-11-27 NOTE — Telephone Encounter (Signed)
LMOM for pt to call office back 

## 2020-12-10 ENCOUNTER — Telehealth: Payer: Self-pay | Admitting: Internal Medicine

## 2020-12-10 ENCOUNTER — Other Ambulatory Visit: Payer: Self-pay | Admitting: Cardiovascular Disease

## 2020-12-10 ENCOUNTER — Other Ambulatory Visit: Payer: Self-pay | Admitting: *Deleted

## 2020-12-10 MED ORDER — NITROGLYCERIN 0.4 MG SL SUBL: SUBLINGUAL_TABLET | SUBLINGUAL | 0 refills | Status: AC

## 2020-12-10 MED ORDER — PANTOPRAZOLE SODIUM 40 MG PO TBEC: 40.0000 mg | DELAYED_RELEASE_TABLET | Freq: Every day | ORAL | 3 refills | Status: DC

## 2020-12-10 NOTE — Telephone Encounter (Signed)
Patient called me last evening.  Ran out of Protonix.  Thought he can get by without it.  Definitely had issues with reflux recurring.  No dysphagia.  We will go ahead and refill his Protonix 40 mg tablet 1 daily.  Dispense 90 with 3 refills.  He needs an office visit with me in 1 year.  No further refills until he is seen.

## 2020-12-10 NOTE — Telephone Encounter (Signed)
Called pt and informed him that RX has been sent.

## 2020-12-10 NOTE — Telephone Encounter (Signed)
Sent RX to pharmacy for pt.  Tried to call to inform pt but had to leave a voice mail for him to call me back.    Stacey Southard:  Can you NIC for 1 year follow up with Dr. Gala Romney please?

## 2020-12-11 NOTE — Progress Notes (Signed)
Cardiology Office Note   Date:  12/12/2020   ID:  Richard Simpson, DOB [birth year], MRN [id number]  PCP:  [facility]    No chief complaint on file.  CAD  Wt Readings from Last 3 Encounters:  11/19/20 263 lb (119.3 kg)  10/09/20 263 lb 3.2 oz (119.4 kg)  08/19/20 262 lb (118.8 kg)       History of Present Illness: Richard Simpson is a 50 y.o. male  with prior MI, LAD stent.    Last cath in 2021: "Previously placed Ost LAD to Prox LAD stent (unknown type) is widely patent. Prox LAD to Mid LAD lesion is 35% stenosed. LV end diastolic pressure is normal. LV end diastolic pressure is normal.   1. Nonobstructive CAD. Prior stent in the proximal LAD is widely patent 2. Normal LV filling pressures 3. Normal right heart pressures 4. Normal cardiac output   Plan: based on these findings I suspect his symptoms are more related to his pulmonary disease. Continue medical management. "  Over the past several days, Some DOE and mild tightness in the chest at times.  Not related to exertion.  Not severe, no NTG use initially.  BP was in the 145 range.  Did take a NTG.  BP came down and CP improved after 30 minutes.    He has been going to the gym and doing 25 minutes on the treadmill.  Feels well doing weights. Hunting until the last week.   Different from sx before his stent in 2016.    Also diagnosed with interstitial lung disease and autoimmune arthritis.   O2 has been in the 95% range.  Normal stress test in 2022.   Past Medical History:  Diagnosis Date   Anxiety    CAD S/P percutaneous coronary angioplasty 10/2013   a. 10/2013 Cath: mod LAD dzs->nl FFR-> medically managed;  b. 06/2014 NSTEMI/PCi: LAD 99ost, 70p, 30m (entire area covered with a 3.5x20 Synergy DES), LCX nl, RCA 18m, 10d, EF 45-50%. c. 10/2015: cath showing patent LAD stent with 15% Prox LAD stenosis. EF 55-65%.    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 06/2014 EF 45-50% by LV gram b. Echo July 2016: EF 60-65%. No RWMA. Gr 1 DD    NSTEMI (non-ST elevated myocardial infarction) ([place]) 06/2014   Obesity (BMI 30-39.9) 10/29/2013   Primary snoring 10/29/2013   Rheumatoid arthritis ([place]) 09/23/2013   RLS (restless legs syndrome) 10/29/2013   Sleep apnea     Past Surgical History:  Procedure Laterality Date   BIOPSY  09/27/2018   Procedure: BIOPSY;  Surgeon: Daneil Dolin, MD;  [facility];  Service: Endoscopy;;   CARDIAC CATHETERIZATION N/A 07/05/2014   Procedure: Left Heart Cath and Coronary Angiography;  Surgeon: Troy Sine, MD;  Location: [facility];  Service: Cardiovascular;  NSTEMI --> Ostial LAd 99%, prox 80% & early mid 50% --> PCI   CARDIAC CATHETERIZATION  07/05/2014   Procedure: Coronary Stent Intervention;  Surgeon: Troy Sine, MD;  Location: [place] [facility];  Service: Cardiovascular; Ostial-prox LAD tandem 99, 80 &  50% --> Synergy DES 3.5 x 20 (3.74)   CARDIAC CATHETERIZATION N/A 03/24/2015   Procedure: Left Heart Cath and Coronary Angiography;  Surgeon: Leonie Man, MD;  Location: [place] [facility];  Service: Cardiovascular;  Laterality: N/A;   CARDIAC CATHETERIZATION N/A 11/20/2015   Procedure: Left Heart Cath and Coronary Angiography;  Surgeon: Troy Sine, MD;  Location: [place] [facility];  Service: Cardiovascular;  Laterality: N/A;   CHOLECYSTECTOMY     10 yrs ago-[facility]   COLONOSCOPY N/A 03/23/2013   Procedure: COLONOSCOPY;  Surgeon: Daneil Dolin, MD;  [facility];  Service: Endoscopy;  Laterality: N/A;  2:15-moved to 1030 Staff notified pt   COLONOSCOPY WITH PROPOFOL N/A 06/10/2016   Procedure: COLONOSCOPY WITH  PROPOFOL;  Surgeon: Daneil Dolin, MD;  [facility];  Service: Endoscopy;  Laterality: N/A;  730    ESOPHAGOGASTRODUODENOSCOPY (EGD) WITH PROPOFOL N/A 09/27/2018   Procedure: ESOPHAGOGASTRODUODENOSCOPY (EGD) WITH PROPOFOL;  Surgeon: Daneil Dolin, MD;  [facility];  Service: Endoscopy;  Laterality: N/A;  1:30pm   FRACTIONAL FLOW RESERVE WIRE  11/23/2013   Procedure: FRACTIONAL FLOW RESERVE WIRE;  Surgeon: Jettie Booze, MD;  Location: [facility] C[facility] LAB;  Service: Cardiovascular;;   GALLBLADDER SURGERY     LEFT HEART CATHETERIZATION WITH CORONARY ANGIOGRAM N/A 11/23/2013   Procedure: LEFT HEART CATHETERIZATION WITH CORONARY ANGIOGRAM;  Surgeon: Jettie Booze, MD;  Location: [facility] C[facility] LAB;  Service: Cardiovascular;  Laterality: N/A;   MASS EXCISION  09/09/2010   Procedure: EXCISION MASS;  Surgeon: Arther Abbott, MD;  Location: [facility];  Service: Orthopedics;  Laterality: Left;  Excision Mass Left Long Finger   POLYPECTOMY  06/10/2016   Procedure: POLYPECTOMY;  Surgeon: Daneil Dolin, MD;  [facility];  Service: Endoscopy;;  colon   RIGHT/LEFT HEART CATH AND CORONARY ANGIOGRAPHY N/A 04/06/2019   Procedure: RIGHT/LEFT HEART CATH AND CORONARY ANGIOGRAPHY;  Surgeon: Martinique, Peter M, MD;  Location: [place] [facility];  Service: Cardiovascular;  Laterality: N/A;   THROAT SURGERY     nodules removed from throat as child   TRANSTHORACIC ECHOCARDIOGRAM   July 2016:    EF 60-65%. No RWMA. Gr 1 DD     Current Outpatient Medications  Medication Sig Dispense Refill   albuterol (PROAIR HFA) 108 (90 Base) MCG/ACT inhaler Inhale 2 puffs into the lungs every 6 (six) hours as needed for wheezing or shortness of breath. 8 g 0   amLODipine (NORVASC) 5 MG tablet Take 1 tablet (5 mg total) by mouth daily. 45 tablet 2   amoxicillin-clavulanate (AUGMENTIN) 875-125 MG tablet Take 1 tablet by mouth 2 (two) times daily.     aspirin EC 81 MG tablet Take 1 tablet (81 mg total)  by mouth daily. 30 tablet 3   atorvastatin (LIPITOR) 80 MG tablet TAKE (1) TABLET BY MOUTH ONCE DAILY. 90 tablet 3   clonazePAM (KLONOPIN) 1 MG tablet Take 1 mg by mouth 3 (three) times daily as needed for anxiety. Take 1mg  tabletby mouth scheduled at bedtime, may take an additional dose if needed for anxiety     clopidogrel (PLAVIX) 75 MG tablet TAKE 1 TABLET BY MOUTH ONCE A DAY. 90 tablet 2   ezetimibe (ZETIA) 10 MG tablet Take 1 tablet (10 mg total) by mouth daily. 90 tablet 3   furosemide (LASIX) 20 MG tablet Take 1 tablet (20 mg total) by mouth daily. 90 tablet 3   loratadine (CLARITIN) 10 MG tablet  Take 10 mg by mouth daily.      losartan (COZAAR) 25 MG tablet Take 0.5 tablets (12.5 mg total) by mouth daily. Take in the evening 135 tablet 1   methylPREDNISolone (MEDROL) 4 MG tablet Take 4 mg by mouth daily.      metoprolol tartrate (LOPRESSOR) 25 MG tablet TAKE 1 AND 1/2 TABLET BY MOUTH TWICE DAILY. 90 tablet 0   mycophenolate (CELLCEPT) 500 MG tablet Take 1,000 mg by mouth 2 (two) times daily.      nitroGLYCERIN (NITROSTAT) 0.4 MG SL tablet DISSOLVE 1 TABLET UNDER TONGUE FOR CHEST TIGHTNESS AND SHORTNESS OF BREATH. MAY TAKE EVERY 5 MINUTES UP TO 3 DOSES. IF NO RELIEF AFTER 3 DOS 25 tablet 0   pantoprazole (PROTONIX) 40 MG tablet TAKE 1 TABLET BY MOUTH ONCE A DAY. 30 tablet 5   pantoprazole (PROTONIX) 40 MG tablet Take 1 tablet (40 mg total) by mouth daily. 90 tablet 3   traMADol (ULTRAM) 50 MG tablet Take 50 mg by mouth every 6 (six) hours as needed for moderate pain.      metoprolol tartrate (LOPRESSOR) 25 MG tablet Take 25 mg by mouth 2 (two) times daily. Take 1 Tablet Twice Daily (Patient not taking: Reported on 12/12/2020)     No current facility-administered medications for this visit.    Allergies:   Lisinopril and Prednisone    Social History:  The patient  reports that he quit smoking about 17 years ago. His smoking use included cigarettes. He has a 3.75 pack-year smoking  history. His smokeless tobacco use includes chew. He reports current alcohol use of about 1.0 - 2.0 standard drink per week. He reports that he does not use drugs.   Family History:  The patient's family history includes Arthritis in his paternal uncle; Cancer in his maternal grandfather; Diabetes in his maternal grandmother, mother, paternal grandfather, and other family members; Hypertension in his maternal grandmother and mother.    ROS:  Please see the history of present illness.   Otherwise, review of systems are positive for chest pain and shortness of breath episode.   All other systems are reviewed and negative.    PHYSICAL EXAM: VS:  BP 110/64   Ht 6\' 1"  (1.854 m)   BMI 34.70 kg/m  , BMI Body mass index is 34.7 kg/m. GEN: Well nourished, well developed, in no acute distress HEENT: normal Neck: no JVD, carotid bruits, or masses Cardiac: RRR; no murmurs, rubs, or gallops,no edema  Respiratory:  clear to auscultation bilaterally, normal work of breathing GI: soft, nontender, nondistended, + BS MS: no deformity or atrophy Skin: warm and dry, no rash Neuro:  Strength and sensation are intact Psych: euthymic mood, full affect   EKG:   The ekg ordered today demonstrates NSR, no change from prior ECG in July 2022.    Recent Labs: No results found for requested labs within last 8760 hours.   Lipid Panel    Component Value Date/Time   CHOL 102 11/17/2020 0819   TRIG 129 11/17/2020 0819   HDL 30 (L) 11/17/2020 0819   CHOLHDL 3.4 11/17/2020 0819   CHOLHDL 3.7 04/16/2015 0820   VLDL 14 04/16/2015 0820   LDLCALC 49 11/17/2020 0819     Other studies Reviewed: Additional studies/ records that were reviewed today with results demonstrating: labs reeviewed.   ASSESSMENT AND PLAN:  CAD: Some chest pain. Negative stress test a few months ago.  Some DOE.  Sx different than prior angina.  Tried Imdur  in the past but had low BP.  Negative stress test in 2022.  Negative cath in  2021.   Start ranexa 500 mg BID. No change in BP as well.  HTN: The current medical regimen is effective;  continue present plan and medications.  High when he had his chest discomfort.   Hyperlipidemia: LDL 49 in 10/2020.  Continue atorvastatin.  DOE: ILD noted.  Check BNP.  COnsider diuresis if he is volume overloaded.     Current medicines are reviewed at length with the patient today.  The patient concerns regarding his medicines were addressed.  The following changes have been made:  No change  Labs/ tests ordered today include:  No orders of the defined types were placed in this encounter.   Recommend 150 minutes/week of aerobic exercise Low fat, low carb, high fiber diet recommended  Disposition:   FU as scheduled with Dr. Claiborne Billings.   Signed, Larae Grooms, MD  12/12/2020 10:35 AM    [facility] [address], [place], [place]  [address] Phone: [phone]; Fax: [phone]

## 2020-12-11 NOTE — Telephone Encounter (Signed)
Called patient about his message. Patient stated he has chest tightness that is relieved with nitroglycerin. Patient had stress test on 08/19/20 that was normal, but patient does have history of CAD. Encouraged patient to call 911 if nitro does not relieve  his chest tightness. Made patient an appointment to see DOD tomorrow morning. Patient verbalized understanding.

## 2020-12-12 ENCOUNTER — Ambulatory Visit (INDEPENDENT_AMBULATORY_CARE_PROVIDER_SITE_OTHER): Payer: PPO | Admitting: Interventional Cardiology

## 2020-12-12 ENCOUNTER — Encounter: Payer: Self-pay | Admitting: Interventional Cardiology

## 2020-12-12 ENCOUNTER — Other Ambulatory Visit: Payer: Self-pay

## 2020-12-12 VITALS — BP 110/64 | Ht 73.0 in | Wt 264.0 lb

## 2020-12-12 DIAGNOSIS — E785 Hyperlipidemia, unspecified: Secondary | ICD-10-CM

## 2020-12-12 DIAGNOSIS — I10 Essential (primary) hypertension: Secondary | ICD-10-CM

## 2020-12-12 DIAGNOSIS — I25119 Atherosclerotic heart disease of native coronary artery with unspecified angina pectoris: Secondary | ICD-10-CM | POA: Diagnosis not present

## 2020-12-12 DIAGNOSIS — J849 Interstitial pulmonary disease, unspecified: Secondary | ICD-10-CM | POA: Diagnosis not present

## 2020-12-12 DIAGNOSIS — R0600 Dyspnea, unspecified: Secondary | ICD-10-CM

## 2020-12-12 MED ORDER — RANOLAZINE ER 500 MG PO TB12: 500.0000 mg | ORAL_TABLET | Freq: Two times a day (BID) | ORAL | 6 refills | Status: DC

## 2020-12-12 NOTE — Patient Instructions (Addendum)
Medication Instructions:   Your physician has recommended you make the following change in your medication: Start Ranexa 500 mg by mouth twice daily  *If you need a refill on your cardiac medications before your next appointment, please call your pharmacy*   Lab Work:  Lab work to be done today--BNP, CMET and CBC If you have labs (blood work) drawn today and your tests are completely normal, you will receive your results only by: [place] (if you have MyChart) OR A paper copy in the mail If you have any lab test that is abnormal or we need to change your treatment, we will call you to review the results.   Testing/Procedures: none   Follow-Up: At [facility], you and your health needs are our priority.  As part of our continuing mission to provide you with exceptional heart care, we have created designated Provider Care Teams.  These Care Teams include your primary Cardiologist (physician) and Advanced Practice Providers (APPs -  Physician Assistants and Nurse Practitioners) who all work together to provide you with the care you need, when you need it.  We recommend signing up for the patient portal called "MyChart".  Sign up information is provided on this After Visit Summary.  MyChart is used to connect with patients for Virtual Visits (Telemedicine).  Patients are able to view lab/test results, encounter notes, upcoming appointments, etc.  Non-urgent messages can be sent to your provider as well.   To learn more about what you can do with MyChart, go to [email or web address].    Your next appointment:   02/18/21  The format for your next appointment:   In Person  Provider:   Caron Presume, PA-C        Other Instructions

## 2020-12-14 LAB — COMPREHENSIVE METABOLIC PANEL
ALT: 53 IU/L — ABNORMAL HIGH (ref 0–44)
AST: 32 IU/L (ref 0–40)
Albumin/Globulin Ratio: 2.2 (ref 1.2–2.2)
Albumin: 4.6 g/dL (ref 4.0–5.0)
Alkaline Phosphatase: 82 IU/L (ref 44–121)
BUN/Creatinine Ratio: 13 (ref 9–20)
BUN: 14 mg/dL (ref 6–24)
Bilirubin Total: 1.2 mg/dL (ref 0.0–1.2)
CO2: 24 mmol/L (ref 20–29)
Calcium: 9 mg/dL (ref 8.7–10.2)
Chloride: 101 mmol/L (ref 96–106)
Creatinine, Ser: 1.04 mg/dL (ref 0.76–1.27)
Globulin, Total: 2.1 g/dL (ref 1.5–4.5)
Glucose: 88 mg/dL (ref 70–99)
Potassium: 4.2 mmol/L (ref 3.5–5.2)
Sodium: 140 mmol/L (ref 134–144)
Total Protein: 6.7 g/dL (ref 6.0–8.5)
eGFR: 88 mL/min/{1.73_m2} (ref >59)

## 2020-12-14 LAB — CBC
Hematocrit: 48.3 % (ref 37.5–51.0)
Hemoglobin: 16.7 g/dL (ref 13.0–17.7)
MCH: 32.1 pg (ref 26.6–33.0)
MCHC: 34.6 g/dL (ref 31.5–35.7)
MCV: 93 fL (ref 79–97)
Platelets: 193 10*3/uL (ref 150–450)
RBC: 5.2 x10E6/uL (ref 4.14–5.80)
RDW: 11.7 % (ref 11.6–15.4)
WBC: 7.6 10*3/uL (ref 3.4–10.8)

## 2020-12-14 LAB — PRO B NATRIURETIC PEPTIDE: NT-Pro BNP: 23 pg/mL (ref 0–121)

## 2020-12-17 ENCOUNTER — Other Ambulatory Visit: Payer: Self-pay

## 2020-12-17 ENCOUNTER — Other Ambulatory Visit (HOSPITAL_COMMUNITY)
Admission: RE | Admit: 2020-12-17 | Discharge: 2020-12-17 | Disposition: A | Discharge status: Home / Self Care | Payer: PPO | Source: Ambulatory Visit | Attending: Internal Medicine | Admitting: Internal Medicine

## 2020-12-17 ENCOUNTER — Telehealth: Payer: Self-pay | Admitting: Internal Medicine

## 2020-12-17 DIAGNOSIS — R1011 Right upper quadrant pain: Secondary | ICD-10-CM

## 2020-12-17 LAB — HEPATIC FUNCTION PANEL
ALT: 48 U/L — ABNORMAL HIGH (ref 0–44)
AST: 28 U/L (ref 15–41)
Albumin: 4.4 g/dL (ref 3.5–5.0)
Alkaline Phosphatase: 68 U/L (ref 38–126)
Bilirubin, Direct: 0.3 mg/dL — ABNORMAL HIGH (ref 0.0–0.2)
Indirect Bilirubin: 1.6 mg/dL — ABNORMAL HIGH (ref 0.3–0.9)
Total Bilirubin: 1.9 mg/dL — ABNORMAL HIGH (ref 0.3–1.2)
Total Protein: 7 g/dL (ref 6.5–8.1)

## 2020-12-17 NOTE — Telephone Encounter (Signed)
Called pt. Aware of MRCP/MRI APPT FOR 12/1 arrival 6:30am, npo midnight

## 2020-12-17 NOTE — Telephone Encounter (Signed)
Patient called me today.  Right upper quadrant abdominal pain has returned over the past 1 week.  History of same over the past couple of years work-up last year unrevealing.  There was a question of a possibility of a common duct stone previously.  Symptoms settle down.  [facility] scan at [facility] failed to demonstrate a cause of his pain status postcholecystectomy.  He did have nonobstructing bilateral kidney stones on prior study.  No suspicious symptoms for kidney stones at this time.  He resumed Protonix 40 mg last week with abolition of his typical reflux symptoms but right upper quadrant pain has ensued afterwards.  Takes Biologics for RA.  Bump in ALT noted recently.  I asked him to repeat LFTs today which he did.  Mild nonspecific bump in bilirubin 1.9 ALT 48 AP and AST normal.  Gallbladder removed some 20 years ago.  Had cholecystitis  - unsure about stones.  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  He rates pain at a 6  out of 10.  Taking Tylenol regularly.  I feel the next best approach is to make sure he does not have an occult calculus in his common bile duct and at the same time images liver given the recurrent significant nature of his symptoms the best approach was felt to be an MRI/MRCP of the liver biliary tree to further evaluate.  He is supposed to have labs related to RA/meds the first of next week.  Would like to make sure that this includes a complete hepatic function profile.  This algorithm was discussed with the patient he is agreeable.  We will plan for an MRI to further investigate next week and we will follow-up on his labs.

## 2020-12-17 NOTE — Telephone Encounter (Signed)
HFP lab ordered for next week as requested. Routing to clinical pool to schedule MRI.

## 2020-12-17 NOTE — Addendum Note (Signed)
Addended by: Cheron Every on: 12/17/2020 04:51 PM   Modules accepted: Orders

## 2020-12-22 DIAGNOSIS — R1011 Right upper quadrant pain: Secondary | ICD-10-CM | POA: Diagnosis not present

## 2020-12-22 DIAGNOSIS — M0609 Rheumatoid arthritis without rheumatoid factor, multiple sites: Secondary | ICD-10-CM | POA: Diagnosis not present

## 2020-12-22 NOTE — Telephone Encounter (Signed)
Communication noted.  

## 2020-12-23 LAB — HEPATIC FUNCTION PANEL
ALT: 37 IU/L (ref 0–44)
AST: 21 IU/L (ref 0–40)
Albumin: 4.4 g/dL (ref 4.0–5.0)
Alkaline Phosphatase: 83 IU/L (ref 44–121)
Bilirubin Total: 1.1 mg/dL (ref 0.0–1.2)
Bilirubin, Direct: 0.32 mg/dL (ref 0.00–0.40)
Total Protein: 6.4 g/dL (ref 6.0–8.5)

## 2020-12-25 ENCOUNTER — Other Ambulatory Visit: Payer: Self-pay

## 2020-12-25 ENCOUNTER — Ambulatory Visit (HOSPITAL_COMMUNITY)
Admission: RE | Admit: 2020-12-25 | Discharge: 2020-12-25 | Disposition: A | Discharge status: Home / Self Care | Payer: PPO | Source: Ambulatory Visit | Attending: Internal Medicine | Admitting: Internal Medicine

## 2020-12-25 ENCOUNTER — Other Ambulatory Visit: Payer: Self-pay | Admitting: Internal Medicine

## 2020-12-25 DIAGNOSIS — R1011 Right upper quadrant pain: Secondary | ICD-10-CM

## 2020-12-25 DIAGNOSIS — K8689 Other specified diseases of pancreas: Secondary | ICD-10-CM | POA: Diagnosis not present

## 2020-12-25 DIAGNOSIS — R600 Localized edema: Secondary | ICD-10-CM | POA: Diagnosis not present

## 2020-12-25 DIAGNOSIS — N281 Cyst of kidney, acquired: Secondary | ICD-10-CM | POA: Diagnosis not present

## 2020-12-25 DIAGNOSIS — K760 Fatty (change of) liver, not elsewhere classified: Secondary | ICD-10-CM | POA: Diagnosis not present

## 2020-12-25 MED ORDER — GADOBUTROL 1 MMOL/ML IV SOLN
10.0000 mL | Freq: Once | INTRAVENOUS | Status: AC | PRN
  Administered 2020-12-25: 10 mL via INTRAVENOUS

## 2020-12-27 ENCOUNTER — Telehealth: Payer: Self-pay | Admitting: Internal Medicine

## 2020-12-27 NOTE — Telephone Encounter (Signed)
Reviewed MRI results with patient.  No biliary obstruction or filling defect.  Possible equivocal edema of the pancreas.  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  Patient continues to have a waxing and waning abdominal pain as previously described not particularly dissimilar from symptoms when she was seen 3 years ago.  His LFTs have reverted back to completely normal.  Unsure if subtle pancreatic abnormality and his symptoms have anything to do with 1 another.  Cannot rule out sphincter of Oddi spasm at this time.  His pain is 5 out of 10 when extreme.  Most the time is not that severe.  Not affected with eating or have having a bowel movement.  No fever.  He is going about his activities of daily living to.  He really needs a face-to-face office visit for further evaluation to decide what the next step would be an in management.  For now, called and left 7.125 mg tablets 1 sublingually up to 4 times daily as needed and abdominal discomfort.  #30 to [place].  We will get him back to the office in the near future to just reassess.  He has not had specific labs to evaluate the pancreas.  Will see if that needs to be done at the time of his next office visit.

## 2020-12-29 ENCOUNTER — Encounter: Payer: Self-pay | Admitting: Internal Medicine

## 2020-12-29 NOTE — Telephone Encounter (Signed)
Communication noted. Will route to front for pt to be scheduled a f/u appt as recommended.

## 2021-01-08 ENCOUNTER — Other Ambulatory Visit: Payer: Self-pay | Admitting: Cardiovascular Disease

## 2021-01-15 DIAGNOSIS — C44519 Basal cell carcinoma of skin of other part of trunk: Secondary | ICD-10-CM | POA: Diagnosis not present

## 2021-01-15 DIAGNOSIS — D485 Neoplasm of uncertain behavior of skin: Secondary | ICD-10-CM | POA: Diagnosis not present

## 2021-01-15 DIAGNOSIS — L570 Actinic keratosis: Secondary | ICD-10-CM | POA: Diagnosis not present

## 2021-01-15 DIAGNOSIS — C44612 Basal cell carcinoma of skin of right upper limb, including shoulder: Secondary | ICD-10-CM | POA: Diagnosis not present

## 2021-01-16 DIAGNOSIS — E7849 Other hyperlipidemia: Secondary | ICD-10-CM | POA: Diagnosis not present

## 2021-01-16 DIAGNOSIS — R7301 Impaired fasting glucose: Secondary | ICD-10-CM | POA: Diagnosis not present

## 2021-01-16 DIAGNOSIS — K219 Gastro-esophageal reflux disease without esophagitis: Secondary | ICD-10-CM | POA: Diagnosis not present

## 2021-01-16 DIAGNOSIS — E782 Mixed hyperlipidemia: Secondary | ICD-10-CM | POA: Diagnosis not present

## 2021-01-16 DIAGNOSIS — I10 Essential (primary) hypertension: Secondary | ICD-10-CM | POA: Diagnosis not present

## 2021-01-16 DIAGNOSIS — Z1329 Encounter for screening for other suspected endocrine disorder: Secondary | ICD-10-CM | POA: Diagnosis not present

## 2021-01-19 DIAGNOSIS — E7849 Other hyperlipidemia: Secondary | ICD-10-CM | POA: Diagnosis not present

## 2021-01-19 DIAGNOSIS — I10 Essential (primary) hypertension: Secondary | ICD-10-CM | POA: Diagnosis not present

## 2021-01-19 DIAGNOSIS — M05741 Rheumatoid arthritis with rheumatoid factor of right hand without organ or systems involvement: Secondary | ICD-10-CM | POA: Diagnosis not present

## 2021-01-19 DIAGNOSIS — R7301 Impaired fasting glucose: Secondary | ICD-10-CM | POA: Diagnosis not present

## 2021-01-19 DIAGNOSIS — Z23 Encounter for immunization: Secondary | ICD-10-CM | POA: Diagnosis not present

## 2021-01-19 DIAGNOSIS — Z6835 Body mass index (BMI) 35.0-35.9, adult: Secondary | ICD-10-CM | POA: Diagnosis not present

## 2021-01-19 DIAGNOSIS — Z0001 Encounter for general adult medical examination with abnormal findings: Secondary | ICD-10-CM | POA: Diagnosis not present

## 2021-01-19 DIAGNOSIS — I25119 Atherosclerotic heart disease of native coronary artery with unspecified angina pectoris: Secondary | ICD-10-CM | POA: Diagnosis not present

## 2021-01-23 DIAGNOSIS — G4733 Obstructive sleep apnea (adult) (pediatric): Secondary | ICD-10-CM | POA: Diagnosis not present

## 2021-01-28 ENCOUNTER — Other Ambulatory Visit: Payer: Self-pay

## 2021-01-28 ENCOUNTER — Encounter: Payer: Self-pay | Admitting: Cardiovascular Disease

## 2021-01-28 ENCOUNTER — Encounter: Payer: Self-pay | Admitting: Primary Care

## 2021-01-28 ENCOUNTER — Ambulatory Visit: Payer: PPO | Admitting: Primary Care

## 2021-01-28 DIAGNOSIS — R001 Bradycardia, unspecified: Secondary | ICD-10-CM

## 2021-01-28 DIAGNOSIS — J849 Interstitial pulmonary disease, unspecified: Secondary | ICD-10-CM | POA: Diagnosis not present

## 2021-01-28 DIAGNOSIS — G4733 Obstructive sleep apnea (adult) (pediatric): Secondary | ICD-10-CM | POA: Diagnosis not present

## 2021-01-28 NOTE — Progress Notes (Signed)
@Patient  ID: Richard Simpson, male    DOB: [birth year], 51 y.o.   MRN: [id number]  Chief Complaint  Patient presents with   Follow-up    ILD    Referring provider: [facility]  HPI: 51 year old male, former smoker. PMH significant for ILD, OSA, RA, HTN, diastolic heart failure, CAD, NSTEMI, hyperlipidemia, snoring, obesity.   Previous [facility] pulmonary encounter:  51 year old male, former smoker quit in 2005 (3.75 pack year hx). PMH significant for  OSA on CPAP, ILD d/t rheumatoid arthritis, NSTEMI in 2005 s/p PTCA to LAD, diastolic heart failure, HTN, GERD, obesity. Patient of Dr. Elsworth Soho. Presumed Covid-19 in January 2021. Maintained on cellcept 1,000mg  twice daily and methylprednisolone 4mg  daily for RA.   Previous [facility] pulmonary encounter: 04/02/2019 Patient present today for follow-up visit for ILD and OSA. Wife present by video. He is still having some trouble breathing. Associated intermittent chest tightness and dry cough. He was presumed to have covid-19 back in January despite negative test. Family all tested positive. Due HRCT May/June. Went to ED last night around 11pm for chest pain. He took 3 nitroglycerin with temporary improvement. He saw cardiology today, planning for cardiac cath this Friday. He remains on lasix 40mg  daily which he has been taking regularly since January. Reports compliance with CPAP every night. Continues cellcept and methylprednisolone for RA. Seeing rheumatology tomorrow. Denies wheezing or purulent mucus.   06/21/2019 Patient presents today for 2-3 month follow-up with PFTs. States that his breathing has been alot better over the last month. He has been exercising and working on losing weight.  He is down 10 pounds since last office visit. Reports that he has been a little more comfortable going out and going to working but still has to be cautious not to over due it. PFTs today showed stable lung function and diffusion capacity.  Denies acute respiratory  symptoms.   ILD (interstitial lung disease) ([place]) - Patient had presumed COVID in January 2021. He has a hx RA-ILD. Breathing has improved over the last month with 10lb weight loss - Testing: PFTs 06/21/19- FEV1 3.91 (88%), ratio 88, DLCO 32.62 ( 99%) - stable lung function and diffusion capacity compared to 2018 / HRCT 06/18/19 minimal ground glass opacity significantly improved from 2018. Previous LUL opacity has resolved. Minimal nonspecific post infectious scarring without evidence of new fibrosis. - Plan: Continue Cellcept 1,000mg  twice daily and methylprednisolone 4mg  for RA per rheumatology - Follow-up: 6 months OV / Spirometry with DLCO in 1 year    01/28/2021- interim hx  Patient presents today for overdue annual follow-up OSA/ILD, renew CPAP supplies. Patient has received new order for CPAP supplies through his PCP. He is sleeping well at night. No snoring or restless sleep. He feels CPAP pressure is too high. DME company is [employer].   Breathing wise he is doing alright. He does get our of breath occasionally. He is able to do all ADLs and keep up with people of his same age. He was sick a few weeks back, symptoms resolved on their own. He has been checking oxygen at home which has been sustaining >90% RA. He walks on the treadmill 3-4 times a week. Denies acute shortness of breath, chest tightness or wheezing    Airview download 12/28/20-01/27/20 Usage 30/30 days (100%); 30 days (100%) > 4 hours Average usage days used 7 hours 53 mins Pressure 9cm h20 Leaks 6.7L/min AHI 2.3    Pulmonary testing:  > PFTs 10/2017 FVC  83%, DLCO 98% > PFTs 12/2013 >>no obs, FVC 77%, nml DLCO -  Mild restriction ?effect of obesity  > PFT 12/2016  moderate restriction . FEV1 70%, ratio 86, FVC 63%, DLCO 74%. > PFTs 06/21/2019- Post bronchodilator FVC 4.44 (78%), FEV1 3.91 (88%), ratio 88, DLCO 32.62 ( 99%)  Imaging: 06/18/19 HRCT - 1. There is minimal dependent bibasilar irregular and  ground-glass opacity, significantly improved in comparison to CT dated 01/19/2017, and additionally with complete resolution of previously seen left upper lobe airspace opacity seen at that time. Findings are consistent with minimal nonspecific post infectious or inflammatory scarring without specific evidence of fibrotic interstitial lung disease and no new evidence of fibrosis. 2. No acute airspace disease. 3. No significant air trapping on expiratory phase imaging. 4. Coronary artery disease.  Allergies  Allergen Reactions   Lisinopril Cough   Prednisone Other (See Comments)    Makes angry    Immunization History  Administered Date(s) Administered   Influenza Split 10/25/2016   Influenza,inj,Quad PF,6+ Mos 11/20/2014, 11/04/2017   Influenza,inj,Quad PF,6-35 Mos 10/26/2018, 01/14/2021   Influenza-Unspecified 12/09/2013, 12/03/2014, 12/15/2015    Past Medical History:  Diagnosis Date   Anxiety    CAD S/P percutaneous coronary angioplasty 10/2013   a. 10/2013 Cath: mod LAD dzs->nl FFR-> medically managed;  b. 06/2014 NSTEMI/PCi: LAD 99ost, 70p, 22m (entire area covered with a 3.5x20 Synergy DES), LCX nl, RCA 46m, 10d, EF 45-50%. c. 10/2015: cath showing patent LAD stent with 15% Prox LAD stenosis. EF 55-65%.    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 06/2014 EF 45-50% by LV gram b. Echo July 2016: EF 60-65%. No RWMA. Gr 1 DD    NSTEMI (non-ST elevated myocardial infarction) ([place]) 06/2014   Obesity (BMI 30-39.9) 10/29/2013   Primary snoring 10/29/2013   Rheumatoid arthritis ([place]) 09/23/2013   RLS (restless legs syndrome) 10/29/2013   Sleep apnea     Tobacco  History: Social History   Tobacco Use  Smoking Status Former   Packs/day: 0.25   Years: 15.00   Pack years: 3.75   Types: Cigarettes   Quit date: 09/04/2003   Years since quitting: 17.4  Smokeless Tobacco Current   Types: Chew   Ready to quit: Not Answered Counseling given: Not Answered   Outpatient Medications Prior to Visit  Medication Sig Dispense Refill   amLODipine (NORVASC) 5 MG tablet Take 1 tablet (5 mg total) by mouth daily. 45 tablet 2   aspirin EC 81 MG tablet Take 1 tablet (81 mg total) by mouth daily. 30 tablet 3   atorvastatin (LIPITOR) 80 MG tablet TAKE (1) TABLET BY MOUTH ONCE DAILY. 90 tablet 3   clonazePAM (KLONOPIN) 1 MG tablet Take 1 mg by mouth 3 (three) times daily as needed for anxiety. Take 1mg  tabletby mouth scheduled at bedtime, may take an additional dose if needed for anxiety     clopidogrel (PLAVIX) 75 MG tablet TAKE 1 TABLET BY MOUTH ONCE A DAY. 90 tablet 2   furosemide (LASIX) 20 MG tablet Take 1 tablet (20 mg total) by mouth daily. 90 tablet 3   loratadine (CLARITIN) 10 MG tablet  Take 10 mg by mouth daily.      losartan (COZAAR) 25 MG tablet Take 0.5 tablets (12.5 mg total) by mouth daily. Take in the evening 135 tablet 1   methylPREDNISolone (MEDROL) 4 MG tablet Take 4 mg by mouth daily.      mycophenolate (CELLCEPT) 500 MG tablet Take 1,000 mg by mouth 2 (two) times daily.      nitroGLYCERIN (NITROSTAT) 0.4 MG SL tablet DISSOLVE 1 TABLET UNDER TONGUE FOR CHEST TIGHTNESS AND SHORTNESS OF BREATH. MAY TAKE EVERY 5 MINUTES UP TO 3 DOSES. IF NO RELIEF AFTER 3 DOS 25 tablet 0   pantoprazole (PROTONIX) 40 MG tablet Take 1 tablet (40 mg total) by mouth daily. 90 tablet 3   ranolazine (RANEXA) 500 MG 12 hr tablet Take 1 tablet (500 mg total) by mouth 2 (two) times daily. 60 tablet 6   traMADol (ULTRAM) 50 MG tablet Take 50 mg by mouth every 6 (six) hours as needed for moderate pain.      metoprolol tartrate (LOPRESSOR) 25 MG tablet TAKE 1 AND 1/2 TABLET BY  MOUTH TWICE DAILY. 90 tablet 0   albuterol (PROAIR HFA) 108 (90 Base) MCG/ACT inhaler Inhale 2 puffs into the lungs every 6 (six) hours as needed for wheezing or shortness of breath. (Patient not taking: Reported on 01/28/2021) 8 g 0   ezetimibe (ZETIA) 10 MG tablet Take 1 tablet (10 mg total) by mouth daily. 90 tablet 3   metoprolol tartrate (LOPRESSOR) 25 MG tablet TAKE 1 AND 1/2 TABLET BY MOUTH TWICE DAILY. (Patient not taking: Reported on 01/28/2021) 90 tablet 0   pantoprazole (PROTONIX) 40 MG tablet TAKE 1 TABLET BY MOUTH ONCE A DAY. (Patient not taking: Reported on 01/28/2021) 30 tablet 5   amoxicillin-clavulanate (AUGMENTIN) 875-125 MG tablet Take 1 tablet by mouth 2 (two) times daily. (Patient not taking: Reported on 01/28/2021)     metoprolol tartrate (LOPRESSOR) 25 MG tablet Take 25 mg by mouth 2 (two) times daily. Take 1 Tablet Twice Daily (Patient not taking: Reported on 12/12/2020)     No facility-administered medications prior to visit.    Review of Systems  Review of Systems  Constitutional:  Negative for fatigue.  HENT: Negative.    Respiratory:  Negative for cough, shortness of breath and wheezing.   Cardiovascular: Negative.   Psychiatric/Behavioral:  Negative for sleep disturbance.     Physical Exam  BP 108/70 (BP Location: Left Arm, Cuff Size: Large)    Pulse (!) 53    Temp 97.9 F (36.6 C) (Oral)    Ht 6\' 1"  (1.854 m)    Wt 263 lb 12.8 oz (119.7 kg)    SpO2 99%    BMI 34.80 kg/m  Physical Exam Constitutional:      General: He is not in acute distress.    Appearance: Normal appearance. He is not ill-appearing.  HENT:     Head: Normocephalic and atraumatic.  Cardiovascular:     Rate and Rhythm: Normal rate and regular rhythm.  Pulmonary:     Effort: Pulmonary effort is normal.     Breath sounds: Normal breath sounds.  Musculoskeletal:        General: Normal range of motion.  Skin:    General: Skin is warm and dry.  Neurological:     General: No focal deficit  present.     Mental Status: He is alert and oriented to person, place, and time. Mental status is at baseline.     Lab Results:  CBC    Component Value Date/Time   WBC 7.6 12/12/2020 1058   WBC 7.1 04/02/2019 0007   RBC 5.20 12/12/2020 1058   RBC 5.03 04/02/2019 0007   HGB 16.7 12/12/2020 1058   HCT 48.3 12/12/2020 1058   PLT 193 12/12/2020 1058   MCV 93 12/12/2020 1058   MCH 32.1 12/12/2020 1058   MCH 33.0 04/02/2019 0007   MCHC 34.6 12/12/2020 1058   MCHC 33.6 04/02/2019 0007   RDW 11.7 12/12/2020 1058   LYMPHSABS 1.0 10/23/2017 0752   LYMPHSABS 1.2 08/06/2014 0746   MONOABS 0.5 10/23/2017 0752   EOSABS 0.0 10/23/2017 0752   EOSABS 0.1 08/06/2014 0746   BASOSABS 0.0 10/23/2017 0752   BASOSABS 0.0 08/06/2014 0746    BMET    Component Value Date/Time   NA 140 12/12/2020 1058   K 4.2 12/12/2020 1058   CL 101 12/12/2020 1058   CO2 24 12/12/2020 1058   GLUCOSE 88 12/12/2020 1058   GLUCOSE 110 (H) 04/02/2019 0007   BUN 14 12/12/2020 1058   CREATININE 1.04 12/12/2020 1058   CREATININE 1.04 11/11/2015 1607   CALCIUM 9.0 12/12/2020 1058   GFRNONAA >60 04/02/2019 0007   GFRNONAA 87 11/11/2015 1607   GFRAA >60 04/02/2019 0007   GFRAA >89 11/11/2015 1607    BNP    Component Value Date/Time   BNP 23.0 04/02/2019 0007    ProBNP    Component Value Date/Time   PROBNP 23 12/12/2020 1058   PROBNP 18.0 03/19/2015 1034    Imaging: No results found.   Assessment & Plan:   OSA (obstructive sleep apnea) - NPSG 12/2016>> Severe OSA, AHI 60/hr. Patient is 100% compliant with CPAP and reports benefit from use. He feels pressure is too strong. Current CPAP pressure 9cm h20 with residual AHI 2.3. Plan decrease pressure 8cm h20. Advised patient continue to wear CPAP every night for min 4-6 hours or longer.   ILD (interstitial lung disease) ([place]) - Clinically stable; Needs repeat pulmonary function testing at next OV  - Continue Cellcept and Methylprednisolone for  rheumatoid arthritis  - FU in 3-6 months with Dr. Elsworth Soho   Bradycardia - Patients HR 48-54, appears regular on exam. Denies cp and dizziness/lightheadedness.  - Advised he decrease metoprolol 25mg  this evening and contact cardiology    Martyn Ehrich, NP 01/28/2021

## 2021-01-28 NOTE — Assessment & Plan Note (Signed)
-   NPSG 12/2016>> Severe OSA, AHI 60/hr. Patient is 100% compliant with CPAP and reports benefit from use. He feels pressure is too strong. Current CPAP pressure 9cm h20 with residual AHI 2.3. Plan decrease pressure 8cm h20. Advised patient continue to wear CPAP every night for min 4-6 hours or longer.

## 2021-01-28 NOTE — Assessment & Plan Note (Addendum)
-   Patients HR 48-54, appears regular on exam. Denies cp and dizziness/lightheadedness.  - Advised he decrease metoprolol 25mg  this evening and contact cardiology

## 2021-01-28 NOTE — Patient Instructions (Addendum)
Recommendations: - Lowering CPAP pressure to 8cm h20, if pressure still seems too high please send Mychart message or call and we can adjust further  - Continue to wear CPAP every night for min 4-6 hours or longer - Continue Cellcept and Methylprednisolone for rheumatoid arthritis  - Need to return in 3-6 months to see Dr. Elsworth Soho with breathing test prior - Call if you develop worsening shortness of breath, wheezing or cough   Follow-up: - 3-6 months with Dr. Elsworth Soho 1 hour PFT prior    CPAP and BIPAP Information CPAP and BIPAP are methods that use air pressure to keep your airways open and to help you breathe well. CPAP and BIPAP use different amounts of pressure. Your health care provider will tell you whether CPAP or BIPAP would be more helpful for you. CPAP stands for "continuous positive airway pressure." With CPAP, the amount of pressure stays the same while you breathe in (inhale) and out (exhale). BIPAP stands for "bi-level positive airway pressure." With BIPAP, the amount of pressure will be higher when you inhale and lower when you exhale. This allows you to take larger breaths. CPAP or BIPAP may be used in the hospital, or your health care provider may want you to use it at home. You may need to have a sleep study before your health care provider can order a machine for you to use at home. What are the advantages? CPAP or BIPAP can be helpful if you have: Sleep apnea. Chronic obstructive pulmonary disease (COPD). Heart failure. Medical conditions that cause muscle weakness, including muscular dystrophy or amyotrophic lateral sclerosis (ALS). Other problems that cause breathing to be shallow, weak, abnormal, or difficult. CPAP and BIPAP are most commonly used for obstructive sleep apnea (OSA) to keep the airways from collapsing when the muscles relax during sleep. What are the risks? Generally, this is a safe treatment. However, problems may occur, including: Irritated skin or skin  sores if the mask does not fit properly. Dry or stuffy nose or nosebleeds. Dry mouth. Feeling gassy or bloated. Sinus or lung infection if the equipment is not cleaned properly. When should CPAP or BIPAP be used? In most cases, the mask only needs to be worn during sleep. Generally, the mask needs to be worn throughout the night and during any daytime naps. People with certain medical conditions may also need to wear the mask at other times, such as when they are awake. Follow instructions from your health care provider about when to use the machine. What happens during CPAP or BIPAP? Both CPAP and BIPAP are provided by a small machine with a flexible plastic tube that attaches to a plastic mask that you wear. Air is blown through the mask into your nose or mouth. The amount of pressure that is used to blow the air can be adjusted on the machine. Your health care provider will set the pressure setting and help you find the best mask for you. Tips for using the mask Because the mask needs to be snug, some people feel trapped or closed-in (claustrophobic) when first using the mask. If you feel this way, you may need to get used to the mask. One way to do this is to hold the mask loosely over your nose or mouth and then gradually apply the mask more snugly. You can also gradually increase the amount of time that you use the mask. Masks are available in various types and sizes. If your mask does not fit well, talk with  your health care provider about getting a different one. Some common types of masks include: Full face masks, which fit over the mouth and nose. Nasal masks, which fit over the nose. Nasal pillow or prong masks, which fit into the nostrils. If you are using a mask that fits over your nose and you tend to breathe through your mouth, a chin strap may be applied to help keep your mouth closed. Use a skin barrier to protect your skin as told by your health care provider. Some CPAP and BIPAP  machines have alarms that may sound if the mask comes off or develops a leak. If you have trouble with the mask, it is very important that you talk with your health care provider about finding a way to make the mask easier to tolerate. Do not stop using the mask. There could be a negative impact on your health if you stop using the mask. Tips for using the machine Place your CPAP or BIPAP machine on a secure table or stand near an electrical outlet. Know where the on/off switch is on the machine. Follow instructions from your health care provider about how to set the pressure on your machine and when you should use it. Do not eat or drink while the CPAP or BIPAP machine is on. Food or fluids could get pushed into your lungs by the pressure of the CPAP or BIPAP. For home use, CPAP and BIPAP machines can be rented or purchased through home health care companies. Many different brands of machines are available. Renting a machine before purchasing may help you find out which particular machine works well for you. Your health insurance company may also decide which machine you may get. Keep the CPAP or BIPAP machine and attachments clean. Ask your health care provider for specific instructions. Check the humidifier if you have a dry stuffy nose or nosebleeds. Make sure it is working correctly. Follow these instructions at home: Take over-the-counter and prescription medicines only as told by your health care provider. Ask if you can take sinus medicine if your sinuses are blocked. Do not use any products that contain nicotine or tobacco. These products include cigarettes, chewing tobacco, and vaping devices, such as e-cigarettes. If you need help quitting, ask your health care provider. Keep all follow-up visits. This is important. Contact a health care provider if: You have redness or pressure sores on your head, face, mouth, or nose from the mask or head gear. You have trouble using the CPAP or BIPAP  machine. You cannot tolerate wearing the CPAP or BIPAP mask. Someone tells you that you snore even when wearing your CPAP or BIPAP. Get help right away if: You have trouble breathing. You feel confused. Summary CPAP and BIPAP are methods that use air pressure to keep your airways open and to help you breathe well. If you have trouble with the mask, it is very important that you talk with your health care provider about finding a way to make the mask easier to tolerate. Do not stop using the mask. There could be a negative impact to your health if you stop using the mask. Follow instructions from your health care provider about when to use the machine. This information is not intended to replace advice given to you by your health care provider. Make sure you discuss any questions you have with your health care provider. Document Revised: 08/20/2020 Document Reviewed: 12/21/2019 Elsevier Patient Education  2022 [employer].

## 2021-01-28 NOTE — Telephone Encounter (Signed)
Spoke to patient he stated when he saw his lung Dr this morning she was concerned about his low pulse 48,53.Stated he was feeling ok not aware.Stated pulse at present 73.Advised to continue Metoprolol 25 mg 1&1/2 tablets twice a day.Advised to monitor pulse and B/P daily.If pulse stays in the 40's call back.Advised to keep appointment already scheduled with Caron Presume 1/25 at 10:30 am.Bring pulse and B/P readings to appointment.

## 2021-01-28 NOTE — Assessment & Plan Note (Signed)
-   Clinically stable; Needs repeat pulmonary function testing at next OV  - Continue Cellcept and Methylprednisolone for rheumatoid arthritis  - FU in 3-6 months with Dr. Elsworth Soho

## 2021-02-11 ENCOUNTER — Other Ambulatory Visit: Payer: Self-pay | Admitting: Cardiovascular Disease

## 2021-02-17 ENCOUNTER — Encounter: Payer: Self-pay | Admitting: Gastroenterology

## 2021-02-17 ENCOUNTER — Other Ambulatory Visit: Payer: Self-pay

## 2021-02-17 ENCOUNTER — Ambulatory Visit (INDEPENDENT_AMBULATORY_CARE_PROVIDER_SITE_OTHER): Payer: PPO | Admitting: Gastroenterology

## 2021-02-17 VITALS — BP 112/78 | HR 60 | Temp 97.7°F | Ht 73.0 in | Wt 261.6 lb

## 2021-02-17 DIAGNOSIS — R109 Unspecified abdominal pain: Secondary | ICD-10-CM | POA: Diagnosis not present

## 2021-02-17 NOTE — Progress Notes (Signed)
Primary Care Physician: [facility]  Primary Gastroenterologist:  Garfield Cornea, MD   Chief Complaint  Patient presents with   Abdominal Pain    Right upper abd and right side. Was intense few months ago. Not as bad now    HPI: Richard Simpson is a 51 y.o. male here for further evaluation of right sided abdominal pain.   Patient had similar symptoms in 2020. Symptoms returned several months ago. Pain starts in the RUQ under the rib and radiates downward towards right lower abdomen. Sharp/intense pain, up to 6 on pain scale. Typically symptoms brought on by meals. Extensively evaluation as outlined below. He notes that he had been off PPI for about two months before the last episodes began. He started having reflux and restarted his pantoprazole which helped typical reflux symptoms. One month later noted recurrent RUQ pain. He has had similar symptoms off/on over the years but not that pain. Some days will have no pain except for if he pushes on the area. BM have been regular. Takes Benefiber daily. No brbpr, melena. Notes he got food poisoning right before Christmas, heartburn got real bad, used a lot of TUMS. Notes his RUQ pain has not been as bad. Notes Levsin did not help his abdominal pain. Tried it for about a week.   Intermittently his ALT has been elevated. ALT 51 in 2016, normal in 2020/2021. ALT up in 11/2020, initially 53-->48-->37. Patient reports his numbers have been up in the past with cell cept.   Prior work up:  EGD 09/2018: Esophagitis -not typical of reflux. Query pill induced injury versus nonspecific.Bx of GEJ negative. - Normal stomach. - Normal duodenal bulb and second portion of the duodenum. 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  CT A/P with contrast 07/2018: fatty liver, bilateral nonobstructing rigt renal stones. Appendix normal.  MRCP 12/2020: mild hepatic steatosis, 75mm tiny right hepatic lobe cyst, CBD 48mm, no choledocholithiasis  or biliary dilation, pancreas with equivocal edema involving the substance of the pancreas. No main duct dilation. Spleen normal in size.      05/2016 One 4 mm polyp in the ascending colon, removed with a cold snare. Resected and retrieved. - Diverticulosis in the sigmoid colon and in the descending colon. - Non-bleeding internal hemorrhoids. - The examination was otherwise normal on direct and retroflexion views. I suspect he has bled from hemorrhoids in the setting of antiplatelet therapy. He is not a good candidate for hemorrhoidal banding secondary to obligatory antiplatelet therapy  Current Outpatient Medications  Medication Sig Dispense Refill   albuterol (PROAIR HFA) 108 (90 Base) MCG/ACT inhaler Inhale 2 puffs into the lungs every 6 (six) hours as needed for wheezing or shortness of breath. 8 g 0   amLODipine (NORVASC) 5 MG tablet Take 1 tablet (5 mg total) by mouth daily. 45 tablet 2   aspirin EC 81 MG tablet Take 1 tablet (81 mg total) by mouth daily. 30 tablet 3   atorvastatin (LIPITOR) 80 MG tablet TAKE (1) TABLET BY MOUTH ONCE DAILY. 90 tablet 3   clonazePAM (KLONOPIN) 1 MG tablet Take 1 mg by mouth 3 (three) times daily as needed for anxiety. Take 1mg  tabletby mouth scheduled at bedtime, may take an additional dose if needed for anxiety     clopidogrel (PLAVIX) 75 MG tablet TAKE 1 TABLET BY MOUTH ONCE A DAY. 90 tablet 2   ezetimibe (ZETIA) 10 MG tablet Take 1 tablet (10 mg total) by  mouth daily. 90 tablet 3   furosemide (LASIX) 20 MG tablet Take 1 tablet (20 mg total) by mouth daily. 90 tablet 3   loratadine (CLARITIN) 10 MG tablet Take 10 mg by mouth daily.      losartan (COZAAR) 25 MG tablet Take 0.5 tablets (12.5 mg total) by mouth daily. Take in the evening 135 tablet 1   metoprolol tartrate (LOPRESSOR) 25 MG tablet TAKE 1 AND 1/2 TABLET BY MOUTH TWICE DAILY. 90 tablet 1   mycophenolate (CELLCEPT) 500 MG tablet Take 1,000 mg by mouth 2 (two) times daily.      nitroGLYCERIN  (NITROSTAT) 0.4 MG SL tablet DISSOLVE 1 TABLET UNDER TONGUE FOR CHEST TIGHTNESS AND SHORTNESS OF BREATH. MAY TAKE EVERY 5 MINUTES UP TO 3 DOSES. IF NO RELIEF AFTER 3 DOS 25 tablet 0   pantoprazole (PROTONIX) 40 MG tablet TAKE 1 TABLET BY MOUTH ONCE A DAY. 30 tablet 5   pantoprazole (PROTONIX) 40 MG tablet Take 1 tablet (40 mg total) by mouth daily. 90 tablet 3   PRESCRIPTION MEDICATION Fluorouracil 5% + Calcipotriene 0.005% to head.     ranolazine (RANEXA) 500 MG 12 hr tablet Take 1 tablet (500 mg total) by mouth 2 (two) times daily. 60 tablet 6   traMADol (ULTRAM) 50 MG tablet Take 50 mg by mouth every 6 (six) hours as needed for moderate pain.      No current facility-administered medications for this visit.    Allergies as of 02/17/2021 - Review Complete 02/17/2021  Allergen Reaction Noted   Lisinopril Cough 07/12/2014   Prednisone Other (See Comments) 10/29/2013    ROS:  General: Negative for anorexia, weight loss, fever, chills, fatigue, weakness. ENT: Negative for hoarseness, difficulty swallowing , nasal congestion. CV: Negative for chest pain, angina, palpitations, dyspnea on exertion, peripheral edema.  Respiratory: Negative for dyspnea at rest, dyspnea on exertion, cough, sputum, wheezing.  GI: See history of present illness. GU:  Negative for dysuria, hematuria, urinary incontinence, urinary frequency, nocturnal urination.  Endo: Negative for unusual weight change.    Physical Examination:   BP 112/78    Pulse 60    Temp 97.7 F (36.5 C) (Temporal)    Ht 6\' 1"  (1.854 m)    Wt 261 lb 9.6 oz (118.7 kg)    BMI 34.51 kg/m   General: Well-nourished, well-developed in no acute distress.  Eyes: No icterus. Mouth: masked Lungs: Clear to auscultation bilaterally.  Heart: Regular rate and rhythm, no murmurs rubs or gallops.  Abdomen: Bowel sounds are normal, nontender, nondistended, no hepatosplenomegaly or masses, no abdominal bruits or hernia , no rebound or guarding.    Extremities: No lower extremity edema. No clubbing or deformities. Neuro: Alert and oriented x 4   Skin: Warm and dry, no jaundice.   Psych: Alert and cooperative, normal mood and affect.  Labs:  Lab Results  Component Value Date   ALT 37 12/22/2020   AST 21 12/22/2020   ALKPHOS 83 12/22/2020   BILITOT 1.1 12/22/2020     Lab Results  Component Value Date   WBC 7.6 12/12/2020   HGB 16.7 12/12/2020   HCT 48.3 12/12/2020   MCV 93 12/12/2020   PLT 193 12/12/2020   Lab Results  Component Value Date   CREATININE 1.04 12/12/2020   BUN 14 12/12/2020   NA 140 12/12/2020   K 4.2 12/12/2020   CL 101 12/12/2020   CO2 24 12/12/2020    Imaging Studies: No results found.   Assessment:  Right sided abdominal pain: intermittent in nature. Similar presentation in 2020. CT and EGD at that time showed esophagitis and duodenal lipoma. Symptoms improveds significantly with only mild symptoms since then. Recently in past couple of months, symptoms returned. Right sided pain, worse postprandially. Extending from ruq to right mid abdomen. Prior to onset of symptoms, he noted heartburn after coming off PPI for 2 months. Restarted his pantoprazole and heartburn resolved. Few weeks later however started have right sided abdominal pain again. MRCP/MRI abd completed with CBD 33mm but no apparent choledocholithiasis. Equivocal edema involving substance of the pancreas. Unclear if significant finding. Currently symptoms improved again. No significant pain in several weeks. Intermittent biliary obstruction from sphincter of oddi still on differently. Unlikely that duodenal lipoma causing any symptoms. May require further imaging to further evaluate symptoms, evaluate pancreas.   Elevated ALT: intermittent, possibly medication related or fatty liver. Not clearly related to his pain.   Plan: Complete labs 6 hours after next episode of right sided abdominal pain. To include CMET, lipase, CBC.  To discuss  further work up with Dr. Gala Romney. Query if EUS would be beneficial.

## 2021-02-17 NOTE — Progress Notes (Signed)
Cardiology Office Note:    Date:  02/18/2021   ID:  Richard Simpson, DOB [birth year], MRN [id number]  PCP:  [facility] [place] Cardiologist: [name], MD   Reason for visit: 2-month follow-up  History of Present Illness:    Richard Simpson is a 51 y.o. male, retired Engineer, structural, with a hx of CAD s/p DES to LAD in June 2016, anxiety, depression, HLD, HTN, GERD, interstitial lung disease, ICM, RA, restless leg syndrome, and obstructive sleep apnea.  LHC February 2017 for recurrent chest pain and found to have widely patent LAD stent.  Etiology for angina was felt to be microvascular disease versus diastolic dysfunction.  He had another cardiac catheterization in October 2018 for chest pain which again showed patent LAD with no significant residual disease.  The chest pain was nitro responsive.  Last echocardiogram obtained on 03/07/2019 showed EF 65 to 70%, grade 1 DD, normal PASP, trivial AI.  He underwent a repeat cardiac catheterization on 04/06/2019 that showed widely patent proximal LAD stent, 35% lesion in mid LAD, no other significant coronary artery disease.  Right heart pressure was normal.  LVEDP was normal.  His chest discomfort and shortness of breath was felt to be related to pulmonary disease.  Medical therapy was recommended.  He is being closely followed by pulmonology service for interstitial lung disease.  I last saw him in October 2022.  Patient's goal was to lose 20 to 30 pounds in the next 3 months.  He also was hoping to fish and hunt more.  He saw Dr. Irish Lack in November 2022 with chest pain and dyspnea  onexertion.  He was started on Ranexa 500 mg twice a day.  Today, he feels well.  Sounds like his angina has finally resolved on his current medical therapy, particularly after the addition of Ranexa.  His hope was to lose weight from last visit.  However, with recent GI issues and skin lesion treatments, he has not been able to be as  aggressive with his exercise.  He does have a membership to [employer].  He is very familiar with a good eating and exercise habits.  His family owned a gym for many years.  He denies chest pain, shortness of breath, syncope, orthopnea, PND or significant pedal edema.  No palpitations.  He states his interstitial lung disease has been stable and he has follow-up pulmonary testing scheduled.  He is compliant with his CPAP.    Past Medical History:  Diagnosis Date   Anxiety    CAD S/P percutaneous coronary angioplasty 10/2013   a. 10/2013 Cath: mod LAD dzs->nl FFR-> medically managed;  b. 06/2014 NSTEMI/PCi: LAD 99ost, 70p, 47m (entire area covered with a 3.5x20 Synergy DES), LCX nl, RCA 42m, 10d, EF 45-50%. c. 10/2015: cath showing patent LAD stent with 15% Prox LAD stenosis. EF 55-65%.    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 06/2014 EF 45-50% by LV gram b. Echo July 2016: EF 60-65%. No RWMA. Gr 1 DD    NSTEMI (non-ST elevated myocardial infarction) ([place]) 06/2014   Obesity (BMI 30-39.9) 10/29/2013  Primary snoring 10/29/2013   Rheumatoid arthritis ([place]) 09/23/2013   RLS (restless legs syndrome) 10/29/2013   Sleep apnea     Past Surgical History:  Procedure Laterality Date   BIOPSY  09/27/2018   Procedure: BIOPSY;  Surgeon: Daneil Dolin, MD;  [facility];  Service: Endoscopy;;   CARDIAC CATHETERIZATION N/A 07/05/2014   Procedure: Left Heart Cath and Coronary Angiography;  Surgeon: Troy Sine, MD;  Location: [place] [facility];  Service: Cardiovascular;  NSTEMI --> Ostial LAd 99%, prox 80% & early mid 50% --> PCI   CARDIAC CATHETERIZATION  07/05/2014   Procedure:  Coronary Stent Intervention;  Surgeon: Troy Sine, MD;  Location: [place] [facility];  Service: Cardiovascular; Ostial-prox LAD tandem 99, 80 & 50% --> Synergy DES 3.5 x 20 (3.74)   CARDIAC CATHETERIZATION N/A 03/24/2015   Procedure: Left Heart Cath and Coronary Angiography;  Surgeon: Leonie Man, MD;  Location: [place] [facility];  Service: Cardiovascular;  Laterality: N/A;   CARDIAC CATHETERIZATION N/A 11/20/2015   Procedure: Left Heart Cath and Coronary Angiography;  Surgeon: Troy Sine, MD;  Location: [place] [facility];  Service: Cardiovascular;  Laterality: N/A;   CHOLECYSTECTOMY     10 yrs ago-jen[facility]   COLONOSCOPY N/A 03/23/2013   Procedure: COLONOSCOPY;  Surgeon: Daneil Dolin, MD;  [facility];  Service: Endoscopy;  Laterality: N/A;  2:15-moved to 1030 Staff notified pt   COLONOSCOPY WITH PROPOFOL N/A 06/10/2016   Procedure: COLONOSCOPY WITH PROPOFOL;  Surgeon: Daneil Dolin, MD;  [facility];  Service: Endoscopy;  Laterality: N/A;  730    ESOPHAGOGASTRODUODENOSCOPY (EGD) WITH PROPOFOL N/A 09/27/2018   Procedure: ESOPHAGOGASTRODUODENOSCOPY (EGD) WITH PROPOFOL;  Surgeon: Daneil Dolin, MD;  [facility];  Service: Endoscopy;  Laterality: N/A;  1:30pm   FRACTIONAL FLOW RESERVE WIRE  11/23/2013   Procedure: FRACTIONAL FLOW RESERVE WIRE;  Surgeon: Jettie Booze, MD;  Location: [facility];  Service: Cardiovascular;;   GALLBLADDER SURGERY     LEFT HEART CATHETERIZATION WITH CORONARY ANGIOGRAM N/A 11/23/2013   Procedure: LEFT HEART CATHETERIZATION WITH CORONARY ANGIOGRAM;  Surgeon: Jettie Booze, MD;  Location: [facility] C[facility];  Service: Cardiovascular;  Laterality: N/A;   MASS EXCISION  09/09/2010   Procedure: EXCISION MASS;  Surgeon: Arther Abbott, MD;  Location: [facility];  Service: Orthopedics;  Laterality: Left;  Excision Mass Left Long Finger   POLYPECTOMY  06/10/2016   Procedure: POLYPECTOMY;  Surgeon: Daneil Dolin, MD;   [facility];  Service: Endoscopy;;  colon   RIGHT/LEFT HEART CATH AND CORONARY ANGIOGRAPHY N/A 04/06/2019   Procedure: RIGHT/LEFT HEART CATH AND CORONARY ANGIOGRAPHY;  Surgeon: Martinique, Peter M, MD;  Location: [place] [facility];  Service: Cardiovascular;  Laterality: N/A;   THROAT SURGERY     nodules removed from throat as child   TRANSTHORACIC ECHOCARDIOGRAM   July 2016:    EF 60-65%. No RWMA. Gr 1 DD    Current Medications: Current Meds  Medication Sig   albuterol (PROAIR HFA) 108 (90 Base) MCG/ACT inhaler Inhale 2 puffs into the lungs every 6 (six) hours as needed for wheezing or shortness of breath.   amLODipine (NORVASC) 5 MG tablet Take 1 tablet (5 mg total) by mouth daily.   aspirin EC 81 MG tablet Take 1 tablet (81 mg total) by mouth daily.   atorvastatin (LIPITOR) 80 MG tablet TAKE (1) TABLET BY MOUTH ONCE DAILY.  clonazePAM (KLONOPIN) 1 MG tablet Take 1 mg by mouth 3 (three) times daily as needed for anxiety. Take 1mg  tabletby mouth scheduled at bedtime, may take an additional dose if needed for anxiety   clopidogrel (PLAVIX) 75 MG tablet TAKE 1 TABLET BY MOUTH ONCE A DAY.   ezetimibe (ZETIA) 10 MG tablet Take 1 tablet (10 mg total) by mouth daily.   furosemide (LASIX) 20 MG tablet Take 1 tablet (20 mg total) by mouth daily.   loratadine (CLARITIN) 10 MG tablet Take 10 mg by mouth daily.    losartan (COZAAR) 25 MG tablet Take 0.5 tablets (12.5 mg total) by mouth daily. Take in the evening   metoprolol tartrate (LOPRESSOR) 25 MG tablet TAKE 1 AND 1/2 TABLET BY MOUTH TWICE DAILY.   mycophenolate (CELLCEPT) 500 MG tablet Take 1,000 mg by mouth 2 (two) times daily.    nitroGLYCERIN (NITROSTAT) 0.4 MG SL tablet DISSOLVE 1 TABLET UNDER TONGUE FOR CHEST TIGHTNESS AND SHORTNESS OF BREATH. MAY TAKE EVERY 5 MINUTES UP TO 3 DOSES. IF NO RELIEF AFTER 3 DOS   pantoprazole (PROTONIX) 40 MG tablet TAKE 1 TABLET BY MOUTH ONCE A DAY.   PRESCRIPTION MEDICATION Fluorouracil 5% +  Calcipotriene 0.005% to head.   ranolazine (RANEXA) 500 MG 12 hr tablet Take 1 tablet (500 mg total) by mouth 2 (two) times daily.   traMADol (ULTRAM) 50 MG tablet Take 50 mg by mouth every 6 (six) hours as needed for moderate pain.      Allergies:   Lisinopril and Prednisone   Social History   Socioeconomic History   Marital status: Married    Spouse name: Renee   Number of children: 2   Years of education: 12th grade   Highest education level: Not on file  Occupational History   Occupation: Retired Chemical engineer: [employer]  Tobacco Use   Smoking status: Former    Packs/day: 0.25    Years: 15.00    Pack years: 3.75    Types: Cigarettes    Quit date: 09/04/2003    Years since quitting: 17.4   Smokeless tobacco: Current    Types: Chew  Vaping Use   Vaping Use: Never used  Substance and Sexual Activity   Alcohol use: Yes    Alcohol/week: 1.0 - 2.0 standard drink    Types: 1 - 2 Cans of beer per week    Comment: rare   Drug use: No   Sexual activity: Yes  Other Topics Concern   Not on file  Social History Narrative   Patient lives at home with his wife [name]).    Patient works full time Dentist..   Education high school.   Right handed.   Caffeine one cup of coffee daily and one soda.   Social Determinants of Health   Financial Resource Strain: Not on file  Food Insecurity: Not on file  Transportation Needs: Not on file  Physical Activity: Not on file  Stress: Not on file  Social Connections: Not on file     Family History: The patient's family history includes Arthritis in his paternal uncle; Cancer in his maternal grandfather; Diabetes in his maternal grandmother, mother, paternal grandfather, and other family members; Hypertension in his maternal grandmother and mother. There is no history of Anesthesia problems, Hypotension, Malignant hyperthermia, Pseudochol deficiency, Heart attack, or Stroke.  ROS:   Please see the  history of present illness.     EKGs/Labs/Other Studies Reviewed:  EKG:  The ekg ordered today demonstrates normal sinus rhythm, heart rate 56, PR interval 138 ms, QRS duration 78 ms.  Recent Labs: 12/12/2020: BUN 14; Creatinine, Ser 1.04; Hemoglobin 16.7; NT-Pro BNP 23; Platelets 193; Potassium 4.2; Sodium 140 12/22/2020: ALT 37   Recent Lipid Panel Lab Results  Component Value Date/Time   CHOL 102 11/17/2020 08:19 AM   TRIG 129 11/17/2020 08:19 AM   HDL 30 (L) 11/17/2020 08:19 AM   LDLCALC 49 11/17/2020 08:19 AM    Physical Exam:    VS:  BP 120/88    Pulse (!) 56    Ht 6\' 1"  (1.854 m)    Wt 261 lb 6.4 oz (118.6 kg)    SpO2 98%    BMI 34.49 kg/m    No data found.  Wt Readings from Last 3 Encounters:  02/18/21 261 lb 6.4 oz (118.6 kg)  02/17/21 261 lb 9.6 oz (118.7 kg)  01/28/21 263 lb 12.8 oz (119.7 kg)     GEN:  Well nourished, well developed in no acute distress, overweight HEENT: Normal NECK: No JVD; No carotid bruits CARDIAC: RRR, no murmurs, rubs, gallops RESPIRATORY:  Clear to auscultation without rales, wheezing or rhonchi  ABDOMEN: Soft, non-tender, non-distended MUSCULOSKELETAL: No edema; No deformity  SKIN: Warm and dry NEUROLOGIC:  Alert and oriented PSYCHIATRIC:  Normal affect     ASSESSMENT AND PLAN   CAD with no angina -Continue metoprolol, Norvasc and Ranexa. -History of headache, dizziness & hypotension with Imdur in 2017. -Continue aspirin, statin therapy.   Hypertension, well controlled -Medications as above. -Goal BP is <130/80.  Recommend DASH diet, salt restriction and increase physical activity.   Hyperlipidemia, now controlled -LDL 49 in 10/2020 after adding Zetia.  Continue Lipitor and Zetia.   Obesity -BMI 34.  His goal is to lose 20 to 30 pounds.  He plans to get back to the gym more regularly once his other medical issues are stable. -Given information on healthy lifestyle including nutrition, exercise, stress management and  good sleep habits.   Disposition - Follow-up in 6 months.  No med changes.  Continue weight loss efforts.  If stable at 50-month follow-up, consider changing to yearly appointments.        Medication Adjustments/Labs and Tests Ordered: Current medicines are reviewed at length with the patient today.  Concerns regarding medicines are outlined above.  Orders Placed This Encounter  Procedures   EKG 12-Lead   No orders of the defined types were placed in this encounter.   Patient Instructions  Medication Instructions:  No Changes *If you need a refill on your cardiac medications before your next appointment, please call your pharmacy*   Lab Work: No Labs If you have labs (blood work) drawn today and your tests are completely normal, you will receive your results only by: [place] (if you have MyChart) OR A paper copy in the mail If you have any lab test that is abnormal or we need to change your treatment, we will call you to review the results.   Testing/Procedures: No Testing   Follow-Up: At [facility], you and your health needs are our priority.  As part of our continuing mission to provide you with exceptional heart care, we have created designated Provider Care Teams.  These Care Teams include your primary Cardiologist (physician) and Advanced Practice Providers (APPs -  Physician Assistants and Nurse Practitioners) who all work together to provide you with the care you need, when you need it.  We recommend signing up for the patient portal called "MyChart".  Sign up information is provided on this After Visit Summary.  MyChart is used to connect with patients for Virtual Visits (Telemedicine).  Patients are able to view lab/test results, encounter notes, upcoming appointments, etc.  Non-urgent messages can be sent to your provider as well.   To learn more about what you can do with MyChart, go to [email or web address].    Your next appointment:   6  month(s)  The format for your next appointment:   In Person  Provider:   [name], MD     Other Instructions Recommend healthy Eating and Increased Activity.    Signed, Warren Lacy, PA-C  02/18/2021 11:15 AM    [facility]

## 2021-02-17 NOTE — Patient Instructions (Signed)
Please have labs done at [facility] about 6 hours after next episode of right sided abdominal pain. If lab is closed during that time frame, please go first thing the next morning.  I will discuss your case further with Dr. Gala Romney and be in touch.

## 2021-02-18 ENCOUNTER — Encounter: Payer: Self-pay | Admitting: Physician Assistant

## 2021-02-18 ENCOUNTER — Ambulatory Visit (INDEPENDENT_AMBULATORY_CARE_PROVIDER_SITE_OTHER): Payer: PPO | Admitting: Physician Assistant

## 2021-02-18 VITALS — BP 120/88 | HR 56 | Ht 73.0 in | Wt 261.4 lb

## 2021-02-18 DIAGNOSIS — R072 Precordial pain: Secondary | ICD-10-CM

## 2021-02-18 DIAGNOSIS — I10 Essential (primary) hypertension: Secondary | ICD-10-CM | POA: Diagnosis not present

## 2021-02-18 DIAGNOSIS — E785 Hyperlipidemia, unspecified: Secondary | ICD-10-CM | POA: Diagnosis not present

## 2021-02-18 DIAGNOSIS — E669 Obesity, unspecified: Secondary | ICD-10-CM | POA: Diagnosis not present

## 2021-02-18 DIAGNOSIS — I25119 Atherosclerotic heart disease of native coronary artery with unspecified angina pectoris: Secondary | ICD-10-CM | POA: Diagnosis not present

## 2021-02-18 NOTE — Patient Instructions (Signed)
Medication Instructions:  No Changes *If you need a refill on your cardiac medications before your next appointment, please call your pharmacy*   Lab Work: No Labs If you have labs (blood work) drawn today and your tests are completely normal, you will receive your results only by: [place] (if you have MyChart) OR A paper copy in the mail If you have any lab test that is abnormal or we need to change your treatment, we will call you to review the results.   Testing/Procedures: No Testing   Follow-Up: At [facility], you and your health needs are our priority.  As part of our continuing mission to provide you with exceptional heart care, we have created designated Provider Care Teams.  These Care Teams include your primary Cardiologist (physician) and Advanced Practice Providers (APPs -  Physician Assistants and Nurse Practitioners) who all work together to provide you with the care you need, when you need it.  We recommend signing up for the patient portal called "MyChart".  Sign up information is provided on this After Visit Summary.  MyChart is used to connect with patients for Virtual Visits (Telemedicine).  Patients are able to view lab/test results, encounter notes, upcoming appointments, etc.  Non-urgent messages can be sent to your provider as well.   To learn more about what you can do with MyChart, go to [email or web address].    Your next appointment:   6 month(s)  The format for your next appointment:   In Person  Provider:   [name], MD     Other Instructions Recommend healthy Eating and Increased Activity.

## 2021-02-24 ENCOUNTER — Other Ambulatory Visit: Payer: Self-pay

## 2021-02-24 ENCOUNTER — Telehealth: Payer: Self-pay

## 2021-02-24 ENCOUNTER — Telehealth: Payer: Self-pay | Admitting: Gastroenterology

## 2021-02-24 DIAGNOSIS — R109 Unspecified abdominal pain: Secondary | ICD-10-CM

## 2021-02-24 DIAGNOSIS — R948 Abnormal results of function studies of other organs and systems: Secondary | ICD-10-CM

## 2021-02-24 NOTE — Telephone Encounter (Signed)
-----   Message from Milus Banister, MD sent at 02/24/2021  9:07 AM EST ----- Regarding: RE: ?EUS Hey, I think EUS evaluation of the pancreas is a reasonable next step. Can you let him know and we will reach out to offer an appt.  Thanks  Beck Cofer, He needs upper EUS, first available with myself or Gabe for abnormal pancreas, intermittent abd pains.  Wynetta Fines  Thanks all ----- Message ----- From: Westly Pam Sent: 02/24/2021   8:27 AM EST To: Milus Banister, MD, Mahala Menghini, PA-C Subject: ?EUS                                           Good morning. This 51 y/o patient is a friend of Dr. Gala Romney who I have recently seen for recurrent abdominal pain. He had similar symptoms in 2020 and started back again over the past several months. RUQ pain radiates down to right mid abdomen after meals. He has had intermittent elevation of ALT but this could be unrelated. He is also on Cellcept for RA.  Worried that his pain is unexplained. Wonder if findings regarding pancreas on MRI would warrant further investigation with EUS or if D3 mass/lipoma is causing intermittent obstruction. Dr. Gala Romney requested reaching out to see if you thought EUS would be of benefit.      Prior work up:  EGD 09/2018: Esophagitis -not typical of reflux. Query pill induced injury versus nonspecific.Bx of GEJ negative. - Normal stomach. - Normal duodenal bulb and second portion of the duodenum. 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  CT A/P with contrast 07/2018: fatty liver, bilateral nonobstructing rigt renal stones. Appendix normal.  MRCP 12/2020: mild hepatic steatosis, 48mm tiny right hepatic lobe cyst, CBD 66mm, no choledocholithiasis or biliary dilation, pancreas with equivocal edema involving the substance of the pancreas. No main duct dilation. Spleen normal in size.       Magda Paganini

## 2021-02-24 NOTE — Telephone Encounter (Signed)
The pt has been scheduled for 04/09/21 at 930 am at [facility] with DJ.   Anti coag letter sent to Dr [name]  Left message on machine to call back

## 2021-02-24 NOTE — Telephone Encounter (Signed)
Discussed recommendation for EUS with patient. He is agreeable and will await further instructions from Dr. Eugenia Pancoast office.   FYI, Dr. Gala Romney.

## 2021-02-24 NOTE — Telephone Encounter (Signed)
The pt appt has been moved to 04/13/21 at 730 am at [facility] with GM.  He has been advised to stop plavix 5 days prior.  See response dated 1/31  The pt has been advised and new instructions sent

## 2021-02-24 NOTE — Telephone Encounter (Signed)
° °  Primary Cardiologist: [name], MD  Chart reviewed as part of pre-operative protocol coverage. Given past medical history and time since last visit, based on ACC/AHA guidelines, [name] would be at acceptable risk for the planned procedure without further cardiovascular testing.   His Plavix may be held for 5 days prior to his procedure.  Please resume as soon as hemostasis is achieved.  I will route this recommendation to the requesting party via Epic fax function and remove from pre-op pool.  Please call with questions.  Richard Simpson. Richard Kunzler NP-C    02/24/2021, 11:16 AM [address] Office [phone] Fax [phone]

## 2021-02-24 NOTE — Telephone Encounter (Signed)
See alternate phone note dated 1/31

## 2021-02-24 NOTE — Telephone Encounter (Signed)
Patient called and stated that he is going to be out of town on the day of his procedure 3/16 and needs to reschedule. Please advise.

## 2021-02-24 NOTE — Telephone Encounter (Signed)
[facility] Pre-operative Risk Assessment     Request for surgical clearance:     Endoscopy Procedure  What type of surgery is being performed?     EUS  When is this surgery scheduled?     04/09/21  What type of clearance is required ?   Pharmacy  Are there any medications that need to be held prior to surgery and how long? Plavix  Practice name and name of physician performing surgery?      [facility]  What is your office phone and fax number?      Phone- [phone]  [phone]  Anesthesia type (None, local, MAC, general) ?       MAC

## 2021-03-03 DIAGNOSIS — J329 Chronic sinusitis, unspecified: Secondary | ICD-10-CM | POA: Diagnosis not present

## 2021-03-03 DIAGNOSIS — Z6836 Body mass index (BMI) 36.0-36.9, adult: Secondary | ICD-10-CM | POA: Diagnosis not present

## 2021-03-03 DIAGNOSIS — Z20828 Contact with and (suspected) exposure to other viral communicable diseases: Secondary | ICD-10-CM | POA: Diagnosis not present

## 2021-03-11 ENCOUNTER — Encounter: Payer: Self-pay | Admitting: *Deleted

## 2021-03-19 ENCOUNTER — Other Ambulatory Visit: Payer: Self-pay | Admitting: Cardiovascular Disease

## 2021-04-03 ENCOUNTER — Encounter (HOSPITAL_COMMUNITY): Payer: Self-pay | Admitting: Gastroenterology

## 2021-04-13 ENCOUNTER — Encounter (HOSPITAL_COMMUNITY)
Admission: RE | Disposition: A | Discharge status: Home / Self Care | Payer: Self-pay | Source: Home / Self Care | Attending: Gastroenterology

## 2021-04-13 ENCOUNTER — Other Ambulatory Visit: Payer: Self-pay

## 2021-04-13 ENCOUNTER — Ambulatory Visit (HOSPITAL_COMMUNITY): Payer: PPO | Admitting: Registered Nurse

## 2021-04-13 ENCOUNTER — Encounter (HOSPITAL_COMMUNITY): Payer: Self-pay | Admitting: Gastroenterology

## 2021-04-13 ENCOUNTER — Ambulatory Visit (HOSPITAL_COMMUNITY)
Admission: RE | Admit: 2021-04-13 | Discharge: 2021-04-13 | Disposition: A | Discharge status: Home / Self Care | Payer: PPO | Attending: Gastroenterology | Admitting: Gastroenterology

## 2021-04-13 ENCOUNTER — Ambulatory Visit (HOSPITAL_BASED_OUTPATIENT_CLINIC_OR_DEPARTMENT_OTHER): Payer: PPO | Admitting: Registered Nurse

## 2021-04-13 DIAGNOSIS — K2289 Other specified disease of esophagus: Secondary | ICD-10-CM | POA: Diagnosis not present

## 2021-04-13 DIAGNOSIS — R932 Abnormal findings on diagnostic imaging of liver and biliary tract: Secondary | ICD-10-CM | POA: Insufficient documentation

## 2021-04-13 DIAGNOSIS — R1011 Right upper quadrant pain: Secondary | ICD-10-CM | POA: Diagnosis not present

## 2021-04-13 DIAGNOSIS — F419 Anxiety disorder, unspecified: Secondary | ICD-10-CM | POA: Insufficient documentation

## 2021-04-13 DIAGNOSIS — K219 Gastro-esophageal reflux disease without esophagitis: Secondary | ICD-10-CM | POA: Diagnosis not present

## 2021-04-13 DIAGNOSIS — F32A Depression, unspecified: Secondary | ICD-10-CM | POA: Diagnosis not present

## 2021-04-13 DIAGNOSIS — R948 Abnormal results of function studies of other organs and systems: Secondary | ICD-10-CM

## 2021-04-13 DIAGNOSIS — K3189 Other diseases of stomach and duodenum: Secondary | ICD-10-CM | POA: Diagnosis not present

## 2021-04-13 DIAGNOSIS — I899 Noninfective disorder of lymphatic vessels and lymph nodes, unspecified: Secondary | ICD-10-CM

## 2021-04-13 DIAGNOSIS — G4730 Sleep apnea, unspecified: Secondary | ICD-10-CM | POA: Insufficient documentation

## 2021-04-13 DIAGNOSIS — K869 Disease of pancreas, unspecified: Secondary | ICD-10-CM

## 2021-04-13 DIAGNOSIS — I2510 Atherosclerotic heart disease of native coronary artery without angina pectoris: Secondary | ICD-10-CM | POA: Insufficient documentation

## 2021-04-13 DIAGNOSIS — M069 Rheumatoid arthritis, unspecified: Secondary | ICD-10-CM | POA: Insufficient documentation

## 2021-04-13 DIAGNOSIS — Z87891 Personal history of nicotine dependence: Secondary | ICD-10-CM | POA: Diagnosis not present

## 2021-04-13 DIAGNOSIS — R109 Unspecified abdominal pain: Secondary | ICD-10-CM

## 2021-04-13 DIAGNOSIS — I10 Essential (primary) hypertension: Secondary | ICD-10-CM | POA: Insufficient documentation

## 2021-04-13 DIAGNOSIS — Z955 Presence of coronary angioplasty implant and graft: Secondary | ICD-10-CM | POA: Diagnosis not present

## 2021-04-13 DIAGNOSIS — F418 Other specified anxiety disorders: Secondary | ICD-10-CM | POA: Diagnosis not present

## 2021-04-13 HISTORY — PX: BIOPSY: SHX5522

## 2021-04-13 HISTORY — PX: ESOPHAGOGASTRODUODENOSCOPY: SHX5428

## 2021-04-13 HISTORY — PX: EUS: SHX5427

## 2021-04-13 SURGERY — UPPER ENDOSCOPIC ULTRASOUND (EUS) RADIAL
Anesthesia: Monitor Anesthesia Care

## 2021-04-13 MED ORDER — LACTATED RINGERS IV SOLN
INTRAVENOUS | Status: DC
  Administered 2021-04-13: 1000 mL via INTRAVENOUS

## 2021-04-13 MED ORDER — PROPOFOL 10 MG/ML IV BOLUS
INTRAVENOUS | Status: AC
  Filled 2021-04-13: qty 20

## 2021-04-13 MED ORDER — PROPOFOL 10 MG/ML IV BOLUS
INTRAVENOUS | Status: DC | PRN
  Administered 2021-04-13: 20 mg via INTRAVENOUS

## 2021-04-13 MED ORDER — PANTOPRAZOLE SODIUM 40 MG PO TBEC: 40.0000 mg | DELAYED_RELEASE_TABLET | Freq: Two times a day (BID) | ORAL | 5 refills | Status: DC

## 2021-04-13 MED ORDER — CLOPIDOGREL BISULFATE 75 MG PO TABS: 75.0000 mg | ORAL_TABLET | Freq: Every day | ORAL | 2 refills | Status: DC

## 2021-04-13 MED ORDER — SUCRALFATE 1 GM/10ML PO SUSP: 1.0000 g | Freq: Two times a day (BID) | ORAL | 1 refills | Status: DC

## 2021-04-13 MED ORDER — SODIUM CHLORIDE 0.9 % IV SOLN: INTRAVENOUS | Status: DC

## 2021-04-13 MED ORDER — PROPOFOL 1000 MG/100ML IV EMUL
INTRAVENOUS | Status: AC
  Filled 2021-04-13: qty 100

## 2021-04-13 MED ORDER — PROPOFOL 500 MG/50ML IV EMUL
INTRAVENOUS | Status: DC | PRN
Start: 2021-04-13 — End: 2021-04-13
  Administered 2021-04-13: 140 ug/kg/min via INTRAVENOUS

## 2021-04-13 NOTE — Transfer of Care (Signed)
Immediate Anesthesia Transfer of Care Note ? ?Patient: Richard Simpson ? ?Procedure(s) Performed: UPPER ENDOSCOPIC ULTRASOUND (EUS) RADIAL ?ESOPHAGOGASTRODUODENOSCOPY (EGD) ?BIOPSY ? ?Patient Location: PACU and Endoscopy Unit ? ?Anesthesia Type:MAC ? ?Level of Consciousness: awake, alert , oriented and patient cooperative ? ?Airway & Oxygen Therapy: Patient Spontanous Breathing and Patient connected to face mask oxygen ? ?Post-op Assessment: Report given to RN and Post -op Vital signs reviewed and stable ? ?Post vital signs: Reviewed and stable ? ?Last Vitals:  ?Vitals Value Taken Time  ?BP    ?Temp    ?Pulse 55 04/13/21 0838  ?Resp 15 04/13/21 0838  ?SpO2 100 % 04/13/21 0838  ?Vitals shown include unvalidated device data. ? ?Last Pain:  ?Vitals:  ? 04/13/21 0700  ?TempSrc: Oral  ?PainSc: 0-No pain  ?   ? ?  ? ?Complications: No notable events documented. ?

## 2021-04-13 NOTE — Anesthesia Preprocedure Evaluation (Addendum)
Anesthesia Evaluation  ?Patient identified by MRN, date of birth, ID band ?Patient awake ? ? ? ?Reviewed: ?Allergy & Precautions, NPO status , Patient's Chart, lab work & pertinent test results ? ?Airway ?Mallampati: II ? ?TM Distance: >3 FB ?Neck ROM: Full ? ? ? Dental ? ?(+) Teeth Intact, Dental Advisory Given ?  ?Pulmonary ?sleep apnea , former smoker,  ?  ?breath sounds clear to auscultation ? ? ? ? ? ? Cardiovascular ?hypertension, + CAD and + Cardiac Stents  ? ?Rhythm:Regular Rate:Normal ? ?Echo: ?1. Left ventricular ejection fraction, by estimation, is 65 to 70%. The  ?left ventricle has normal function. The left ventrical is not well  ?visualized to evaluate regional wall motion. There is mildly increased  ?left ventricular hypertrophy. Left  ?ventricular diastolic parameters are consistent with Grade I diastolic  ?dysfunction (impaired relaxation.)  ??2. Right ventricular systolic function is normal. The right ventricular  ?size is normal. There is normal pulmonary artery systolic pressure.  ??3. The mitral valve is abnormal. trivial mitral valve regurgitation. No  ?evidence of mitral stenosis.  ??4. The aortic valve is tricuspid. Aortic valve regurgitation is trivial .  ?No aortic stenosis is present.  ??5. The inferior vena cava is normal in size with greater than 50%  ?respiratory variability, suggesting right atrial pressure of 3 mmHg.  ?  ?Neuro/Psych ? Headaches, PSYCHIATRIC DISORDERS Anxiety Depression   ? GI/Hepatic ?Neg liver ROS, GERD  ,  ?Endo/Other  ?negative endocrine ROS ? Renal/GU ?negative Renal ROS  ? ?  ?Musculoskeletal ? ?(+) Arthritis , Rheumatoid disorders,   ? Abdominal ?(+) + obese,   ?Peds ? Hematology ?negative hematology ROS ?(+)   ?Anesthesia Other Findings ? ? Reproductive/Obstetrics ? ?  ? ? ? ? ? ? ? ? ? ? ? ? ? ?  ?  ? ? ? ? ? ? ? ?Anesthesia Physical ?Anesthesia Plan ? ?ASA: 3 ? ?Anesthesia Plan: MAC  ? ?Post-op Pain Management:    ? ?Induction: Intravenous ? ?PONV Risk Score and Plan: 0 and Propofol infusion ? ?Airway Management Planned: Natural Airway and Simple Face Mask ? ?Additional Equipment: None ? ?Intra-op Plan:  ? ?Post-operative Plan:  ? ?Informed Consent: I have reviewed the patients History and Physical, chart, labs and discussed the procedure including the risks, benefits and alternatives for the proposed anesthesia with the patient or authorized representative who has indicated his/her understanding and acceptance.  ? ? ? ? ? ?Plan Discussed with: CRNA ? ?Anesthesia Plan Comments:   ? ? ? ? ? ?Anesthesia Quick Evaluation ? ?

## 2021-04-13 NOTE — Op Note (Addendum)
[facility] ?Patient Name: Richard Simpson ?Procedure Date: 04/13/2021 ?MRN: [id number] ?Attending MD: Justice Britain , MD ?Date of Birth: [birth year] ?CSN: [id number] ?Age: 51 ?Admit Type: Outpatient ?Procedure:                Upper EUS ?Indications:              Abnormal MRCP, Abdominal pain in the right upper  ?                          quadrant ?Providers:                Justice Britain, MD, Burtis Junes, RN, Alphonzo Grieve  ?                          Leighton Roach, Technician ?Referring MD:             Norvel Richards, MD, [facility]  ?                          [facility], [facility] [place] ?Medicines:                Monitored Anesthesia Care ?Complications:            No immediate complications. ?Estimated Blood Loss:     Estimated blood loss was minimal. ?Procedure:                Pre-Anesthesia Assessment: ?                          - Prior to the procedure, a History and Physical  ?                          was performed, and patient medications and  ?                          allergies were reviewed. The patient's tolerance of  ?                          previous anesthesia was also reviewed. The risks  ?                          and benefits of the procedure and the sedation  ?                          options and risks were discussed with the patient.  ?                          All questions were answered, and informed consent  ?                          was obtained. Prior Anticoagulants: The patient has  ?                          taken Plavix (clopidogrel), last dose was 5 days  ?  prior to procedure. ASA Grade Assessment: III - A  ?                          patient with severe systemic disease. After  ?                          reviewing the risks and benefits, the patient was  ?                          deemed in satisfactory condition to undergo the  ?                          procedure. ?                          After obtaining informed consent, the endoscope was  ?                           passed under direct vision. Throughout the  ?                          procedure, the patient's blood pressure, pulse, and  ?                          oxygen saturations were monitored continuously. The  ?                          [id number]-[id number] ([id number]) Olympus endoscope was introduced  ?                          through the mouth, and advanced to the second part  ?                          of duodenum. The [id number]-[id number] ([id number]) Olympus  ?                          duodenoscope was introduced through the mouth, and  ?                          advanced to the area of papilla. The [id number]  ?                          ([id number]) Olympus linear ultrasound scope was  ?                          introduced through the mouth, and advanced to the  ?                          duodenum for ultrasound examination from the  ?                          stomach and duodenum. The upper EUS was  ?  accomplished without difficulty. The patient  ?                          tolerated the procedure. ?Scope In: ?Scope Out: ?Findings: ?     ENDOSCOPIC FINDING: : ?     No gross lesions were noted in the proximal esophagus and in the mid  ?     esophagus. ?     Two islands of salmon-colored mucosa were present from 39 to 40 cm. No  ?     other visible abnormalities were present. Biopsies were taken with a  ?     cold forceps for histology to rule in/out Barrett's. ?     The Z-line was irregular and was found 40 cm from the incisors. ?     Localized moderately erythematous mucosa without bleeding was found in  ?     the gastric antrum. ?     No other gross lesions were noted in the entire examined stomach.  ?     Biopsies were taken with a cold forceps for histology and Helicobacter  ?     pylori testing. ?     No gross lesions were noted in the duodenal bulb, in the first portion  ?     of the duodenum and in the second portion of the duodenum. Biopsies were  ?     taken with a cold forceps for  histology. ?     The major papilla was normal. ?     ENDOSONOGRAPHIC FINDING: : ?     Pancreatic parenchymal abnormalities were noted in the pancreatic head  ?     consisting of hyperechoic strands. ?     Pancreatic parenchymal abnormalities were noted in the genu of the  ?     pancreas, pancreatic body and pancreatic tail consisting of lobularity  ?     without honeycombing. ?     The pancreatic duct had a normal endosonographic appearance in the  ?     pancreatic head (2.0 mm), genu of the pancreas (1.3 mm), body of the  ?     pancreas (1.1 mm) and tail of the pancreas (0.9 mm). ?     There was no sign of significant endosonographic abnormality in the  ?     common bile duct (2.4 mm -> 3.7 mm) and in the common hepatic duct (5.4  ?     mm). No stones and ducts of normal caliber were identified. ?     Endosonographic imaging of the ampulla showed no extrinsic compression,  ?     intramural (subepithelial) lesion, mass, varices or wall thickening. ?     Endosonographic imaging in the visualized portion of the liver showed no  ?     mass. ?     No malignant-appearing lymph nodes were visualized in the celiac region  ?     (level 20), peripancreatic region and porta hepatis region. ?     The celiac region was visualized. ?Impression:               EGD Impression: ?                          - No gross lesions in esophagus proximally.  ?  Salmon-colored mucosa suspicious for Barrett's  ?                          esophagus distally - biopsied. ?                          - Z-line irregular, 40 cm from the incisors. ?                          - Moderately erythematous mucosa in the antrum. No  ?                          other gross lesions in the stomach. Biopsied. ?                          - No gross lesions in the duodenal bulb, in the  ?                          first portion of the duodenum and in the second  ?                          portion of the duodenum. Biopsied. ?                           - Normal major papilla. ?                          EUS Impression: ?                          - Pancreatic parenchymal abnormalities consisting  ?                          of hyperechoic strands were noted in the pancreatic  ?                          head. Pancreatic parenchymal abnormalities  ?                          consisting of lobularity were noted in the genu of  ?                          the pancreas, pancreatic body and pancreatic tail. ?                          - The pancreatic duct had a normal endosonographic  ?                          appearance in the pancreatic head, genu of the  ?                          pancreas, body of the pancreas and tail of the  ?  pancreas. ?                          - Together these changes do not meet criteria for  ?                          EUS guided diagnosis of Chronic Pancreatitis. They  ?                          can be suggestive at times however. ?                          - There was no sign of significant pathology in the  ?                          common bile duct and in the common hepatic duct. ?                          - No malignant-appearing lymph nodes were  ?                          visualized in the celiac region (level 20),  ?                          peripancreatic region and porta hepatis region. ?Moderate Sedation: ?     Not Applicable - Patient had care per Anesthesia. ?Recommendation:           - The patient will be observed post-procedure,  ?                          until all discharge criteria are met. ?                          - Discharge patient to home. ?                          - Patient has a contact number available for  ?                          emergencies. The signs and symptoms of potential  ?                          delayed complications were discussed with the  ?                          patient. Return to normal activities tomorrow.  ?                          Written discharge instructions were  provided to the  ?                          patient. ?                          - Resume previous diet. ?                          -  Observe patient's clinical course. ?                          - Await path re

## 2021-04-13 NOTE — Discharge Instructions (Addendum)
YOU HAD AN ENDOSCOPIC PROCEDURE TODAY: Refer to the procedure report and other information in the discharge instructions given to you for any specific questions about what was found during the examination. If this information does not answer your questions, please call [place] office at [phone] to clarify.  ? ?YOU SHOULD EXPECT: Some feelings of bloating in the abdomen. Passage of more gas than usual. Walking can help get rid of the air that was put into your GI tract during the procedure and reduce the bloating. If you had a lower endoscopy (such as a colonoscopy or flexible sigmoidoscopy) you may notice spotting of blood in your stool or on the toilet paper. Some abdominal soreness may be present for a day or two, also. ? ?DIET: Your first meal following the procedure should be a light meal and then it is ok to progress to your normal diet. A half-sandwich or bowl of soup is an example of a good first meal. Heavy or fried foods are harder to digest and may make you feel nauseous or bloated. Drink plenty of fluids but you should avoid alcoholic beverages for 24 hours. If you had a esophageal dilation, please see attached instructions for diet.   ? ?ACTIVITY: Your care partner should take you home directly after the procedure. You should plan to take it easy, moving slowly for the rest of the day. You can resume normal activity the day after the procedure however YOU SHOULD NOT DRIVE, use power tools, machinery or perform tasks that involve climbing or major physical exertion for 24 hours (because of the sedation medicines used during the test).  ? ?SYMPTOMS TO REPORT IMMEDIATELY: ?A gastroenterologist can be reached at any hour. Please call [phone]  for any of the following symptoms:  ?Following lower endoscopy (colonoscopy, flexible sigmoidoscopy) ?Excessive amounts of blood in the stool  ?Significant tenderness, worsening of abdominal pains  ?Swelling of the abdomen that is new, acute  ?Fever of 100? or  higher  ?Following upper endoscopy (EGD, EUS, ERCP, esophageal dilation) ?Vomiting of blood or coffee ground material  ?New, significant abdominal pain  ?New, significant chest pain or pain under the shoulder blades  ?Painful or persistently difficult swallowing  ?New shortness of breath  ?Black, tarry-looking or red, bloody stools ? ?FOLLOW UP:  ?If any biopsies were taken you will be contacted by phone or by letter within the next 1-3 weeks. Call [phone]  if you have not heard about the biopsies in 3 weeks.  ?Please also call with any specific questions about appointments or follow up tests. 7 ?

## 2021-04-13 NOTE — Anesthesia Postprocedure Evaluation (Signed)
Anesthesia Post Note ? ?Patient: Richard Simpson ? ?Procedure(s) Performed: UPPER ENDOSCOPIC ULTRASOUND (EUS) RADIAL ?ESOPHAGOGASTRODUODENOSCOPY (EGD) ?BIOPSY ? ?  ? ?Patient location during evaluation: PACU ?Anesthesia Type: MAC ?Level of consciousness: awake and alert ?Pain management: pain level controlled ?Vital Signs Assessment: post-procedure vital signs reviewed and stable ?Respiratory status: spontaneous breathing, nonlabored ventilation, respiratory function stable and patient connected to nasal cannula oxygen ?Cardiovascular status: stable and blood pressure returned to baseline ?Postop Assessment: no apparent nausea or vomiting ?Anesthetic complications: no ? ? ?No notable events documented. ? ?Last Vitals:  ?Vitals:  ? 04/13/21 0908 04/13/21 0911  ?BP: 99/75 116/83  ?Pulse: (!) 50 (!) 50  ?Resp: 13 17  ?Temp:    ?SpO2: 97% 95%  ?  ?Last Pain:  ?Vitals:  ? 04/13/21 0911  ?TempSrc:   ?PainSc: 0-No pain  ? ? ?  ?  ?  ?  ?  ?  ? ?Effie Berkshire ? ? ? ? ?

## 2021-04-13 NOTE — H&P (Signed)
GASTROENTEROLOGY PROCEDURE H&P NOTE   Primary Care Physician: [facility]  HPI: Richard Simpson is a 51 y.o. male who presents for EGD/EUS to evaluate pancreas and abdominal pain.  Past Medical History:  Diagnosis Date   Anxiety    CAD S/P percutaneous coronary angioplasty 10/2013   a. 10/2013 Cath: mod LAD dzs->nl FFR-> medically managed;  b. 06/2014 NSTEMI/PCi: LAD 99ost, 70p, 64m (entire area covered with a 3.5x20 Synergy DES), LCX nl, RCA 33m, 10d, EF 45-50%. c. 10/2015: cath showing patent LAD stent with 15% Prox LAD stenosis. EF 55-65%.    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 06/2014 EF 45-50% by LV gram b. Echo July 2016: EF 60-65%. No RWMA. Gr 1 DD    NSTEMI (non-ST elevated myocardial infarction) (HCC) 06/2014   Obesity (BMI 30-39.9) 10/29/2013   Primary snoring 10/29/2013   Rheumatoid arthritis (HCC) 09/23/2013   RLS (restless legs syndrome) 10/29/2013   Sleep apnea    Past Surgical History:  Procedure Laterality Date   BIOPSY  09/27/2018   Procedure: BIOPSY;  Surgeon: Corbin Ade, MD;  [facility];  Service: Endoscopy;;   CARDIAC CATHETERIZATION N/A 07/05/2014   Procedure: Left Heart Cath and Coronary Angiography;  Surgeon: Lennette Bihari, MD;  Location: [facility] INVASIVE CV LAB;  Service: Cardiovascular;  NSTEMI --> Ostial LAd 99%, prox 80% & early mid 50% --> PCI   CARDIAC CATHETERIZATION  07/05/2014   Procedure: Coronary Stent Intervention;  Surgeon: Lennette Bihari, MD;  Location: [facility] INVASIVE CV LAB;  Service: Cardiovascular; Ostial-prox LAD tandem 99, 80 & 50% --> Synergy DES 3.5 x 20 (3.74)   CARDIAC CATHETERIZATION N/A 03/24/2015    Procedure: Left Heart Cath and Coronary Angiography;  Surgeon: Marykay Lex, MD;  Location: [facility] INVASIVE CV LAB;  Service: Cardiovascular;  Laterality: N/A;   CARDIAC CATHETERIZATION N/A 11/20/2015   Procedure: Left Heart Cath and Coronary Angiography;  Surgeon: Lennette Bihari, MD;  Location: [facility] INVASIVE CV LAB;  Service: Cardiovascular;  Laterality: N/A;   CHOLECYSTECTOMY     10 yrs ago-jenkins   COLONOSCOPY N/A 03/23/2013   Procedure: COLONOSCOPY;  Surgeon: Corbin Ade, MD;  [facility];  Service: Endoscopy;  Laterality: N/A;  2:15-moved to 1030 Staff notified pt   COLONOSCOPY WITH PROPOFOL N/A 06/10/2016   Procedure: COLONOSCOPY WITH PROPOFOL;  Surgeon: Corbin Ade, MD;  [facility];  Service: Endoscopy;  Laterality: N/A;  730    ESOPHAGOGASTRODUODENOSCOPY (EGD) WITH PROPOFOL N/A 09/27/2018   Procedure: ESOPHAGOGASTRODUODENOSCOPY (EGD) WITH PROPOFOL;  Surgeon: Corbin Ade, MD;  [facility];  Service: Endoscopy;  Laterality: N/A;  1:30pm   FRACTIONAL FLOW RESERVE WIRE  11/23/2013   Procedure: FRACTIONAL FLOW RESERVE WIRE;  Surgeon: Corky Crafts, MD;  Location: [facility] CATH LAB;  Service: Cardiovascular;;   GALLBLADDER SURGERY     LEFT HEART CATHETERIZATION WITH CORONARY ANGIOGRAM N/A 11/23/2013   Procedure: LEFT HEART CATHETERIZATION WITH CORONARY ANGIOGRAM;  Surgeon: Corky Crafts, MD;  Location: [facility] CATH LAB;  Service: Cardiovascular;  Laterality: N/A;   MASS EXCISION  09/09/2010  Procedure: EXCISION MASS;  Surgeon: Fuller Canada, MD;  Location: [facility] ORS;  Service: Orthopedics;  Laterality: Left;  Excision Mass Left Long Finger   POLYPECTOMY  06/10/2016   Procedure: POLYPECTOMY;  Surgeon: Corbin Ade, MD;  [facility];  Service: Endoscopy;;  colon   RIGHT/LEFT HEART CATH AND CORONARY ANGIOGRAPHY N/A 04/06/2019   Procedure: RIGHT/LEFT HEART CATH AND CORONARY ANGIOGRAPHY;  Surgeon: Swaziland, Peter M, MD;  Location: [facility] INVASIVE  CV LAB;  Service: Cardiovascular;  Laterality: N/A;   THROAT SURGERY     nodules removed from throat as child   TRANSTHORACIC ECHOCARDIOGRAM   July 2016:    EF 60-65%. No RWMA. Gr 1 DD   Current Facility-Administered Medications  Medication Dose Route Frequency Provider Last Rate Last Admin   0.9 %  sodium chloride infusion   Intravenous Continuous Rachael Fee, MD       lactated ringers infusion   Intravenous Continuous Mansouraty, Netty Starring., MD 10 mL/hr at 04/13/21 0726 1,000 mL at 04/13/21 1027    Current Facility-Administered Medications:    0.9 %  sodium chloride infusion, , Intravenous, Continuous, Rachael Fee, MD   lactated ringers infusion, , Intravenous, Continuous, Mansouraty, Netty Starring., MD, Last Rate: 10 mL/hr at 04/13/21 0726, 1,000 mL at 04/13/21 2536 Allergies  Allergen Reactions   Lisinopril Cough   Prednisone Other (See Comments)    Makes angry   Family History  Problem Relation Age of Onset   Hypertension Mother    Diabetes Mother    Arthritis Paternal Uncle    Cancer Maternal Grandfather    Hypertension Maternal Grandmother    Diabetes Maternal Grandmother    Diabetes Other    Diabetes Other    Diabetes Paternal Grandfather    Anesthesia problems Neg Hx    Hypotension Neg Hx    Malignant hyperthermia Neg Hx    Pseudochol deficiency Neg Hx    Heart attack Neg Hx    Stroke Neg Hx    Social History   Socioeconomic History   Marital status: Married    Spouse name: Renee   Number of children: 2   Years of education: 12th grade   Highest education level: Not on file  Occupational History   Occupation: Retired Civil engineer, contracting: [employer]  Tobacco Use   Smoking status: Former    Packs/day: 0.25    Years: 15.00    Pack years: 3.75    Types: Cigarettes    Quit date: 09/04/2003    Years since quitting: 17.6   Smokeless tobacco: Current    Types: Chew  Vaping Use   Vaping Use: Never used  Substance and Sexual  Activity   Alcohol use: Yes    Alcohol/week: 1.0 - 2.0 standard drink    Types: 1 - 2 Cans of beer per week    Comment: rare   Drug use: No   Sexual activity: Yes  Other Topics Concern   Not on file  Social History Narrative   Patient lives at home with his wife Luster Landsberg).    Patient works full time Marketing executive..   Education high school.   Right handed.   Caffeine one cup of coffee daily and one soda.   Social Determinants of Health   Financial Resource Strain: Not on file  Food Insecurity: Not on file  Transportation Needs: Not on file  Physical Activity: Not on file  Stress: Not on file  Social Connections:  Not on file  Intimate Partner Violence: Not on file    Physical Exam: Today's Vitals   04/13/21 0700  BP: 126/83  Pulse: (!) 53  Resp: 19  Temp: 97.9 F (36.6 C)  TempSrc: Oral  SpO2: 100%  Weight: 117.9 kg  Height: 6\' 1"  (1.854 m)  PainSc: 0-No pain   Body mass index is 34.3 kg/m. GEN: NAD EYE: Sclerae anicteric ENT: MMM CV: Non-tachycardic GI: Soft, NT/ND NEURO:  Alert & Oriented x 3  Lab Results: No results for input(s): WBC, HGB, HCT, PLT in the last 72 hours. BMET No results for input(s): NA, K, CL, CO2, GLUCOSE, BUN, CREATININE, CALCIUM in the last 72 hours. LFT No results for input(s): PROT, ALBUMIN, AST, ALT, ALKPHOS, BILITOT, BILIDIR, IBILI in the last 72 hours. PT/INR No results for input(s): LABPROT, INR in the last 72 hours.   Impression / Plan: This is a 51 y.o.male who presents for EGD/EUS to evaluate pancreas and abdominal pain.  The risks of an EUS including intestinal perforation, bleeding, infection, aspiration, and medication effects were discussed as was the possibility it may not give a definitive diagnosis if a biopsy is performed.  When a biopsy of the pancreas is done as part of the EUS, there is an additional risk of pancreatitis at the rate of about 1-2%.  It was explained that procedure related pancreatitis is  typically mild, although it can be severe and even life threatening, which is why we do not perform random pancreatic biopsies and only biopsy a lesion/area we feel is concerning enough to warrant the risk.   The risks and benefits of endoscopic evaluation/treatment were discussed with the patient and/or family; these include but are not limited to the risk of perforation, infection, bleeding, missed lesions, lack of diagnosis, severe illness requiring hospitalization, as well as anesthesia and sedation related illnesses.  The patient's history has been reviewed, patient examined, no change in status, and deemed stable for procedure.  The patient and/or family is agreeable to proceed.    Corliss Parish, MD [facility] Advanced Endoscopy Office # [phone]

## 2021-04-14 ENCOUNTER — Encounter: Payer: Self-pay | Admitting: Gastroenterology

## 2021-04-14 ENCOUNTER — Encounter (HOSPITAL_COMMUNITY): Payer: Self-pay | Admitting: Gastroenterology

## 2021-04-14 LAB — SURGICAL PATHOLOGY

## 2021-04-15 ENCOUNTER — Telehealth: Payer: Self-pay | Admitting: Gastroenterology

## 2021-04-15 NOTE — Telephone Encounter (Signed)
Please make appt with Dr. Gala Romney in 4-6 weeks for follow up EGD/EUS. ?

## 2021-04-25 DIAGNOSIS — Z20822 Contact with and (suspected) exposure to covid-19: Secondary | ICD-10-CM | POA: Diagnosis not present

## 2021-04-28 ENCOUNTER — Other Ambulatory Visit: Payer: Self-pay | Admitting: Pulmonary Disease

## 2021-04-28 DIAGNOSIS — J849 Interstitial pulmonary disease, unspecified: Secondary | ICD-10-CM

## 2021-04-29 ENCOUNTER — Ambulatory Visit (INDEPENDENT_AMBULATORY_CARE_PROVIDER_SITE_OTHER): Payer: PPO | Admitting: Pulmonary Disease

## 2021-04-29 ENCOUNTER — Encounter: Payer: Self-pay | Admitting: Pulmonary Disease

## 2021-04-29 VITALS — BP 118/72 | HR 60 | Temp 98.6°F | Ht 73.0 in | Wt 264.6 lb

## 2021-04-29 DIAGNOSIS — J849 Interstitial pulmonary disease, unspecified: Secondary | ICD-10-CM

## 2021-04-29 DIAGNOSIS — G4733 Obstructive sleep apnea (adult) (pediatric): Secondary | ICD-10-CM | POA: Diagnosis not present

## 2021-04-29 LAB — PULMONARY FUNCTION TEST
DL/VA % pred: 113 %
DL/VA: 4.99 ml/min/mmHg/L
DLCO cor % pred: 87 %
DLCO cor: 28.44 ml/min/mmHg
DLCO unc % pred: 87 %
DLCO unc: 28.44 ml/min/mmHg
FEF 25-75 Post: 4.84 L/sec
FEF 25-75 Pre: 4.01 L/sec
FEF2575-%Change-Post: 20 %
FEF2575-%Pred-Post: 127 %
FEF2575-%Pred-Pre: 105 %
FEV1-%Change-Post: 3 %
FEV1-%Pred-Post: 79 %
FEV1-%Pred-Pre: 76 %
FEV1-Post: 3.46 L
FEV1-Pre: 3.34 L
FEV1FVC-%Change-Post: 3 %
FEV1FVC-%Pred-Pre: 110 %
FEV6-%Change-Post: 0 %
FEV6-%Pred-Post: 71 %
FEV6-%Pred-Pre: 71 %
FEV6-Post: 3.89 L
FEV6-Pre: 3.88 L
FEV6FVC-%Pred-Post: 103 %
FEV6FVC-%Pred-Pre: 103 %
FVC-%Change-Post: 0 %
FVC-%Pred-Post: 69 %
FVC-%Pred-Pre: 69 %
FVC-Post: 3.89 L
FVC-Pre: 3.88 L
Post FEV1/FVC ratio: 89 %
Post FEV6/FVC ratio: 100 %
Pre FEV1/FVC ratio: 86 %
Pre FEV6/FVC Ratio: 100 %
RV % pred: 84 %
RV: 1.84 L
TLC % pred: 79 %
TLC: 6.03 L

## 2021-04-29 NOTE — Assessment & Plan Note (Signed)
FVC has dropped slightly although TLC is stable as is diffusion. ?We will obtain high-resolution CT chest to clarify but overall appears stable ILD. ?I want Asimia and encouraged him to be active and maintain aerobic exercise. ?We will continue on CellCept and methylprednisone per rheumatology ?

## 2021-04-29 NOTE — Assessment & Plan Note (Signed)
CPAP download was reviewed which shows excellent control of events on 9 cm with great compliance more than 10 hours per night. ?CPAP is only helped decrease his daytime somnolence and fatigue and he is very compliant ? ?Weight loss encouraged, compliance with goal of at least 4-6 hrs every night is the expectation. ?Advised against medications with sedative side effects ?Cautioned against driving when sleepy - understanding that sleepiness will vary on a day to day basis ? ?

## 2021-04-29 NOTE — Patient Instructions (Signed)
?  X High res CT chest at [facility] ?

## 2021-04-29 NOTE — Progress Notes (Signed)
? ?  Subjective:  ? ? Patient ID: Richard Simpson, male    DOB: [birth year], 51 y.o.   MRN: [id number] ? ?HPI ? ?51 yo ex-smoker for follow-up of RA- ILD and OSA. ?  ?PMH - 06/2013 NSTEMI, PTCA to LAD, EF 45% ?COVID infection 2022, required infusion ? 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 ?He had a flareup 12/2016, improved with prednisone and was put on Remicade and then finally CellCept as a steroid sparing agent ? ?Chief Complaint  ?Patient presents with  ? Follow-up  ?  Follow up from January. Pt states that overall his breathing is good with no issues noted. Pt is on Albuterol as needed. Pt had full PFT done today   ? ? ?Last OV with [facility] 01/2021 -Metoprolol decreased to 25 due to brady ?Breathing is stable overall, he has occasional cough.  He does like to go hunting but when he walks in the woods he occasionally gets chest pain and he has PTSD from his previous cardiac history. ?He is maintained on CellCept and methylprednisone 4 mg due to his intolerance for prednisone ? ?We reviewed PFTs today. ?CPAP machine is working well and denies any problems with mask or pressure ? ? ?Significant tests/ events reviewed ? ?PFTs 04/2021 showed FVC of 69%, FEV1 76%, ratio 86, TLC 79%, DLCO 87% ?PFTs 06/21/19- FEV1 3.91 (88%), ratio 88, DLCO 32.62 ( 99%) - stable  ? ?HRCT 06/18/19 minimal ground glass opacity significantly improved from 2018. ? ?PFTs 10/2017 FVC 83%, DLCO 98% ?  ?HRCT 12/2016 >> worsening multifocal areas of ground-glass attenuation, most confluent in the left upper lobe, but with progressive involvement in the lower lobes of the lungs bilaterally as well. ?  ?06/25/2014 CT angio chest >> Patchy regions of ground-glass attenuation opacity 62m & 6cm in the medial aspect of the left upper lobe with associated volume loss ?09/2014 CT chest >Decrease in  size of ground-glass attenuating opacity within the ?perihilar left upper lobe compatible with a benign, likely infectious or inflammatory process. ?  ?Split NPSG  12/2016 -wt 275 pounds -  AHI 60/hour with lowest desaturation 75%.  This was corrected by CPAP 8 cm with nasal pillows ?  ?PFTs 12/2013 >>no obs,FVC 77%, nml DLCO -  Mild restriction ?effect of obesity  ?  ?PFT 12/2016  moderate restriction . FEV1 70%, ratio 86, FVC 63%, DLCO 74%. ?  ? ? ? ?Review of Systems ?neg for any significant sore throat, dysphagia, itching, sneezing, nasal congestion or excess/ purulent secretions, fever, chills, sweats, unintended wt loss, pleuritic or exertional cp, hempoptysis, orthopnea pnd or change in chronic leg swelling. Also denies presyncope, palpitations, heartburn, abdominal pain, nausea, vomiting, diarrhea or change in bowel or urinary habits, dysuria,hematuria, rash, arthralgias, visual complaints, headache, numbness weakness or ataxia. ? ?   ?Objective:  ? Physical Exam ? ?Gen. Pleasant, obese, in no distress ?ENT - no lesions, no post nasal drip ?Neck: No JVD, no thyromegaly, no carotid bruits ?Lungs: no use of accessory muscles, no dullness to percussion, bibasal fine crackles ?Cardiovascular: Rhythm regular, heart sounds  normal, no murmurs or gallops, no peripheral edema ?Musculoskeletal: No deformities, no cyanosis or clubbing , no tremors ? ? ? ?   ?Assessment & Plan:  ? ? ?

## 2021-04-29 NOTE — Progress Notes (Signed)
PFT done today. 

## 2021-05-19 ENCOUNTER — Ambulatory Visit (INDEPENDENT_AMBULATORY_CARE_PROVIDER_SITE_OTHER): Payer: PPO | Admitting: Internal Medicine

## 2021-05-19 ENCOUNTER — Encounter: Payer: Self-pay | Admitting: Internal Medicine

## 2021-05-19 VITALS — BP 120/84 | HR 56 | Temp 96.2°F | Ht 73.0 in | Wt 263.4 lb

## 2021-05-19 DIAGNOSIS — K219 Gastro-esophageal reflux disease without esophagitis: Secondary | ICD-10-CM | POA: Diagnosis not present

## 2021-05-19 DIAGNOSIS — R109 Unspecified abdominal pain: Secondary | ICD-10-CM

## 2021-05-19 MED ORDER — NORTRIPTYLINE HCL 10 MG PO CAPS: 10.0000 mg | ORAL_CAPSULE | Freq: Every day | ORAL | 3 refills | Status: DC

## 2021-05-19 NOTE — Patient Instructions (Signed)
It was good to see you again today! ? ?As discussed, you can decrease your pantoprazole or Protonix to 40 mg 30 minutes before breakfast once daily ? ?We will try nortriptyline at low-dose (10 mg) at bedtime in the hopes this will diminish your right-sided abdominal discomfort.  It may help with other pain as well.  Dispense 30 with 3 refills. ?This dose is very low.  This medication is in the antidepressant category.  However, you are not being treated for depression.  This is medication has been shown to decrease pain symptoms. ?It may take 6 to 8 weeks before you start seeing a benefit. ? ?You had a very small polyp removed in 2018; latest guidelines recommend a 7-year follow-up examination (colonoscopy 2025) ? ?Office visit with me in 3 months ? ? ?

## 2021-05-19 NOTE — Progress Notes (Signed)
? ? ?Primary Care Physician:  [facility] ?Primary Gastroenterologist:  Dr.  Marland Kitchen ?Pre-Procedure History & Physical: ?HPI:  Richard Simpson is a 52 y.o. male here for follow-up of intermittent right-sided abdominal pain, GERD, history of adenomatous colon polyp. ?Patient with longstanding intermittent right upper quadrant abdominal pain.  Gallbladder long gone.  GERD effectively managed with twice daily pantoprazole.  Has not had any GERD in some time.  He was placed on twice daily therapy after his pancreatic EUS (which was negative) previously.  Could not tell a difference in his symptoms.  No dysphagia.  Very vague intermittent postprandial component.  Is able to go on about his daily routine with pain which is not as bad as it was previously. ?Small adenoma right colon removed 2019; due for surveillance 2025. ?Mild bump in transaminases previously noted.  Last assay 3 months ago completely normal.  Gallbladder out. ? ? ?Past Medical History:  ?Diagnosis Date  ? Anxiety   ? CAD S/P percutaneous coronary angioplasty 10/2013  ? a. 10/2013 Cath: mod LAD dzs->nl FFR-> medically managed;  b. 06/2014 NSTEMI/PCi: LAD 99ost, 70p, 13m(entire area covered with a 3.5x20 Synergy DES), LCX nl, RCA 291m10d, EF 45-50%. c. 10/2015: cath showing patent LAD stent with 15% Prox LAD stenosis. EF 55-65%.   ? DEGENERATIVE DISC DISEASE, THORACIC SPINE 09/16/2009  ? Qualifier: Diagnosis of  By: HaAline BrochureD, StDorothyann Peng  ? Depression   ? Dyslipidemia, goal LDL below 70   ? Essential hypertension   ? GERD (gastroesophageal reflux disease)   ? Headache   ? History of kidney stones   ? Interstitial lung disease ([facility]  ? Ischemic cardiomyopathy - Resolved 06/2014  ? a. 06/2014 EF 45-50% by LV gram b. Echo July 2016: EF 60-65%. No RWMA. Gr 1 DD   ? NSTEMI (non-ST elevated myocardial infarction) ([facility]6/2016  ? Obesity (BMI 30-39.9) 10/29/2013  ? Primary snoring 10/29/2013  ? Rheumatoid arthritis ([place]8/30/2015  ? RLS (restless legs  syndrome) 10/29/2013  ? Sleep apnea   ? ? ?Past Surgical History:  ?Procedure Laterality Date  ? BIOPSY  09/27/2018  ? Procedure: BIOPSY;  Surgeon: RoDaneil DolinMD;  [facility];  Service: Endoscopy;;  ? BIOPSY  04/13/2021  ? Procedure: BIOPSY;  Surgeon: MaIrving Copas MD;  [facility];  Service: Endoscopy;;  ? CARDIAC CATHETERIZATION N/A 07/05/2014  ? Procedure: Left Heart Cath and Coronary Angiography;  Surgeon: ThTroy SineMD;  [facility];  Service: Cardiovascular;  NSTEMI --> Ostial LAd 99%, prox 80% & early mid 50% --> PCI  ? CARDIAC CATHETERIZATION  07/05/2014  ? Procedure: Coronary Stent Intervention;  Surgeon: ThTroy SineMD;  [facility];  Service: Cardiovascular; Ostial-prox LAD tandem 99, 80 & 50% --> Synergy DES 3.5 x 20 (3.74)  ? CARDIAC CATHETERIZATION N/A 03/24/2015  ? Procedure: Left Heart Cath and Coronary Angiography;  Surgeon: DaLeonie ManMD;  [facility];  Service: Cardiovascular;  Laterality: N/A;  ? CARDIAC CATHETERIZATION N/A 11/20/2015  ? Procedure: Left Heart Cath and Coronary Angiography;  Surgeon: ThTroy SineMD;  [facility];  Service: Cardiovascular;  Laterality: N/A;  ? CHOLECYSTECTOMY    ? 10 yrs ago-jenkins  ? COLONOSCOPY N/A 03/23/2013  ? Procedure: COLONOSCOPY;  Surgeon: RoDaneil DolinMD;  [facility];  Service: Endoscopy;  Laterality: N/A;  2:15-moved to 1030 Staff  notified pt  ? COLONOSCOPY WITH PROPOFOL N/A 06/10/2016  ? Procedure: COLONOSCOPY WITH PROPOFOL;  Surgeon: Daneil Dolin, MD;  [facility];  Service: Endoscopy;  Laterality: N/A;  730 ?  ? ESOPHAGOGASTRODUODENOSCOPY N/A 04/13/2021  ? Procedure: ESOPHAGOGASTRODUODENOSCOPY (EGD);  Surgeon: Rush Landmark Telford Nab., MD;  [facility];  Service: Endoscopy;  Laterality: N/A;  ? ESOPHAGOGASTRODUODENOSCOPY (EGD) WITH PROPOFOL N/A 09/27/2018  ? Procedure: ESOPHAGOGASTRODUODENOSCOPY  (EGD) WITH PROPOFOL;  Surgeon: Daneil Dolin, MD;  [facility];  Service: Endoscopy;  Laterality: N/A;  1:30pm  ? EUS N/A 04/13/2021  ? Procedure: UPPER ENDOSCOPIC ULTRASOUND (EUS) RADIAL;  Surgeon: Rush Landmark Telford Nab., MD;  [facility];  Service: Endoscopy;  Laterality: N/A;  ? FRACTIONAL FLOW RESERVE WIRE  11/23/2013  ? Procedure: FRACTIONAL FLOW RESERVE WIRE;  Surgeon: Jettie Booze, MD;  Location: [facility] CATH LAB;  Service: Cardiovascular;;  ? GALLBLADDER SURGERY    ? LEFT HEART CATHETERIZATION WITH CORONARY ANGIOGRAM N/A 11/23/2013  ? Procedure: LEFT HEART CATHETERIZATION WITH CORONARY ANGIOGRAM;  Surgeon: Jettie Booze, MD;  Location: [facility] CATH LAB;  Service: Cardiovascular;  Laterality: N/A;  ? MASS EXCISION  09/09/2010  ? Procedure: EXCISION MASS;  Surgeon: Arther Abbott, MD;  [facility];  Service: Orthopedics;  Laterality: Left;  Excision Mass Left Long Finger  ? POLYPECTOMY  06/10/2016  ? Procedure: POLYPECTOMY;  Surgeon: Daneil Dolin, MD;  [facility];  Service: Endoscopy;;  colon  ? RIGHT/LEFT HEART CATH AND CORONARY ANGIOGRAPHY N/A 04/06/2019  ? Procedure: RIGHT/LEFT HEART CATH AND CORONARY ANGIOGRAPHY;  Surgeon: Martinique, Peter M, MD;  Location: [place] CV LAB;  Service: Cardiovascular;  Laterality: N/A;  ? THROAT SURGERY    ? nodules removed from throat as child  ? TRANSTHORACIC ECHOCARDIOGRAM   July 2016:  ?  EF 60-65%. No RWMA. Gr 1 DD  ? ? ?Prior to Admission medications   ?Medication Sig Start Date End Date Taking? Authorizing Provider  ?albuterol (PROAIR HFA) 108 (90 Base) MCG/ACT inhaler Inhale 2 puffs into the lungs every 6 (six) hours as needed for wheezing or shortness of breath. 04/02/19  Yes Martyn Ehrich, NP  ?amLODipine (NORVASC) 5 MG tablet Take 1 tablet (5 mg total) by mouth daily. 10/09/20  Yes Warren Lacy, PA-C  ?aspirin EC 81 MG tablet Take 1 tablet (81 mg total) by mouth daily. 12/04/13  Yes Burtis Junes, NP   ?atorvastatin (LIPITOR) 80 MG tablet TAKE (1) TABLET BY MOUTH ONCE DAILY. 09/12/19  Yes Troy Sine, MD  ?clonazePAM (KLONOPIN) 1 MG tablet Take 1 mg by mouth 3 (three) times daily as needed for anxiety. Take '1mg'$  tablet by mouth scheduled at bedtime, may take an additional dose if needed for anxiety   Yes [provider]  ?clopidogrel (PLAVIX) 75 MG tablet Take 1 tablet (75 mg total) by mouth daily. 04/14/21  Yes Mansouraty, Telford Nab., MD  ?ezetimibe (ZETIA) 10 MG tablet Take 10 mg by mouth daily.   Yes [provider]  ?furosemide (LASIX) 20 MG tablet Take 1 tablet (20 mg total) by mouth daily. ?Patient taking differently: Take 20 mg by mouth daily as needed for edema. 11/03/20  Yes Warren Lacy, PA-C  ?loratadine (CLARITIN) 10 MG tablet Take 10 mg by mouth daily.    Yes [provider]  ?losartan (COZAAR) 25 MG tablet Take 0.5 tablets (12.5 mg total) by mouth daily. Take in the evening 10/09/20  Yes Warren Lacy,  PA-C  ?metoprolol tartrate (LOPRESSOR) 25 MG tablet TAKE 1 AND 1/2 TABLET BY MOUTH TWICE DAILY. 03/19/21  Yes Troy Sine, MD  ?mycophenolate (CELLCEPT) 500 MG tablet Take 1,000 mg by mouth 2 (two) times daily. Takes 2 tablets in the morning and 2 tablets in the evening (2,000 mg total) 04/28/17  Yes [provider]  ?nitroGLYCERIN (NITROSTAT) 0.4 MG SL tablet DISSOLVE 1 TABLET UNDER TONGUE FOR CHEST TIGHTNESS AND SHORTNESS OF BREATH. MAY TAKE EVERY 5 MINUTES UP TO 3 DOSES. IF NO RELIEF AFTER 3 DOS 12/10/20  Yes Troy Sine, MD  ?pantoprazole (PROTONIX) 40 MG tablet Take 1 tablet (40 mg total) by mouth 2 (two) times daily before a meal. 04/13/21  Yes Mansouraty, Telford Nab., MD  ?ranolazine (RANEXA) 500 MG 12 hr tablet Take 1 tablet (500 mg total) by mouth 2 (two) times daily. 12/12/20  Yes Jettie Booze, MD  ?traMADol (ULTRAM) 50 MG tablet Take 50 mg by mouth every 6 (six) hours as needed for moderate pain.    Yes [provider]   ? ? ?Allergies as of 05/19/2021 - Review Complete 05/19/2021  ?Allergen Reaction Noted  ? Lisinopril Cough 07/12/2014  ? Prednisone Other (See Comments) 10/29/2013  ? ? ?Family History  ?Problem Relation A

## 2021-05-21 DIAGNOSIS — J849 Interstitial pulmonary disease, unspecified: Secondary | ICD-10-CM | POA: Diagnosis not present

## 2021-05-21 DIAGNOSIS — M5440 Lumbago with sciatica, unspecified side: Secondary | ICD-10-CM | POA: Diagnosis not present

## 2021-05-21 DIAGNOSIS — E669 Obesity, unspecified: Secondary | ICD-10-CM | POA: Diagnosis not present

## 2021-05-21 DIAGNOSIS — I2510 Atherosclerotic heart disease of native coronary artery without angina pectoris: Secondary | ICD-10-CM | POA: Diagnosis not present

## 2021-05-21 DIAGNOSIS — Z6836 Body mass index (BMI) 36.0-36.9, adult: Secondary | ICD-10-CM | POA: Diagnosis not present

## 2021-05-21 DIAGNOSIS — L570 Actinic keratosis: Secondary | ICD-10-CM | POA: Diagnosis not present

## 2021-05-21 DIAGNOSIS — M0609 Rheumatoid arthritis without rheumatoid factor, multiple sites: Secondary | ICD-10-CM | POA: Diagnosis not present

## 2021-05-27 ENCOUNTER — Other Ambulatory Visit: Payer: Self-pay | Admitting: Cardiovascular Disease

## 2021-06-10 ENCOUNTER — Ambulatory Visit (HOSPITAL_COMMUNITY)
Admission: RE | Admit: 2021-06-10 | Discharge: 2021-06-10 | Disposition: A | Discharge status: Home / Self Care | Payer: PPO | Source: Ambulatory Visit | Attending: Pulmonary Disease | Admitting: Pulmonary Disease

## 2021-06-10 DIAGNOSIS — I700 Atherosclerosis of aorta: Secondary | ICD-10-CM | POA: Diagnosis not present

## 2021-06-10 DIAGNOSIS — Z9049 Acquired absence of other specified parts of digestive tract: Secondary | ICD-10-CM | POA: Diagnosis not present

## 2021-06-10 DIAGNOSIS — J849 Interstitial pulmonary disease, unspecified: Secondary | ICD-10-CM | POA: Insufficient documentation

## 2021-06-10 DIAGNOSIS — I2510 Atherosclerotic heart disease of native coronary artery without angina pectoris: Secondary | ICD-10-CM | POA: Diagnosis not present

## 2021-06-12 ENCOUNTER — Encounter: Payer: Self-pay | Admitting: Cardiovascular Disease

## 2021-06-12 NOTE — Telephone Encounter (Signed)
Spoke to patient Dr.Kelly reviewed pulmonary ct.He advised continue same medications.Last lipid panel LDL 49.Advised you are due to see Dr.Kelly.Appointment scheduled with [facility] 5/24 at 2:40 pm to discuss.

## 2021-06-12 NOTE — Telephone Encounter (Signed)
Patient saw the 06/10/21 pulmonary CT notes in MyChart and wanted Dr. Claiborne Billings to review it regarding patient's heart. Please advise.

## 2021-06-17 ENCOUNTER — Ambulatory Visit (INDEPENDENT_AMBULATORY_CARE_PROVIDER_SITE_OTHER): Payer: PPO | Admitting: Cardiovascular Disease

## 2021-06-17 ENCOUNTER — Encounter: Payer: Self-pay | Admitting: Cardiovascular Disease

## 2021-06-17 DIAGNOSIS — I10 Essential (primary) hypertension: Secondary | ICD-10-CM | POA: Diagnosis not present

## 2021-06-17 DIAGNOSIS — G4733 Obstructive sleep apnea (adult) (pediatric): Secondary | ICD-10-CM | POA: Diagnosis not present

## 2021-06-17 DIAGNOSIS — I25119 Atherosclerotic heart disease of native coronary artery with unspecified angina pectoris: Secondary | ICD-10-CM | POA: Diagnosis not present

## 2021-06-17 DIAGNOSIS — E785 Hyperlipidemia, unspecified: Secondary | ICD-10-CM

## 2021-06-17 DIAGNOSIS — M05749 Rheumatoid arthritis with rheumatoid factor of unspecified hand without organ or systems involvement: Secondary | ICD-10-CM

## 2021-06-17 DIAGNOSIS — E668 Other obesity: Secondary | ICD-10-CM | POA: Diagnosis not present

## 2021-06-17 DIAGNOSIS — J849 Interstitial pulmonary disease, unspecified: Secondary | ICD-10-CM | POA: Diagnosis not present

## 2021-06-17 NOTE — Progress Notes (Signed)
Cardiology Office Note    Date:  06/24/2021   ID:  [name], DOB August 16, 1970, MRN [id number]  PCP:  [facility]  Cardiologist:  [name], MD   No chief complaint on file.   History of Present Illness:  Richard Simpson is a 51 y.o. male who presents for a 26 month follow-up evaluation.  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 He developed severe chest pain the evening of July 04, 2014 leading to his [facility] hospitalization on the morning of June 10.  He was seen in consultation by Dr. Harl Bowie and transferred for urgent cardiac catheterization which was done by me. This revealed a subtotal 99+ percent ostial LAD stenosis followed by proximal 70% and 50% stenoses.  Initial ejection fraction was 45% with anterolateral hypocontractility.  He underwent successful intervention with insertion of Synergy DES 3.520 mm stent placed from the LAD ostium to cover all 3 lesions.  Subsequently, he noticed marked benefit in his previous exertional shortness of breath symptomatology.  He was seen  In follow-up by Murray Hodgkins, NP and due to and ACE-induced cough was started on losartan in place of lisinopril. He has a history of arthritic disease and in the past had been treated for probable psoriatic arthritis but he is now felt to have rheumatoid arthritis.   An echo Doppler study on 08/09/2014 which mild focal basal hypertrophy of the septum.  There are no wall motion abnormalities.  Ejection fraction was 60-65%.  There was grade 1 diastolic dysfunction.  The mitral valve was mildly calcified at its annulus and there was trivial aortic insufficiency.  He participated in cardiac rehabilitation last year.   He developed recurrent episodes of chest tightness and underwent repeat cardiac catheterization on 03/24/2015 by Dr. Ellyn Hack which showed a widely patent stent at the ostium  and proximal LAD segment.  There was mild 35% narrowing after the takeoff of the first diagonal vessel.  His circumflex and RCA were considered to be normal.  His LVEDP was mildly increased.  It was felt that there was no new evidence for a new CAD lesion to explain his symptoms.  It was felt that possibly his symptoms were related to microvascular disease versus diastolic dysfunction.  Subsequently, he was started on Lasix 20 mg.  He also has been taking amlodipine 5 mg, losartan 25 mg, metoprolol tartrate 25 mg twice a day.  He continues to be on aspirin 81 mg and Brilinta 90 mg twice a day.  His wife was very concerned about this 35% LAD stenosis.    When I saw him in March 2017, I titrated his losartan from 25 mg to 37.5 mg and recommended addition of Zetia to his 80 mg atorvastatin in attempt to induce further plaque regression.  He underwent an echo Doppler study on 04/28/2015 which showed an ejection fraction at 60-65%.  He did not have any regional wall motion abnormalities.  He had mild left atrial dilatation.  He was felt to have normal diastolic function.  On subsequent evaluation he continued, he has continued to experience episodes of chest discomfort which he believes occurs with activity.  Due to potential microvascular component to symptomatology I added nitrate therapy.  I referred him for a cardiopulmonary met test which did not reveal significant cardiac or pulmonary limitation.    He was seen by Rosaria Ferries, PAC, and because of  increasing recurrent chest pain.  He underwent repeat cardiac catheterization by me 11/20/2015.  This did not demonstrate any residual CAD and had a widely patent LAD stent extending from the ostium ending prior to the takeoff of the first diagonal vessel.  There was mild 15% narrowing in the LAD after diagonal vessel which was improved from previous evaluation.  His circumflex, intermediate, and dominant RCA vessels were normal.    He was seen in June 2018 by  Melvyn Neth and was seen by Almyra Deforest on 12/28/2016.  His isosorbide was increased to 30 mg and he has noticed improvement and has not taken subungual nitroglycerin.  He had developed shortness of breath with hunting.  She referred him for an echo Doppler study on 01/06/2017, which showed an EF 50-55% with anteroapical, septal hypokinesis and grade 1 diastolic dysfunction.      I had not seen him since February 2017 saw him on January 13, 2017.  He also has undergone pulmonary evaluation and was found to have restrictive physiology on PFTs.  He has been followed by Dr. Elsworth Soho and a high resolution CT scan showed worsening of multifocal areas of groundglass attenuation most confluent in the left upper lobe but with progressive involvement in the lower lobes of the lungs bilaterally as well.  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  He is now off Remicade and is on CellCept.   When I saw him in the office in April 2019 he was doing well and was closely followed by pulmonology for interstitial lung disease.  He also has rheumatoid arthritis.  He was doing well on Imdur, Norvasc, metoprolol and low-dose losartan. He continued to be on aspirin and Plavix. He notes significant improvement in his shortness of breath.  He has lost over 40 pounds.  He is breathing better.  He denies recurrent anginal symptoms.    He was later seen in October 2019 by Fabian Sharp, [place] and remained stable and his last cardiology evaluation was with Almyra Deforest, PA in November 2020. Due to the COVID-19, he had gained some weight  However he denied any significant cardiac issue.  He continued to have some chest discomfort with more strenuous activities such as hunting, however no chest discomfort with regular daily activity.   It was recommended that he continue his current cardiac medications.   Mr. Sroka was remaining stable from a cardiac  standpoint however around [place] of this year his whole family as well as his son's girlfriends family all came down with Covid.  He states he never tested positive but he had similar symptomatology to all his family members and actually had the worst symptoms.  His oxygen decreased slightly to 90.  He was advised hospitalization but declined.  He was treated with steroids and Z-Pak after chest x-ray showed changes and he was found to have some interstitial edema.  He did not receive remdemsivir.  He received Lasix 80 mg for several days with improvement in his shortness of breath.  Prior to his most likely Covid episode, he states he was only taking Lasix as needed for some shortness of breath.  Since his symptoms he has required daily Lasix of at least 40 or 60 mg ever since.  He continues to experience residual fatigue.  He has obstructive sleep apnea and is continued to use CPAP therapy.  He will be undergoing a follow-up high  resolution chest CT for his interstitial lung disease.  He denies any of his previous anginal symptoms.  He admits to weight gain and fatigability with exercise.  He has been trying to walk on a treadmill but often after 5 minutes he typically has to stop.  I last saw him on 04/16/2021. Since his last telemedicine visit with me on February 23, 2019 he underwent an echo Doppler study on March 07, 2019 which showed an EF of 65 to 70%.  There was grade 1 diastolic dysfunction.  He was seen by Bunnie Domino, NP on April 02, 2019 with complaints of almost daily chest pain with minimal exertion.  He had been evaluated in the emergency room.  In light of recurrent symptomatology he was scheduled for an outpatient cardiac catheterization which was done on April 06, 2019 by Dr. Martinique and showed widely patent proximal LAD stent and proximal to mid 35% LAD stenosis.  Subsequently, he has felt relieved and has had improvement in his prior chest pain.  He had been increasing his Lasix for  fluid retention and also is undergoing evaluation for interstitial lung disease and is to have a high-resolution CT scan.   Since my last evaluation, he has been seen by pulmonary and is followed by Dr. Elsworth Soho.  He is felt to have interstitial lung disease.  He is not on Remicade.  A recent CT of his chest showed stable findings in his lungs which were favored to reflect very mild chronic postinfection or inflammatory scarring.  There was no imaging findings to suggest progressive interstitial lung disease.  He was noted to have aortic atherosclerosis in addition to left main and two-vessel calcification.  Presently, he denies any recurrent chest pain.  He typically walks 30 minutes on a treadmill at least 3 to 4 days/week.  He tolerates 30 minutes but if he exercises for 40 minutes he notes increased shortness of breath.  [facility] who checks laboratory.  He continues to use CPAP and goes to bed between 11 PM and midnight and wakes up at 7 AM.  He is on amlodipine 5 mg, losartan 12.5 mg, metoprolol tartrate 37.5 mg twice a day and Ranexa 500 mg twice a day [place].  He is on CellCept for rheumatoid arthritis.  He presents for follow-up evaluation.  Past Medical History:  Diagnosis Date   Anxiety    CAD S/P percutaneous coronary angioplasty 10/2013   a. 10/2013 Cath: mod LAD dzs->nl FFR-> medically managed;  b. 06/2014 NSTEMI/PCi: LAD 99ost, 70p, 16m(entire area covered with a 3.5x20 Synergy DES), LCX nl, RCA 256m10d, EF 45-50%. c. 10/2015: cath showing patent LAD stent with 15% Prox LAD stenosis. EF 55-65%.    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 06/2014 EF 45-50% by LV gram b. Echo July 2016: EF 60-65%. No RWMA. Gr 1 DD    NSTEMI (non-ST elevated myocardial infarction) ([facility]6/2016   Obesity (BMI 30-39.9) 10/29/2013   Primary snoring 10/29/2013   Rheumatoid arthritis ([facility]8/30/2015   RLS (restless legs syndrome) 10/29/2013   Sleep apnea  Past Surgical History:  Procedure Laterality Date   BIOPSY  09/27/2018   Procedure: BIOPSY;  Surgeon: Daneil Dolin, MD;  [facility];  Service: Endoscopy;;   BIOPSY  04/13/2021   Procedure: BIOPSY;  Surgeon: Irving Copas., MD;  [facility];  Service: Endoscopy;;   CARDIAC CATHETERIZATION N/A 07/05/2014   Procedure: Left Heart Cath and Coronary Angiography;  Surgeon: Troy Sine, MD;  Location: [place] [facility];  Service: Cardiovascular;  NSTEMI --> Ostial LAd 99%, prox 80% & early mid 50% --> PCI   CARDIAC CATHETERIZATION  07/05/2014   Procedure: Coronary Stent Intervention;  Surgeon: Troy Sine, MD;  Location: [place] [facility];  Service: Cardiovascular; Ostial-prox LAD tandem 99, 80 & 50% --> Synergy DES 3.5 x 20 (3.74)   CARDIAC CATHETERIZATION N/A 03/24/2015   Procedure: Left Heart Cath and Coronary Angiography;  Surgeon: Leonie Man, MD;  Location: [place] [facility];  Service: Cardiovascular;  Laterality: N/A;   CARDIAC CATHETERIZATION N/A 11/20/2015   Procedure: Left Heart Cath and Coronary Angiography;  Surgeon: Troy Sine, MD;  Location: [place] [facility];  Service: Cardiovascular;  Laterality: N/A;   CHOLECYSTECTOMY     10 yrs ago-jenkins   COLONOSCOPY N/A 03/23/2013   Procedure: COLONOSCOPY;  Surgeon: Daneil Dolin, MD;  [facility];  Service: Endoscopy;  Laterality: N/A;  2:15-moved to 1030 Staff notified pt   COLONOSCOPY WITH PROPOFOL N/A 06/10/2016   Procedure: COLONOSCOPY WITH PROPOFOL;  Surgeon: Daneil Dolin, MD;  [facility];  Service: Endoscopy;  Laterality: N/A;  730    ESOPHAGOGASTRODUODENOSCOPY  N/A 04/13/2021   Procedure: ESOPHAGOGASTRODUODENOSCOPY (EGD);  Surgeon: Rush Landmark Telford Nab., MD;  [facility];  Service: Endoscopy;  Laterality: N/A;   ESOPHAGOGASTRODUODENOSCOPY (EGD) WITH PROPOFOL N/A 09/27/2018   Procedure: ESOPHAGOGASTRODUODENOSCOPY (EGD) WITH PROPOFOL;  Surgeon: Daneil Dolin, MD;  [facility];  Service: Endoscopy;  Laterality: N/A;  1:30pm   EUS N/A 04/13/2021   Procedure: UPPER ENDOSCOPIC ULTRASOUND (EUS) RADIAL;  Surgeon: Rush Landmark Telford Nab., MD;  [facility];  Service: Endoscopy;  Laterality: N/A;   FRACTIONAL FLOW RESERVE WIRE  11/23/2013   Procedure: FRACTIONAL FLOW RESERVE WIRE;  Surgeon: Jettie Booze, MD;  Location[facility];  Service: Cardiovascular;;   GALLBLADDER SURGERY     LEFT HEART CATHETERIZATION WITH CORONARY ANGIOGRAM N/A 11/23/2013   Procedure: LEFT HEART CATHETERIZATION WITH CORONARY ANGIOGRAM;  Surgeon: Jettie Booze, MD;  Location[facility] C[facility] LAB;  Service: Cardiovascular;  Laterality: N/A;   MASS EXCISION  09/09/2010   Procedure: EXCISION MASS;  Surgeon: Arther Abbott, MD;  [facility];  Service: Orthopedics;  Laterality: Left;  Excision Mass Left Long Finger   POLYPECTOMY  06/10/2016   Procedure: POLYPECTOMY;  Surgeon: Daneil Dolin, MD;  [facility];  Service: Endoscopy;;  colon   RIGHT/LEFT HEART CATH AND CORONARY ANGIOGRAPHY N/A 04/06/2019   Procedure: RIGHT/LEFT HEART CATH AND CORONARY ANGIOGRAPHY;  Surgeon: Martinique, Peter M, MD;  Location: [place] [facility];  Service: Cardiovascular;  Laterality: N/A;   THROAT SURGERY     nodules removed from throat as child   TRANSTHORACIC ECHOCARDIOGRAM   July 2016:    EF 60-65%. No RWMA. Gr 1 DD    Current Medications: Outpatient Medications Prior to Visit  Medication Sig Dispense Refill   amLODipine (NORVASC) 5 MG tablet Take 1 tablet (5 mg total) by mouth daily. 45 tablet 2  aspirin EC 81 MG tablet Take 1 tablet (81 mg total) by  mouth daily. 30 tablet 3   atorvastatin (LIPITOR) 80 MG tablet TAKE (1) TABLET BY MOUTH ONCE DAILY. 90 tablet 3   clonazePAM (KLONOPIN) 1 MG tablet Take 1 mg by mouth 3 (three) times daily as needed for anxiety. Take 15m tablet by mouth scheduled at bedtime, may take an additional dose if needed for anxiety     clopidogrel (PLAVIX) 75 MG tablet Take 1 tablet (75 mg total) by mouth daily. 90 tablet 2   ezetimibe (ZETIA) 10 MG tablet Take 10 mg by mouth daily.     furosemide (LASIX) 20 MG tablet Take 1 tablet (20 mg total) by mouth daily. (Patient taking differently: Take 20 mg by mouth daily as needed for edema.) 90 tablet 3   loratadine (CLARITIN) 10 MG tablet Take 10 mg by mouth daily.      losartan (COZAAR) 25 MG tablet Take 0.5 tablets (12.5 mg total) by mouth daily. Take in the evening 135 tablet 1   methylPREDNISolone (MEDROL) 4 MG tablet Take 4 mg by mouth daily.     metoprolol tartrate (LOPRESSOR) 25 MG tablet TAKE 1 AND 1/2 TABLET BY MOUTH TWICE DAILY. 90 tablet 5   mycophenolate (CELLCEPT) 500 MG tablet Take 1,000 mg by mouth 2 (two) times daily. Takes 2 tablets in the morning and 2 tablets in the evening (2,000 mg total)     nitroGLYCERIN (NITROSTAT) 0.4 MG SL tablet DISSOLVE 1 TABLET UNDER TONGUE FOR CHEST TIGHTNESS AND SHORTNESS OF BREATH. MAY TAKE EVERY 5 MINUTES UP TO 3 DOSES. IF NO RELIEF AFTER 3 DOS 25 tablet 0   nortriptyline (PAMELOR) 10 MG capsule Take 1 capsule (10 mg total) by mouth at bedtime. 30 capsule 3   pantoprazole (PROTONIX) 40 MG tablet Take 1 tablet (40 mg total) by mouth 2 (two) times daily before a meal. 60 tablet 5   ranolazine (RANEXA) 500 MG 12 hr tablet Take 1 tablet (500 mg total) by mouth 2 (two) times daily. 60 tablet 6   traMADol (ULTRAM) 50 MG tablet Take 50 mg by mouth every 6 (six) hours as needed for moderate pain.      albuterol (PROAIR HFA) 108 (90 Base) MCG/ACT inhaler Inhale 2 puffs into the lungs every 6 (six) hours as needed for wheezing or shortness  of breath. (Patient not taking: Reported on 06/17/2021) 8 g 0   No facility-administered medications prior to visit.     Allergies:   Lisinopril and Prednisone   Social History   Socioeconomic History   Marital status: Married    Spouse name: Renee   Number of children: 2   Years of education: 12th grade   Highest education level: Not on file  Occupational History   Occupation: Retired pChemical engineer [employer]  Tobacco Use   Smoking status: Former    Packs/day: 0.25    Years: 15.00    Pack years: 3.75    Types: Cigarettes    Quit date: 09/04/2003    Years since quitting: 17.8   Smokeless tobacco: Current    Types: Chew  Vaping Use   Vaping Use: Never used  Substance and Sexual Activity   Alcohol use: Yes    Alcohol/week: 2.0 - 3.0 standard drinks    Types: 2 - 3 Cans of beer per week    Comment: rare   Drug use: No   Sexual activity: Yes  Other Topics  Concern   Not on file  Social History Narrative   Patient lives at home with his wife [name]).    Patient works full time Dentist..   Education high school.   Right handed.   Caffeine one cup of coffee daily and one soda.   Social Determinants of Health   Financial Resource Strain: Not on file  Food Insecurity: Not on file  Transportation Needs: Not on file  Physical Activity: Not on file  Stress: Not on file  Social Connections: Not on file     Family History:  The patient's family history includes Arthritis in his paternal uncle; Cancer in his maternal grandfather; Diabetes in his maternal grandmother, mother, paternal grandfather, and other family members; Hypertension in his maternal grandmother and mother.   ROS General: Negative; No fevers, chills, or night sweats;  HEENT: Negative; No changes in vision or hearing, sinus congestion, difficulty swallowing Pulmonary: Mild interstitial lung disease; history of Covid infection,  Cardiovascular: See HPI GI: Negative; No  nausea, vomiting, diarrhea, or abdominal pain GU: Negative; No dysuria, hematuria, or difficulty voiding Musculoskeletal: Rheumatoid arthritis Hematologic/Oncology: Negative; no easy bruising, bleeding Endocrine: Negative; no heat/cold intolerance; no diabetes Neuro: Negative; no changes in balance, headaches Skin: Negative; No rashes or skin lesions Psychiatric: Negative; No behavioral problems, depression Sleep: Positive for OSA on CPAP followed by  pulmonary Other comprehensive 14 point system review is negative.   PHYSICAL EXAM:   VS:  BP 110/78   Pulse 67   Ht '6\' 1"'  (1.854 m)   Wt 265 lb 12.8 oz (120.6 kg)   SpO2 99%   BMI 35.07 kg/m     Repeat blood pressure by me 110/74  Wt Readings from Last 3 Encounters:  06/17/21 265 lb 12.8 oz (120.6 kg)  05/19/21 263 lb 6.4 oz (119.5 kg)  04/29/21 264 lb 9.6 oz (120 kg)     General: Alert, oriented, no distress.  Skin: normal turgor, no rashes, warm and dry HEENT: Normocephalic, atraumatic. Pupils equal round and reactive to light; sclera anicteric; extraocular muscles intact;  Nose without nasal septal hypertrophy Mouth/Parynx benign; Mallinpatti scale 3 Neck: No JVD, no carotid bruits; normal carotid upstroke Lungs: clear to ausculatation and percussion; no wheezing or rales Chest wall: without tenderness to palpitation Heart: PMI not displaced, RRR, s1 s2 normal, 1/6 systolic murmur, no diastolic murmur, no rubs, gallops, thrills, or heaves Abdomen: soft, nontender; no hepatosplenomehaly, BS+; abdominal aorta nontender and not dilated by palpation. Back: no CVA tenderness Pulses 2+ Musculoskeletal: full range of motion, normal strength, no joint deformities Extremities: no clubbing cyanosis or edema, Homan's sign negative  Neurologic: grossly nonfocal; Cranial nerves grossly wnl Psychologic: Normal mood and affect     Studies/Labs Reviewed:   Jun 17, 2021 ECG (independently read by me): NSR at 67, no ectopy  April 17, 2019 ECG (independently read by me): NSR at 68; no ectopy.  Early transition normal intervals  December 15, 2018 ECG (independently read by me): Normal sinus rhythm at 66 bpm, no ectopy.  Normal intervals.  Recent Labs:    Latest Ref Rng & Units 12/12/2020   10:58 AM 04/06/2019    8:03 AM 04/06/2019    7:56 AM  BMP  Glucose 70 - 99 mg/dL 88      BUN 6 - 24 mg/dL 14      Creatinine 0.76 - 1.27 mg/dL 1.04      BUN/Creat Ratio 9 - 20 13  Sodium 134 - 144 mmol/L 140   142   142    Potassium 3.5 - 5.2 mmol/L 4.2   3.9   3.9    Chloride 96 - 106 mmol/L 101      CO2 20 - 29 mmol/L 24      Calcium 8.7 - 10.2 mg/dL 9.0            Latest Ref Rng & Units 12/22/2020    8:25 AM 12/17/2020   12:08 PM 12/12/2020   10:58 AM  Hepatic Function  Total Protein 6.0 - 8.5 g/dL 6.4   7.0   6.7    Albumin 4.0 - 5.0 g/dL 4.4   4.4   4.6    AST 0 - 40 IU/L 21   28   32    ALT 0 - 44 IU/L 37   48   53    Alk Phosphatase 44 - 121 IU/L 83   68   82    Total Bilirubin 0.0 - 1.2 mg/dL 1.1   1.9   1.2    Bilirubin, Direct 0.00 - 0.40 mg/dL 0.32   0.3          Latest Ref Rng & Units 12/12/2020   10:58 AM 04/06/2019    8:03 AM 04/06/2019    7:56 AM  CBC  WBC 3.4 - 10.8 x10E3/uL 7.6      Hemoglobin 13.0 - 17.7 g/dL 16.7   14.6   14.3    Hematocrit 37.5 - 51.0 % 48.3   43.0   42.0    Platelets 150 - 450 x10E3/uL 193       Lab Results  Component Value Date   MCV 93 12/12/2020   MCV 98.2 04/02/2019   MCV 92 03/07/2019   Lab Results  Component Value Date   TSH 1.890 03/07/2019   No results found for: HGBA1C   BNP    Component Value Date/Time   BNP 23.0 04/02/2019 0007    ProBNP    Component Value Date/Time   PROBNP 23 12/12/2020 1058   PROBNP 18.0 03/19/2015 1034     Lipid Panel     Component Value Date/Time   CHOL 102 11/17/2020 0819   TRIG 129 11/17/2020 0819   HDL 30 (L) 11/17/2020 0819   CHOLHDL 3.4 11/17/2020 0819   CHOLHDL 3.7 04/16/2015 0820   VLDL 14  04/16/2015 0820   LDLCALC 49 11/17/2020 0819   LABVLDL 23 11/17/2020 0819     RADIOLOGY: CT Chest High Resolution  Result Date: 06/11/2021 CLINICAL DATA:  51 year old male with history of interstitial lung disease. Follow-up study. EXAM: CT CHEST WITHOUT CONTRAST TECHNIQUE: Multidetector CT imaging of the chest was performed following the standard protocol without intravenous contrast. High resolution imaging of the lungs, as well as inspiratory and expiratory imaging, was performed. RADIATION DOSE REDUCTION: This exam was performed according to the departmental dose-optimization program which includes automated exposure control, adjustment of the mA and/or kV according to patient size and/or use of iterative reconstruction technique. COMPARISON:  High-resolution chest CT 06/18/2019. FINDINGS: Cardiovascular: Heart size is normal. There is no significant pericardial fluid, thickening or pericardial calcification. There is aortic atherosclerosis, as well as atherosclerosis of the great vessels of the mediastinum and the coronary arteries, including calcified atherosclerotic plaque in the left main, left anterior descending and right coronary arteries. Mediastinum/Nodes: No pathologically enlarged mediastinal or hilar lymph nodes. Esophagus is unremarkable in appearance. No axillary lymphadenopathy. Lungs/Pleura: High-resolution images again demonstrate some  very mild dependent areas of ground-glass attenuation in the lungs bilaterally. These appear essentially stable compared to the prior study. No other generalized areas of ground-glass attenuation, septal thickening, subpleural reticulation, parenchymal banding, traction bronchiectasis or honeycombing noted. Inspiratory and expiratory imaging is unremarkable. No acute consolidative airspace disease. No pleural effusions. No suspicious appearing pulmonary nodules or masses are noted. Upper Abdomen: Status post cholecystectomy. Musculoskeletal: There are no  aggressive appearing lytic or blastic lesions noted in the visualized portions of the skeleton. IMPRESSION: 1. Stable findings in the lungs, favored to reflect very mild chronic post infectious or inflammatory scarring. No imaging findings to suggest progressive interstitial lung disease at this time. 2. Aortic atherosclerosis, in addition to left main and 2 vessel coronary artery disease. Please note that although the presence of coronary artery calcium documents the presence of coronary artery disease, the severity of this disease and any potential stenosis cannot be assessed on this non-gated CT examination. Assessment for potential risk factor modification, dietary therapy or pharmacologic therapy may be warranted, if clinically indicated. Aortic Atherosclerosis (ICD10-I70.0). Electronically Signed   By: Vinnie Langton M.D.   On: 06/11/2021 06:22      Additional studies/ records that were reviewed today include:   Echo 03/07/2019 IMPRESSIONS   1. Left ventricular ejection fraction, by estimation, is 65 to 70%. The  left ventricle has normal function. The left ventrical is not well  visualized to evaluate regional wall motion. There is mildly increased  left ventricular hypertrophy. Left  ventricular diastolic parameters are consistent with Grade I diastolic  dysfunction (impaired relaxation.)   2. Right ventricular systolic function is normal. The right ventricular  size is normal. There is normal pulmonary artery systolic pressure.   3. The mitral valve is abnormal. trivial mitral valve regurgitation. No  evidence of mitral stenosis.   4. The aortic valve is tricuspid. Aortic valve regurgitation is trivial .  No aortic stenosis is present.   5. The inferior vena cava is normal in size with greater than 50%  respiratory variability, suggesting right atrial pressure of 3 mmHg.   Comparison(s): A prior study was performed on 01/06/2017. Prior images  unable to be directly viewed,  comparison made by report only. Comparison  of reports suggests improved LV function. Unable to assess regional wall  motion, however LVEF appears  hyperdynamic today.    ASSESSMENT:    1. Coronary artery disease involving native coronary artery of native heart with angina pectoris ([place])   2. Essential hypertension   3. Hyperlipidemia LDL goal <70   4. ILD (interstitial lung disease) ([place])   5. Rheumatoid arthritis involving hand with positive rheumatoid factor, unspecified laterality ([place])   6. OSA (obstructive sleep apnea)   7. Moderate obesity      PLAN:  1.  CAD: Mr. Elms suffered a non-ST segment elevation MI on July 05, 2014 secondary to 99% ostial LAD stenosis.  He underwent successful DES stenting with a 3.5 x 20 mm Synergy stent which covered 3 LAD lesions.  Repeat catheterization in February 2017 due to recurrent chest pain revealed widely patent stent with mild 35% nonobstructive LAD disease.  He had recurrent chest pain In 2021 and repeat catheterization on symptomatology earlier this year and underwent most recent catheterization on April 06, 2019 showed a widely patent LAD stent without significant change in other coronary anatomy.  A nuclear perfusion study for recurrent symptomatology in July 2022 was low risk and showed normal perfusion.  When seen in November 2022  by Dr.Varansi Ranexa 500 mg twice a day was added to his regimen with benefit.  Presently he is without anginal symptomatology.  2.  Exertional dyspnea: Probably contributed by diastolic dysfunction as well as his interstitial lung disease.  He had a Covid infection.  His last echo Doppler study in February 2021 showed  hyperdynamic LV function with EF 65 to 70% with grade 1 diastolic dysfunction.  .  3.  Interstitial lung disease: He is followed by Dr. Eartha Inch.  Most recent chest CT was reviewed without significant interval change.  5.  Hyperlipidemia: Target LDL less than 70 in this patient with CAD.  He currently  continues to be on atorvastatin 80 mg and Zetia 10 mg daily.  6.  OSA: On CPAP with 100% compliance  7.  Obesity: BMI currently 35.07.  Again discussed the importance of weight loss.  Discussed Mediterranean diet.  He exercises at least 30 minutes on a treadmill at a pace of 3 mph.   Medication Adjustments/Labs and Tests Ordered: Current medicines are reviewed at length with the patient today.  Concerns regarding medicines are outlined above.  Medication changes, Labs and Tests ordered today are listed in the Patient Instructions below. Patient Instructions  Medication Instructions:  The current medical regimen is effective;  continue present plan and medications as directed. Please refer to the Current Medication list given to you today.   *If you need a refill on your cardiac medications before your next appointment, please call your pharmacy*  Lab Work:   Testing/Procedures:  NONE    NONE If you have labs (blood work) drawn today and your tests are completely normal, you will receive your results only by: [place] (if you have MyChart) OR  A paper copy in the mail If you have any lab test that is abnormal or we need to change your treatment, we will call you to review the results.  Follow-Up: Your next appointment:  12 month(s) In Person with [name], MD     Please call our office 2 months in advance to schedule this appointment   At [facility], you and your health needs are our priority.  As part of our continuing mission to provide you with exceptional heart care, we have created designated Provider Care Teams.  These Care Teams include your primary Cardiologist (physician) and Advanced Practice Providers (APPs -  Physician Assistants and Nurse Practitioners) who all work together to provide you with the care you need, when you need it.    Important Information About Sugar             Signed, [name], MD  06/24/2021 9:25 AM    [facility] [address], [place], [place], [place]  [address] Phone: [phone]

## 2021-06-17 NOTE — Patient Instructions (Signed)
Medication Instructions:  The current medical regimen is effective;  continue present plan and medications as directed. Please refer to the Current Medication list given to you today.   *If you need a refill on your cardiac medications before your next appointment, please call your pharmacy*  Lab Work:   Testing/Procedures:  NONE    NONE If you have labs (blood work) drawn today and your tests are completely normal, you will receive your results only by: MyChart Message (if you have MyChart) OR  A paper copy in the mail If you have any lab test that is abnormal or we need to change your treatment, we will call you to review the results.  Follow-Up: Your next appointment:  12 month(s) In Person with Richard Kelly, MD   Please call our office 2 months in advance to schedule this appointment   At [facility], you and your health needs are our priority.  As part of our continuing mission to provide you with exceptional heart care, we have created designated Provider Care Teams.  These Care Teams include your primary Cardiologist (physician) and Advanced Practice Providers (APPs -  Physician Assistants and Nurse Practitioners) who all work together to provide you with the care you need, when you need it.   Important Information About Sugar           

## 2021-06-24 ENCOUNTER — Encounter: Payer: Self-pay | Admitting: Cardiovascular Disease

## 2021-07-15 DIAGNOSIS — R7301 Impaired fasting glucose: Secondary | ICD-10-CM | POA: Diagnosis not present

## 2021-07-15 DIAGNOSIS — E782 Mixed hyperlipidemia: Secondary | ICD-10-CM | POA: Diagnosis not present

## 2021-07-15 DIAGNOSIS — R5383 Other fatigue: Secondary | ICD-10-CM | POA: Diagnosis not present

## 2021-07-15 DIAGNOSIS — E7849 Other hyperlipidemia: Secondary | ICD-10-CM | POA: Diagnosis not present

## 2021-07-15 DIAGNOSIS — R861 Abnormal level of hormones in specimens from male genital organs: Secondary | ICD-10-CM | POA: Diagnosis not present

## 2021-07-15 DIAGNOSIS — I10 Essential (primary) hypertension: Secondary | ICD-10-CM | POA: Diagnosis not present

## 2021-07-15 DIAGNOSIS — K219 Gastro-esophageal reflux disease without esophagitis: Secondary | ICD-10-CM | POA: Diagnosis not present

## 2021-07-20 DIAGNOSIS — R7301 Impaired fasting glucose: Secondary | ICD-10-CM | POA: Diagnosis not present

## 2021-07-20 DIAGNOSIS — R4582 Worries: Secondary | ICD-10-CM | POA: Diagnosis not present

## 2021-07-20 DIAGNOSIS — M05741 Rheumatoid arthritis with rheumatoid factor of right hand without organ or systems involvement: Secondary | ICD-10-CM | POA: Diagnosis not present

## 2021-07-20 DIAGNOSIS — Z6835 Body mass index (BMI) 35.0-35.9, adult: Secondary | ICD-10-CM | POA: Diagnosis not present

## 2021-07-20 DIAGNOSIS — J301 Allergic rhinitis due to pollen: Secondary | ICD-10-CM | POA: Diagnosis not present

## 2021-07-20 DIAGNOSIS — N529 Male erectile dysfunction, unspecified: Secondary | ICD-10-CM | POA: Diagnosis not present

## 2021-07-20 DIAGNOSIS — K2100 Gastro-esophageal reflux disease with esophagitis, without bleeding: Secondary | ICD-10-CM | POA: Diagnosis not present

## 2021-07-20 DIAGNOSIS — I25119 Atherosclerotic heart disease of native coronary artery with unspecified angina pectoris: Secondary | ICD-10-CM | POA: Diagnosis not present

## 2021-07-20 DIAGNOSIS — I10 Essential (primary) hypertension: Secondary | ICD-10-CM | POA: Diagnosis not present

## 2021-07-20 DIAGNOSIS — E7849 Other hyperlipidemia: Secondary | ICD-10-CM | POA: Diagnosis not present

## 2021-07-30 ENCOUNTER — Other Ambulatory Visit: Payer: Self-pay | Admitting: Cardiovascular Disease

## 2021-07-30 ENCOUNTER — Encounter: Payer: Self-pay | Admitting: Internal Medicine

## 2021-08-25 ENCOUNTER — Other Ambulatory Visit: Payer: Self-pay | Admitting: Interventional Cardiology

## 2021-08-25 ENCOUNTER — Other Ambulatory Visit: Payer: Self-pay | Admitting: Internal Medicine

## 2021-09-09 DIAGNOSIS — M0609 Rheumatoid arthritis without rheumatoid factor, multiple sites: Secondary | ICD-10-CM | POA: Diagnosis not present

## 2021-10-20 ENCOUNTER — Other Ambulatory Visit: Payer: Self-pay

## 2021-10-20 DIAGNOSIS — Z23 Encounter for immunization: Secondary | ICD-10-CM | POA: Diagnosis not present

## 2021-10-20 MED ORDER — AMLODIPINE BESYLATE 5 MG PO TABS
5.0000 mg | ORAL_TABLET | Freq: Every day | ORAL | 2 refills | Status: DC
Start: 2021-10-20 — End: 2022-08-18

## 2021-10-21 DIAGNOSIS — G4733 Obstructive sleep apnea (adult) (pediatric): Secondary | ICD-10-CM | POA: Diagnosis not present

## 2021-11-10 ENCOUNTER — Other Ambulatory Visit: Payer: Self-pay

## 2021-11-10 MED ORDER — EZETIMIBE 10 MG PO TABS: 10.0000 mg | ORAL_TABLET | Freq: Every day | ORAL | 1 refills | Status: DC

## 2021-11-11 ENCOUNTER — Encounter: Payer: Self-pay | Admitting: Pulmonary Disease

## 2021-11-11 ENCOUNTER — Ambulatory Visit: Payer: PPO | Admitting: Pulmonary Disease

## 2021-11-11 DIAGNOSIS — J849 Interstitial pulmonary disease, unspecified: Secondary | ICD-10-CM

## 2021-11-11 DIAGNOSIS — G4733 Obstructive sleep apnea (adult) (pediatric): Secondary | ICD-10-CM

## 2021-11-11 NOTE — Assessment & Plan Note (Signed)
Appears stable, reviewed HRCT and PFTs. He continues on CellCept and Medrol-this regimen has worked well for him -Being managed by rheumatology

## 2021-11-11 NOTE — Patient Instructions (Signed)
CPAP is working well  Scarring is minimal

## 2021-11-11 NOTE — Progress Notes (Signed)
   Subjective:    Patient ID: Richard Simpson, male    DOB: [birth year], 51 y.o.   MRN: [id number]  HPI  51 yo ex-smoker for follow-up of RA- ILD and OSA.   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 Breathing is stable.  He can walk 30 minutes on a treadmill but not much more than that.  Chest discomfort is subsided. He is going to have his first grandchild in December.   He had scalp lesions, followed by dermatology, applied chemo cream, he shows me pictures. CPAP is working well, "wife loves it" no snoring, no excessive somnolence  Significant tests/ events reviewed  PFTs 04/2021 showed FVC of 69%, FEV1 76%, ratio 86, TLC 79%, DLCO 87% PFTs 06/21/19- FEV1 3.91 (88%), ratio 88, DLCO 32.62 ( 99%) - stable    HRCT 05/2021 stable ILD  HRCT 06/18/19 minimal ground glass opacity significantly improved from 2018.   PFTs 10/2017 FVC 83%, DLCO 98%   HRCT 12/2016 >> worsening multifocal areas of ground-glass attenuation, most confluent in the left upper lobe, but with progressive involvement in the lower lobes of the lungs bilaterally as well.   06/25/2014 CT angio chest >> Patchy regions of ground-glass attenuation opacity 121m& 6cm in the medial aspect of the left upper lobe with associated volume loss 09/2014 CT chest >Decrease in size of ground-glass attenuating opacity within the perihilar left upper lobe compatible with a benign, likely infectious or inflammatory process.   Split NPSG  12/2016 -wt 275 pounds -  AHI  60/hour with lowest desaturation 75%.  This was corrected by CPAP 8 cm with nasal pillows   PFTs 12/2013 >>no obs,FVC 77%, nml DLCO -  Mild restriction ?effect of obesity    PFT 12/2016  moderate restriction . FEV1 70%, ratio 86, FVC 63%, DLCO 74%.  Review of Systems  neg for any significant sore throat, dysphagia, itching, sneezing, nasal congestion or excess/ purulent secretions, fever, chills, sweats, unintended wt loss, pleuritic or exertional cp, hempoptysis, orthopnea pnd or change in chronic leg swelling. Also denies presyncope, palpitations, heartburn, abdominal pain, nausea, vomiting, diarrhea or change in bowel or urinary habits, dysuria,hematuria, rash, arthralgias, visual complaints, headache, numbness weakness or ataxia.     Objective:   Physical Exam  Gen. Pleasant, obese, in no distress ENT - no lesions, no post nasal drip Neck: No JVD, no thyromegaly, no carotid bruits Lungs: no use of accessory muscles, no dullness to percussion, decreased without rales or rhonchi  Cardiovascular: Rhythm regular, heart sounds  normal, no murmurs or gallops, no peripheral edema Musculoskeletal: No deformities, no cyanosis or clubbing , no tremors Skin - scalp lesions improved (he shows me pics)      Assessment & Plan:

## 2021-11-11 NOTE — Assessment & Plan Note (Signed)
CPAP download was reviewed which shows excellent control of events on 9 cm.  He is very compliant and CPAP is certainly helped improve his daytime somnolence and fatigue  Weight loss encouraged, compliance with goal of at least 4-6 hrs every night is the expectation. Advised against medications with sedative side effects Cautioned against driving when sleepy - understanding that sleepiness will vary on a day to day basis

## 2021-11-17 DIAGNOSIS — Z6836 Body mass index (BMI) 36.0-36.9, adult: Secondary | ICD-10-CM | POA: Diagnosis not present

## 2021-11-17 DIAGNOSIS — M5440 Lumbago with sciatica, unspecified side: Secondary | ICD-10-CM | POA: Diagnosis not present

## 2021-11-17 DIAGNOSIS — E669 Obesity, unspecified: Secondary | ICD-10-CM | POA: Diagnosis not present

## 2021-11-17 DIAGNOSIS — I2510 Atherosclerotic heart disease of native coronary artery without angina pectoris: Secondary | ICD-10-CM | POA: Diagnosis not present

## 2021-11-17 DIAGNOSIS — M0609 Rheumatoid arthritis without rheumatoid factor, multiple sites: Secondary | ICD-10-CM | POA: Diagnosis not present

## 2021-11-17 DIAGNOSIS — J849 Interstitial pulmonary disease, unspecified: Secondary | ICD-10-CM | POA: Diagnosis not present

## 2021-11-25 ENCOUNTER — Encounter: Payer: Self-pay | Admitting: *Deleted

## 2021-12-08 DIAGNOSIS — J0100 Acute maxillary sinusitis, unspecified: Secondary | ICD-10-CM | POA: Diagnosis not present

## 2021-12-15 MED ORDER — LOSARTAN POTASSIUM 25 MG PO TABS: 12.5000 mg | ORAL_TABLET | Freq: Every day | ORAL | 3 refills | Status: DC

## 2021-12-16 DIAGNOSIS — L570 Actinic keratosis: Secondary | ICD-10-CM | POA: Diagnosis not present

## 2021-12-22 ENCOUNTER — Other Ambulatory Visit: Payer: Self-pay | Admitting: Cardiovascular Disease

## 2022-01-14 DIAGNOSIS — Z0001 Encounter for general adult medical examination with abnormal findings: Secondary | ICD-10-CM | POA: Diagnosis not present

## 2022-01-14 DIAGNOSIS — K2100 Gastro-esophageal reflux disease with esophagitis, without bleeding: Secondary | ICD-10-CM | POA: Diagnosis not present

## 2022-01-14 DIAGNOSIS — E7849 Other hyperlipidemia: Secondary | ICD-10-CM | POA: Diagnosis not present

## 2022-01-14 DIAGNOSIS — Z1329 Encounter for screening for other suspected endocrine disorder: Secondary | ICD-10-CM | POA: Diagnosis not present

## 2022-01-14 DIAGNOSIS — I10 Essential (primary) hypertension: Secondary | ICD-10-CM | POA: Diagnosis not present

## 2022-01-14 DIAGNOSIS — M05741 Rheumatoid arthritis with rheumatoid factor of right hand without organ or systems involvement: Secondary | ICD-10-CM | POA: Diagnosis not present

## 2022-01-14 DIAGNOSIS — R7301 Impaired fasting glucose: Secondary | ICD-10-CM | POA: Diagnosis not present

## 2022-01-20 DIAGNOSIS — Z1389 Encounter for screening for other disorder: Secondary | ICD-10-CM | POA: Diagnosis not present

## 2022-01-20 DIAGNOSIS — M05741 Rheumatoid arthritis with rheumatoid factor of right hand without organ or systems involvement: Secondary | ICD-10-CM | POA: Diagnosis not present

## 2022-01-20 DIAGNOSIS — I10 Essential (primary) hypertension: Secondary | ICD-10-CM | POA: Diagnosis not present

## 2022-01-20 DIAGNOSIS — I25119 Atherosclerotic heart disease of native coronary artery with unspecified angina pectoris: Secondary | ICD-10-CM | POA: Diagnosis not present

## 2022-01-20 DIAGNOSIS — N529 Male erectile dysfunction, unspecified: Secondary | ICD-10-CM | POA: Diagnosis not present

## 2022-01-20 DIAGNOSIS — E7849 Other hyperlipidemia: Secondary | ICD-10-CM | POA: Diagnosis not present

## 2022-01-20 DIAGNOSIS — J301 Allergic rhinitis due to pollen: Secondary | ICD-10-CM | POA: Diagnosis not present

## 2022-01-20 DIAGNOSIS — R7301 Impaired fasting glucose: Secondary | ICD-10-CM | POA: Diagnosis not present

## 2022-01-20 DIAGNOSIS — Z0001 Encounter for general adult medical examination with abnormal findings: Secondary | ICD-10-CM | POA: Diagnosis not present

## 2022-01-20 DIAGNOSIS — K2100 Gastro-esophageal reflux disease with esophagitis, without bleeding: Secondary | ICD-10-CM | POA: Diagnosis not present

## 2022-01-20 DIAGNOSIS — Z1331 Encounter for screening for depression: Secondary | ICD-10-CM | POA: Diagnosis not present

## 2022-01-22 ENCOUNTER — Other Ambulatory Visit: Payer: Self-pay | Admitting: Physician Assistant

## 2022-04-15 ENCOUNTER — Other Ambulatory Visit: Payer: Self-pay | Admitting: Cardiovascular Disease

## 2022-04-16 DIAGNOSIS — M0609 Rheumatoid arthritis without rheumatoid factor, multiple sites: Secondary | ICD-10-CM | POA: Diagnosis not present

## 2022-04-23 DIAGNOSIS — G4733 Obstructive sleep apnea (adult) (pediatric): Secondary | ICD-10-CM | POA: Diagnosis not present

## 2022-04-27 DIAGNOSIS — G4733 Obstructive sleep apnea (adult) (pediatric): Secondary | ICD-10-CM | POA: Diagnosis not present

## 2022-05-10 ENCOUNTER — Other Ambulatory Visit: Payer: Self-pay | Admitting: Cardiovascular Disease

## 2022-05-20 DIAGNOSIS — Z85828 Personal history of other malignant neoplasm of skin: Secondary | ICD-10-CM | POA: Diagnosis not present

## 2022-05-20 DIAGNOSIS — L570 Actinic keratosis: Secondary | ICD-10-CM | POA: Diagnosis not present

## 2022-05-20 DIAGNOSIS — L821 Other seborrheic keratosis: Secondary | ICD-10-CM | POA: Diagnosis not present

## 2022-05-20 DIAGNOSIS — D485 Neoplasm of uncertain behavior of skin: Secondary | ICD-10-CM | POA: Diagnosis not present

## 2022-05-20 DIAGNOSIS — L905 Scar conditions and fibrosis of skin: Secondary | ICD-10-CM | POA: Diagnosis not present

## 2022-05-20 DIAGNOSIS — L814 Other melanin hyperpigmentation: Secondary | ICD-10-CM | POA: Diagnosis not present

## 2022-05-25 DIAGNOSIS — J849 Interstitial pulmonary disease, unspecified: Secondary | ICD-10-CM | POA: Diagnosis not present

## 2022-05-25 DIAGNOSIS — M5440 Lumbago with sciatica, unspecified side: Secondary | ICD-10-CM | POA: Diagnosis not present

## 2022-05-25 DIAGNOSIS — Z6835 Body mass index (BMI) 35.0-35.9, adult: Secondary | ICD-10-CM | POA: Diagnosis not present

## 2022-05-25 DIAGNOSIS — E669 Obesity, unspecified: Secondary | ICD-10-CM | POA: Diagnosis not present

## 2022-05-25 DIAGNOSIS — M0609 Rheumatoid arthritis without rheumatoid factor, multiple sites: Secondary | ICD-10-CM | POA: Diagnosis not present

## 2022-05-25 DIAGNOSIS — I2510 Atherosclerotic heart disease of native coronary artery without angina pectoris: Secondary | ICD-10-CM | POA: Diagnosis not present

## 2022-06-15 ENCOUNTER — Other Ambulatory Visit: Payer: Self-pay | Admitting: Cardiovascular Disease

## 2022-07-12 ENCOUNTER — Other Ambulatory Visit: Payer: Self-pay | Admitting: Cardiovascular Disease

## 2022-07-16 DIAGNOSIS — E782 Mixed hyperlipidemia: Secondary | ICD-10-CM | POA: Diagnosis not present

## 2022-07-16 DIAGNOSIS — R7301 Impaired fasting glucose: Secondary | ICD-10-CM | POA: Diagnosis not present

## 2022-07-16 DIAGNOSIS — E7849 Other hyperlipidemia: Secondary | ICD-10-CM | POA: Diagnosis not present

## 2022-07-22 DIAGNOSIS — Z6835 Body mass index (BMI) 35.0-35.9, adult: Secondary | ICD-10-CM | POA: Diagnosis not present

## 2022-07-22 DIAGNOSIS — J301 Allergic rhinitis due to pollen: Secondary | ICD-10-CM | POA: Diagnosis not present

## 2022-07-22 DIAGNOSIS — N529 Male erectile dysfunction, unspecified: Secondary | ICD-10-CM | POA: Diagnosis not present

## 2022-07-22 DIAGNOSIS — R7301 Impaired fasting glucose: Secondary | ICD-10-CM | POA: Diagnosis not present

## 2022-07-22 DIAGNOSIS — K2100 Gastro-esophageal reflux disease with esophagitis, without bleeding: Secondary | ICD-10-CM | POA: Diagnosis not present

## 2022-07-22 DIAGNOSIS — I25119 Atherosclerotic heart disease of native coronary artery with unspecified angina pectoris: Secondary | ICD-10-CM | POA: Diagnosis not present

## 2022-07-22 DIAGNOSIS — E7849 Other hyperlipidemia: Secondary | ICD-10-CM | POA: Diagnosis not present

## 2022-07-22 DIAGNOSIS — I10 Essential (primary) hypertension: Secondary | ICD-10-CM | POA: Diagnosis not present

## 2022-07-22 DIAGNOSIS — Z23 Encounter for immunization: Secondary | ICD-10-CM | POA: Diagnosis not present

## 2022-08-02 DIAGNOSIS — D23122 Other benign neoplasm of skin of left lower eyelid, including canthus: Secondary | ICD-10-CM | POA: Diagnosis not present

## 2022-08-02 DIAGNOSIS — H1045 Other chronic allergic conjunctivitis: Secondary | ICD-10-CM | POA: Diagnosis not present

## 2022-08-02 DIAGNOSIS — H04123 Dry eye syndrome of bilateral lacrimal glands: Secondary | ICD-10-CM | POA: Diagnosis not present

## 2022-08-09 DIAGNOSIS — Z6834 Body mass index (BMI) 34.0-34.9, adult: Secondary | ICD-10-CM | POA: Diagnosis not present

## 2022-08-09 DIAGNOSIS — L03213 Periorbital cellulitis: Secondary | ICD-10-CM | POA: Diagnosis not present

## 2022-08-09 DIAGNOSIS — R030 Elevated blood-pressure reading, without diagnosis of hypertension: Secondary | ICD-10-CM | POA: Diagnosis not present

## 2022-08-18 ENCOUNTER — Other Ambulatory Visit: Payer: Self-pay | Admitting: Cardiovascular Disease

## 2022-08-26 ENCOUNTER — Encounter (HOSPITAL_BASED_OUTPATIENT_CLINIC_OR_DEPARTMENT_OTHER): Payer: Self-pay | Admitting: Pulmonary Disease

## 2022-08-26 ENCOUNTER — Ambulatory Visit (HOSPITAL_BASED_OUTPATIENT_CLINIC_OR_DEPARTMENT_OTHER): Payer: PPO | Admitting: Pulmonary Disease

## 2022-08-26 ENCOUNTER — Ambulatory Visit (HOSPITAL_BASED_OUTPATIENT_CLINIC_OR_DEPARTMENT_OTHER): Payer: PPO

## 2022-08-26 VITALS — BP 110/80 | HR 55 | Resp 14 | Ht 73.0 in | Wt 258.2 lb

## 2022-08-26 DIAGNOSIS — J849 Interstitial pulmonary disease, unspecified: Secondary | ICD-10-CM

## 2022-08-26 DIAGNOSIS — I10 Essential (primary) hypertension: Secondary | ICD-10-CM | POA: Diagnosis not present

## 2022-08-26 DIAGNOSIS — G4733 Obstructive sleep apnea (adult) (pediatric): Secondary | ICD-10-CM | POA: Diagnosis not present

## 2022-08-26 DIAGNOSIS — R0989 Other specified symptoms and signs involving the circulatory and respiratory systems: Secondary | ICD-10-CM | POA: Diagnosis not present

## 2022-08-26 NOTE — Assessment & Plan Note (Signed)
CPAP download was reviewed which shows excellent control of events on 9 cm.  He is very compliant and CPAP is only helped improve his daytime somnolence and fatigue. CPAP supplies will be renewed for a year Weight loss encouraged, compliance with goal of at least 4-6 hrs every night is the expectation. Advised against medications with sedative side effects Cautioned against driving when sleepy - understanding that sleepiness will vary on a day to day basis

## 2022-08-26 NOTE — Patient Instructions (Signed)
-   CXR   - PFTs

## 2022-08-26 NOTE — Progress Notes (Signed)
   Subjective:    Patient ID: Richard Simpson, male    DOB: 12-31-1970, 52 y.o.   MRN: [id number]  HPI  52 yo  ex-smoker for follow-up of RA- ILD and OSA.   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 He remains on CellCept and low-dose Medrol 4 mg and arthritis is mostly in remission.  He has occasional flareup especially if he works a lot. Heat is making his breathing worse.  He has been using his albuterol inhaler more in the summer. Wife wanted to check out his breathing. No skin rash, no cough or wheezing  He is compliant with his CPAP machine and denies any problems with mask or pressure   Significant tests/ events reviewed   PFTs 04/2021 showed FVC of 69%, FEV1 76%, ratio 86, TLC 79%, DLCO 87% PFTs 06/21/19- FEV1 3.91 (88%), ratio 88, DLCO 32.62 ( 99%) - stable      HRCT 05/2021 stable ILD  HRCT 06/18/19 minimal ground glass opacity significantly improved from 2018.   PFTs 10/2017 FVC 83%, DLCO 98%   HRCT 12/2016 >> worsening multifocal areas of ground-glass attenuation, most confluent in the left upper lobe, but with progressive involvement in the lower lobes of the lungs bilaterally as well.   06/25/2014 CT angio chest >> Patchy regions of ground-glass attenuation opacity 15mm & 6cm in the medial aspect of the left upper lobe with associated volume loss 09/2014 CT chest >Decrease in size of ground-glass attenuating opacity within the perihilar left upper lobe compatible with a benign, likely infectious or inflammatory  process.   Split NPSG  12/2016 -wt 275 pounds -  AHI 60/hour with lowest desaturation 75%.  This was corrected by CPAP 8 cm with nasal pillows   PFTs 12/2013 >>no obs,FVC 77%, nml DLCO -  Mild restriction ?effect of obesity    PFT 12/2016  moderate restriction . FEV1 70%, ratio 86, FVC 63%, DLCO 74%.    Review of Systems neg for any significant sore throat, dysphagia, itching, sneezing, nasal congestion or excess/ purulent secretions, fever, chills, sweats, unintended wt loss, pleuritic or exertional cp, hempoptysis, orthopnea pnd or change in chronic leg swelling. Also denies presyncope, palpitations, heartburn, abdominal pain, nausea, vomiting, diarrhea or change in bowel or urinary habits, dysuria,hematuria, rash, arthralgias, visual complaints, headache, numbness weakness or ataxia.     Objective:   Physical Exam  Gen. Pleasant, obese, in no distress ENT - no lesions, no post nasal drip Neck: No JVD, no thyromegaly, no carotid bruits Lungs: no use of accessory muscles, no dullness to percussion, decreased without rales or rhonchi  Cardiovascular: Rhythm regular, heart sounds  normal, no murmurs or gallops, no peripheral edema Musculoskeletal: No deformities, no cyanosis or clubbing , no tremors       Assessment & Plan:

## 2022-08-26 NOTE — Assessment & Plan Note (Addendum)
ILD appears to be clinically stable although his worsening breathing may simply be related to the hot weather.  He is using his albuterol MDI more than usual. Will reassess with PFTs. We will also assess with chest x-ray

## 2022-09-07 ENCOUNTER — Other Ambulatory Visit: Payer: Self-pay | Admitting: Cardiovascular Disease

## 2022-09-10 DIAGNOSIS — B029 Zoster without complications: Secondary | ICD-10-CM | POA: Diagnosis not present

## 2022-09-10 DIAGNOSIS — Z6834 Body mass index (BMI) 34.0-34.9, adult: Secondary | ICD-10-CM | POA: Diagnosis not present

## 2022-09-10 DIAGNOSIS — R030 Elevated blood-pressure reading, without diagnosis of hypertension: Secondary | ICD-10-CM | POA: Diagnosis not present

## 2022-09-13 ENCOUNTER — Encounter: Payer: Self-pay | Admitting: Cardiovascular Disease

## 2022-09-13 ENCOUNTER — Other Ambulatory Visit: Payer: Self-pay | Admitting: Cardiovascular Disease

## 2022-09-13 MED ORDER — METOPROLOL TARTRATE 25 MG PO TABS: 37.0000 mg | ORAL_TABLET | Freq: Two times a day (BID) | ORAL | 0 refills | Status: DC

## 2022-09-13 NOTE — Telephone Encounter (Signed)
Patient is aware he has a 3 month supply . RN  schedule appointment. keep annual appt 11/16/22.  Patient verbalized understanding

## 2022-09-17 DIAGNOSIS — Z6834 Body mass index (BMI) 34.0-34.9, adult: Secondary | ICD-10-CM | POA: Diagnosis not present

## 2022-09-17 DIAGNOSIS — R030 Elevated blood-pressure reading, without diagnosis of hypertension: Secondary | ICD-10-CM | POA: Diagnosis not present

## 2022-09-17 DIAGNOSIS — B0229 Other postherpetic nervous system involvement: Secondary | ICD-10-CM | POA: Diagnosis not present

## 2022-09-17 DIAGNOSIS — B029 Zoster without complications: Secondary | ICD-10-CM | POA: Diagnosis not present

## 2022-09-18 ENCOUNTER — Other Ambulatory Visit: Payer: Self-pay | Admitting: Cardiovascular Disease

## 2022-09-18 ENCOUNTER — Other Ambulatory Visit: Payer: Self-pay | Admitting: Internal Medicine

## 2022-09-24 ENCOUNTER — Other Ambulatory Visit: Payer: Self-pay | Admitting: Interventional Cardiology

## 2022-10-05 DIAGNOSIS — G4733 Obstructive sleep apnea (adult) (pediatric): Secondary | ICD-10-CM | POA: Diagnosis not present

## 2022-10-13 DIAGNOSIS — Z6833 Body mass index (BMI) 33.0-33.9, adult: Secondary | ICD-10-CM | POA: Diagnosis not present

## 2022-10-13 DIAGNOSIS — K529 Noninfective gastroenteritis and colitis, unspecified: Secondary | ICD-10-CM | POA: Diagnosis not present

## 2022-10-18 ENCOUNTER — Other Ambulatory Visit: Payer: Self-pay

## 2022-10-18 DIAGNOSIS — M0609 Rheumatoid arthritis without rheumatoid factor, multiple sites: Secondary | ICD-10-CM | POA: Diagnosis not present

## 2022-10-18 MED ORDER — FUROSEMIDE 20 MG PO TABS: 20.0000 mg | ORAL_TABLET | Freq: Every day | ORAL | 0 refills | Status: DC

## 2022-11-01 ENCOUNTER — Telehealth: Payer: Self-pay | Admitting: Internal Medicine

## 2022-11-01 ENCOUNTER — Other Ambulatory Visit: Payer: Self-pay

## 2022-11-01 DIAGNOSIS — R197 Diarrhea, unspecified: Secondary | ICD-10-CM

## 2022-11-01 DIAGNOSIS — M0609 Rheumatoid arthritis without rheumatoid factor, multiple sites: Secondary | ICD-10-CM | POA: Diagnosis not present

## 2022-11-01 NOTE — Telephone Encounter (Signed)
Stool studies were ordered and pt was made aware.

## 2022-11-01 NOTE — Telephone Encounter (Signed)
Patient called me on my cell phone last night.  Nearly a month worth of nonbloody diarrhea no fever abdominal cramps no vomiting.  Saw Dr. Reuel Boom.  Prescribed Cipro and Flagyl.  Mild improvement but symptoms recurred.  Took 5 days worth of antibiotics.  On a probiotic.  We need to send a stool sample for GIP and C. difficile today.  I note that he took doxycycline back in July.  Thanks.

## 2022-11-02 LAB — GI PROFILE, STOOL, PCR

## 2022-11-05 LAB — C DIFFICILE, CYTOTOXIN B

## 2022-11-05 LAB — C DIFFICILE TOXINS A+B W/RFLX: C difficile Toxins A+B, EIA: NEGATIVE

## 2022-11-16 ENCOUNTER — Encounter: Payer: Self-pay | Admitting: Cardiovascular Disease

## 2022-11-16 ENCOUNTER — Ambulatory Visit: Payer: PPO | Attending: Cardiovascular Disease | Admitting: Cardiovascular Disease

## 2022-11-16 DIAGNOSIS — I2510 Atherosclerotic heart disease of native coronary artery without angina pectoris: Secondary | ICD-10-CM | POA: Diagnosis not present

## 2022-11-16 DIAGNOSIS — J849 Interstitial pulmonary disease, unspecified: Secondary | ICD-10-CM

## 2022-11-16 DIAGNOSIS — G4733 Obstructive sleep apnea (adult) (pediatric): Secondary | ICD-10-CM

## 2022-11-16 DIAGNOSIS — E661 Drug-induced obesity: Secondary | ICD-10-CM | POA: Diagnosis not present

## 2022-11-16 DIAGNOSIS — Z6833 Body mass index (BMI) 33.0-33.9, adult: Secondary | ICD-10-CM | POA: Diagnosis not present

## 2022-11-16 DIAGNOSIS — I25119 Atherosclerotic heart disease of native coronary artery with unspecified angina pectoris: Secondary | ICD-10-CM

## 2022-11-16 DIAGNOSIS — I10 Essential (primary) hypertension: Secondary | ICD-10-CM

## 2022-11-16 DIAGNOSIS — M05749 Rheumatoid arthritis with rheumatoid factor of unspecified hand without organ or systems involvement: Secondary | ICD-10-CM

## 2022-11-16 DIAGNOSIS — Z9861 Coronary angioplasty status: Secondary | ICD-10-CM

## 2022-11-16 DIAGNOSIS — E66811 Obesity, class 1: Secondary | ICD-10-CM

## 2022-11-16 MED ORDER — LOSARTAN POTASSIUM 25 MG PO TABS: 12.5000 mg | ORAL_TABLET | Freq: Every day | ORAL | 3 refills | Status: DC

## 2022-11-16 MED ORDER — METOPROLOL TARTRATE 25 MG PO TABS: 37.0000 mg | ORAL_TABLET | Freq: Two times a day (BID) | ORAL | 3 refills | Status: DC

## 2022-11-16 MED ORDER — FUROSEMIDE 20 MG PO TABS: 20.0000 mg | ORAL_TABLET | Freq: Every day | ORAL | 3 refills | Status: DC

## 2022-11-16 MED ORDER — CLOPIDOGREL BISULFATE 75 MG PO TABS: 75.0000 mg | ORAL_TABLET | Freq: Every day | ORAL | 3 refills | Status: DC

## 2022-11-16 MED ORDER — ATORVASTATIN CALCIUM 80 MG PO TABS: 80.0000 mg | ORAL_TABLET | Freq: Every day | ORAL | 3 refills | Status: DC

## 2022-11-16 MED ORDER — EZETIMIBE 10 MG PO TABS: 10.0000 mg | ORAL_TABLET | Freq: Every day | ORAL | 3 refills | Status: DC

## 2022-11-16 MED ORDER — RANOLAZINE ER 500 MG PO TB12: 500.0000 mg | ORAL_TABLET | Freq: Two times a day (BID) | ORAL | 3 refills | Status: DC

## 2022-11-16 NOTE — Progress Notes (Signed)
Cardiology Office Note    Date:  11/21/2022   ID:  ROWLEY GROENEWEG, DOB 1970-11-28, MRN [id number]  PCP:  [facility]  Cardiologist:  Nicki Guadalajara, MD   17 month F/U  History of Present Illness:  Richard Simpson is a 52 y.o. male who presents for a 17 month follow-up evaluation.  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 He developed severe chest pain the evening of July 04, 2014 leading to his Richard Simpson hospitalization on the morning of June 10.  He was seen in consultation by Dr. Wyline Mood and transferred for urgent cardiac catheterization which was done by me. This revealed a subtotal 99+ percent ostial LAD stenosis followed by proximal 70% and 50% stenoses.  Initial ejection fraction was 45% with anterolateral hypocontractility.  He underwent successful intervention with insertion of Synergy DES 3.520 mm stent placed from the LAD ostium to cover all 3 lesions.  Subsequently, he noticed marked benefit in his previous exertional shortness of breath symptomatology.  He was seen  In follow-up by [name], NP and due to and ACE-induced cough was started on losartan in place of lisinopril. He has a history of arthritic disease and in the past had been treated for probable psoriatic arthritis but he is now felt to have rheumatoid arthritis.   An echo Doppler study on 08/09/2014 which mild focal basal hypertrophy of the septum.  There are no wall motion abnormalities.  Ejection fraction was 60-65%.  There was grade 1 diastolic dysfunction.  The mitral valve was mildly calcified at its annulus and there was trivial aortic insufficiency.  He participated in cardiac rehabilitation last year.   He developed recurrent episodes of chest tightness and underwent repeat cardiac catheterization on 03/24/2015 by Dr. Herbie Baltimore which showed a widely patent stent at the ostium and proximal LAD  segment.  There was mild 35% narrowing after the takeoff of the first diagonal vessel.  His circumflex and RCA were considered to be normal.  His LVEDP was mildly increased.  It was felt that there was no new evidence for a new CAD lesion to explain his symptoms.  It was felt that possibly his symptoms were related to microvascular disease versus diastolic dysfunction.  Subsequently, he was started on Lasix 20 mg.  He also has been taking amlodipine 5 mg, losartan 25 mg, metoprolol tartrate 25 mg twice a day.  He continues to be on aspirin 81 mg and Brilinta 90 mg twice a day.  His wife was very concerned about this 35% LAD stenosis.    When I saw him in March 2017, I titrated his losartan from 25 mg to 37.5 mg and recommended addition of Zetia to his 80 mg atorvastatin in attempt to induce further plaque regression.  He underwent an echo Doppler study on 04/28/2015 which showed an ejection fraction at 60-65%.  He did not have any regional wall motion abnormalities.  He had mild left atrial dilatation.  He was felt to have normal diastolic function.  On subsequent evaluation he continued, he has continued to experience episodes of chest discomfort which he believes occurs with activity.  Due to potential microvascular component to symptomatology I added nitrate therapy.  I referred him for a cardiopulmonary met test which did not reveal significant cardiac or pulmonary limitation.    He was seen by Theodore Demark, PAC, and because of increasing recurrent chest  pain.  He underwent repeat cardiac catheterization by me 11/20/2015.  This did not demonstrate any residual CAD and had a widely patent LAD stent extending from the ostium ending prior to the takeoff of the first diagonal vessel.  There was mild 15% narrowing in the LAD after diagonal vessel which was improved from previous evaluation.  His circumflex, intermediate, and dominant RCA vessels were normal.    He was seen in June 2018 by Richard Simpson and  was seen by [name] on 12/28/2016.  His isosorbide was increased to 30 mg and he has noticed improvement and has not taken subungual nitroglycerin.  He had developed shortness of breath with hunting.  She referred him for an echo Doppler study on 01/06/2017, which showed an EF 50-55% with anteroapical, septal hypokinesis and grade 1 diastolic dysfunction.     I had not seen him since February 2017 and saw him on January 13, 2017.  He had undergone pulmonary evaluation and was found to have restrictive physiology on PFTs.  He has been followed by Dr. [name] and a high resolution CT scan showed worsening of multifocal areas of groundglass attenuation most confluent in the left upper lobe but with progressive involvement in the lower lobes of the lungs bilaterally as well.  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  He is now off Remicade and is on CellCept.   When I saw him in the office in April 2019 he was doing well and was closely followed by pulmonology for interstitial lung disease.  He also has rheumatoid arthritis.  He was doing well on Imdur, Norvasc, metoprolol and low-dose losartan. He continued to be on aspirin and Plavix. He notes significant improvement in his shortness of breath.  He has lost over 40 pounds.  He is breathing better.  He denies recurrent anginal symptoms.    He was later seen in October 2019 by [name], PA and remained stable and his last cardiology evaluation was with [name], PA in November 2020. Due to the COVID-19, he had gained some weight  However he denied any significant cardiac issue.  He continued to have some chest discomfort with more strenuous activities such as hunting, however no chest discomfort with regular daily activity.   It was recommended that he continue his current cardiac medications.   [name] was remaining stable from a cardiac standpoint however around  [place] of this year his whole family as well as his son's girlfriends family all came down with Covid.  He states he never tested positive but he had similar symptomatology to all his family members and actually had the worst symptoms.  His oxygen decreased slightly to 90.  He was advised hospitalization but declined.  He was treated with steroids and Z-Pak after chest x-ray showed changes and he was found to have some interstitial edema.  He did not receive remdemsivir.  He received Lasix 80 mg for several days with improvement in his shortness of breath.  Prior to his most likely Covid episode, he states he was only taking Lasix as needed for some shortness of breath.  Since his symptoms he has required daily Lasix of at least 40 or 60 mg ever since.  He continues to experience residual fatigue.  He has obstructive sleep apnea and is continued to use CPAP therapy.  He will be undergoing a follow-up high resolution chest CT for  his interstitial lung disease.  He denies any of his previous anginal symptoms.  He admits to weight gain and fatigability with exercise.  He has been trying to walk on a treadmill but often after 5 minutes he typically has to stop.  I saw him on 04/16/2021. Since his last telemedicine visit with me on February 23, 2019 he underwent an echo Doppler study on March 07, 2019 which showed an EF of 65 to 70%.  There was grade 1 diastolic dysfunction.  He was seen by Bailey Mech, NP on April 02, 2019 with complaints of almost daily chest pain with minimal exertion.  He had been evaluated in the emergency room.  In light of recurrent symptomatology he was scheduled for an outpatient cardiac catheterization which was done on April 06, 2019 by Dr. Swaziland and showed widely patent proximal LAD stent and proximal to mid 35% LAD stenosis.  Subsequently, he has felt relieved and has had improvement in his prior chest pain.  He had been increasing his Lasix for fluid retention and also is  undergoing evaluation for interstitial lung disease and is to have a high-resolution CT scan.   Since my last evaluation, he has been seen by pulmonary and is followed by Dr. [name].  He is felt to have interstitial lung disease.  He is not on Remicade.  A recent CT of his chest showed stable findings in his lungs which were favored to reflect very mild chronic postinfection or inflammatory scarring.  There was no imaging findings to suggest progressive interstitial lung disease.  He was noted to have aortic atherosclerosis in addition to left main and two-vessel calcification.  I last saw him on Jun 17, 2021 at which time he denied any recurrent chest pain.  He was remaining active and typically was walking 30 minutes on a treadmill at least 3 to 4 days/week.  He tolerates 30 minutes but if he exercises for 40 minutes he notes increased shortness of breath.  He continues to be followed [facility] who checks laboratory.  He continues to use CPAP and goes to bed between 11 PM and midnight and wakes up at 7 AM.  He is on amlodipine 5 mg, losartan 12.5 mg, metoprolol tartrate 37.5 mg twice a day and Ranexa 500 mg twice a day [place].  He is on CellCept for rheumatoid arthritis.    Since I last saw him, he has seen Dr. [name] in August 2024 and remained relatively stable on CellCept and low-dose Medrol.  He was continuing to use CPAP followed by Dr. [name].  Presently, Mr. Lennartz feels well.  He did have an episode of shingles in August.  At times he experiences rare chest fluttering.  He is sleeping well using CPAP.  His blood pressure has remained stable.  He continues to be on amlodipine 5 mg, losartan 25 mg, metoprolol tartrate 37.5 mg twice a day and Ranexa 500 mg twice a day.  He takes furosemide on a as needed basis.  He has continued to be on DAPT with aspirin/Plavix and denies any bleeding.  He continues to be on CellCept 2000 mg/day in addition to Medrol 4 mg.  He presents for  evaluation.  Past Medical History:  Diagnosis Date   Anxiety    CAD S/P percutaneous coronary angioplasty 10/2013   a. 10/2013 Cath: mod LAD dzs->nl FFR-> medically managed;  b. 06/2014 NSTEMI/PCi: LAD 99ost, 70p, 13m (entire area covered with a 3.5x20 Synergy DES), LCX nl, RCA 62m,  10d, EF 45-50%. c. 10/2015: cath showing patent LAD stent with 15% Prox LAD stenosis. EF 55-65%.    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 06/2014 EF 45-50% by LV gram b. Echo July 2016: EF 60-65%. No RWMA. Gr 1 DD    NSTEMI (non-ST elevated myocardial infarction) (HCC) 06/2014   Obesity (BMI 30-39.9) 10/29/2013   Primary snoring 10/29/2013   Rheumatoid arthritis (HCC) 09/23/2013   RLS (restless legs syndrome) 10/29/2013   Sleep apnea     Past Surgical History:  Procedure Laterality Date   BIOPSY  09/27/2018   Procedure: BIOPSY;  Surgeon: Corbin Ade, MD;  [facility]: [facility];  Service: Endoscopy;;   BIOPSY  04/13/2021   Procedure: BIOPSY;  Surgeon: Lemar Lofty., MD;  Location: [facility];  Service: Endoscopy;;   CARDIAC CATHETERIZATION N/A 07/05/2014   Procedure: Left Heart Cath and Coronary Angiography;  Surgeon: Lennette Bihari, MD;  Location: [facility];  Service: Cardiovascular;  NSTEMI --> Ostial LAd 99%, prox 80% & early mid 50% --> PCI   CARDIAC CATHETERIZATION  07/05/2014   Procedure: Coronary Stent Intervention;  Surgeon: Lennette Bihari, MD;  Location: [facility];  Service: Cardiovascular; Ostial-prox LAD tandem 99, 80 & 50% --> Synergy DES 3.5 x 20 (3.74)   CARDIAC CATHETERIZATION N/A 03/24/2015   Procedure: Left Heart Cath and Coronary Angiography;  Surgeon: Marykay Lex, MD;  [facility]: [facility] INVASIVE CV LAB;  Service: Cardiovascular;  Laterality: N/A;   CARDIAC CATHETERIZATION N/A 11/20/2015   Procedure: Left Heart Cath and Coronary Angiography;  Surgeon: Lennette Bihari, MD;  Location: [facility];  Service: Cardiovascular;  Laterality: N/A;   CHOLECYSTECTOMY     10 yrs ago-jenkins   COLONOSCOPY N/A 03/23/2013   Procedure: COLONOSCOPY;  Surgeon: Corbin Ade, MD;  [facility];  Service: Endoscopy;  Laterality: N/A;  2:15-moved to 1030 Staff notified pt   COLONOSCOPY WITH PROPOFOL N/A 06/10/2016   Procedure: COLONOSCOPY WITH PROPOFOL;  Surgeon: Corbin Ade, MD;  [facility];  Service: Endoscopy;  Laterality: N/A;  730    ESOPHAGOGASTRODUODENOSCOPY N/A 04/13/2021   Procedure: ESOPHAGOGASTRODUODENOSCOPY (EGD);  Surgeon: Meridee Score Netty Starring., MD;  Location: [facility];  Service: Endoscopy;  Laterality: N/A;   ESOPHAGOGASTRODUODENOSCOPY (EGD) WITH PROPOFOL N/A 09/27/2018   Procedure: ESOPHAGOGASTRODUODENOSCOPY (EGD) WITH PROPOFOL;  Surgeon: Corbin Ade, MD;  [facility];  Service: Endoscopy;  Laterality: N/A;  1:30pm   EUS N/A 04/13/2021   Procedure: UPPER ENDOSCOPIC ULTRASOUND (EUS) RADIAL;  Surgeon: Meridee Score Netty Starring., MD;  Location: [facility];  Service: Endoscopy;  Laterality: N/A;   FRACTIONAL FLOW RESERVE WIRE  11/23/2013   Procedure: FRACTIONAL FLOW RESERVE WIRE;  Surgeon: Corky Crafts, MD;  Location: [facility] CATH LAB;  Service: Cardiovascular;;   GALLBLADDER SURGERY     LEFT HEART CATHETERIZATION WITH CORONARY ANGIOGRAM N/A 11/23/2013   Procedure: LEFT HEART CATHETERIZATION WITH CORONARY ANGIOGRAM;  Surgeon: Corky Crafts, MD;  Location: [facility] CATH LAB;  Service: Cardiovascular;  Laterality: N/A;   MASS EXCISION  09/09/2010   Procedure: EXCISION MASS;  Surgeon: Fuller Canada, MD;  Location: [facility];  Service: Orthopedics;  Laterality: Left;  Excision Mass Left Long Finger   POLYPECTOMY   06/10/2016   Procedure: POLYPECTOMY;  Surgeon: Corbin Ade, MD;  [facility];  Service: Endoscopy;;  colon   RIGHT/LEFT HEART CATH AND CORONARY ANGIOGRAPHY N/A 04/06/2019   Procedure: RIGHT/LEFT HEART CATH AND CORONARY ANGIOGRAPHY;  Surgeon: Swaziland, Peter M, MD;  Location: [facility] INVASIVE CV LAB;  Service: Cardiovascular;  Laterality: N/A;   THROAT SURGERY     nodules removed from throat as child   TRANSTHORACIC ECHOCARDIOGRAM   July 2016:    EF 60-65%. No RWMA. Gr 1 DD    Current Medications: Outpatient Medications Prior to Visit  Medication Sig Dispense Refill   amLODipine (NORVASC) 5 MG tablet Take 1 tablet (5 mg total) by mouth daily. Please keep upcoming Oct appt for further refills. Thank you 90 tablet 0   aspirin EC 81 MG tablet Take 1 tablet (81 mg total) by mouth daily. 30 tablet 3   clonazePAM (KLONOPIN) 1 MG tablet Take 1 mg by mouth 3 (three) times daily as needed for anxiety. Take 1mg  tablet by mouth scheduled at bedtime, may take an additional dose if needed for anxiety     loratadine (CLARITIN) 10 MG tablet Take 10 mg by mouth daily.      methylPREDNISolone (MEDROL) 4 MG tablet Take 4 mg by mouth daily.     mycophenolate (CELLCEPT) 500 MG tablet Take 1,000 mg by mouth 2 (two) times daily. Takes 2 tablets in the morning and 2 tablets in the evening (2,000 mg total)     nitroGLYCERIN (NITROSTAT) 0.4 MG SL tablet DISSOLVE 1 TABLET UNDER TONGUE FOR CHEST TIGHTNESS AND SHORTNESS OF BREATH. MAY TAKE EVERY 5 MINUTES UP TO 3 DOSES. IF NO RELIEF AFTER 3 DOS 25 tablet 0   nortriptyline (PAMELOR) 10 MG capsule Take 1 capsule (10 mg total) by mouth at bedtime. 30 capsule 3   pantoprazole (PROTONIX) 40 MG tablet TAKE 1 TABLET BY MOUTH ONCE A DAY. 90 tablet 3   traMADol (ULTRAM) 50 MG tablet Take 50 mg by mouth every 6 (six) hours as needed for moderate pain.      atorvastatin (LIPITOR) 80 MG tablet TAKE (1) TABLET BY MOUTH ONCE DAILY. 90 tablet 3   clopidogrel (PLAVIX) 75 MG  tablet Take 1 tablet (75 mg total) by mouth daily. Please keep upcoming Oct appt for further refills. Thank you 90 tablet 0   ezetimibe (ZETIA) 10 MG tablet Take 1 tablet (10 mg total) by mouth daily. Please keep upcoming Oct appt for further refills. Thank you 90 tablet 0   furosemide (LASIX) 20 MG tablet Take 1 tablet (20 mg total) by mouth daily. 30 tablet 0   losartan (COZAAR) 25 MG tablet Take 0.5 tablets (12.5 mg total) by mouth daily. Take in the evening 45 tablet 3   metoprolol tartrate (LOPRESSOR) 25 MG tablet Take 1.5 tablets (37.5 mg total) by mouth 2 (two) times daily. Please keep your appt 11/16/22 270 tablet 0   ranolazine (RANEXA) 500 MG 12 hr tablet Take 1 tablet (500 mg total) by mouth 2 (two) times daily. 60 tablet 1   No facility-administered medications prior to visit.     Allergies:   Lisinopril and Prednisone   Social History   Socioeconomic History   Marital status: Married    Spouse name: Renee   Number of children: 2   Years of education: 12th  grade   Highest education level: Not on file  Occupational History   Occupation: Retired Civil engineer, contracting: [employer]  Tobacco Use   Smoking status: Former    Current packs/day: 0.00    Average packs/day: 0.3 packs/day for 15.0 years (3.8 ttl pk-yrs)    Types: Cigarettes    Start date: 09/03/1988    Quit date: 09/04/2003    Years since quitting: 19.2   Smokeless tobacco: Current    Types: Chew  Vaping Use   Vaping status: Never Used  Substance and Sexual Activity   Alcohol use: Yes    Alcohol/week: 2.0 - 3.0 standard drinks of alcohol    Types: 2 - 3 Cans of beer per week    Comment: rare   Drug use: No   Sexual activity: Yes  Other Topics Concern   Not on file  Social History Narrative   Patient lives at home with his wife Luster Landsberg).    Patient works full time Marketing executive..   Education high school.   Right handed.   Caffeine one cup of coffee daily and one soda.   Social  Determinants of Health   Financial Resource Strain: Not on file  Food Insecurity: Not on file  Transportation Needs: Not on file  Physical Activity: Not on file  Stress: Not on file  Social Connections: Not on file     Family History:  The patient's family history includes Arthritis in his paternal uncle; Cancer in his maternal grandfather; Diabetes in his maternal grandmother, mother, paternal grandfather, and other family members; Hypertension in his maternal grandmother and mother.   ROS General: Negative; No fevers, chills, or night sweats;  HEENT: Negative; No changes in vision or hearing, sinus congestion, difficulty swallowing Pulmonary: Mild interstitial lung disease; history of Covid infection,  Cardiovascular: See HPI GI: Negative; No nausea, vomiting, diarrhea, or abdominal pain GU: Negative; No dysuria, hematuria, or difficulty voiding Musculoskeletal: Rheumatoid arthritis Hematologic/Oncology: Negative; no easy bruising, bleeding Endocrine: Negative; no heat/cold intolerance; no diabetes Neuro: Negative; no changes in balance, headaches Skin: Negative; No rashes or skin lesions Psychiatric: Negative; No behavioral problems, depression Sleep: Positive for OSA on CPAP followed by  pulmonary Other comprehensive 14 point system review is negative.   PHYSICAL EXAM:   VS:  BP 110/68   Pulse (!) 52   Ht 6\' 1"  (1.854 m)   Wt 252 lb (114.3 kg)   SpO2 97%   BMI 33.25 kg/m     Repeat blood pressure by me 118/70  Wt Readings from Last 3 Encounters:  11/16/22 252 lb (114.3 kg)  08/26/22 258 lb 3.2 oz (117.1 kg)  11/11/21 259 lb 9.6 oz (117.8 kg)    General: Alert, oriented, no distress.  Skin: normal turgor, no rashes, warm and dry HEENT: Normocephalic, atraumatic. Pupils equal round and reactive to light; sclera anicteric; extraocular muscles intact;  Nose without nasal septal hypertrophy Mouth/Parynx benign; Mallinpatti scale 3 Neck: Thick neck; no JVD, no carotid  bruits; normal carotid upstroke Lungs: clear to ausculatation and percussion; no wheezing or rales Chest wall: without tenderness to palpitation Heart: PMI not displaced, RRR bradycardic at 52, s1 s2 normal, 1/6 systolic murmur, no diastolic murmur, no rubs, gallops, thrills, or heaves Abdomen: soft, nontender; no hepatosplenomehaly, BS+; abdominal aorta nontender and not dilated by palpation. Back: no CVA tenderness Pulses 2+ Musculoskeletal: full range of motion, normal strength, no joint deformities Extremities: no clubbing cyanosis or edema, Homan's sign negative  Neurologic: grossly nonfocal; Cranial nerves grossly wnl Psychologic: Normal mood and affect    Studies/Labs Reviewed:   EKG Interpretation Date/Time:  Tuesday November 16 2022 12:41:49 EDT Ventricular Rate:  52 PR Interval:  130 QRS Duration:  84 QT Interval:  432 QTC Calculation: 401 R Axis:   6  Text Interpretation: Sinus bradycardia When compared with ECG of 02-Apr-2019 00:05, PREVIOUS ECG IS PRESENT Confirmed by Nicki Guadalajara ([id number]) on 11/16/2022 1:00:13 PM    Jun 17, 2021 ECG (independently read by me): NSR at 67, no ectopy  April 17, 2019 ECG (independently read by me): NSR at 68; no ectopy.  Early transition normal intervals  December 15, 2018 ECG (independently read by me): Normal sinus rhythm at 66 bpm, no ectopy.  Normal intervals.  Recent Labs:    Latest Ref Rng & Units 12/12/2020   10:58 AM 04/06/2019    8:03 AM 04/06/2019    7:56 AM  BMP  Glucose 70 - 99 mg/dL 88     BUN 6 - 24 mg/dL 14     Creatinine 4.78 - 1.27 mg/dL 2.95     BUN/Creat Ratio 9 - 20 13     Sodium 134 - 144 mmol/L 140  142  142   Potassium 3.5 - 5.2 mmol/L 4.2  3.9  3.9   Chloride 96 - 106 mmol/L 101     CO2 20 - 29 mmol/L 24     Calcium 8.7 - 10.2 mg/dL 9.0           Latest Ref Rng & Units 12/22/2020    8:25 AM 12/17/2020   12:08 PM 12/12/2020   10:58 AM  Hepatic Function  Total Protein 6.0 - 8.5 g/dL 6.4  7.0  6.7    Albumin 4.0 - 5.0 g/dL 4.4  4.4  4.6   AST 0 - 40 IU/L 21  28  32   ALT 0 - 44 IU/L 37  48  53   Alk Phosphatase 44 - 121 IU/L 83  68  82   Total Bilirubin 0.0 - 1.2 mg/dL 1.1  1.9  1.2   Bilirubin, Direct 0.00 - 0.40 mg/dL 6.21  0.3         Latest Ref Rng & Units 12/12/2020   10:58 AM 04/06/2019    8:03 AM 04/06/2019    7:56 AM  CBC  WBC 3.4 - 10.8 x10E3/uL 7.6     Hemoglobin 13.0 - 17.7 g/dL 30.8  65.7  84.6   Hematocrit 37.5 - 51.0 % 48.3  43.0  42.0   Platelets 150 - 450 x10E3/uL 193      Lab Results  Component Value Date   MCV 93 12/12/2020   MCV 98.2 04/02/2019   MCV 92 03/07/2019   Lab Results  Component Value Date   TSH 1.890 03/07/2019   No results found for: "HGBA1C"   BNP    Component Value Date/Time   BNP 23.0 04/02/2019 0007    ProBNP    Component Value Date/Time   PROBNP 23 12/12/2020 1058   PROBNP 18.0 03/19/2015 1034     Lipid Panel     Component Value Date/Time   CHOL 102 11/17/2020 0819   TRIG 129 11/17/2020 0819   HDL 30 (L) 11/17/2020 0819   CHOLHDL 3.4 11/17/2020 0819   CHOLHDL 3.7 04/16/2015 0820   VLDL 14 04/16/2015 0820   LDLCALC 49 11/17/2020 0819   LABVLDL 23 11/17/2020 0819     RADIOLOGY: No results found.  Additional studies/ records that were reviewed today include:   Echo 03/07/2019 IMPRESSIONS   1. Left ventricular ejection fraction, by estimation, is 65 to 70%. The  left ventricle has normal function. The left ventrical is not well  visualized to evaluate regional wall motion. There is mildly increased  left ventricular hypertrophy. Left  ventricular diastolic parameters are consistent with Grade I diastolic  dysfunction (impaired relaxation.)   2. Right ventricular systolic function is normal. The right ventricular  size is normal. There is normal pulmonary artery systolic pressure.   3. The mitral valve is abnormal. trivial mitral valve regurgitation. No  evidence of mitral stenosis.   4. The aortic  valve is tricuspid. Aortic valve regurgitation is trivial .  No aortic stenosis is present.   5. The inferior vena cava is normal in size with greater than 50%  respiratory variability, suggesting right atrial pressure of 3 mmHg.   Comparison(s): A prior study was performed on 01/06/2017. Prior images  unable to be directly viewed, comparison made by report only. Comparison  of reports suggests improved LV function. Unable to assess regional wall  motion, however LVEF appears  hyperdynamic today.    ASSESSMENT:    1. CAD S/P percutaneous coronary angioplasty   2. Coronary artery disease involving native coronary artery of native heart with angina pectoris (HCC)   3. Essential hypertension   4. ILD (interstitial lung disease) (HCC)   5. OSA (obstructive sleep apnea)   6. Rheumatoid arthritis involving hand with positive rheumatoid factor, unspecified laterality (HCC)   7. Class 1 drug-induced obesity with serious comorbidity and body mass index (BMI) of 33.0 to 33.9 in adult     PLAN:  1.  CAD: Mr. Torain suffered a non-ST segment elevation MI on July 05, 2014 secondary to 99% ostial LAD stenosis.  He underwent successful DES stenting with a 3.5 x 20 mm Synergy stent which covered 3 LAD lesions.  Repeat catheterization in February 2017 due to recurrent chest pain revealed widely patent stent with mild 35% nonobstructive LAD disease.  He had recurrent chest pain In 2021 and underwent most recent catheterization on April 06, 2019 which showed a widely patent LAD stent without significant change in other coronary anatomy.  A nuclear perfusion study for recurrent symptomatology in July 2022 was low risk and showed normal perfusion.  When seen in November 2022 by Dr.Varansi,  Ranexa 500 mg twice a day was added to his regimen with benefit.  Presently he is without anginal symptomatology.  2.  Exertional dyspnea: Probably contributed by diastolic dysfunction as well as his interstitial lung  disease.  He had a Covid infection.  His last echo Doppler study in February 2021 showed  hyperdynamic LV function with EF 65 to 70% with grade 1 diastolic dysfunction.  .  3.  Interstitial lung disease: He is followed by Dr. [name], with most recent evaluation August 26, 2022.  Most recent chest CT was reviewed without significant interval change.  5.  Hyperlipidemia: Target LDL less than 70 in this patient with CAD.  He continues to be on atorvastatin 80 mg and Zetia 10 mg.  Most recent LDL was 58.    6.  OSA: On CPAP with 100% compliance followed by Dr.Alva.  7.  Obesity: BMI currently 33.25 which is improved from 35.07 last year.  He is aware of the importance of weight loss.  Mediterranean diet has been discussed.  He has been exercising at least 30 minutes on a treadmill.  I discussed my plans for future retirement most likely sometime next year.  Clinically he is stable.  I have suggested he transition to Dr. Herbie Baltimore in 1 year for follow-up Cardiologic evaluation  Medication Adjustments/Labs and Tests Ordered: Current medicines are reviewed at length with the patient today.  Concerns regarding medicines are outlined above.  Medication changes, Labs and Tests ordered today are listed in the Patient Instructions below. Patient Instructions  Medication Instructions:   No changes    Medication refilled *If you need a refill on your cardiac medications before your next appointment, please call your pharmacy*   Lab Work: Not needed    Testing/Procedures: Not needed   Follow-Up: At [facility], you and your health needs are our priority.  As part of our continuing mission to provide you with exceptional heart care, we have created designated Provider Care Teams.  These Care Teams include your primary Cardiologist (physician) and Advanced Practice Providers (APPs -  Physician Assistants and Nurse Practitioners) who all work together to provide you with the care you need, when you  need it.     Your next appointment:   12 month(s)  The format for your next appointment:   In Person  Provider:   Bryan Lemma, MD      Signed, Nicki Guadalajara, MD  11/21/2022 12:43 PM    [facility] [address], [place], [place]  [address] Phone: [phone]

## 2022-11-16 NOTE — Patient Instructions (Signed)
Medication Instructions:  No changes  Medication refilled  *If you need a refill on your cardiac medications before your next appointment, please call your pharmacy*   Lab Work: Not needed     Testing/Procedures: Not needed   Follow-Up: At [facility], you and your health needs are our priority.  As part of our continuing mission to provide you with exceptional heart care, we have created designated Provider Care Teams.  These Care Teams include your primary Cardiologist (physician) and Advanced Practice Providers (APPs -  Physician Assistants and Nurse Practitioners) who all work together to provide you with the care you need, when you need it.     Your next appointment:   12 month(s)  The format for your next appointment:   In Person  Provider:   Glenetta Hew, MD

## 2022-11-23 DIAGNOSIS — M5440 Lumbago with sciatica, unspecified side: Secondary | ICD-10-CM | POA: Diagnosis not present

## 2022-11-23 DIAGNOSIS — J849 Interstitial pulmonary disease, unspecified: Secondary | ICD-10-CM | POA: Diagnosis not present

## 2022-11-23 DIAGNOSIS — E669 Obesity, unspecified: Secondary | ICD-10-CM | POA: Diagnosis not present

## 2022-11-23 DIAGNOSIS — R7989 Other specified abnormal findings of blood chemistry: Secondary | ICD-10-CM | POA: Diagnosis not present

## 2022-11-23 DIAGNOSIS — Z6835 Body mass index (BMI) 35.0-35.9, adult: Secondary | ICD-10-CM | POA: Diagnosis not present

## 2022-11-23 DIAGNOSIS — M0609 Rheumatoid arthritis without rheumatoid factor, multiple sites: Secondary | ICD-10-CM | POA: Diagnosis not present

## 2022-11-27 DIAGNOSIS — Z23 Encounter for immunization: Secondary | ICD-10-CM | POA: Diagnosis not present

## 2022-12-06 DIAGNOSIS — R030 Elevated blood-pressure reading, without diagnosis of hypertension: Secondary | ICD-10-CM | POA: Diagnosis not present

## 2022-12-06 DIAGNOSIS — J019 Acute sinusitis, unspecified: Secondary | ICD-10-CM | POA: Diagnosis not present

## 2022-12-06 DIAGNOSIS — Z6835 Body mass index (BMI) 35.0-35.9, adult: Secondary | ICD-10-CM | POA: Diagnosis not present

## 2022-12-13 DIAGNOSIS — R030 Elevated blood-pressure reading, without diagnosis of hypertension: Secondary | ICD-10-CM | POA: Diagnosis not present

## 2022-12-13 DIAGNOSIS — Z6834 Body mass index (BMI) 34.0-34.9, adult: Secondary | ICD-10-CM | POA: Diagnosis not present

## 2022-12-13 DIAGNOSIS — J329 Chronic sinusitis, unspecified: Secondary | ICD-10-CM | POA: Diagnosis not present

## 2023-01-03 ENCOUNTER — Other Ambulatory Visit: Payer: Self-pay | Admitting: Cardiovascular Disease

## 2023-01-24 DIAGNOSIS — Z125 Encounter for screening for malignant neoplasm of prostate: Secondary | ICD-10-CM | POA: Diagnosis not present

## 2023-01-24 DIAGNOSIS — Z1321 Encounter for screening for nutritional disorder: Secondary | ICD-10-CM | POA: Diagnosis not present

## 2023-01-24 DIAGNOSIS — R7301 Impaired fasting glucose: Secondary | ICD-10-CM | POA: Diagnosis not present

## 2023-01-24 DIAGNOSIS — E7849 Other hyperlipidemia: Secondary | ICD-10-CM | POA: Diagnosis not present

## 2023-01-24 DIAGNOSIS — K219 Gastro-esophageal reflux disease without esophagitis: Secondary | ICD-10-CM | POA: Diagnosis not present

## 2023-01-24 DIAGNOSIS — Z1329 Encounter for screening for other suspected endocrine disorder: Secondary | ICD-10-CM | POA: Diagnosis not present

## 2023-01-24 DIAGNOSIS — Z0001 Encounter for general adult medical examination with abnormal findings: Secondary | ICD-10-CM | POA: Diagnosis not present

## 2023-01-24 DIAGNOSIS — I10 Essential (primary) hypertension: Secondary | ICD-10-CM | POA: Diagnosis not present

## 2023-02-01 DIAGNOSIS — I10 Essential (primary) hypertension: Secondary | ICD-10-CM | POA: Diagnosis not present

## 2023-02-01 DIAGNOSIS — Z1331 Encounter for screening for depression: Secondary | ICD-10-CM | POA: Diagnosis not present

## 2023-02-01 DIAGNOSIS — Z6834 Body mass index (BMI) 34.0-34.9, adult: Secondary | ICD-10-CM | POA: Diagnosis not present

## 2023-02-01 DIAGNOSIS — E7849 Other hyperlipidemia: Secondary | ICD-10-CM | POA: Diagnosis not present

## 2023-02-01 DIAGNOSIS — N529 Male erectile dysfunction, unspecified: Secondary | ICD-10-CM | POA: Diagnosis not present

## 2023-02-01 DIAGNOSIS — Z0001 Encounter for general adult medical examination with abnormal findings: Secondary | ICD-10-CM | POA: Diagnosis not present

## 2023-02-01 DIAGNOSIS — I25119 Atherosclerotic heart disease of native coronary artery with unspecified angina pectoris: Secondary | ICD-10-CM | POA: Diagnosis not present

## 2023-02-01 DIAGNOSIS — M069 Rheumatoid arthritis, unspecified: Secondary | ICD-10-CM | POA: Diagnosis not present

## 2023-02-01 DIAGNOSIS — Z1389 Encounter for screening for other disorder: Secondary | ICD-10-CM | POA: Diagnosis not present

## 2023-02-01 DIAGNOSIS — R7301 Impaired fasting glucose: Secondary | ICD-10-CM | POA: Diagnosis not present

## 2023-02-01 DIAGNOSIS — K2100 Gastro-esophageal reflux disease with esophagitis, without bleeding: Secondary | ICD-10-CM | POA: Diagnosis not present

## 2023-02-02 DIAGNOSIS — D485 Neoplasm of uncertain behavior of skin: Secondary | ICD-10-CM | POA: Diagnosis not present

## 2023-02-02 DIAGNOSIS — Z85828 Personal history of other malignant neoplasm of skin: Secondary | ICD-10-CM | POA: Diagnosis not present

## 2023-02-02 DIAGNOSIS — L820 Inflamed seborrheic keratosis: Secondary | ICD-10-CM | POA: Diagnosis not present

## 2023-02-23 DIAGNOSIS — Z23 Encounter for immunization: Secondary | ICD-10-CM | POA: Diagnosis not present

## 2023-05-02 DIAGNOSIS — G4733 Obstructive sleep apnea (adult) (pediatric): Secondary | ICD-10-CM | POA: Diagnosis not present

## 2023-05-11 DIAGNOSIS — C44519 Basal cell carcinoma of skin of other part of trunk: Secondary | ICD-10-CM | POA: Diagnosis not present

## 2023-05-11 DIAGNOSIS — L570 Actinic keratosis: Secondary | ICD-10-CM | POA: Diagnosis not present

## 2023-05-11 DIAGNOSIS — Z85828 Personal history of other malignant neoplasm of skin: Secondary | ICD-10-CM | POA: Diagnosis not present

## 2023-05-11 DIAGNOSIS — D225 Melanocytic nevi of trunk: Secondary | ICD-10-CM | POA: Diagnosis not present

## 2023-05-11 DIAGNOSIS — L821 Other seborrheic keratosis: Secondary | ICD-10-CM | POA: Diagnosis not present

## 2023-05-11 DIAGNOSIS — D485 Neoplasm of uncertain behavior of skin: Secondary | ICD-10-CM | POA: Diagnosis not present

## 2023-05-11 DIAGNOSIS — L814 Other melanin hyperpigmentation: Secondary | ICD-10-CM | POA: Diagnosis not present

## 2023-05-11 DIAGNOSIS — D692 Other nonthrombocytopenic purpura: Secondary | ICD-10-CM | POA: Diagnosis not present

## 2023-05-25 DIAGNOSIS — R7989 Other specified abnormal findings of blood chemistry: Secondary | ICD-10-CM | POA: Diagnosis not present

## 2023-05-25 DIAGNOSIS — M5440 Lumbago with sciatica, unspecified side: Secondary | ICD-10-CM | POA: Diagnosis not present

## 2023-05-25 DIAGNOSIS — M0609 Rheumatoid arthritis without rheumatoid factor, multiple sites: Secondary | ICD-10-CM | POA: Diagnosis not present

## 2023-05-25 DIAGNOSIS — M7989 Other specified soft tissue disorders: Secondary | ICD-10-CM | POA: Diagnosis not present

## 2023-05-25 DIAGNOSIS — J849 Interstitial pulmonary disease, unspecified: Secondary | ICD-10-CM | POA: Diagnosis not present

## 2023-05-25 DIAGNOSIS — E669 Obesity, unspecified: Secondary | ICD-10-CM | POA: Diagnosis not present

## 2023-05-25 DIAGNOSIS — Z6835 Body mass index (BMI) 35.0-35.9, adult: Secondary | ICD-10-CM | POA: Diagnosis not present

## 2023-06-29 DIAGNOSIS — Z6834 Body mass index (BMI) 34.0-34.9, adult: Secondary | ICD-10-CM | POA: Diagnosis not present

## 2023-06-29 DIAGNOSIS — R051 Acute cough: Secondary | ICD-10-CM | POA: Diagnosis not present

## 2023-06-29 DIAGNOSIS — J209 Acute bronchitis, unspecified: Secondary | ICD-10-CM | POA: Diagnosis not present

## 2023-06-29 DIAGNOSIS — Z20828 Contact with and (suspected) exposure to other viral communicable diseases: Secondary | ICD-10-CM | POA: Diagnosis not present

## 2023-07-04 DIAGNOSIS — Z6821 Body mass index (BMI) 21.0-21.9, adult: Secondary | ICD-10-CM | POA: Diagnosis not present

## 2023-07-04 DIAGNOSIS — J209 Acute bronchitis, unspecified: Secondary | ICD-10-CM | POA: Diagnosis not present

## 2023-07-29 DIAGNOSIS — Z6833 Body mass index (BMI) 33.0-33.9, adult: Secondary | ICD-10-CM | POA: Diagnosis not present

## 2023-07-29 DIAGNOSIS — M25511 Pain in right shoulder: Secondary | ICD-10-CM | POA: Diagnosis not present

## 2023-08-03 ENCOUNTER — Other Ambulatory Visit: Payer: Self-pay

## 2023-08-03 MED ORDER — PANTOPRAZOLE SODIUM 40 MG PO TBEC: 40.0000 mg | DELAYED_RELEASE_TABLET | Freq: Every day | ORAL | 0 refills | Status: DC

## 2023-08-08 DIAGNOSIS — Z1321 Encounter for screening for nutritional disorder: Secondary | ICD-10-CM | POA: Diagnosis not present

## 2023-08-08 DIAGNOSIS — Z1329 Encounter for screening for other suspected endocrine disorder: Secondary | ICD-10-CM | POA: Diagnosis not present

## 2023-08-08 DIAGNOSIS — E7849 Other hyperlipidemia: Secondary | ICD-10-CM | POA: Diagnosis not present

## 2023-08-08 DIAGNOSIS — I10 Essential (primary) hypertension: Secondary | ICD-10-CM | POA: Diagnosis not present

## 2023-08-08 DIAGNOSIS — R7301 Impaired fasting glucose: Secondary | ICD-10-CM | POA: Diagnosis not present

## 2023-08-15 DIAGNOSIS — I25119 Atherosclerotic heart disease of native coronary artery with unspecified angina pectoris: Secondary | ICD-10-CM | POA: Diagnosis not present

## 2023-08-15 DIAGNOSIS — E7849 Other hyperlipidemia: Secondary | ICD-10-CM | POA: Diagnosis not present

## 2023-08-15 DIAGNOSIS — E782 Mixed hyperlipidemia: Secondary | ICD-10-CM | POA: Diagnosis not present

## 2023-08-15 DIAGNOSIS — N529 Male erectile dysfunction, unspecified: Secondary | ICD-10-CM | POA: Diagnosis not present

## 2023-08-15 DIAGNOSIS — I10 Essential (primary) hypertension: Secondary | ICD-10-CM | POA: Diagnosis not present

## 2023-08-15 DIAGNOSIS — Z6833 Body mass index (BMI) 33.0-33.9, adult: Secondary | ICD-10-CM | POA: Diagnosis not present

## 2023-08-16 ENCOUNTER — Encounter: Payer: Self-pay | Admitting: *Deleted

## 2023-08-16 ENCOUNTER — Encounter: Payer: Self-pay | Admitting: Internal Medicine

## 2023-08-16 ENCOUNTER — Ambulatory Visit: Admitting: Internal Medicine

## 2023-08-16 ENCOUNTER — Other Ambulatory Visit: Payer: Self-pay | Admitting: *Deleted

## 2023-08-16 VITALS — BP 113/73 | HR 53 | Temp 97.9°F | Ht 73.0 in | Wt 254.2 lb

## 2023-08-16 DIAGNOSIS — K219 Gastro-esophageal reflux disease without esophagitis: Secondary | ICD-10-CM

## 2023-08-16 DIAGNOSIS — E669 Obesity, unspecified: Secondary | ICD-10-CM | POA: Diagnosis not present

## 2023-08-16 MED ORDER — NA SULFATE-K SULFATE-MG SULF 17.5-3.13-1.6 GM/177ML PO SOLN: 1.0000 | Freq: Once | ORAL | 0 refills | Status: AC

## 2023-08-16 MED ORDER — HYOSCYAMINE SULFATE 0.125 MG SL SUBL: 0.1250 mg | SUBLINGUAL_TABLET | Freq: Three times a day (TID) | SUBLINGUAL | 3 refills | Status: DC | PRN

## 2023-08-16 MED ORDER — PANTOPRAZOLE SODIUM 40 MG PO TBEC: 40.0000 mg | DELAYED_RELEASE_TABLET | Freq: Every day | ORAL | 3 refills | Status: AC

## 2023-08-16 NOTE — Patient Instructions (Signed)
 Good to see you again today in the office!  Continue pantoprazole  40 mg daily.  Dispense 90 with 3 refills.  Remember to take this medication 30 minutes before breakfast.  It will work best taken in that manner.  Lets plan for a follow-up colonoscopy-history of polyps in the next couple of months (ASA 3/room 1 okay)  Your intermittent gas bloat symptoms are nonspecific.  You have had an extensive GI workup without any significant findings.  Trial of Levsin  sublingual 0.125 mg tablets take 1 under the tongue or swallow with water  20 to 30 minutes before meals as needed for bloating/discomfort..  Dispense 60 with 3 refills.  Also use Gas-X as needed  Information on anti-gas bloat diet provided. / try FODMAP diet.  Will retrieve labs from Dr. Blondie office for review.  Further recommendations to follow.

## 2023-08-16 NOTE — Progress Notes (Signed)
 Primary Care Physician:  [facility] Primary Gastroenterologist:  Dr. Shaaron  Pre-Procedure History & Physical: HPI:  Richard Simpson is a 53 y.o. male here for follow-up GERD.  History colonic adenoma removed 2018.  Multiple GI issues along the way including abnormal duodenal mass which led to EUS for which revealed no abnormalities; undulating Z-line but no Barrett's esophagus on histology.  Gastric and duodenal biopsies negative.  Norovirus on GIP. Illness last year which resolved with antibiotics prescribed by PCP.  Chronic postprandial right sided abdominal bloating and discomfort.  Denies constipation 1 good bowel movement daily.  Atypical presentation of gallbladder disease ultimately, had gangrenous cholecystitis cholecystectomy.  He notes symptoms are worse when he overeats.  He has history ischemic cardiomyopathy.  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  sleep apnea.  weight remains good.  EUS findings years ago demonstrated subtle changes in the pancreas not meeting criteria for chronic pancreatitis.  Endosonographer mentioned to the pancreatic enzyme replacement therapy should symptoms progress.  Past Medical History:  Diagnosis Date   Anxiety    CAD S/P percutaneous coronary angioplasty 10/2013   a. 10/2013 Cath: mod LAD dzs->nl FFR-> medically managed;  b. 06/2014 NSTEMI/PCi: LAD 99ost, 70p, 28m (entire area covered with a 3.5x20 Synergy DES), LCX nl, RCA 67m, 10d, EF 45-50%. c. 10/2015: cath showing patent LAD stent with 15% Prox LAD stenosis. EF 55-65%.    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 06/2014 EF 45-50% by LV gram b.  Echo July 2016: EF 60-65%. No RWMA. Gr 1 DD    NSTEMI (non-ST elevated myocardial infarction) (HCC) 06/2014   Obesity (BMI 30-39.9) 10/29/2013   Primary snoring 10/29/2013   Rheumatoid arthritis (HCC) 09/23/2013   RLS (restless legs syndrome) 10/29/2013   Sleep apnea     Past Surgical History:  Procedure Laterality Date   BIOPSY  09/27/2018   Procedure: BIOPSY;  Surgeon: Shaaron Lamar HERO, MD;  [facility];  Service: Endoscopy;;   BIOPSY  04/13/2021   Procedure: BIOPSY;  Surgeon: Wilhelmenia Aloha Raddle., MD;  Location: [facility];  Service: Endoscopy;;   CARDIAC CATHETERIZATION N/A 07/05/2014   Procedure: Left Heart Cath and Coronary Angiography;  Surgeon: Debby DELENA Sor, MD;  Location: [facility] INVASIVE CV LAB;  Service: Cardiovascular;  NSTEMI --> Ostial LAd 99%, prox 80% & early mid 50% --> PCI   CARDIAC CATHETERIZATION  07/05/2014   Procedure: Coronary Stent Intervention;  Surgeon: Debby DELENA Sor, MD;  Location: [facility]VAS[facility] CV LAB;  Service: Cardiovascular; Ostial-prox LAD tandem 99, 80 & 50% --> Synergy DES 3.5 x 20 (3.74)   CARDIAC CATHETERIZATION N/A 03/24/2015   Procedure: Left Heart Cath and Coronary Angiography;  Surgeon: Alm LELON Clay, MD;  Location: [facility] INVASIVE CV LAB;  Service: Cardiovascular;  Laterality: N/A;   CARDIAC CATHETERIZATION N/A 11/20/2015   Procedure: Left Heart Cath and Coronary Angiography;  Surgeon: Debby DELENA Sor, MD;  Location: [facility] CV LAB;  Service: Cardiovascular;  Laterality: N/A;   CHOLECYSTECTOMY     10 yrs ago-jenkins   COLONOSCOPY N/A 03/23/2013   Procedure: COLONOSCOPY;  Surgeon:  Lamar CHRISTELLA Hollingshead, MD;  [facility];  Service: Endoscopy;  Laterality: N/A;  2:15-moved to 1030 Staff notified pt   COLONOSCOPY WITH PROPOFOL  N/A 06/10/2016   Procedure: COLONOSCOPY WITH PROPOFOL ;  Surgeon: Hollingshead Lamar CHRISTELLA, MD;  [facility];  Service: Endoscopy;  Laterality: N/A;  730    ESOPHAGOGASTRODUODENOSCOPY N/A 04/13/2021   Procedure:  ESOPHAGOGASTRODUODENOSCOPY (EGD);  Surgeon: Wilhelmenia Aloha Raddle., MD;  Location: [facility];  Service: Endoscopy;  Laterality: N/A;   ESOPHAGOGASTRODUODENOSCOPY (EGD) WITH PROPOFOL  N/A 09/27/2018   Procedure: ESOPHAGOGASTRODUODENOSCOPY (EGD) WITH PROPOFOL ;  Surgeon: Hollingshead Lamar CHRISTELLA, MD;  [facility];  Service: Endoscopy;  Laterality: N/A;  1:30pm   EUS N/A 04/13/2021   Procedure: UPPER ENDOSCOPIC ULTRASOUND (EUS) RADIAL;  Surgeon: Wilhelmenia Aloha Raddle., MD;  Location: [facility];  Service: Endoscopy;  Laterality: N/A;   FRACTIONAL FLOW RESERVE WIRE  11/23/2013   Procedure: FRACTIONAL FLOW RESERVE WIRE;  Surgeon: Candyce GORMAN Reek, MD;  Location: [facility] CATH LAB;  Service: Cardiovascular;;   GALLBLADDER SURGERY     LEFT HEART CATHETERIZATION WITH CORONARY ANGIOGRAM N/A 11/23/2013   Procedure: LEFT HEART CATHETERIZATION WITH CORONARY ANGIOGRAM;  Surgeon: Candyce GORMAN Reek, MD;  Location: [facility] CATH LAB;  Service: Cardiovascular;  Laterality: N/A;   MASS EXCISION  09/09/2010   Procedure: EXCISION MASS;  Surgeon: Taft Minerva, MD;  Location: [facility];  Service: Orthopedics;  Laterality: Left;  Excision Mass Left Long Finger   POLYPECTOMY  06/10/2016   Procedure: POLYPECTOMY;  Surgeon: Hollingshead Lamar CHRISTELLA, MD;  [facility];  Service: Endoscopy;;  colon   RIGHT/LEFT HEART CATH AND CORONARY ANGIOGRAPHY N/A 04/06/2019   Procedure: RIGHT/LEFT HEART CATH AND CORONARY ANGIOGRAPHY;  Surgeon: Swaziland, Peter M, MD;  Location: [facility] INVASIVE CV LAB;  Service: Cardiovascular;  Laterality: N/A;   THROAT SURGERY     nodules removed from throat as child   TRANSTHORACIC ECHOCARDIOGRAM   July 2016:    EF 60-65%. No RWMA. Gr 1 DD    Prior to Admission medications   Medication Sig Start Date End Date Taking? Authorizing Provider  amLODipine  (NORVASC ) 5 MG tablet Take 1 tablet (5 mg total) by mouth daily. 01/03/23  Yes Burnard Debby LABOR, MD  aspirin  EC 81 MG tablet Take 1 tablet (81 mg total) by  mouth daily. 12/04/13  Yes Army Katheryn BROCKS, NP  atorvastatin  (LIPITOR ) 80 MG tablet Take 1 tablet (80 mg total) by mouth daily. 11/16/22  Yes Burnard Debby LABOR, MD  clonazePAM  (KLONOPIN ) 1 MG tablet Take 1 mg by mouth 3 (three) times daily as needed for anxiety. Take 1mg  tablet by mouth scheduled at bedtime, may take an additional dose if needed for anxiety   Yes [provider]  clopidogrel  (PLAVIX ) 75 MG tablet Take 1 tablet (75 mg total) by mouth daily. 11/16/22  Yes Burnard Debby LABOR, MD  ezetimibe  (ZETIA ) 10 MG tablet Take 1 tablet (10 mg total) by mouth daily. 11/16/22  Yes Burnard Debby LABOR, MD  furosemide  (LASIX ) 20 MG tablet Take 1 tablet (20 mg total) by mouth daily. 11/16/22  Yes Burnard Debby LABOR, MD  loratadine  (CLARITIN ) 10 MG tablet Take 10 mg by mouth daily.    Yes [provider]  losartan  (COZAAR ) 25 MG tablet Take 0.5 tablets (12.5 mg total) by mouth daily. Take in the evening 11/16/22  Yes Burnard Debby LABOR, MD  metoprolol  tartrate (LOPRESSOR ) 25 MG tablet Take 1.5 tablets (37.5 mg total) by mouth 2 (two) times daily. 11/16/22  Yes Burnard Debby LABOR, MD  mycophenolate (CELLCEPT) 500 MG tablet Take 1,000 mg by mouth 2 (two) times daily. Takes 2 tablets in the morning and 2 tablets in the evening (2,000 mg total) 04/28/17  Yes [provider]  nitroGLYCERIN  (NITROSTAT ) 0.4 MG SL tablet DISSOLVE 1 TABLET UNDER TONGUE FOR CHEST TIGHTNESS AND SHORTNESS OF BREATH. MAY TAKE EVERY 5 MINUTES UP TO 3 DOSES. IF NO RELIEF AFTER 3 DOS 12/10/20  Yes Burnard Debby LABOR, MD  nortriptyline  (PAMELOR ) 10 MG capsule Take 1 capsule (10 mg total) by mouth at bedtime. 05/19/21  Yes Rachyl Wuebker, Lamar HERO, MD  pantoprazole  (PROTONIX ) 40 MG tablet Take 1 tablet (40 mg total) by mouth daily. 08/03/23  Yes RourkLamar HERO, MD    Allergies as of 08/16/2023 - Review Complete 08/16/2023  Allergen Reaction Noted   Lisinopril  Cough 07/12/2014   Prednisone  Other (See Comments) 10/29/2013    Family History   Problem Relation Age of Onset   Hypertension Mother    Diabetes Mother    Arthritis Paternal Uncle    Cancer Maternal Grandfather    Hypertension Maternal Grandmother    Diabetes Maternal Grandmother    Diabetes Other    Diabetes Other    Diabetes Paternal Grandfather    Anesthesia problems Neg Hx    Hypotension Neg Hx    Malignant hyperthermia Neg Hx    Pseudochol deficiency Neg Hx    Heart attack Neg Hx    Stroke Neg Hx     Social History   Socioeconomic History   Marital status: Married    Spouse name: Renee   Number of children: 2   Years of education: 12th grade   Highest education level: Not on file  Occupational History   Occupation: Retired Civil engineer, contracting: [employer]  Tobacco Use   Smoking status: Former    Current packs/day: 0.00    Average packs/day: 0.3 packs/day for 15.0 years (3.8 ttl pk-yrs)    Types: Cigarettes    Start date: 09/03/1988    Quit date: 09/04/2003    Years since quitting: 19.9   Smokeless tobacco: Current    Types: Chew  Vaping Use   Vaping status: Never Used  Substance and Sexual Activity   Alcohol  use: Yes    Alcohol /week: 2.0 - 3.0 standard drinks of alcohol     Types: 2 - 3 Cans of beer per week    Comment: rare   Drug use: No   Sexual activity: Yes  Other Topics Concern   Not on file  Social History Narrative   Patient lives at home with his wife Gretta).    Patient works full time Marketing executive..   Education high school.   Right handed.   Caffeine one cup of coffee daily and one soda.   Social Drivers of Corporate investment banker Strain: Not on file  Food Insecurity: Not on file  Transportation Needs: Not on file  Physical Activity: Not on file  Stress: Not on file  Social Connections: Not on file  Intimate Partner Violence: Not on file    Review of Systems: See HPI, otherwise negative ROS  Physical Exam: BP 113/73 (BP Location: Right Arm, Patient Position: Sitting, Cuff Size:  Large)   Pulse (!) 53   Temp 97.9 F (36.6 C) (Oral)   Ht 6' 1 (1.854 m)   Wt 254 lb 3.2 oz (115.3 kg)   SpO2 97%   BMI 33.54 kg/m  General:   Alert,  Well-developed, well-nourished, pleasant and cooperative in NAD nopathy. Lungs:  Clear throughout to auscultation.   No wheezes, crackles, or rhonchi. No acute distress. Heart:  Regular rate and rhythm; no murmurs, clicks, rubs,  or gallops. Abdomen: Non-distended, normal bowel sounds.  Soft and nontender without appreciable mass or hepatosplenomegaly.   Impression/Plan: 53 year old gentleman with longstanding GERD well-controlled on once daily PPI.  History of gangrenous cholecystitis status post cholecystectomy chronic right upper quadrant pain minimal elevation of ALT along the way.  Felt to be medication related. Chronic right-sided abdominal pain following cholecystectomy.  Intermittent postprandial bloating.  Denies diarrhea or vomiting.  EUS demonstrated no duodenal pancreatic mass but suggested early changes since chronic pancreatitis.  History of colonic adenoma removed 2018; due for surveillance examination this year.  I have recent labs from Dr. Mai in [place] 5/ 1/25: White count normal H&H 16.1/47.5 c-Met completely normal except for total bilirubin of 1.3 alkaline phosphatase normal.  No fractionation CRP less than 1 AST 26 ALT 38; ALT was 59 previously  Labs from 11/23/2022 total bilirubin 1.4 direct .36, alkaline phosphatase 79.  Bilirubin fractionation consistent with Gilbert's syndrome  Recommendations:  Continue pantoprazole  40 mg daily.  Dispense 90 with 3 refills.  Remember to take this medication 30 minutes before breakfast.  It will work best taken in that manner.  Lets plan for a follow-up colonoscopy-history of polyps in the next couple of months (ASA 3/room 1 okay)  intermittent gas bloat symptoms are nonspecific.  extensive GI workup without any significant findings.  Trial of Levsin  sublingual 0.125  mg tablets take 1 under the tongue or swallow with water  20 to 30 minutes before meals as needed for bloating/discomfort..  Dispense 60 with 3 refills.  Also use Gas-X as needed  Information on anti-gas bloat diet provided. / try FODMAP diet.  Will consider a trial of pancreatic enzyme therapy in the future.  20 pound weight loss recommended  Further recommendations to follow.   Notice: This dictation was prepared with Dragon dictation along with smaller phrase technology. Any transcriptional errors that result from this process are unintentional and may not be corrected upon review.

## 2023-08-16 NOTE — H&P (View-Only) (Signed)
 Primary Care Physician:  [facility] Primary Gastroenterologist:  Dr. Shaaron  Pre-Procedure History & Physical: HPI:  Richard Simpson is a 53 y.o. male here for follow-up GERD.  History colonic adenoma removed 2018.  Multiple GI issues along the way including abnormal duodenal mass which led to EUS for which revealed no abnormalities; undulating Z-line but no Barrett's esophagus on histology.  Gastric and duodenal biopsies negative.  Norovirus on GIP. Illness last year which resolved with antibiotics prescribed by PCP.  Chronic postprandial right sided abdominal bloating and discomfort.  Denies constipation 1 good bowel movement daily.  Atypical presentation of gallbladder disease ultimately, had gangrenous cholecystitis cholecystectomy.  He notes symptoms are worse when he overeats.  He has history ischemic cardiomyopathy.  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  sleep apnea.  weight remains good.  EUS findings years ago demonstrated subtle changes in the pancreas not meeting criteria for chronic pancreatitis.  Endosonographer mentioned to the pancreatic enzyme replacement therapy should symptoms progress.  Past Medical History:  Diagnosis Date   Anxiety    CAD S/P percutaneous coronary angioplasty 10/2013   a. 10/2013 Cath: mod LAD dzs->nl FFR-> medically managed;  b. 06/2014 NSTEMI/PCi: LAD 99ost, 70p, 28m (entire area covered with a 3.5x20 Synergy DES), LCX nl, RCA 67m, 10d, EF 45-50%. c. 10/2015: cath showing patent LAD stent with 15% Prox LAD stenosis. EF 55-65%.    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 06/2014 EF 45-50% by LV gram b.  Echo July 2016: EF 60-65%. No RWMA. Gr 1 DD    NSTEMI (non-ST elevated myocardial infarction) (HCC) 06/2014   Obesity (BMI 30-39.9) 10/29/2013   Primary snoring 10/29/2013   Rheumatoid arthritis (HCC) 09/23/2013   RLS (restless legs syndrome) 10/29/2013   Sleep apnea     Past Surgical History:  Procedure Laterality Date   BIOPSY  09/27/2018   Procedure: BIOPSY;  Surgeon: Shaaron Lamar HERO, MD;  [facility]: [facility];  Service: Endoscopy;;   BIOPSY  04/13/2021   Procedure: BIOPSY;  Surgeon: Wilhelmenia Aloha Raddle., MD;  Location: [facility];  Service: Endoscopy;;   CARDIAC CATHETERIZATION N/A 07/05/2014   Procedure: Left Heart Cath and Coronary Angiography;  Surgeon: Debby DELENA Sor, MD;  Location: [facility];  Service: Cardiovascular;  NSTEMI --> Ostial LAd 99%, prox 80% & early mid 50% --> PCI   CARDIAC CATHETERIZATION  07/05/2014   Procedure: Coronary Stent Intervention;  Surgeon: Debby DELENA Sor, MD;  Location: [facility];  Service: Cardiovascular; Ostial-prox LAD tandem 99, 80 & 50% --> Synergy DES 3.5 x 20 (3.74)   CARDIAC CATHETERIZATION N/A 03/24/2015   Procedure: Left Heart Cath and Coronary Angiography;  Surgeon: Alm LELON Clay, MD;  Location: [facility] INVASIVE CV LAB;  Service: Cardiovascular;  Laterality: N/A;   CARDIAC CATHETERIZATION N/A 11/20/2015   Procedure: Left Heart Cath and Coronary Angiography;  Surgeon: Debby DELENA Sor, MD;  Location: [facility];  Service: Cardiovascular;  Laterality: N/A;   CHOLECYSTECTOMY     10 yrs ago-jenkins   COLONOSCOPY N/A 03/23/2013   Procedure: COLONOSCOPY;  Surgeon:  Lamar CHRISTELLA Hollingshead, MD;  [facility];  Service: Endoscopy;  Laterality: N/A;  2:15-moved to 1030 Staff notified pt   COLONOSCOPY WITH PROPOFOL  N/A 06/10/2016   Procedure: COLONOSCOPY WITH PROPOFOL ;  Surgeon: Hollingshead Lamar CHRISTELLA, MD;  [facility];  Service: Endoscopy;  Laterality: N/A;  730    ESOPHAGOGASTRODUODENOSCOPY N/A 04/13/2021   Procedure:  ESOPHAGOGASTRODUODENOSCOPY (EGD);  Surgeon: Wilhelmenia Aloha Raddle., MD;  Location: [facility];  Service: Endoscopy;  Laterality: N/A;   ESOPHAGOGASTRODUODENOSCOPY (EGD) WITH PROPOFOL  N/A 09/27/2018   Procedure: ESOPHAGOGASTRODUODENOSCOPY (EGD) WITH PROPOFOL ;  Surgeon: Hollingshead Lamar CHRISTELLA, MD;  [facility];  Service: Endoscopy;  Laterality: N/A;  1:30pm   EUS N/A 04/13/2021   Procedure: UPPER ENDOSCOPIC ULTRASOUND (EUS) RADIAL;  Surgeon: Wilhelmenia Aloha Raddle., MD;  Location: [facility];  Service: Endoscopy;  Laterality: N/A;   FRACTIONAL FLOW RESERVE WIRE  11/23/2013   Procedure: FRACTIONAL FLOW RESERVE WIRE;  Surgeon: Candyce GORMAN Reek, MD;  Location: [facility] CATH LAB;  Service: Cardiovascular;;   GALLBLADDER SURGERY     LEFT HEART CATHETERIZATION WITH CORONARY ANGIOGRAM N/A 11/23/2013   Procedure: LEFT HEART CATHETERIZATION WITH CORONARY ANGIOGRAM;  Surgeon: Candyce GORMAN Reek, MD;  Location: [facility] CATH LAB;  Service: Cardiovascular;  Laterality: N/A;   MASS EXCISION  09/09/2010   Procedure: EXCISION MASS;  Surgeon: Taft Minerva, MD;  Location: [facility];  Service: Orthopedics;  Laterality: Left;  Excision Mass Left Long Finger   POLYPECTOMY  06/10/2016   Procedure: POLYPECTOMY;  Surgeon: Hollingshead Lamar CHRISTELLA, MD;  [facility];  Service: Endoscopy;;  colon   RIGHT/LEFT HEART CATH AND CORONARY ANGIOGRAPHY N/A 04/06/2019   Procedure: RIGHT/LEFT HEART CATH AND CORONARY ANGIOGRAPHY;  Surgeon: Swaziland, Peter M, MD;  Location: [facility] INVASIVE CV LAB;  Service: Cardiovascular;  Laterality: N/A;   THROAT SURGERY     nodules removed from throat as child   TRANSTHORACIC ECHOCARDIOGRAM   July 2016:    EF 60-65%. No RWMA. Gr 1 DD    Prior to Admission medications   Medication Sig Start Date End Date Taking? Authorizing Provider  amLODipine  (NORVASC ) 5 MG tablet Take 1 tablet (5 mg total) by mouth daily. 01/03/23  Yes Burnard Debby LABOR, MD  aspirin  EC 81 MG tablet Take 1 tablet (81 mg total) by  mouth daily. 12/04/13  Yes Army Katheryn BROCKS, NP  atorvastatin  (LIPITOR ) 80 MG tablet Take 1 tablet (80 mg total) by mouth daily. 11/16/22  Yes Burnard Debby LABOR, MD  clonazePAM  (KLONOPIN ) 1 MG tablet Take 1 mg by mouth 3 (three) times daily as needed for anxiety. Take 1mg  tablet by mouth scheduled at bedtime, may take an additional dose if needed for anxiety   Yes [provider]  clopidogrel  (PLAVIX ) 75 MG tablet Take 1 tablet (75 mg total) by mouth daily. 11/16/22  Yes Burnard Debby LABOR, MD  ezetimibe  (ZETIA ) 10 MG tablet Take 1 tablet (10 mg total) by mouth daily. 11/16/22  Yes Burnard Debby LABOR, MD  furosemide  (LASIX ) 20 MG tablet Take 1 tablet (20 mg total) by mouth daily. 11/16/22  Yes Burnard Debby LABOR, MD  loratadine  (CLARITIN ) 10 MG tablet Take 10 mg by mouth daily.    Yes [provider]  losartan  (COZAAR ) 25 MG tablet Take 0.5 tablets (12.5 mg total) by mouth daily. Take in the evening 11/16/22  Yes Burnard Debby LABOR, MD  metoprolol  tartrate (LOPRESSOR ) 25 MG tablet Take 1.5 tablets (37.5 mg total) by mouth 2 (two) times daily. 11/16/22  Yes Burnard Debby LABOR, MD  mycophenolate (CELLCEPT) 500 MG tablet Take 1,000 mg by mouth 2 (two) times daily. Takes 2 tablets in the morning and 2 tablets in the evening (2,000 mg total) 04/28/17  Yes [provider]  nitroGLYCERIN  (NITROSTAT ) 0.4 MG SL tablet DISSOLVE 1 TABLET UNDER TONGUE FOR CHEST TIGHTNESS AND SHORTNESS OF BREATH. MAY TAKE EVERY 5 MINUTES UP TO 3 DOSES. IF NO RELIEF AFTER 3 DOS 12/10/20  Yes Burnard Debby LABOR, MD  nortriptyline  (PAMELOR ) 10 MG capsule Take 1 capsule (10 mg total) by mouth at bedtime. 05/19/21  Yes Rachyl Wuebker, Lamar HERO, MD  pantoprazole  (PROTONIX ) 40 MG tablet Take 1 tablet (40 mg total) by mouth daily. 08/03/23  Yes RourkLamar HERO, MD    Allergies as of 08/16/2023 - Review Complete 08/16/2023  Allergen Reaction Noted   Lisinopril  Cough 07/12/2014   Prednisone  Other (See Comments) 10/29/2013    Family History   Problem Relation Age of Onset   Hypertension Mother    Diabetes Mother    Arthritis Paternal Uncle    Cancer Maternal Grandfather    Hypertension Maternal Grandmother    Diabetes Maternal Grandmother    Diabetes Other    Diabetes Other    Diabetes Paternal Grandfather    Anesthesia problems Neg Hx    Hypotension Neg Hx    Malignant hyperthermia Neg Hx    Pseudochol deficiency Neg Hx    Heart attack Neg Hx    Stroke Neg Hx     Social History   Socioeconomic History   Marital status: Married    Spouse name: Renee   Number of children: 2   Years of education: 12th grade   Highest education level: Not on file  Occupational History   Occupation: Retired Civil engineer, contracting: [employer]  Tobacco Use   Smoking status: Former    Current packs/day: 0.00    Average packs/day: 0.3 packs/day for 15.0 years (3.8 ttl pk-yrs)    Types: Cigarettes    Start date: 09/03/1988    Quit date: 09/04/2003    Years since quitting: 19.9   Smokeless tobacco: Current    Types: Chew  Vaping Use   Vaping status: Never Used  Substance and Sexual Activity   Alcohol  use: Yes    Alcohol /week: 2.0 - 3.0 standard drinks of alcohol     Types: 2 - 3 Cans of beer per week    Comment: rare   Drug use: No   Sexual activity: Yes  Other Topics Concern   Not on file  Social History Narrative   Patient lives at home with his wife Gretta).    Patient works full time Marketing executive..   Education high school.   Right handed.   Caffeine one cup of coffee daily and one soda.   Social Drivers of Corporate investment banker Strain: Not on file  Food Insecurity: Not on file  Transportation Needs: Not on file  Physical Activity: Not on file  Stress: Not on file  Social Connections: Not on file  Intimate Partner Violence: Not on file    Review of Systems: See HPI, otherwise negative ROS  Physical Exam: BP 113/73 (BP Location: Right Arm, Patient Position: Sitting, Cuff Size:  Large)   Pulse (!) 53   Temp 97.9 F (36.6 C) (Oral)   Ht 6' 1 (1.854 m)   Wt 254 lb 3.2 oz (115.3 kg)   SpO2 97%   BMI 33.54 kg/m  General:   Alert,  Well-developed, well-nourished, pleasant and cooperative in NAD nopathy. Lungs:  Clear throughout to auscultation.   No wheezes, crackles, or rhonchi. No acute distress. Heart:  Regular rate and rhythm; no murmurs, clicks, rubs,  or gallops. Abdomen: Non-distended, normal bowel sounds.  Soft and nontender without appreciable mass or hepatosplenomegaly.   Impression/Plan: 53 year old gentleman with longstanding GERD well-controlled on once daily PPI.  History of gangrenous cholecystitis status post cholecystectomy chronic right upper quadrant pain minimal elevation of ALT along the way.  Felt to be medication related. Chronic right-sided abdominal pain following cholecystectomy.  Intermittent postprandial bloating.  Denies diarrhea or vomiting.  EUS demonstrated no duodenal pancreatic mass but suggested early changes since chronic pancreatitis.  History of colonic adenoma removed 2018; due for surveillance examination this year.  I have recent labs from Dr. Mai in [place] 5/ 1/25: White count normal H&H 16.1/47.5 c-Met completely normal except for total bilirubin of 1.3 alkaline phosphatase normal.  No fractionation CRP less than 1 AST 26 ALT 38; ALT was 59 previously  Labs from 11/23/2022 total bilirubin 1.4 direct .36, alkaline phosphatase 79.  Bilirubin fractionation consistent with Gilbert's syndrome  Recommendations:  Continue pantoprazole  40 mg daily.  Dispense 90 with 3 refills.  Remember to take this medication 30 minutes before breakfast.  It will work best taken in that manner.  Lets plan for a follow-up colonoscopy-history of polyps in the next couple of months (ASA 3/room 1 okay)  intermittent gas bloat symptoms are nonspecific.  extensive GI workup without any significant findings.  Trial of Levsin  sublingual 0.125  mg tablets take 1 under the tongue or swallow with water  20 to 30 minutes before meals as needed for bloating/discomfort..  Dispense 60 with 3 refills.  Also use Gas-X as needed  Information on anti-gas bloat diet provided. / try FODMAP diet.  Will consider a trial of pancreatic enzyme therapy in the future.  20 pound weight loss recommended  Further recommendations to follow.   Notice: This dictation was prepared with Dragon dictation along with smaller phrase technology. Any transcriptional errors that result from this process are unintentional and may not be corrected upon review.

## 2023-08-30 NOTE — Progress Notes (Signed)
 This encounter was created in error - please disregard.

## 2023-09-05 DIAGNOSIS — M0609 Rheumatoid arthritis without rheumatoid factor, multiple sites: Secondary | ICD-10-CM | POA: Diagnosis not present

## 2023-09-07 ENCOUNTER — Encounter: Payer: Self-pay | Admitting: Internal Medicine

## 2023-09-15 ENCOUNTER — Ambulatory Visit (HOSPITAL_COMMUNITY): Admitting: Certified Registered"

## 2023-09-15 ENCOUNTER — Ambulatory Visit (HOSPITAL_COMMUNITY)
Admission: RE | Admit: 2023-09-15 | Discharge: 2023-09-15 | Disposition: A | Discharge status: Home / Self Care | Attending: Internal Medicine | Admitting: Internal Medicine

## 2023-09-15 ENCOUNTER — Encounter (HOSPITAL_COMMUNITY): Payer: Self-pay | Admitting: Internal Medicine

## 2023-09-15 ENCOUNTER — Other Ambulatory Visit: Payer: Self-pay

## 2023-09-15 ENCOUNTER — Encounter (HOSPITAL_COMMUNITY)
Admission: RE | Disposition: A | Discharge status: Home / Self Care | Payer: Self-pay | Source: Home / Self Care | Attending: Internal Medicine

## 2023-09-15 DIAGNOSIS — I2510 Atherosclerotic heart disease of native coronary artery without angina pectoris: Secondary | ICD-10-CM | POA: Diagnosis not present

## 2023-09-15 DIAGNOSIS — F419 Anxiety disorder, unspecified: Secondary | ICD-10-CM | POA: Insufficient documentation

## 2023-09-15 DIAGNOSIS — K6289 Other specified diseases of anus and rectum: Secondary | ICD-10-CM

## 2023-09-15 DIAGNOSIS — F32A Depression, unspecified: Secondary | ICD-10-CM | POA: Diagnosis not present

## 2023-09-15 DIAGNOSIS — K219 Gastro-esophageal reflux disease without esophagitis: Secondary | ICD-10-CM | POA: Diagnosis not present

## 2023-09-15 DIAGNOSIS — D123 Benign neoplasm of transverse colon: Secondary | ICD-10-CM | POA: Insufficient documentation

## 2023-09-15 DIAGNOSIS — M069 Rheumatoid arthritis, unspecified: Secondary | ICD-10-CM | POA: Insufficient documentation

## 2023-09-15 DIAGNOSIS — Z87891 Personal history of nicotine dependence: Secondary | ICD-10-CM | POA: Diagnosis not present

## 2023-09-15 DIAGNOSIS — I252 Old myocardial infarction: Secondary | ICD-10-CM | POA: Diagnosis not present

## 2023-09-15 DIAGNOSIS — G4730 Sleep apnea, unspecified: Secondary | ICD-10-CM | POA: Insufficient documentation

## 2023-09-15 DIAGNOSIS — I255 Ischemic cardiomyopathy: Secondary | ICD-10-CM | POA: Diagnosis not present

## 2023-09-15 DIAGNOSIS — Z1211 Encounter for screening for malignant neoplasm of colon: Secondary | ICD-10-CM | POA: Insufficient documentation

## 2023-09-15 DIAGNOSIS — I10 Essential (primary) hypertension: Secondary | ICD-10-CM | POA: Diagnosis not present

## 2023-09-15 DIAGNOSIS — K5730 Diverticulosis of large intestine without perforation or abscess without bleeding: Secondary | ICD-10-CM | POA: Insufficient documentation

## 2023-09-15 DIAGNOSIS — I25119 Atherosclerotic heart disease of native coronary artery with unspecified angina pectoris: Secondary | ICD-10-CM

## 2023-09-15 DIAGNOSIS — K635 Polyp of colon: Secondary | ICD-10-CM | POA: Diagnosis not present

## 2023-09-15 HISTORY — PX: COLONOSCOPY: SHX5424

## 2023-09-15 SURGERY — COLONOSCOPY
Anesthesia: General

## 2023-09-15 MED ORDER — LACTATED RINGERS IV SOLN: INTRAVENOUS | Status: DC | PRN

## 2023-09-15 MED ORDER — LACTATED RINGERS IV SOLN: INTRAVENOUS | Status: DC

## 2023-09-15 MED ORDER — EPHEDRINE SULFATE-NACL 50-0.9 MG/10ML-% IV SOSY
PREFILLED_SYRINGE | INTRAVENOUS | Status: DC | PRN
  Administered 2023-09-15: 10 mg via INTRAVENOUS
  Administered 2023-09-15: 5 mg via INTRAVENOUS
  Administered 2023-09-15: 10 mg via INTRAVENOUS

## 2023-09-15 MED ORDER — DEXMEDETOMIDINE HCL IN NACL 80 MCG/20ML IV SOLN
INTRAVENOUS | Status: DC | PRN
  Administered 2023-09-15: 6 ug via INTRAVENOUS

## 2023-09-15 MED ORDER — EPHEDRINE SULFATE (PRESSORS) 50 MG/ML IJ SOLN: INTRAMUSCULAR | Status: DC | PRN

## 2023-09-15 MED ORDER — LIDOCAINE 2% (20 MG/ML) 5 ML SYRINGE
INTRAMUSCULAR | Status: DC | PRN
  Administered 2023-09-15: 80 mg via INTRAVENOUS

## 2023-09-15 MED ORDER — PROPOFOL 10 MG/ML IV BOLUS
INTRAVENOUS | Status: DC | PRN
  Administered 2023-09-15: 50 mg via INTRAVENOUS

## 2023-09-15 MED ORDER — PROPOFOL 500 MG/50ML IV EMUL
INTRAVENOUS | Status: DC | PRN
  Administered 2023-09-15: 150 ug/kg/min via INTRAVENOUS

## 2023-09-15 NOTE — Transfer of Care (Signed)
 Immediate Anesthesia Transfer of Care Note  Patient: Richard Simpson  Procedure(s) Performed: COLONOSCOPY  Patient Location: PACU  Anesthesia Type:MAC  Level of Consciousness: awake and alert   Airway & Oxygen Therapy: Patient Spontanous Breathing and Patient connected to nasal cannula oxygen  Post-op Assessment: Report given to RN and Post -op Vital signs reviewed and stable  Post vital signs: Reviewed and stable  Last Vitals:  Vitals Value Taken Time  BP    Temp    Pulse    Resp    SpO2      Last Pain:  Vitals:   09/15/23 1134  TempSrc:   PainSc: 0-No pain      Patients Stated Pain Goal: 8 (09/15/23 1021)  Complications: No notable events documented.

## 2023-09-15 NOTE — Op Note (Signed)
 [facility] Patient Name: Richard Simpson Procedure Date: 09/15/2023 11:14 AM MRN: [id number] Date of Birth: 1970/03/02 Attending MD: Lamar Ozell Hollingshead , MD, [id number] CSN: [id number] Age: 53 Admit Type: Outpatient Procedure:                Colonoscopy Indications:              High risk colon cancer surveillance: Personal                            history of colonic polyps Providers:                Lamar Ozell Hollingshead, MD, Madelin Hunter, RN, Jon Loge Referring MD:              Medicines:                Propofol  per Anesthesia Complications:            No immediate complications. Estimated Blood Loss:     Estimated blood loss was minimal. Procedure:                Pre-Anesthesia Assessment:                           - Prior to the procedure, a History and Physical                            was performed, and patient medications and                            allergies were reviewed. The patient's tolerance of                            previous anesthesia was also reviewed. The risks                            and benefits of the procedure and the sedation                            options and risks were discussed with the patient.                            All questions were answered, and informed consent                            was obtained. Prior Anticoagulants: The patient                            last took Plavix  (clopidogrel ) 1 day prior to the                            procedure. ASA Grade Assessment: III - A patient  with severe systemic disease. After reviewing the                            risks and benefits, the patient was deemed in                            satisfactory condition to undergo the procedure.                           After obtaining informed consent, the colonoscope                            was passed under direct vision. Throughout the                            procedure, the  patient's blood pressure, pulse, and                            oxygen saturations were monitored continuously. The                            [id number] ([id number]) Colon was introduced through                            the anus and advanced to the the cecum, identified                            by appendiceal orifice and ileocecal valve. The                            colonoscopy was performed without difficulty. The                            patient tolerated the procedure well. The quality                            of the bowel preparation was adequate. The                            ileocecal valve, appendiceal orifice, and rectum                            were photographed. Scope In: 11:40:42 AM Scope Out: 11:53:14 AM Scope Withdrawal Time: 0 hours 9 minutes 32 seconds  Total Procedure Duration: 0 hours 12 minutes 32 seconds  Findings:      The perianal and digital rectal examinations were normal.      Multiple medium-mouthed diverticula were found in the sigmoid colon and       descending colon.      A 3 mm polyp was found in the hepatic flexure. The polyp was sessile.       The polyp was removed with a cold snare. Resection and retrieval were       complete. Estimated blood loss was minimal.      The exam was otherwise without abnormality  on direct and retroflexion       views aside from to anal papilla.. Impression:               - Diverticulosis in the sigmoid colon and in the                            descending colon.                           - One 3 mm polyp at the hepatic flexure, removed                            with a cold snare. Resected and retrieved.                           - The examination was otherwise normal on direct                            and retroflexion views (anal papilla). Moderate Sedation:      Moderate (conscious) sedation was personally administered by an       anesthesia professional. The following parameters were monitored: oxygen        saturation, heart rate, blood pressure, respiratory rate, EKG, adequacy       of pulmonary ventilation, and response to care. Recommendation:           - Patient has a contact number available for                            emergencies. The signs and symptoms of potential                            delayed complications were discussed with the                            patient. Return to normal activities tomorrow.                            Written discharge instructions were provided to the                            patient.                           - Resume regular diet.                           - Continue present medications.                           - Repeat colonoscopy date to be determined after                            pending pathology results are reviewed for                            surveillance.                           -  Return to GI office (date not yet determined). Procedure Code(s):        --- Professional ---                           [phone], Colonoscopy, flexible; with removal of                            tumor(s), polyp(s), or other lesion(s) by snare                            technique Diagnosis Code(s):        --- Professional ---                           Z86.010, Personal history of colonic polyps                           D12.3, Benign neoplasm of transverse colon (hepatic                            flexure or splenic flexure)                           K57.30, Diverticulosis of large intestine without                            perforation or abscess without bleeding CPT copyright 2022 American Medical Association. All rights reserved. The codes documented in this report are preliminary and upon coder review may  be revised to meet current compliance requirements. [name] [name], MD Lamar Ozell Hollingshead, MD 09/15/2023 11:58:32 AM This report has been signed electronically. Number of Addenda: 0

## 2023-09-15 NOTE — Interval H&P Note (Signed)
 History and Physical Interval Note:  09/15/2023 11:20 AM  Richard Simpson  has presented today for surgery, with the diagnosis of hx polyps.  The various methods of treatment have been discussed with the patient and family. After consideration of risks, benefits and other options for treatment, the patient has consented to  Procedure(s) with comments: COLONOSCOPY (N/A) - 11:30am, ok rm 1-2 as a surgical intervention.  The patient's history has been reviewed, patient examined, no change in status, stable for surgery.  I have reviewed the patient's chart and labs.  Questions were answered to the patient's satisfaction.     [name]   no change.  Patient is here for surveillance colonoscopy history colonic adenoma gas bloat symptoms not improved with Levsin .  We empirically started a trial of pancreatic enzymes given EUS findings previously.   Significant  improvement in bloating after just 1 week pancreatic enzymes.  I have offered the patient a surveillance colonoscopy today.  The risks, benefits, limitations, alternatives and imponderables have been reviewed with the patient. Questions have been answered. All parties are agreeable.

## 2023-09-15 NOTE — Discharge Instructions (Addendum)
  Colonoscopy Discharge Instructions  Read the instructions outlined below and refer to this sheet in the next few weeks. These discharge instructions provide you with general information on caring for yourself after you leave the hospital. Your doctor may also give you specific instructions. While your treatment has been planned according to the most current medical practices available, unavoidable complications occasionally occur. If you have any problems or questions after discharge, call Dr. Shaaron at [phone]. ACTIVITY You may resume your regular activity, but move at a slower pace for the next 24 hours.  Take frequent rest periods for the next 24 hours.  Walking will help get rid of the air and reduce the bloated feeling in your belly (abdomen).  No driving for 24 hours (because of the medicine (anesthesia) used during the test).   Do not sign any important legal documents or operate any machinery for 24 hours (because of the anesthesia used during the test).  NUTRITION Drink plenty of fluids.  You may resume your normal diet as instructed by your doctor.  Begin with a light meal and progress to your normal diet. Heavy or fried foods are harder to digest and may make you feel sick to your stomach (nauseated).  Avoid alcoholic beverages for 24 hours or as instructed.  MEDICATIONS You may resume your normal medications unless your doctor tells you otherwise.  WHAT YOU CAN EXPECT TODAY Some feelings of bloating in the abdomen.  Passage of more gas than usual.  Spotting of blood in your stool or on the toilet paper.  IF YOU HAD POLYPS REMOVED DURING THE COLONOSCOPY: No aspirin  products for 7 days or as instructed.  No alcohol  for 7 days or as instructed.  Eat a soft diet for the next 24 hours.  FINDING OUT THE RESULTS OF YOUR TEST Not all test results are available during your visit. If your test results are not back during the visit, make an appointment with your caregiver to find out the  results. Do not assume everything is normal if you have not heard from your caregiver or the medical facility. It is important for you to follow up on all of your test results.  SEEK IMMEDIATE MEDICAL ATTENTION IF: You have more than a spotting of blood in your stool.  Your belly is swollen (abdominal distention).  You are nauseated or vomiting.  You have a temperature over 101.  You have abdominal pain or discomfort that is severe or gets worse throughout the day.      1 small polyp found and removed  Information on colon polyps diverticulosis provided   further recommendations to follow pending review of pathology

## 2023-09-15 NOTE — Anesthesia Preprocedure Evaluation (Signed)
 Anesthesia Evaluation  Patient identified by MRN, date of birth, ID band Patient awake    Reviewed: Allergy & Precautions, H&P , NPO status , Patient's Chart, lab work & pertinent test results, reviewed documented beta blocker date and time   Airway Mallampati: II  TM Distance: >3 FB Neck ROM: full    Dental no notable dental hx.    Pulmonary shortness of breath, sleep apnea , former smoker   Pulmonary exam normal breath sounds clear to auscultation       Cardiovascular Exercise Tolerance: Good hypertension, + angina  + CAD and + Past MI   Rhythm:regular Rate:Normal     Neuro/Psych  Headaches PSYCHIATRIC DISORDERS Anxiety Depression       GI/Hepatic Neg liver ROS,GERD  ,,  Endo/Other  negative endocrine ROS    Renal/GU negative Renal ROS  negative genitourinary   Musculoskeletal   Abdominal   Peds  Hematology negative hematology ROS (+)   Anesthesia Other Findings   Reproductive/Obstetrics negative OB ROS                              Anesthesia Physical Anesthesia Plan  ASA: 3  Anesthesia Plan: General   Post-op Pain Management:    Induction:   PONV Risk Score and Plan: Propofol  infusion  Airway Management Planned:   Additional Equipment:   Intra-op Plan:   Post-operative Plan:   Informed Consent: I have reviewed the patients History and Physical, chart, labs and discussed the procedure including the risks, benefits and alternatives for the proposed anesthesia with the patient or authorized representative who has indicated his/her understanding and acceptance.     Dental Advisory Given  Plan Discussed with: CRNA  Anesthesia Plan Comments:         Anesthesia Quick Evaluation

## 2023-09-16 ENCOUNTER — Encounter (HOSPITAL_COMMUNITY): Payer: Self-pay | Admitting: Internal Medicine

## 2023-09-16 LAB — SURGICAL PATHOLOGY

## 2023-09-18 ENCOUNTER — Ambulatory Visit: Payer: Self-pay | Admitting: Internal Medicine

## 2023-09-21 ENCOUNTER — Telehealth: Payer: Self-pay

## 2023-09-21 MED ORDER — PANCRELIPASE (LIP-PROT-AMYL) 36000-114000 UNITS PO CPEP: ORAL_CAPSULE | ORAL | 11 refills | Status: DC

## 2023-09-21 NOTE — Telephone Encounter (Signed)
 Sent to pharmacy on file

## 2023-09-21 NOTE — Telephone Encounter (Signed)
-----   Message from Lamar Hollingshead sent at 09/21/2023  8:14 AM EDT ----- New prescription for Creon  dispensed at 300 take as directed.  2 additional refills.

## 2023-09-22 NOTE — Anesthesia Postprocedure Evaluation (Signed)
 Anesthesia Post Note  Patient: Richard Simpson  Procedure(s) Performed: COLONOSCOPY  Patient location during evaluation: Phase II Anesthesia Type: General Level of consciousness: awake Pain management: pain level controlled Vital Signs Assessment: post-procedure vital signs reviewed and stable Respiratory status: spontaneous breathing and respiratory function stable Cardiovascular status: blood pressure returned to baseline and stable Postop Assessment: no headache and no apparent nausea or vomiting Anesthetic complications: no Comments: Late entry   No notable events documented.   Last Vitals:  Vitals:   09/15/23 1201 09/15/23 1207  BP: 94/63 (!) 93/58  Pulse: 82 75  Resp: 12 13  Temp:    SpO2: 98% 95%    Last Pain:  Vitals:   09/15/23 1207  TempSrc:   PainSc: 0-No pain                 Yvonna JINNY Bosworth

## 2023-10-12 ENCOUNTER — Emergency Department (HOSPITAL_COMMUNITY)
Admission: EM | Admit: 2023-10-12 | Discharge: 2023-10-12 | Disposition: A | Discharge status: Home / Self Care | Attending: Emergency Medicine | Admitting: Emergency Medicine

## 2023-10-12 ENCOUNTER — Emergency Department (HOSPITAL_COMMUNITY)

## 2023-10-12 ENCOUNTER — Other Ambulatory Visit: Payer: Self-pay

## 2023-10-12 ENCOUNTER — Encounter (HOSPITAL_COMMUNITY): Payer: Self-pay

## 2023-10-12 DIAGNOSIS — Z7982 Long term (current) use of aspirin: Secondary | ICD-10-CM | POA: Diagnosis not present

## 2023-10-12 DIAGNOSIS — N201 Calculus of ureter: Secondary | ICD-10-CM

## 2023-10-12 DIAGNOSIS — N132 Hydronephrosis with renal and ureteral calculous obstruction: Secondary | ICD-10-CM | POA: Diagnosis not present

## 2023-10-12 DIAGNOSIS — R109 Unspecified abdominal pain: Secondary | ICD-10-CM | POA: Diagnosis not present

## 2023-10-12 DIAGNOSIS — Z7902 Long term (current) use of antithrombotics/antiplatelets: Secondary | ICD-10-CM | POA: Insufficient documentation

## 2023-10-12 DIAGNOSIS — Z9049 Acquired absence of other specified parts of digestive tract: Secondary | ICD-10-CM | POA: Diagnosis not present

## 2023-10-12 DIAGNOSIS — I509 Heart failure, unspecified: Secondary | ICD-10-CM | POA: Insufficient documentation

## 2023-10-12 DIAGNOSIS — Z79899 Other long term (current) drug therapy: Secondary | ICD-10-CM | POA: Diagnosis not present

## 2023-10-12 DIAGNOSIS — I110 Hypertensive heart disease with heart failure: Secondary | ICD-10-CM | POA: Insufficient documentation

## 2023-10-12 DIAGNOSIS — R1031 Right lower quadrant pain: Secondary | ICD-10-CM | POA: Diagnosis present

## 2023-10-12 LAB — CBC
HCT: 44.6 % (ref 39.0–52.0)
Hemoglobin: 15.2 g/dL (ref 13.0–17.0)
MCH: 33.6 pg (ref 26.0–34.0)
MCHC: 34.1 g/dL (ref 30.0–36.0)
MCV: 98.7 fL (ref 80.0–100.0)
Platelets: 154 K/uL (ref 150–400)
RBC: 4.52 MIL/uL (ref 4.22–5.81)
RDW: 12.9 % (ref 11.5–15.5)
WBC: 7.8 K/uL (ref 4.0–10.5)
nRBC: 0 % (ref 0.0–0.2)

## 2023-10-12 LAB — URINALYSIS, ROUTINE W REFLEX MICROSCOPIC
Bilirubin Urine: NEGATIVE
Glucose, UA: NEGATIVE mg/dL
Ketones, ur: NEGATIVE mg/dL
Leukocytes,Ua: NEGATIVE
Nitrite: NEGATIVE
Protein, ur: 100 mg/dL — AB
RBC / HPF: >50 RBC/hpf (ref 0–5)
Specific Gravity, Urine: 1.023 (ref 1.005–1.030)
pH: 5 (ref 5.0–8.0)

## 2023-10-12 LAB — BASIC METABOLIC PANEL WITH GFR
Anion gap: 12 (ref 5–15)
BUN: 18 mg/dL (ref 6–20)
CO2: 23 mmol/L (ref 22–32)
Calcium: 9 mg/dL (ref 8.9–10.3)
Chloride: 106 mmol/L (ref 98–111)
Creatinine, Ser: 1.12 mg/dL (ref 0.61–1.24)
GFR, Estimated: >60 mL/min (ref >60)
Glucose, Bld: 136 mg/dL — ABNORMAL HIGH (ref 70–99)
Potassium: 4.1 mmol/L (ref 3.5–5.1)
Sodium: 141 mmol/L (ref 135–145)

## 2023-10-12 MED ORDER — CEPHALEXIN 500 MG PO CAPS: 500.0000 mg | ORAL_CAPSULE | Freq: Four times a day (QID) | ORAL | 0 refills | Status: DC

## 2023-10-12 MED ORDER — OXYCODONE-ACETAMINOPHEN 5-325 MG PO TABS: 1.0000 | ORAL_TABLET | ORAL | 0 refills | Status: DC | PRN

## 2023-10-12 MED ORDER — TAMSULOSIN HCL 0.4 MG PO CAPS: 0.4000 mg | ORAL_CAPSULE | Freq: Every day | ORAL | 0 refills | Status: DC

## 2023-10-12 MED ORDER — KETOROLAC TROMETHAMINE 30 MG/ML IJ SOLN
15.0000 mg | Freq: Once | INTRAMUSCULAR | Status: AC
  Administered 2023-10-12: 15 mg via INTRAVENOUS
  Filled 2023-10-12: qty 1

## 2023-10-12 MED ORDER — HYDROMORPHONE HCL 1 MG/ML IJ SOLN
1.0000 mg | Freq: Once | INTRAMUSCULAR | Status: AC
  Administered 2023-10-12: 1 mg via INTRAVENOUS
  Filled 2023-10-12: qty 1

## 2023-10-12 MED ORDER — SODIUM CHLORIDE 0.9 % IV BOLUS
500.0000 mL | Freq: Once | INTRAVENOUS | Status: AC
  Administered 2023-10-12: 500 mL via INTRAVENOUS

## 2023-10-12 MED ORDER — CEPHALEXIN 500 MG PO CAPS
500.0000 mg | ORAL_CAPSULE | Freq: Once | ORAL | Status: AC
  Administered 2023-10-12: 500 mg via ORAL
  Filled 2023-10-12: qty 1

## 2023-10-12 MED ORDER — ONDANSETRON HCL 4 MG/2ML IJ SOLN
4.0000 mg | Freq: Once | INTRAMUSCULAR | Status: AC
  Administered 2023-10-12: 4 mg via INTRAVENOUS
  Filled 2023-10-12: qty 2

## 2023-10-12 MED ORDER — ONDANSETRON HCL 4 MG PO TABS: 4.0000 mg | ORAL_TABLET | Freq: Four times a day (QID) | ORAL | 0 refills | Status: AC

## 2023-10-12 NOTE — Discharge Instructions (Signed)
 Your workup today shows that you have a 6 mm stone on the right side.  Please take the medication as directed.  Strain all urine.  Call the urology provider listed to arrange follow-up appointment.  Return to the emergency department for any new or worsening symptoms.

## 2023-10-12 NOTE — ED Triage Notes (Signed)
 Pt arrives ambulatory to ED with c/o pain to right flank reports history of Kidneys stones states that this feels similar, pain started about 2 hours ago. Also endorses some nausea.

## 2023-10-13 DIAGNOSIS — N202 Calculus of kidney with calculus of ureter: Secondary | ICD-10-CM | POA: Diagnosis not present

## 2023-10-13 LAB — URINE CULTURE: Culture: NO GROWTH

## 2023-10-16 NOTE — ED Provider Notes (Signed)
 [place] EMERGENCY DEPARTMENT AT [facility] Provider Note   CSN: [id number] Arrival date & time: 10/12/23  9179     Patient presents with: Flank Pain   Richard Simpson is a 53 y.o. male.    Flank Pain Associated symptoms include abdominal pain. Pertinent negatives include no chest pain and no shortness of breath.       Richard Simpson is a 53 y.o. male past medical history of hypertension, cardiac disease, CHF, prior kidney stones who presents to the Emergency Department complaining of sudden onset right flank and right lower quadrant pain.  Pain began shortly before ER arrival.  Has been associated with some nausea.  He feels as though pain originates in his right flank area and radiates to his lower his right groin area.  States his urine has been very dark in color but denies having any burning with urination or gross hematuria.  No fever or chills.  Pain has not been associated with food intake.  He states the pain feels similar to previous kidney stones.  Prior to Admission medications   Medication Sig Start Date End Date Taking? Authorizing Provider  cephALEXin  (KEFLEX ) 500 MG capsule Take 1 capsule (500 mg total) by mouth 4 (four) times daily. 10/12/23  Yes Kenlee Maler, PA-C  ondansetron  (ZOFRAN ) 4 MG tablet Take 1 tablet (4 mg total) by mouth every 6 (six) hours. 10/12/23  Yes Zafir Schauer, PA-C  oxyCODONE -acetaminophen  (PERCOCET /ROXICET) 5-325 MG tablet Take 1 tablet by mouth every 4 (four) hours as needed. 10/12/23  Yes Leafy Motsinger, PA-C  tamsulosin  (FLOMAX ) 0.4 MG CAPS capsule Take 1 capsule (0.4 mg total) by mouth daily. 10/12/23  Yes Talissa Apple, PA-C  amLODipine  (NORVASC ) 5 MG tablet Take 1 tablet (5 mg total) by mouth daily. 01/03/23   Burnard Debby LABOR, MD  aspirin  EC 81 MG tablet Take 1 tablet (81 mg total) by mouth daily. 12/04/13   Army Katheryn BROCKS, NP  atorvastatin  (LIPITOR ) 80 MG tablet Take 1 tablet (80 mg total) by mouth daily. 11/16/22    Burnard Debby LABOR, MD  clonazePAM  (KLONOPIN ) 1 MG tablet Take 1 mg by mouth 3 (three) times daily as needed for anxiety. Take 1mg  tablet by mouth scheduled at bedtime, may take an additional dose if needed for anxiety    [provider]  clopidogrel  (PLAVIX ) 75 MG tablet Take 1 tablet (75 mg total) by mouth daily. 11/16/22   Burnard Debby LABOR, MD  ezetimibe  (ZETIA ) 10 MG tablet Take 1 tablet (10 mg total) by mouth daily. 11/16/22   Burnard Debby LABOR, MD  furosemide  (LASIX ) 20 MG tablet Take 1 tablet (20 mg total) by mouth daily. 11/16/22   Burnard Debby LABOR, MD  hyoscyamine  (LEVSIN  SL) 0.125 MG SL tablet Place 1 tablet (0.125 mg total) under the tongue 3 (three) times daily with meals as needed. 08/16/23   Shaaron Lamar HERO, MD  lipase/protease/amylase (CREON ) 36000 UNITS CPEP capsule Take 2 capsules with meals and 1 capsule with snacks (up to 2 snacks) 09/21/23   [name], Lamar HERO, MD  loratadine  (CLARITIN ) 10 MG tablet Take 10 mg by mouth daily.     [provider]  losartan  (COZAAR ) 25 MG tablet Take 0.5 tablets (12.5 mg total) by mouth daily. Take in the evening 11/16/22   Burnard Debby LABOR, MD  metoprolol  tartrate (LOPRESSOR ) 25 MG tablet Take 1.5 tablets (37.5 mg total) by mouth 2 (two) times daily. 11/16/22   Burnard Debby LABOR, MD  mycophenolate (  CELLCEPT) 500 MG tablet Take 1,000 mg by mouth 2 (two) times daily. Takes 2 tablets in the morning and 2 tablets in the evening (2,000 mg total) 04/28/17   [provider]  nitroGLYCERIN  (NITROSTAT ) 0.4 MG SL tablet DISSOLVE 1 TABLET UNDER TONGUE FOR CHEST TIGHTNESS AND SHORTNESS OF BREATH. MAY TAKE EVERY 5 MINUTES UP TO 3 DOSES. IF NO RELIEF AFTER 3 DOS 12/10/20   Burnard Debby LABOR, MD  nortriptyline  (PAMELOR ) 10 MG capsule Take 1 capsule (10 mg total) by mouth at bedtime. 05/19/21   [name], Lamar HERO, MD  pantoprazole  (PROTONIX ) 40 MG tablet Take 1 tablet (40 mg total) by mouth daily. 08/16/23   [name], Lamar HERO, MD    Allergies: Lisinopril  and  Prednisone     Review of Systems  Constitutional:  Negative for appetite change, chills and fever.  Respiratory:  Negative for shortness of breath.   Cardiovascular:  Negative for chest pain.  Gastrointestinal:  Positive for abdominal pain and nausea. Negative for diarrhea and vomiting.  Genitourinary:  Positive for flank pain. Negative for hematuria.       Radiating pain to right groin  Neurological:  Negative for dizziness and weakness.    Updated Vital Signs BP 95/66 (BP Location: Right Arm)   Pulse 63   Temp (!) 97.5 F (36.4 C) (Oral)   Resp 14   Ht 6' 1 (1.854 m)   Wt 115.7 kg   SpO2 95%   BMI 33.64 kg/m   Physical Exam Vitals and nursing note reviewed.  Constitutional:      Comments: Patient appears uncomfortable  HENT:     Mouth/Throat:     Mouth: Mucous membranes are moist.  Cardiovascular:     Rate and Rhythm: Normal rate and regular rhythm.  Pulmonary:     Effort: Pulmonary effort is normal.  Abdominal:     Palpations: Abdomen is soft.     Tenderness: There is no abdominal tenderness. There is no right CVA tenderness or left CVA tenderness.  Musculoskeletal:        General: Normal range of motion.  Skin:    General: Skin is warm.     Capillary Refill: Capillary refill takes less than 2 seconds.     Findings: No rash.  Neurological:     General: No focal deficit present.     Sensory: No sensory deficit.     Motor: No weakness.     (all labs ordered are listed, but only abnormal results are displayed) Labs Reviewed  URINALYSIS, ROUTINE W REFLEX MICROSCOPIC - Abnormal; Notable for the following components:      Result Value   Color, Urine AMBER (*)    APPearance CLOUDY (*)    Hgb urine dipstick LARGE (*)    Protein, ur 100 (*)    Bacteria, UA RARE (*)    All other components within normal limits  BASIC METABOLIC PANEL WITH GFR - Abnormal; Notable for the following components:   Glucose, Bld 136 (*)    All other components within normal limits   URINE CULTURE  CBC    EKG: None  Radiology: No results found.CT ABDOMEN PELVIS WO CONTRAST Result Date: 10/12/2023 CLINICAL DATA:  Acute right flank pain. EXAM: CT ABDOMEN AND PELVIS WITHOUT CONTRAST TECHNIQUE: Multidetector CT imaging of the abdomen and pelvis was performed following the standard protocol without IV contrast. RADIATION DOSE REDUCTION: This exam was performed according to the departmental dose-optimization program which includes automated exposure control, adjustment of the mA and/or kV  according to patient size and/or use of iterative reconstruction technique. COMPARISON:  Jun 23, 2011. FINDINGS: Lower chest: No acute abnormality. Hepatobiliary: No focal liver abnormality is seen. Status post cholecystectomy. No biliary dilatation. Pancreas: Unremarkable. No pancreatic ductal dilatation or surrounding inflammatory changes. Spleen: Normal in size without focal abnormality. Adrenals/Urinary Tract: Adrenal glands appear normal. Bilateral nephrolithiasis is noted. Minimal right hydronephrosis is noted secondary to 6 mm calculus in proximal right ureter. Urinary bladder is unremarkable. Stomach/Bowel: Stomach is within normal limits. Appendix appears normal. No evidence of bowel wall thickening, distention, or inflammatory changes. Vascular/Lymphatic: Aortic atherosclerosis. No enlarged abdominal or pelvic lymph nodes. Reproductive: Prostate is unremarkable. Other: No abdominal wall hernia or abnormality. No abdominopelvic ascites. Musculoskeletal: No acute or significant osseous findings. IMPRESSION: 1. Bilateral nephrolithiasis. Minimal right hydronephrosis is noted secondary to 6 mm calculus in proximal right ureter. 2. Aortic atherosclerosis. Aortic Atherosclerosis (ICD10-I70.0). Electronically Signed   By: Lynwood Landy Raddle M.D.   On: 10/12/2023 10:07      Procedures   Medications Ordered in the ED  HYDROmorphone  (DILAUDID ) injection 1 mg (1 mg Intravenous Given 10/12/23 0938)   ondansetron  (ZOFRAN ) injection 4 mg (4 mg Intravenous Given 10/12/23 0937)  ketorolac  (TORADOL ) 30 MG/ML injection 15 mg (15 mg Intravenous Given 10/12/23 0942)  sodium chloride  0.9 % bolus 500 mL (0 mLs Intravenous Stopped 10/12/23 1032)  cephALEXin  (KEFLEX ) capsule 500 mg (500 mg Oral Given 10/12/23 1056)                                    Medical Decision Making Patient here with sudden onset right flank and right lower quadrant pain that radiates to his right groin.  Has history of kidney stones and states he is current pain feels similar.  He also endorses having some dark-colored urine recently.  Denies any fever or decreased appetite.   Differential includes but not limited to kidney stone, infected kidney stone, pyelonephritis, acute appendicitis, AKI, musculoskeletal pain  Amount and/or Complexity of Data Reviewed Labs: ordered.    Details: Labs no evidence of leukocytosis, chemistries are reassuring.  Urinalysis shows large amount of blood with rare bacteria and 21-50 white cells negative leukocytes.  Urine culture is pending Radiology: ordered.    Details: CT renal stone study shows bilateral nephrolithiasis with right hydronephrosis secondary to a 6 mm calculus in the proximal ureter.  Appendix is normal appearing Discussion of management or test interpretation with external provider(s): Workup today shows 6 mm kidney stone with some mild hydronephrosis.  Possible urinary infection as well.  Urine culture is pending.  He is feeling better after pain medication and antiemetic and Toradol .  Blood pressures were little soft after receiving pain medication so small amount IV fluids were given.  Blood pressure improved.  Patient reporting minimal pain at this time.  Appropriate for discharge home given follow-up information for local urology, will treat with antibiotics, Flomax  antiemetic and short course of pain medication he will strain his urine at home.  Appears appropriate for discharge  home.  Strict return precautions were given  Risk Prescription drug management.        Final diagnoses:  Ureterolithiasis    ED Discharge Orders          Ordered    ondansetron  (ZOFRAN ) 4 MG tablet  Every 6 hours        10/12/23 1105    oxyCODONE -acetaminophen  (PERCOCET /ROXICET) 5-325 MG tablet  Every 4 hours PRN        10/12/23 1105    tamsulosin  (FLOMAX ) 0.4 MG CAPS capsule  Daily        10/12/23 1105    cephALEXin  (KEFLEX ) 500 MG capsule  4 times daily        10/12/23 1105               Herlinda Milling, PA-C 10/16/23 1327    Suzette Pac, MD 10/19/23 1113

## 2023-10-17 ENCOUNTER — Ambulatory Visit: Attending: Nurse Practitioner | Admitting: Nurse Practitioner

## 2023-10-17 ENCOUNTER — Encounter: Payer: Self-pay | Admitting: Nurse Practitioner

## 2023-10-17 ENCOUNTER — Telehealth: Payer: Self-pay | Admitting: Physician Assistant

## 2023-10-17 VITALS — BP 110/74 | HR 60 | Ht 73.0 in | Wt 262.4 lb

## 2023-10-17 DIAGNOSIS — I255 Ischemic cardiomyopathy: Secondary | ICD-10-CM

## 2023-10-17 DIAGNOSIS — I2510 Atherosclerotic heart disease of native coronary artery without angina pectoris: Secondary | ICD-10-CM | POA: Diagnosis not present

## 2023-10-17 DIAGNOSIS — E785 Hyperlipidemia, unspecified: Secondary | ICD-10-CM | POA: Diagnosis not present

## 2023-10-17 DIAGNOSIS — I10 Essential (primary) hypertension: Secondary | ICD-10-CM | POA: Diagnosis not present

## 2023-10-17 DIAGNOSIS — M7989 Other specified soft tissue disorders: Secondary | ICD-10-CM

## 2023-10-17 NOTE — Progress Notes (Unsigned)
 Cardiology Office Note   Date:  10/17/2023 ID:  Richard Simpson, DOB 08-08-1970, MRN [id number] PCP: [facility]  [facility] Providers Cardiologist:  Diannah SHAUNNA Maywood, MD     History of Present Illness Richard Simpson is a 53 y.o. male with a PMH of CAD, s/p DES to LAD 06/2014, dyslipidemia, HTN, ICM, hx of kidney stones, RA, ILD, sleep apnea, obesity, who presents today for feet/lower extremity swelling evaluation.   Last seen by Dr. Debby Sor on 11/16/2022. Was experiencing rare chest fluttering.  Was overall doing well at the time.  Today he presents for leg/feet swelling evaluation with his wife. He states noticed significant leg/feet swelling his his lower extremities and ankles after his diagnosis of recent kidney stones. Denies any recent increased salt intake, feet have been throbbing and has been keeping his feet elevated in his recliner yesterday. Wife has said that this was new for him and was very startling to see. Does have history of RA. Has been taking Lasix  PRN for swelling. Denies any chest pain, shortness of breath, palpitations, syncope, presyncope, dizziness, orthopnea, PND, significant weight changes, acute bleeding, or claudication.   ROS: Negative. See HPI.  SH: Retired Emergency planning/management officer.   Studies Reviewed  EKG:  EKG Interpretation Date/Time:  Monday October 17 2023 15:16:52 EDT Ventricular Rate:  67 PR Interval:  130 QRS Duration:  84 QT Interval:  426 QTC Calculation: 450 R Axis:   -8  Text Interpretation: Normal sinus rhythm Normal ECG When compared with ECG of 16-Nov-2022 12:41, Nonspecific T wave abnormality now evident in Lateral leads Confirmed by Miriam Norris [phone]) on 10/17/2023 3:21:05 PM   Lexiscan  07/2020:  Nuclear stress EF: 53%. Visually, the ejection fraction appears to be greater than the number calculated by the computer. LV function appears to be normal. This is a low risk study. There is no evidence of ischemia  and no evidence of previous infarction. The study is normal.  Right/left heart cath 03/2019:  Previously placed Ost LAD to Prox LAD stent (unknown type) is widely patent. Prox LAD to Mid LAD lesion is 35% stenosed. LV end diastolic pressure is normal. LV end diastolic pressure is normal.   1. Nonobstructive CAD. Prior stent in the proximal LAD is widely patent 2. Normal LV filling pressures 3. Normal right heart pressures 4. Normal cardiac output   Plan: based on these findings I suspect his symptoms are more related to his pulmonary disease. Continue medical management.   Echo 02/2019:  1. Left ventricular ejection fraction, by estimation, is 65 to 70%. The  left ventricle has normal function. The left ventrical is not well  visualized to evaluate regional wall motion. There is mildly increased  left ventricular hypertrophy. Left  ventricular diastolic parameters are consistent with Grade I diastolic  dysfunction (impaired relaxation.)   2. Right ventricular systolic function is normal. The right ventricular  size is normal. There is normal pulmonary artery systolic pressure.   3. The mitral valve is abnormal. trivial mitral valve regurgitation. No  evidence of mitral stenosis.   4. The aortic valve is tricuspid. Aortic valve regurgitation is trivial .  No aortic stenosis is present.   5. The inferior vena cava is normal in size with greater than 50%  respiratory variability, suggesting right atrial pressure of 3 mmHg.   Comparison(s): A prior study was performed on 01/06/2017. Prior images  unable to be directly viewed, comparison made by report only. Comparison  of reports suggests improved  LV function. Unable to assess regional wall  motion, however LVEF appears  hyperdynamic today.  Physical Exam VS:  BP 110/74 (BP Location: Right Arm, Cuff Size: Large)   Pulse 60   Ht 6' 1 (1.854 m)   Wt 262 lb 6.4 oz (119 kg)   SpO2 93%   BMI 34.62 kg/m        Wt Readings from  Last 3 Encounters:  10/17/23 262 lb 6.4 oz (119 kg)  10/12/23 255 lb (115.7 kg)  09/15/23 250 lb (113.4 kg)    GEN: Obese, 53 y.o. male in no acute distress NECK: No JVD; No carotid bruits CARDIAC: S1/S2, RRR, no murmurs, rubs, gallops RESPIRATORY:  Clear to auscultation without rales, wheezing or rhonchi  ABDOMEN: Soft, non-tender, non-distended EXTREMITIES:  Trace edema to BLE, hard to detect; No deformity   ASSESSMENT AND PLAN  CAD Stable with no anginal symptoms. No indication for ischemic evaluation. Most recent right and left heart cath showed nonobstructive CAD, widely patent stent to pLAD.  Continue aspirin , atorvastatin , Plavix , Zetia , losartan , and Lopressor . Heart healthy diet and regular cardiovascular exercise encouraged.    ICM Stage C, NYHA class I-II symptoms. EF 65-70%. Euvolemic and well compensated on exam. Continue Lasix , losartan , and Lopressor . Will update Echo at this time - see below. Low sodium diet, fluid restriction <2L, and daily weights encouraged. Educated to contact our office for weight gain of 2 lbs overnight or 5 lbs in one week.  HTN BP stable. Discussed to monitor BP at home at least 2 hours after medications and sitting for 5-10 minutes. No medication changes at this time. Heart healthy diet and regular cardiovascular exercise encouraged.   Dyslipidemia LDL from 1 year 64. Currently at goal. Continue Lipitor  and Zetia . Heart healthy diet and regular cardiovascular exercise encouraged. Plan to repeat FLP and LFT at next office visit if not obtained by PCP at next office visit.   Lower extremity edema Etiology most likely due to chronic venous insufficiency and also in setting of kidney stones. Will arrange Echo and lowe extremity venous reflex study. Continue Lasix  PRN. Low salt, heart healthy diet and regular cardiovascular exercise encouraged.       Dispo: Follow-up with MD/APP in 4-6 weeks or sooner if anything changes.   Signed, Almarie Crate, NP

## 2023-10-17 NOTE — Telephone Encounter (Signed)
 Woke this Saturday morning 10/15/23 to bi-lat feet and ankle swelling.Took crocks off and could not get shoes back on.Takes lasix  20 mg intermittently (about once a week) and says he can usually tell when he needs lasix  by his breathing. He has No SOB, does not weigh self and did not take lasix  Saturday. He reports taking lasix  yesterday and has urinated a lot. Of note, he was seen in the ED on 10/12/23 for ureterolithiasis and was given a lot of IV fluids. Offered and accepted appointment today at 3 pm with E.Peck,NP

## 2023-10-17 NOTE — Telephone Encounter (Signed)
 Pt c/o swelling: STAT is pt has developed SOB within 24 hours  How much weight have you gained and in what time span?  Has not gained weight   If swelling, where is the swelling located?  Feet, ankles, legs  Are you currently taking a fluid pill?  Yes   Are you currently SOB?  No  Do you have a log of your daily weights (if so, list)?   Have you gained 3 pounds in a day or 5 pounds in a week?   Have you traveled recently?  No

## 2023-10-17 NOTE — Patient Instructions (Addendum)
 Medication Instructions:  Your physician recommends that you continue on your current medications as directed. Please refer to the Current Medication list given to you today.  Labwork: None   Testing/Procedures: Your physician has requested that you have an echocardiogram. Echocardiography is a painless test that uses sound waves to create images of your heart. It provides your doctor with information about the size and shape of your heart and how well your heart's chambers and valves are working. This procedure takes approximately one hour. There are no restrictions for this procedure. Please do NOT wear cologne, perfume, aftershave, or lotions (deodorant is allowed). Please arrive 15 minutes prior to your appointment time.  Please note: We ask at that you not bring children with you during ultrasound (echo/ vascular) testing. Due to room size and safety concerns, children are not allowed in the ultrasound rooms during exams. Our front office staff cannot provide observation of children in our lobby area while testing is being conducted. An adult accompanying a patient to their appointment will only be allowed in the ultrasound room at the discretion of the ultrasound technician under special circumstances. We apologize for any inconvenience. Your physician has requested that you have a lower or upper extremity venous duplex. This test is an ultrasound of the veins in the legs or arms. It looks at venous blood flow that carries blood from the heart to the legs or arms. Allow one hour for a Lower Venous exam. Allow thirty minutes for an Upper Venous exam. There are no restrictions or special instructions.  Please note: We ask at that you not bring children with you during ultrasound (echo/ vascular) testing. Due to room size and safety concerns, children are not allowed in the ultrasound rooms during exams. Our front office staff cannot provide observation of children in our lobby area while testing is  being conducted. An adult accompanying a patient to their appointment will only be allowed in the ultrasound room at the discretion of the ultrasound technician under special circumstances. We apologize for any inconvenience.  Follow-Up: Your physician recommends that you schedule a follow-up appointment in: 4-6 weeks   Any Other Special Instructions Will Be Listed Below (If Applicable).  If you need a refill on your cardiac medications before your next appointment, please call your pharmacy.

## 2023-10-24 DIAGNOSIS — R8271 Bacteriuria: Secondary | ICD-10-CM | POA: Diagnosis not present

## 2023-10-24 DIAGNOSIS — N202 Calculus of kidney with calculus of ureter: Secondary | ICD-10-CM | POA: Diagnosis not present

## 2023-10-27 ENCOUNTER — Ambulatory Visit

## 2023-10-27 ENCOUNTER — Ambulatory Visit: Attending: Nurse Practitioner

## 2023-10-27 DIAGNOSIS — M7989 Other specified soft tissue disorders: Secondary | ICD-10-CM

## 2023-10-27 DIAGNOSIS — I255 Ischemic cardiomyopathy: Secondary | ICD-10-CM | POA: Diagnosis not present

## 2023-10-28 LAB — ECHOCARDIOGRAM COMPLETE
AR max vel: 3.55 cm2
AV Peak grad: 9.2 mmHg
Ao pk vel: 1.52 m/s
Area-P 1/2: 2.97 cm2
Calc EF: 67.6 %
P 1/2 time: 1162 ms
S' Lateral: 3 cm
Single Plane A2C EF: 62.1 %
Single Plane A4C EF: 67.7 %

## 2023-11-01 ENCOUNTER — Ambulatory Visit: Payer: Self-pay | Admitting: Nurse Practitioner

## 2023-11-08 DIAGNOSIS — Z23 Encounter for immunization: Secondary | ICD-10-CM | POA: Diagnosis not present

## 2023-11-11 DIAGNOSIS — M25511 Pain in right shoulder: Secondary | ICD-10-CM | POA: Diagnosis not present

## 2023-11-11 DIAGNOSIS — Z6834 Body mass index (BMI) 34.0-34.9, adult: Secondary | ICD-10-CM | POA: Diagnosis not present

## 2023-11-14 ENCOUNTER — Ambulatory Visit: Admitting: Nurse Practitioner

## 2023-11-16 DIAGNOSIS — M7541 Impingement syndrome of right shoulder: Secondary | ICD-10-CM | POA: Diagnosis not present

## 2023-11-16 DIAGNOSIS — M7542 Impingement syndrome of left shoulder: Secondary | ICD-10-CM | POA: Diagnosis not present

## 2023-11-16 DIAGNOSIS — G8929 Other chronic pain: Secondary | ICD-10-CM | POA: Diagnosis not present

## 2023-11-16 DIAGNOSIS — G4733 Obstructive sleep apnea (adult) (pediatric): Secondary | ICD-10-CM | POA: Diagnosis not present

## 2023-11-16 DIAGNOSIS — M25511 Pain in right shoulder: Secondary | ICD-10-CM | POA: Diagnosis not present

## 2023-11-16 DIAGNOSIS — M25512 Pain in left shoulder: Secondary | ICD-10-CM | POA: Diagnosis not present

## 2023-11-17 ENCOUNTER — Encounter: Payer: Self-pay | Admitting: Nurse Practitioner

## 2023-11-17 ENCOUNTER — Ambulatory Visit: Attending: Nurse Practitioner | Admitting: Nurse Practitioner

## 2023-11-17 VITALS — BP 108/66 | HR 64 | Ht 73.0 in | Wt 261.0 lb

## 2023-11-17 DIAGNOSIS — I5032 Chronic diastolic (congestive) heart failure: Secondary | ICD-10-CM

## 2023-11-17 DIAGNOSIS — M7989 Other specified soft tissue disorders: Secondary | ICD-10-CM

## 2023-11-17 DIAGNOSIS — I255 Ischemic cardiomyopathy: Secondary | ICD-10-CM | POA: Diagnosis not present

## 2023-11-17 DIAGNOSIS — I10 Essential (primary) hypertension: Secondary | ICD-10-CM

## 2023-11-17 DIAGNOSIS — E785 Hyperlipidemia, unspecified: Secondary | ICD-10-CM

## 2023-11-17 DIAGNOSIS — I872 Venous insufficiency (chronic) (peripheral): Secondary | ICD-10-CM

## 2023-11-17 DIAGNOSIS — I2510 Atherosclerotic heart disease of native coronary artery without angina pectoris: Secondary | ICD-10-CM | POA: Diagnosis not present

## 2023-11-17 NOTE — Patient Instructions (Addendum)
 Medication Instructions:  Your physician recommends that you continue on your current medications as directed. Please refer to the Current Medication list given to you today.  Labwork: In 1-2 weeks at [facility]   Testing/Procedures: None   Follow-Up: Your physician recommends that you schedule a follow-up appointment in: 1 Year with Dr.Mallipeddi   Any Other Special Instructions Will Be Listed Below (If Applicable).  If you need a refill on your cardiac medications before your next appointment, please call your pharmacy.

## 2023-11-17 NOTE — Progress Notes (Unsigned)
 Cardiology Office Note   Date:  10/17/2023 ID:  Richard Simpson, DOB 18-Feb-1970, MRN [id number] PCP: [facility] Providers Cardiologist:  Diannah SHAUNNA Maywood, MD     History of Present Illness Richard Simpson is a 53 y.o. male with a PMH of CAD, s/p DES to LAD 06/2014, dyslipidemia, HTN, ICM, hx of kidney stones, RA, ILD, sleep apnea, obesity, who presents today for feet/lower extremity swelling evaluation.   Last seen by Dr. Debby Sor on 11/16/2022. Was experiencing rare chest fluttering.  Was overall doing well at the time.  Today he presents for leg/feet swelling evaluation with his wife. He states noticed significant leg/feet swelling his his lower extremities and ankles after his diagnosis of recent kidney stones. Denies any recent increased salt intake, feet have been throbbing and has been keeping his feet elevated in his recliner yesterday. Wife has said that this was new for him and was very startling to see. Does have history of RA. Has been taking Lasix  PRN for swelling. Denies any chest pain, shortness of breath, palpitations, syncope, presyncope, dizziness, orthopnea, PND, significant weight changes, acute bleeding, or claudication.   ROS: Negative. See HPI.  SH: Retired Emergency planning/management officer.   Studies Reviewed  EKG:      Lexiscan  07/2020:  Nuclear stress EF: 53%. Visually, the ejection fraction appears to be greater than the number calculated by the computer. LV function appears to be normal. This is a low risk study. There is no evidence of ischemia and no evidence of previous infarction. The study is normal.  Right/left heart cath 03/2019:  Previously placed Ost LAD to Prox LAD stent (unknown type) is widely patent. Prox LAD to Mid LAD lesion is 35% stenosed. LV end diastolic pressure is normal. LV end diastolic pressure is normal.   1. Nonobstructive CAD. Prior stent in the proximal LAD is widely patent 2. Normal LV filling  pressures 3. Normal right heart pressures 4. Normal cardiac output   Plan: based on these findings I suspect his symptoms are more related to his pulmonary disease. Continue medical management.   Echo 02/2019:  1. Left ventricular ejection fraction, by estimation, is 65 to 70%. The  left ventricle has normal function. The left ventrical is not well  visualized to evaluate regional wall motion. There is mildly increased  left ventricular hypertrophy. Left  ventricular diastolic parameters are consistent with Grade I diastolic  dysfunction (impaired relaxation.)   2. Right ventricular systolic function is normal. The right ventricular  size is normal. There is normal pulmonary artery systolic pressure.   3. The mitral valve is abnormal. trivial mitral valve regurgitation. No  evidence of mitral stenosis.   4. The aortic valve is tricuspid. Aortic valve regurgitation is trivial .  No aortic stenosis is present.   5. The inferior vena cava is normal in size with greater than 50%  respiratory variability, suggesting right atrial pressure of 3 mmHg.   Comparison(s): A prior study was performed on 01/06/2017. Prior images  unable to be directly viewed, comparison made by report only. Comparison  of reports suggests improved LV function. Unable to assess regional wall  motion, however LVEF appears  hyperdynamic today.  Physical Exam VS:  BP 108/66   Pulse 64   Ht 6' 1 (1.854 m)   Wt 261 lb (118.4 kg)   SpO2 97%   BMI 34.43 kg/m        Wt Readings from Last 3 Encounters:  11/17/23 261  lb (118.4 kg)  10/17/23 262 lb 6.4 oz (119 kg)  10/12/23 255 lb (115.7 kg)    GEN: Obese, 53 y.o. male in no acute distress NECK: No JVD; No carotid bruits CARDIAC: S1/S2, RRR, no murmurs, rubs, gallops RESPIRATORY:  Clear to auscultation without rales, wheezing or rhonchi  ABDOMEN: Soft, non-tender, non-distended EXTREMITIES:  Trace edema to BLE, hard to detect; No deformity   ASSESSMENT AND  PLAN  CAD Stable with no anginal symptoms. No indication for ischemic evaluation. Most recent right and left heart cath showed nonobstructive CAD, widely patent stent to pLAD.  Continue aspirin , atorvastatin , Plavix , Zetia , losartan , and Lopressor . Heart healthy diet and regular cardiovascular exercise encouraged.    ICM Stage C, NYHA class I-II symptoms. EF 65-70%. Euvolemic and well compensated on exam. Continue Lasix , losartan , and Lopressor . Will update Echo at this time - see below. Low sodium diet, fluid restriction <2L, and daily weights encouraged. Educated to contact our office for weight gain of 2 lbs overnight or 5 lbs in one week.  HTN BP stable. Discussed to monitor BP at home at least 2 hours after medications and sitting for 5-10 minutes. No medication changes at this time. Heart healthy diet and regular cardiovascular exercise encouraged.   Dyslipidemia LDL from 1 year 64. Currently at goal. Continue Lipitor  and Zetia . Heart healthy diet and regular cardiovascular exercise encouraged. Plan to repeat FLP and LFT at next office visit if not obtained by PCP at next office visit.   Lower extremity edema Etiology most likely due to chronic venous insufficiency and also in setting of kidney stones. Will arrange Echo and lowe extremity venous reflex study. Continue Lasix  PRN. Low salt, heart healthy diet and regular cardiovascular exercise encouraged.       Dispo: Follow-up with MD/APP in 4-6 weeks or sooner if anything changes.   Signed, Almarie Crate, NP

## 2023-11-21 DIAGNOSIS — M25612 Stiffness of left shoulder, not elsewhere classified: Secondary | ICD-10-CM | POA: Diagnosis not present

## 2023-11-21 DIAGNOSIS — M25611 Stiffness of right shoulder, not elsewhere classified: Secondary | ICD-10-CM | POA: Diagnosis not present

## 2023-11-21 DIAGNOSIS — M25511 Pain in right shoulder: Secondary | ICD-10-CM | POA: Diagnosis not present

## 2023-11-21 DIAGNOSIS — M25512 Pain in left shoulder: Secondary | ICD-10-CM | POA: Diagnosis not present

## 2023-11-23 DIAGNOSIS — M25512 Pain in left shoulder: Secondary | ICD-10-CM | POA: Diagnosis not present

## 2023-11-23 DIAGNOSIS — M25612 Stiffness of left shoulder, not elsewhere classified: Secondary | ICD-10-CM | POA: Diagnosis not present

## 2023-11-23 DIAGNOSIS — M25611 Stiffness of right shoulder, not elsewhere classified: Secondary | ICD-10-CM | POA: Diagnosis not present

## 2023-11-23 DIAGNOSIS — M25511 Pain in right shoulder: Secondary | ICD-10-CM | POA: Diagnosis not present

## 2023-11-25 DIAGNOSIS — I2510 Atherosclerotic heart disease of native coronary artery without angina pectoris: Secondary | ICD-10-CM | POA: Diagnosis not present

## 2023-11-25 DIAGNOSIS — Z6835 Body mass index (BMI) 35.0-35.9, adult: Secondary | ICD-10-CM | POA: Diagnosis not present

## 2023-11-25 DIAGNOSIS — J849 Interstitial pulmonary disease, unspecified: Secondary | ICD-10-CM | POA: Diagnosis not present

## 2023-11-25 DIAGNOSIS — M5440 Lumbago with sciatica, unspecified side: Secondary | ICD-10-CM | POA: Diagnosis not present

## 2023-11-25 DIAGNOSIS — M0609 Rheumatoid arthritis without rheumatoid factor, multiple sites: Secondary | ICD-10-CM | POA: Diagnosis not present

## 2023-11-25 DIAGNOSIS — E669 Obesity, unspecified: Secondary | ICD-10-CM | POA: Diagnosis not present

## 2023-11-25 DIAGNOSIS — R7989 Other specified abnormal findings of blood chemistry: Secondary | ICD-10-CM | POA: Diagnosis not present

## 2023-11-28 DIAGNOSIS — M25511 Pain in right shoulder: Secondary | ICD-10-CM | POA: Diagnosis not present

## 2023-11-28 DIAGNOSIS — M25612 Stiffness of left shoulder, not elsewhere classified: Secondary | ICD-10-CM | POA: Diagnosis not present

## 2023-11-28 DIAGNOSIS — M25512 Pain in left shoulder: Secondary | ICD-10-CM | POA: Diagnosis not present

## 2023-11-28 DIAGNOSIS — M25611 Stiffness of right shoulder, not elsewhere classified: Secondary | ICD-10-CM | POA: Diagnosis not present

## 2023-12-02 DIAGNOSIS — M25512 Pain in left shoulder: Secondary | ICD-10-CM | POA: Diagnosis not present

## 2023-12-02 DIAGNOSIS — M25511 Pain in right shoulder: Secondary | ICD-10-CM | POA: Diagnosis not present

## 2023-12-02 DIAGNOSIS — M25611 Stiffness of right shoulder, not elsewhere classified: Secondary | ICD-10-CM | POA: Diagnosis not present

## 2023-12-02 DIAGNOSIS — M25612 Stiffness of left shoulder, not elsewhere classified: Secondary | ICD-10-CM | POA: Diagnosis not present

## 2023-12-05 DIAGNOSIS — M25512 Pain in left shoulder: Secondary | ICD-10-CM | POA: Diagnosis not present

## 2023-12-05 DIAGNOSIS — M25611 Stiffness of right shoulder, not elsewhere classified: Secondary | ICD-10-CM | POA: Diagnosis not present

## 2023-12-05 DIAGNOSIS — M25612 Stiffness of left shoulder, not elsewhere classified: Secondary | ICD-10-CM | POA: Diagnosis not present

## 2023-12-05 DIAGNOSIS — M25511 Pain in right shoulder: Secondary | ICD-10-CM | POA: Diagnosis not present

## 2023-12-09 DIAGNOSIS — M25611 Stiffness of right shoulder, not elsewhere classified: Secondary | ICD-10-CM | POA: Diagnosis not present

## 2023-12-09 DIAGNOSIS — M25612 Stiffness of left shoulder, not elsewhere classified: Secondary | ICD-10-CM | POA: Diagnosis not present

## 2023-12-09 DIAGNOSIS — M25512 Pain in left shoulder: Secondary | ICD-10-CM | POA: Diagnosis not present

## 2023-12-12 DIAGNOSIS — M25612 Stiffness of left shoulder, not elsewhere classified: Secondary | ICD-10-CM | POA: Diagnosis not present

## 2023-12-12 DIAGNOSIS — M25511 Pain in right shoulder: Secondary | ICD-10-CM | POA: Diagnosis not present

## 2023-12-12 DIAGNOSIS — M25512 Pain in left shoulder: Secondary | ICD-10-CM | POA: Diagnosis not present

## 2023-12-12 DIAGNOSIS — M25611 Stiffness of right shoulder, not elsewhere classified: Secondary | ICD-10-CM | POA: Diagnosis not present

## 2023-12-14 DIAGNOSIS — M7541 Impingement syndrome of right shoulder: Secondary | ICD-10-CM | POA: Diagnosis not present

## 2023-12-14 DIAGNOSIS — M7542 Impingement syndrome of left shoulder: Secondary | ICD-10-CM | POA: Diagnosis not present

## 2023-12-27 DIAGNOSIS — M25511 Pain in right shoulder: Secondary | ICD-10-CM | POA: Diagnosis not present

## 2024-01-05 ENCOUNTER — Telehealth: Payer: Self-pay

## 2024-01-05 ENCOUNTER — Telehealth: Payer: Self-pay | Admitting: Internal Medicine

## 2024-01-05 NOTE — Telephone Encounter (Signed)
° °  Name: Richard Simpson  DOB: 01/15/1971  MRN: [id number]  Primary Cardiologist: Diannah SHAUNNA Maywood, MD   Preoperative team, please contact this patient and set up a phone call appointment for further preoperative risk assessment. Please obtain consent and complete medication review. Thank you for your help.  I confirm that guidance regarding antiplatelet and oral anticoagulation therapy has been completed and, if necessary, noted below.  Ideally aspirin  should be continued without interruption, however if the bleeding risk is too great, aspirin  may be held for 5-7 days prior to surgery. Please resume aspirin  post operatively when it is felt to be safe from a bleeding standpoint.    I also confirmed the patient resides in the state of [place] . As per [facility] Medical Board telemedicine laws, the patient must reside in the state in which the provider is licensed.   Lum LITTIE Louis, NP 01/05/2024, 9:35 AM [facility]

## 2024-01-05 NOTE — Telephone Encounter (Addendum)
° °  Pre-operative Risk Assessment    Patient Name: RYAN OGBORN  DOB: [birth year] MRN: [id number]      Request for Surgical Clearance    Procedure:  Right Shoulder scope rotator repair  Date of Surgery:  Clearance TBD                                 Surgeon:  tbd Surgeon's Group or Practice Name:  [facility] Phone number:  n/a Fax number:  [phone]   Type of Clearance Requested:   - Medical  Please advised any medications that should be held    Type of Anesthesia:  General and Interscalene block   Additional requests/questions:  Please fax a copy of clearance to the surgeon's office.  Signed, Darryle GORMAN Glance   01/05/2024, 9:14 AM

## 2024-01-05 NOTE — Telephone Encounter (Signed)
 Pt scheduled for VV on 12/22

## 2024-01-05 NOTE — Telephone Encounter (Signed)
 ADDENDUM TO CLEARANCE: SURGEON IS DR. FONDA OLMSTED

## 2024-01-05 NOTE — Telephone Encounter (Signed)
°  Patient Consent for Virtual Visit        Richard Simpson has provided verbal consent on 01/05/2024 for a virtual visit (video or telephone).   CONSENT FOR VIRTUAL VISIT FOR:  Richard Simpson  By participating in this virtual visit I agree to the following:  I hereby voluntarily request, consent and authorize [facility] and its employed or contracted physicians, physician assistants, nurse practitioners or other licensed health care professionals (the Practitioner), to provide me with telemedicine health care services (the Services) as deemed necessary by the treating Practitioner. I acknowledge and consent to receive the Services by the Practitioner via telemedicine. I understand that the telemedicine visit will involve communicating with the Practitioner through live audiovisual communication technology and the disclosure of certain medical information by electronic transmission. I acknowledge that I have been given the opportunity to request an in-person assessment or other available alternative prior to the telemedicine visit and am voluntarily participating in the telemedicine visit.  I understand that I have the right to withhold or withdraw my consent to the use of telemedicine in the course of my care at any time, without affecting my right to future care or treatment, and that the Practitioner or I may terminate the telemedicine visit at any time. I understand that I have the right to inspect all information obtained and/or recorded in the course of the telemedicine visit and may receive copies of available information for a reasonable fee.  I understand that some of the potential risks of receiving the Services via telemedicine include:  Delay or interruption in medical evaluation due to technological equipment failure or disruption; Information transmitted may not be sufficient (e.g. poor resolution of images) to allow for appropriate medical decision making by the  Practitioner; and/or  In rare instances, security protocols could fail, causing a breach of personal health information.  Furthermore, I acknowledge that it is my responsibility to provide information about my medical history, conditions and care that is complete and accurate to the best of my ability. I acknowledge that Practitioner's advice, recommendations, and/or decision may be based on factors not within their control, such as incomplete or inaccurate data provided by me or distortions of diagnostic images or specimens that may result from electronic transmissions. I understand that the practice of medicine is not an exact science and that Practitioner makes no warranties or guarantees regarding treatment outcomes. I acknowledge that a copy of this consent can be made available to me via my patient portal [facility] MyChart), or I can request a printed copy by calling the office of [facility].    I understand that my insurance will be billed for this visit.   I have read or had this consent read to me. I understand the contents of this consent, which adequately explains the benefits and risks of the Services being provided via telemedicine.  I have been provided ample opportunity to ask questions regarding this consent and the Services and have had my questions answered to my satisfaction. I give my informed consent for the services to be provided through the use of telemedicine in my medical care

## 2024-01-06 ENCOUNTER — Telehealth: Payer: Self-pay | Admitting: Pulmonary Disease

## 2024-01-06 ENCOUNTER — Other Ambulatory Visit: Payer: Self-pay | Admitting: Internal Medicine

## 2024-01-06 NOTE — Telephone Encounter (Signed)
 Fax received from Dr. Josefina with [facility] to perform a right shoulder scope and rotator cuff repair on patient.  Patient needs surgery clearance. Surgery is pending. Patient was seen on 08/26/22. Office protocol is a risk assessment can be sent to surgeon if patient has been seen in 60 days or less.   I called and spoke with the pt and notified that he will need appt and have scheduled him with Candis for 01/10/24. Routing to clearance pool until visit is complete.

## 2024-01-09 MED ORDER — ATORVASTATIN CALCIUM 80 MG PO TABS: 80.0000 mg | ORAL_TABLET | Freq: Every day | ORAL | 3 refills | Status: AC

## 2024-01-09 NOTE — Progress Notes (Unsigned)
 @Patient  ID: Richard Simpson, male    DOB: Apr 24, 1970, 53 y.o.   MRN: [id number]  No chief complaint on file.   Referring provider: [facility]  HPI: Discussed the use of AI scribe software for clinical note transcription with the patient, who gave verbal consent to proceed.  History of Present Illness   Last OV 08/26/2022 Mathias): 53 yo  ex-smoker for follow-up of RA- ILD and OSA.   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 He remains on CellCept and low-dose Medrol 4 mg and arthritis is mostly in remission.  He has occasional flareup especially if he works a lot. Heat is making his breathing worse.  He has been using his albuterol  inhaler more in the summer. Wife wanted to check out his breathing. No skin rash, no cough or wheezing   He is compliant with his CPAP machine and denies any problems with mask or pressure     Significant tests/ events reviewed   PFTs 04/2021 showed FVC of 69%, FEV1 76%, ratio 86, TLC 79%, DLCO 87% PFTs 06/21/19- FEV1 3.91 (88%), ratio 88, DLCO 32.62 ( 99%) - stable      HRCT 05/2021 stable ILD  HRCT 06/18/19 minimal ground glass opacity significantly improved from 2018.   PFTs 10/2017 FVC 83%, DLCO 98%   HRCT 12/2016 >> worsening multifocal areas of ground-glass attenuation, most confluent in the left upper lobe, but with progressive involvement in the lower lobes of the lungs bilaterally as well.   06/25/2014 CT angio chest >> Patchy regions of ground-glass attenuation  opacity 15mm & 6cm in the medial aspect of the left upper lobe with associated volume loss 09/2014 CT chest >Decrease in size of ground-glass attenuating opacity within the perihilar left upper lobe compatible with a benign, likely infectious or inflammatory process.   Split NPSG  12/2016 -wt 275 pounds -  AHI 60/hour with lowest desaturation 75%.  This was corrected by CPAP 8 cm with nasal pillows   PFTs 12/2013 >>no obs,FVC 77%, nml DLCO -  Mild restriction ?effect of obesity    PFT 12/2016  moderate restriction . FEV1 70%, ratio 86, FVC 63%, DLCO 74%.   Allergies[1]  Immunization History  Administered Date(s) Administered   Influenza Split 10/25/2016   Influenza,inj,Quad PF,6+ Mos 11/20/2014, 11/04/2017   Influenza,inj,Quad PF,6-35 Mos 10/26/2018, 01/14/2021   Influenza-Unspecified 12/09/2013, 12/03/2014, 12/15/2015, 10/25/2021    Past Medical History:  Diagnosis Date   Anxiety    CAD S/P percutaneous coronary angioplasty 10/2013   a. 10/2013 Cath: mod LAD dzs->nl FFR-> medically managed;  b. 06/2014 NSTEMI/PCi: LAD 99ost, 70p, 56m (entire area covered with a 3.5x20 Synergy DES), LCX nl, RCA 99m, 10d, EF 45-50%. c. 10/2015: cath showing patent LAD stent with 15% Prox LAD stenosis. EF 55-65%.    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 06/2014 EF 45-50% by LV gram b. Echo July 2016: EF 60-65%. No RWMA. Gr 1 DD    NSTEMI (non-ST elevated myocardial infarction) (HCC) 06/2014   Obesity (BMI 30-39.9) 10/29/2013   Primary snoring 10/29/2013   Rheumatoid arthritis (HCC) 09/23/2013   RLS (restless legs syndrome) 10/29/2013   Sleep apnea     Tobacco History: Tobacco Use  History[2] Counseling given: Not Answered   Outpatient Medications Prior to Visit  Medication Sig Dispense Refill   amLODipine  (NORVASC ) 5 MG tablet Take 1 tablet (5 mg total) by mouth daily. 90 tablet 3   aspirin  EC 81 MG tablet Take 1 tablet (81 mg total) by mouth daily. 30 tablet 3   atorvastatin  (LIPITOR ) 80 MG tablet Take 1 tablet (80 mg total) by mouth daily. 90 tablet 3   clonazePAM  (KLONOPIN ) 1 MG tablet Take 1 mg by mouth 3 (three) times daily as needed for anxiety. Take 1mg  tablet by mouth scheduled at bedtime, may take an additional dose if needed for anxiety     clopidogrel  (PLAVIX ) 75 MG tablet Take 1 tablet (75 mg total) by mouth daily. 90 tablet 3   ezetimibe  (ZETIA ) 10 MG tablet Take 1 tablet (10 mg total) by mouth daily. 90 tablet 3   furosemide  (LASIX ) 20 MG tablet Take 20 mg by mouth daily as needed for fluid (leg swelling).     loratadine  (CLARITIN ) 10 MG tablet Take 10 mg by mouth daily.      losartan  (COZAAR ) 25 MG tablet Take 0.5 tablets (12.5 mg total) by mouth daily. Take in the evening 45 tablet 3   metoprolol  tartrate (LOPRESSOR ) 25 MG tablet Take 1.5 tablets (37.5 mg total) by mouth 2 (two) times daily. 270 tablet 3   mycophenolate (CELLCEPT) 500 MG tablet Take 1,000 mg by mouth 2 (two) times daily. Takes 2 tablets in the morning and 2 tablets in the evening (2,000 mg total)     nitroGLYCERIN  (NITROSTAT ) 0.4 MG SL tablet DISSOLVE 1 TABLET UNDER TONGUE FOR CHEST TIGHTNESS AND SHORTNESS OF BREATH. MAY TAKE EVERY 5 MINUTES UP TO 3 DOSES. IF NO RELIEF AFTER 3 DOS 25 tablet 0   ondansetron  (ZOFRAN ) 4 MG tablet Take 1 tablet (4 mg total) by mouth every 6 (six) hours. 12 tablet 0   pantoprazole  (PROTONIX ) 40 MG tablet Take 1 tablet (40 mg total) by mouth daily. 90 tablet 3   ranolazine  (RANEXA ) 500 MG 12 hr tablet Take 1,000 mg by mouth 2 (two) times daily.     tamsulosin  (FLOMAX ) 0.4 MG CAPS capsule Take 1 capsule (0.4 mg total) by mouth daily. 20 capsule 0   No  facility-administered medications prior to visit.     Review of Systems:   Constitutional:   No  weight loss, night sweats,  Fevers, chills, fatigue, or  lassitude.  HEENT:   No headaches,  Difficulty swallowing,  Tooth/dental problems, or  Sore throat,                No sneezing, itching, ear ache, nasal congestion, post nasal drip,   CV:  No chest pain,  Orthopnea, PND, swelling in lower extremities, anasarca, dizziness, palpitations, syncope.   GI  No heartburn, indigestion, abdominal pain, nausea, vomiting,  diarrhea, change in bowel habits, loss of appetite, bloody stools.   Resp: No shortness of breath with exertion or at rest.  No excess mucus, no productive cough,  No non-productive cough,  No coughing up of blood.  No change in color of mucus.  No wheezing.  No chest wall deformity  Skin: no rash or lesions.  GU: no dysuria, change in color of urine, no urgency or frequency.  No flank pain, no hematuria   MS:  No joint pain or swelling.  No decreased range of motion.  No back pain.    Physical Exam  There were no vitals taken for this visit.  GEN: A/Ox3; pleasant , NAD, well nourished    HEENT:  [place]/AT,  EACs-clear, TMs-wnl, NOSE-clear, THROAT-clear, no lesions, no postnasal drip or exudate noted.   NECK:  Supple w/ fair ROM; no JVD; normal carotid impulses w/o bruits; no thyromegaly or nodules palpated; no lymphadenopathy.    RESP  Clear  P & A; w/o, wheezes/ rales/ or rhonchi. no accessory muscle use, no dullness to percussion  CARD:  RRR, no m/r/g, no peripheral edema, pulses intact, no cyanosis or clubbing.  GI:   Soft & nt; nml bowel sounds; no organomegaly or masses detected.   Musco: Warm bil, no deformities or joint swelling noted.   Neuro: alert, no focal deficits noted.    Skin: Warm, no lesions or rashes    Lab Results:  CBC    Component Value Date/Time   WBC 7.8 10/12/2023 0849   RBC 4.52 10/12/2023 0849   HGB 15.2 10/12/2023 0849   HGB 16.7  12/12/2020 1058   HCT 44.6 10/12/2023 0849   HCT 48.3 12/12/2020 1058   PLT 154 10/12/2023 0849   PLT 193 12/12/2020 1058   MCV 98.7 10/12/2023 0849   MCV 93 12/12/2020 1058   MCH 33.6 10/12/2023 0849   MCHC 34.1 10/12/2023 0849   RDW 12.9 10/12/2023 0849   RDW 11.7 12/12/2020 1058   LYMPHSABS 1.0 10/23/2017 0752   LYMPHSABS 1.2 08/06/2014 0746   MONOABS 0.5 10/23/2017 0752   EOSABS 0.0 10/23/2017 0752   EOSABS 0.1 08/06/2014 0746   BASOSABS 0.0 10/23/2017 0752   BASOSABS 0.0 08/06/2014 0746    BMET    Component Value Date/Time   NA 141 10/12/2023 0849   NA 140 12/12/2020 1058   K 4.1 10/12/2023 0849   CL 106 10/12/2023 0849   CO2 23 10/12/2023 0849   GLUCOSE 136 (H) 10/12/2023 0849   BUN 18 10/12/2023 0849   BUN 14 12/12/2020 1058   CREATININE 1.12 10/12/2023 0849   CREATININE 1.04 11/11/2015 1607   CALCIUM  9.0 10/12/2023 0849   GFRNONAA >60 10/12/2023 0849   GFRNONAA 87 11/11/2015 1607   GFRAA >60 04/02/2019 0007   GFRAA >89 11/11/2015 1607    BNP    Component Value Date/Time   BNP 23.0 04/02/2019 0007    ProBNP    Component Value Date/Time   PROBNP 23 12/12/2020 1058   PROBNP 18.0 03/19/2015 1034    Imaging: No results found.  Administration History     None          Latest Ref Rng & Units 04/29/2021    8:50 AM 06/21/2019    8:52 AM 11/04/2017   11:40 AM 12/28/2016   12:19 PM 12/28/2013    3:23 PM  PFT Results  FVC-Pre L 3.88  4.32  4.40  P 3.59  4.20   FVC-Predicted Pre % 69  76  83  P 64  74   FVC-Post L 3.89  4.44   3.56  4.36   FVC-Predicted Post % 69  78   63  77   Pre FEV1/FVC % % 86  87  86  P 86  88   Post FEV1/FCV % % 89  88   86  90   FEV1-Pre L 3.34  3.75  3.79  P 3.10  3.68   FEV1-Predicted Pre % 76  85  92  P 71  82   FEV1-Post L 3.46  3.91   3.07  3.90   DLCO uncorrected ml/min/mmHg 28.44  32.62  33.22  P 26.45  31.22   DLCO UNC% % 87  99  99  P 74  88   DLCO corrected ml/min/mmHg 28.44  32.62  32.68  P    DLCO COR  %Predicted % 87  99  98  P    DLVA Predicted % 113  114  121  P 104  115   TLC L 6.03  6.49   4.66  5.66   TLC % Predicted % 79  85   63  76   RV % Predicted % 84  50   75  68     P Preliminary result    No results found for: NITRICOXIDE   Assessment & Plan:   Assessment & Plan    No follow-ups on file.  Candis Dandy, PA-C 01/09/2024      [1]  Allergies Allergen Reactions   Lisinopril  Cough   Prednisone  Other (See Comments)    Makes angry  [2]  Social History Tobacco Use  Smoking Status Former   Current packs/day: 0.00   Average packs/day: 0.3 packs/day for 15.0 years (3.8 ttl pk-yrs)   Types: Cigarettes   Start date: 09/03/1988   Quit date: 09/04/2003   Years since quitting: 20.3  Smokeless Tobacco Former   Types: Chew   Quit date: 2025

## 2024-01-10 ENCOUNTER — Encounter (HOSPITAL_BASED_OUTPATIENT_CLINIC_OR_DEPARTMENT_OTHER): Payer: Self-pay

## 2024-01-10 ENCOUNTER — Ambulatory Visit (INDEPENDENT_AMBULATORY_CARE_PROVIDER_SITE_OTHER)

## 2024-01-10 VITALS — BP 125/81 | HR 64 | Ht 73.0 in | Wt 267.0 lb

## 2024-01-10 DIAGNOSIS — G4733 Obstructive sleep apnea (adult) (pediatric): Secondary | ICD-10-CM

## 2024-01-10 DIAGNOSIS — J849 Interstitial pulmonary disease, unspecified: Secondary | ICD-10-CM

## 2024-01-10 DIAGNOSIS — Z008 Encounter for other general examination: Secondary | ICD-10-CM

## 2024-01-10 NOTE — Patient Instructions (Addendum)
 Low risk for pulmonary complications postoperatively per ARISCAT score:  Low risk 1.6% risk of in-hospital post-op pulmonary complications (composite including respiratory failure, respiratory infection, pleural effusion, atelectasis, pneumothorax, bronchospasm, aspiration pneumonitis)  Recommend early ambulation, coughing, deep breathing.  Plan for CPAP if staying overnight.      Continue CPAP nightly.

## 2024-01-12 NOTE — Telephone Encounter (Signed)
 [name]'s note 01/10/24 faxed to [facility] Fax confirmation received

## 2024-01-16 ENCOUNTER — Ambulatory Visit: Attending: Cardiology

## 2024-01-16 DIAGNOSIS — Z01810 Encounter for preprocedural cardiovascular examination: Secondary | ICD-10-CM | POA: Diagnosis not present

## 2024-01-16 NOTE — Progress Notes (Signed)
 "   Virtual Visit via Telephone Note   Because of Richard Simpson co-morbid illnesses, he is at least at moderate risk for complications without adequate follow up.  This format is felt to be most appropriate for this patient at this time.  Due to technical limitations with video connection (technology), today's appointment will be conducted as an audio only telehealth visit, and Richard Simpson verbally agreed to proceed in this manner.   All issues noted in this document were discussed and addressed.  No physical exam could be performed with this format.  Evaluation Performed:  Preoperative cardiovascular risk assessment _____________   Date:  01/16/2024   Patient ID:  Richard Simpson, DOB [birth year], MRN [id number] Patient Location:  Home Provider location:   Office  Primary Care Provider:  [facility] Primary Cardiologist:  Diannah SHAUNNA Maywood, MD  Chief Complaint / Patient Profile   53 y.o. y/o male with a h/o CAD, s/p DES to LAD 06/2014, dyslipidemia, HTN, ICM, hx of kidney stones, RA, ILD, sleep apnea, obesity, who is pending right shoulder scope rotator repair scheduled TBD and presents today for telephonic preoperative cardiovascular risk assessment.  History of Present Illness    Richard Simpson is a 53 y.o. male who presents via audio/video conferencing for a telehealth visit today.  Pt was last seen in cardiology clinic on 11/17/2023 by Almarie Crate NP.  At that time Richard Simpson was doing well. The patient is now pending procedure as outlined above. Since his last visit, He denies chest pain, palpitations, dyspnea, orthopnea, n, v,  dark/tarry/bloody stools, hematuria, dizziness, syncope, edema, weight gain. Recently passed a kidney stone, states symptoms related to that have resolved. He is a retired emergency planning/management officer who enjoys hunting, fishing, and working on old cars with his dad.   Past Medical History    Past Medical History:  Diagnosis Date   Anxiety     CAD S/P percutaneous coronary angioplasty 10/2013   a. 10/2013 Cath: mod LAD dzs->nl FFR-> medically managed;  b. 06/2014 NSTEMI/PCi: LAD 99ost, 70p, 39m (entire area covered with a 3.5x20 Synergy DES), LCX nl, RCA 53m, 10d, EF 45-50%. c. 10/2015: cath showing patent LAD stent with 15% Prox LAD stenosis. EF 55-65%.    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 06/2014 EF 45-50% by LV gram b. Echo July 2016: EF 60-65%. No RWMA. Gr 1 DD    NSTEMI (non-ST elevated myocardial infarction) (HCC) 06/2014   Obesity (BMI 30-39.9) 10/29/2013   Primary snoring 10/29/2013   Rheumatoid arthritis (HCC) 09/23/2013   RLS (restless legs syndrome) 10/29/2013   Sleep apnea    Past Surgical History:  Procedure Laterality Date   BIOPSY  09/27/2018   Procedure: BIOPSY;  Surgeon: Shaaron Lamar HERO, MD;  Location: [facility];  Service: Endoscopy;;   BIOPSY  04/13/2021   Procedure: BIOPSY;  Surgeon: Wilhelmenia Aloha Raddle., MD;  Location: [facility];  Service: Endoscopy;;   CARDIAC CATHETERIZATION N/A 07/05/2014   Procedure: Left Heart Cath and Coronary Angiography;  Surgeon: Debby DELENA Sor, MD;  Location: [facility];  Service: Cardiovascular;  NSTEMI --> Ostial LAd 99%, prox 80% & early mid 50% --> PCI   CARDIAC CATHETERIZATION  07/05/2014   Procedure: Coronary Stent Intervention;  Surgeon: Debby DELENA Sor, MD;  Location: [facility];  Service: Cardiovascular; Ostial-prox LAD tandem 99, 80 & 50% --> Synergy DES 3.5 x 20 (3.74)   CARDIAC CATHETERIZATION N/A 03/24/2015   Procedure: Left Heart Cath and Coronary Angiography;  Surgeon: Alm LELON Clay, MD;  Location: [facility] INVASIVE CV LAB;  Service:  Cardiovascular;  Laterality: N/A;   CARDIAC CATHETERIZATION N/A 11/20/2015   Procedure: Left Heart Cath and Coronary Angiography;  Surgeon: Debby DELENA Sor, MD;  Location: [facility];  Service: Cardiovascular;  Laterality: N/A;   CHOLECYSTECTOMY     10 yrs ago-jenkins   COLONOSCOPY N/A 03/23/2013   Procedure: COLONOSCOPY;  Surgeon: Lamar CHRISTELLA Hollingshead, MD;  [facility]: [facility];  Service: Endoscopy;  Laterality: N/A;  2:15-moved to 1030 Staff notified pt   COLONOSCOPY N/A 09/15/2023   Procedure: COLONOSCOPY;  Surgeon: Hollingshead Lamar CHRISTELLA, MD;  [facility]: [facility];  Service: Endoscopy;  Laterality: N/A;  11:30am, ok rm 1-2   COLONOSCOPY WITH PROPOFOL  N/A 06/10/2016   Procedure: COLONOSCOPY WITH PROPOFOL ;  Surgeon: Hollingshead Lamar CHRISTELLA, MD;  [facility]: [facility];  Service: Endoscopy;  Laterality: N/A;  730    ESOPHAGOGASTRODUODENOSCOPY N/A 04/13/2021   Procedure: ESOPHAGOGASTRODUODENOSCOPY (EGD);  Surgeon: Wilhelmenia Aloha Raddle., MD;  Location: [facility];  Service: Endoscopy;  Laterality: N/A;   ESOPHAGOGASTRODUODENOSCOPY (EGD) WITH PROPOFOL  N/A 09/27/2018   Procedure: ESOPHAGOGASTRODUODENOSCOPY (EGD) WITH PROPOFOL ;  Surgeon: Hollingshead Lamar CHRISTELLA, MD;  [facility]: [facility];  Service: Endoscopy;  Laterality: N/A;  1:30pm   EUS N/A 04/13/2021   Procedure: UPPER ENDOSCOPIC ULTRASOUND (EUS) RADIAL;  Surgeon: Wilhelmenia Aloha Raddle., MD;  Location: [facility];  Service: Endoscopy;  Laterality: N/A;   FRACTIONAL FLOW RESERVE WIRE  11/23/2013   Procedure: FRACTIONAL FLOW RESERVE WIRE;  Surgeon: Candyce GORMAN Reek, MD;  Location: [facility] CATH LAB;  Service: Cardiovascular;;   GALLBLADDER SURGERY     LEFT HEART CATHETERIZATION WITH CORONARY ANGIOGRAM N/A 11/23/2013   Procedure: LEFT HEART CATHETERIZATION WITH CORONARY ANGIOGRAM;  Surgeon: Candyce GORMAN Reek, MD;  Location: [facility] CATH LAB;  Service: Cardiovascular;  Laterality: N/A;   MASS EXCISION  09/09/2010   Procedure: EXCISION MASS;  Surgeon: Taft Minerva, MD;  Location: [facility];  Service: Orthopedics;  Laterality: Left;  Excision Mass Left Long Finger   POLYPECTOMY  06/10/2016   Procedure: POLYPECTOMY;  Surgeon: Hollingshead Lamar CHRISTELLA, MD;  Location: [facility];  Service: Endoscopy;;  colon   RIGHT/LEFT HEART CATH AND CORONARY ANGIOGRAPHY N/A 04/06/2019   Procedure: RIGHT/LEFT HEART CATH AND CORONARY ANGIOGRAPHY;  Surgeon: Jordan, Peter M, MD;  Location: [facility] INVASIVE CV LAB;  Service: Cardiovascular;  Laterality: N/A;   THROAT SURGERY     nodules removed from throat as child   TRANSTHORACIC ECHOCARDIOGRAM   July 2016:    EF 60-65%. No RWMA. Gr 1 DD    Allergies  Allergies[1]  Home Medications    Prior to Admission medications  Medication Sig Start Date End Date Taking? Authorizing Provider  amLODipine  (NORVASC ) 5 MG tablet Take 1 tablet (5 mg total) by mouth daily. 01/03/23   Sor Debby DELENA, MD  aspirin  EC 81 MG tablet Take 1 tablet (81 mg total) by mouth daily. 12/04/13   Army Katheryn BROCKS, NP  atorvastatin  (LIPITOR ) 80 MG tablet Take 1 tablet (80 mg total) by mouth daily.  01/09/24   Miriam Norris, NP  clonazePAM  (KLONOPIN ) 1 MG tablet Take 1 mg by mouth 3 (three) times daily as needed for anxiety. Take 1mg  tablet by mouth scheduled at bedtime, may take an additional dose if needed for anxiety    [provider]  clopidogrel  (PLAVIX ) 75 MG tablet Take 1 tablet (75 mg total) by mouth daily. 11/16/22   Burnard Debby LABOR, MD  ezetimibe  (ZETIA ) 10 MG tablet Take 1 tablet (10 mg total) by mouth daily. 11/16/22   Burnard Debby LABOR, MD  furosemide  (LASIX ) 20 MG tablet Take 20 mg by mouth daily as needed for fluid (leg swelling).    [provider]  loratadine  (CLARITIN ) 10 MG tablet Take 10 mg by mouth daily.     [provider]  losartan  (COZAAR ) 25 MG tablet Take 0.5 tablets (12.5 mg total) by mouth daily. Take in the evening 11/16/22   Burnard Debby LABOR, MD  metoprolol  tartrate (LOPRESSOR ) 25 MG tablet Take 1.5 tablets  (37.5 mg total) by mouth 2 (two) times daily. 11/16/22   Burnard Debby LABOR, MD  mycophenolate (CELLCEPT) 500 MG tablet Take 1,000 mg by mouth 2 (two) times daily. Takes 2 tablets in the morning and 2 tablets in the evening (2,000 mg total) 04/28/17   [provider]  nitroGLYCERIN  (NITROSTAT ) 0.4 MG SL tablet DISSOLVE 1 TABLET UNDER TONGUE FOR CHEST TIGHTNESS AND SHORTNESS OF BREATH. MAY TAKE EVERY 5 MINUTES UP TO 3 DOSES. IF NO RELIEF AFTER 3 DOS 12/10/20   Burnard Debby LABOR, MD  ondansetron  (ZOFRAN ) 4 MG tablet Take 1 tablet (4 mg total) by mouth every 6 (six) hours. 10/12/23   Triplett, Tammy, PA-C  pantoprazole  (PROTONIX ) 40 MG tablet Take 1 tablet (40 mg total) by mouth daily. 08/16/23   [name], Lamar HERO, MD  ranolazine  (RANEXA ) 500 MG 12 hr tablet Take 1,000 mg by mouth 2 (two) times daily.    [provider]    Physical Exam    Vital Signs:  Richard Simpson does not have vital signs available for review today.  Given telephonic nature of communication, physical exam is limited. AAOx3. NAD. Normal affect.  Speech and respirations are unlabored.  Accessory Clinical Findings    None  Assessment & Plan    1.  Preoperative Cardiovascular Risk Assessment: According to the Revised Cardiac Risk Index (RCRI), his Perioperative Risk of Major Cardiac Event is (%): 6.6  His Functional Capacity in METs is: 5.07 according to the Duke Activity Status Index (DASI). Therefore, based on ACC/AHA guidelines, patient would be at acceptable risk for the planned procedure without further cardiovascular testing. I will route this recommendation to the requesting party via Epic fax function.  The patient was advised that if he develops new symptoms prior to surgery to contact our office to arrange for a follow-up visit, and he verbalized understanding.  Per office protocol, if patient is without any new symptoms or concerns at the time of their virtual visit, he/she may hold Plavix  for 5 days  prior to procedure. Please resume Plavix  as soon as possible postprocedure, at the discretion of the surgeon.   Ideally aspirin  should be continued without interruption, however if the bleeding risk is too great, aspirin  may be held for 5-7 days prior to surgery. Please resume aspirin  post operatively when it is felt to be safe from a bleeding standpoint.   A copy of this note will be routed to requesting surgeon.  Time:   Today, I have  spent 10 minutes with the patient with telehealth technology discussing medical history, symptoms, and management plan.     [place] Devine E Chaunta Bejarano, NP  01/16/2024, 7:39 AM     [1]  Allergies Allergen Reactions   Lisinopril  Cough   Prednisone  Other (See Comments)    Makes angry   "

## 2024-02-06 ENCOUNTER — Other Ambulatory Visit: Payer: Self-pay

## 2024-02-06 MED ORDER — METOPROLOL TARTRATE 25 MG PO TABS: 37.0000 mg | ORAL_TABLET | Freq: Two times a day (BID) | ORAL | 3 refills | Status: AC

## 2024-02-17 MED ORDER — EZETIMIBE 10 MG PO TABS: 10.0000 mg | ORAL_TABLET | Freq: Every day | ORAL | 2 refills | Status: AC

## 2024-02-17 MED ORDER — CLOPIDOGREL BISULFATE 75 MG PO TABS: 75.0000 mg | ORAL_TABLET | Freq: Every day | ORAL | 2 refills | Status: AC

## 2024-02-17 MED ORDER — LOSARTAN POTASSIUM 25 MG PO TABS: 12.5000 mg | ORAL_TABLET | Freq: Every day | ORAL | 2 refills | Status: AC

## 2024-02-17 MED ORDER — RANOLAZINE ER 500 MG PO TB12: 1000.0000 mg | ORAL_TABLET | Freq: Two times a day (BID) | ORAL | 2 refills | Status: AC

## 2024-02-17 NOTE — Telephone Encounter (Signed)
 Pt had labs within 365 days, within normal levels, ALT and AST on 11/25/2023 in [facility] and lipid panel done with Dr. Jerel Sieving on 08/17/2023. All within 365 days. Medications sent to pt's pharmacy. Confirmation received.
